# Patient Record
Sex: Female | Born: 1940 | Race: Black or African American | Hispanic: No | Marital: Single | Smoking: Former smoker
Health system: Southern US, Community
[De-identification: ages and names within clinical notes are randomized; demographics above are authoritative.]

## PROBLEM LIST (undated history)

## (undated) DIAGNOSIS — N189 Chronic kidney disease, unspecified: Secondary | ICD-10-CM

## (undated) DIAGNOSIS — I509 Heart failure, unspecified: Secondary | ICD-10-CM

## (undated) DIAGNOSIS — I10 Essential (primary) hypertension: Secondary | ICD-10-CM

## (undated) DIAGNOSIS — R0989 Other specified symptoms and signs involving the circulatory and respiratory systems: Secondary | ICD-10-CM

## (undated) DIAGNOSIS — J189 Pneumonia, unspecified organism: Secondary | ICD-10-CM

## (undated) DIAGNOSIS — E785 Hyperlipidemia, unspecified: Secondary | ICD-10-CM

## (undated) DIAGNOSIS — E559 Vitamin D deficiency, unspecified: Secondary | ICD-10-CM

## (undated) DIAGNOSIS — I350 Nonrheumatic aortic (valve) stenosis: Secondary | ICD-10-CM

## (undated) DIAGNOSIS — K219 Gastro-esophageal reflux disease without esophagitis: Secondary | ICD-10-CM

## (undated) DIAGNOSIS — R011 Cardiac murmur, unspecified: Secondary | ICD-10-CM

## (undated) DIAGNOSIS — E119 Type 2 diabetes mellitus without complications: Secondary | ICD-10-CM

## (undated) HISTORY — PX: CORONARY ANGIOPLASTY: SHX604

## (undated) HISTORY — PX: COLONOSCOPY: SHX174

## (undated) HISTORY — DX: Chronic kidney disease, unspecified: N18.9

## (undated) HISTORY — DX: Heart failure, unspecified: I50.9

## (undated) HISTORY — PX: OTHER SURGICAL HISTORY: SHX169

---

## 2004-11-13 ENCOUNTER — Ambulatory Visit: Payer: Self-pay | Admitting: *Deleted

## 2005-01-19 ENCOUNTER — Emergency Department: Payer: Self-pay | Admitting: Emergency Medicine

## 2006-05-10 ENCOUNTER — Ambulatory Visit: Payer: Self-pay | Admitting: *Deleted

## 2006-09-12 ENCOUNTER — Ambulatory Visit: Payer: Self-pay | Admitting: *Deleted

## 2006-12-17 ENCOUNTER — Emergency Department: Payer: Self-pay | Admitting: Emergency Medicine

## 2006-12-22 ENCOUNTER — Ambulatory Visit: Payer: Self-pay | Admitting: *Deleted

## 2007-06-07 ENCOUNTER — Ambulatory Visit: Payer: Self-pay | Admitting: Gastroenterology

## 2007-08-29 ENCOUNTER — Ambulatory Visit: Payer: Self-pay | Admitting: *Deleted

## 2008-10-01 ENCOUNTER — Ambulatory Visit: Payer: Self-pay | Admitting: Family Medicine

## 2009-10-02 ENCOUNTER — Ambulatory Visit: Payer: Self-pay | Admitting: Family Medicine

## 2013-05-25 ENCOUNTER — Emergency Department: Payer: Self-pay | Admitting: Emergency Medicine

## 2014-07-24 ENCOUNTER — Ambulatory Visit: Payer: Self-pay | Admitting: Family Medicine

## 2015-02-16 ENCOUNTER — Emergency Department: Payer: Self-pay | Admitting: Emergency Medicine

## 2015-06-24 ENCOUNTER — Other Ambulatory Visit: Payer: Self-pay | Admitting: Nurse Practitioner

## 2015-06-24 DIAGNOSIS — Z1231 Encounter for screening mammogram for malignant neoplasm of breast: Secondary | ICD-10-CM

## 2015-06-25 ENCOUNTER — Other Ambulatory Visit: Payer: Self-pay | Admitting: Nurse Practitioner

## 2015-06-25 ENCOUNTER — Ambulatory Visit
Admission: RE | Admit: 2015-06-25 | Discharge: 2015-06-25 | Disposition: A | Discharge status: Home / Self Care | Payer: Medicare HMO | Source: Ambulatory Visit | Attending: Nurse Practitioner | Admitting: Nurse Practitioner

## 2015-06-25 DIAGNOSIS — R0989 Other specified symptoms and signs involving the circulatory and respiratory systems: Secondary | ICD-10-CM

## 2015-06-25 DIAGNOSIS — Z1231 Encounter for screening mammogram for malignant neoplasm of breast: Secondary | ICD-10-CM | POA: Diagnosis not present

## 2015-07-01 ENCOUNTER — Ambulatory Visit
Admission: RE | Admit: 2015-07-01 | Discharge: 2015-07-01 | Disposition: A | Discharge status: Home / Self Care | Payer: Medicare HMO | Source: Ambulatory Visit | Attending: Nurse Practitioner | Admitting: Nurse Practitioner

## 2015-07-01 DIAGNOSIS — R0989 Other specified symptoms and signs involving the circulatory and respiratory systems: Secondary | ICD-10-CM | POA: Diagnosis present

## 2015-07-01 DIAGNOSIS — I6523 Occlusion and stenosis of bilateral carotid arteries: Secondary | ICD-10-CM | POA: Insufficient documentation

## 2015-07-24 DIAGNOSIS — K219 Gastro-esophageal reflux disease without esophagitis: Secondary | ICD-10-CM | POA: Insufficient documentation

## 2015-07-24 DIAGNOSIS — E785 Hyperlipidemia, unspecified: Secondary | ICD-10-CM | POA: Insufficient documentation

## 2015-07-24 DIAGNOSIS — E1129 Type 2 diabetes mellitus with other diabetic kidney complication: Secondary | ICD-10-CM | POA: Insufficient documentation

## 2015-07-24 DIAGNOSIS — E1165 Type 2 diabetes mellitus with hyperglycemia: Secondary | ICD-10-CM | POA: Insufficient documentation

## 2015-08-01 ENCOUNTER — Encounter: Payer: Self-pay | Admitting: *Deleted

## 2015-08-04 ENCOUNTER — Encounter: Admission: RE | Payer: Self-pay | Source: Ambulatory Visit

## 2015-08-04 ENCOUNTER — Encounter: Payer: Self-pay | Admitting: Registered Nurse

## 2015-08-04 ENCOUNTER — Ambulatory Visit: Admission: RE | Admit: 2015-08-04 | Payer: Medicare HMO | Source: Ambulatory Visit | Admitting: Gastroenterology

## 2015-08-04 HISTORY — DX: Type 2 diabetes mellitus without complications: E11.9

## 2015-08-04 HISTORY — DX: Gastro-esophageal reflux disease without esophagitis: K21.9

## 2015-08-04 HISTORY — DX: Essential (primary) hypertension: I10

## 2015-08-04 HISTORY — DX: Hyperlipidemia, unspecified: E78.5

## 2015-08-04 HISTORY — DX: Vitamin D deficiency, unspecified: E55.9

## 2015-08-04 SURGERY — COLONOSCOPY WITH PROPOFOL
Anesthesia: General

## 2017-01-19 ENCOUNTER — Other Ambulatory Visit: Payer: Self-pay | Admitting: Nurse Practitioner

## 2017-01-19 DIAGNOSIS — Z13820 Encounter for screening for osteoporosis: Secondary | ICD-10-CM

## 2017-01-27 ENCOUNTER — Ambulatory Visit
Admission: RE | Admit: 2017-01-27 | Discharge: 2017-01-27 | Disposition: A | Discharge status: Home / Self Care | Payer: Medicare HMO | Source: Ambulatory Visit | Attending: Nurse Practitioner | Admitting: Nurse Practitioner

## 2017-01-27 DIAGNOSIS — Z13820 Encounter for screening for osteoporosis: Secondary | ICD-10-CM | POA: Insufficient documentation

## 2018-08-05 ENCOUNTER — Emergency Department: Payer: Medicare HMO

## 2018-08-05 ENCOUNTER — Inpatient Hospital Stay
Admission: EM | Admit: 2018-08-05 | Discharge: 2018-08-11 | DRG: 871 | Disposition: A | Discharge status: Home / Self Care | Payer: Medicare HMO | Attending: Internal Medicine | Admitting: Internal Medicine

## 2018-08-05 ENCOUNTER — Inpatient Hospital Stay: Payer: Medicare HMO

## 2018-08-05 ENCOUNTER — Other Ambulatory Visit: Payer: Self-pay

## 2018-08-05 DIAGNOSIS — B9620 Unspecified Escherichia coli [E. coli] as the cause of diseases classified elsewhere: Secondary | ICD-10-CM | POA: Diagnosis present

## 2018-08-05 DIAGNOSIS — F17210 Nicotine dependence, cigarettes, uncomplicated: Secondary | ICD-10-CM | POA: Diagnosis present

## 2018-08-05 DIAGNOSIS — J9622 Acute and chronic respiratory failure with hypercapnia: Secondary | ICD-10-CM | POA: Diagnosis present

## 2018-08-05 DIAGNOSIS — E876 Hypokalemia: Secondary | ICD-10-CM | POA: Diagnosis present

## 2018-08-05 DIAGNOSIS — N390 Urinary tract infection, site not specified: Secondary | ICD-10-CM | POA: Diagnosis present

## 2018-08-05 DIAGNOSIS — J181 Lobar pneumonia, unspecified organism: Secondary | ICD-10-CM

## 2018-08-05 DIAGNOSIS — I110 Hypertensive heart disease with heart failure: Secondary | ICD-10-CM | POA: Diagnosis present

## 2018-08-05 DIAGNOSIS — Z794 Long term (current) use of insulin: Secondary | ICD-10-CM | POA: Diagnosis not present

## 2018-08-05 DIAGNOSIS — A419 Sepsis, unspecified organism: Principal | ICD-10-CM

## 2018-08-05 DIAGNOSIS — J9621 Acute and chronic respiratory failure with hypoxia: Secondary | ICD-10-CM | POA: Diagnosis present

## 2018-08-05 DIAGNOSIS — Z7982 Long term (current) use of aspirin: Secondary | ICD-10-CM | POA: Diagnosis not present

## 2018-08-05 DIAGNOSIS — E872 Acidosis: Secondary | ICD-10-CM | POA: Diagnosis present

## 2018-08-05 DIAGNOSIS — E785 Hyperlipidemia, unspecified: Secondary | ICD-10-CM | POA: Diagnosis present

## 2018-08-05 DIAGNOSIS — Z79899 Other long term (current) drug therapy: Secondary | ICD-10-CM

## 2018-08-05 DIAGNOSIS — K219 Gastro-esophageal reflux disease without esophagitis: Secondary | ICD-10-CM | POA: Diagnosis present

## 2018-08-05 DIAGNOSIS — K8020 Calculus of gallbladder without cholecystitis without obstruction: Secondary | ICD-10-CM | POA: Diagnosis present

## 2018-08-05 DIAGNOSIS — J810 Acute pulmonary edema: Secondary | ICD-10-CM | POA: Diagnosis not present

## 2018-08-05 DIAGNOSIS — J9601 Acute respiratory failure with hypoxia: Secondary | ICD-10-CM

## 2018-08-05 DIAGNOSIS — J690 Pneumonitis due to inhalation of food and vomit: Secondary | ICD-10-CM | POA: Diagnosis present

## 2018-08-05 DIAGNOSIS — R0602 Shortness of breath: Secondary | ICD-10-CM | POA: Diagnosis present

## 2018-08-05 DIAGNOSIS — Z4659 Encounter for fitting and adjustment of other gastrointestinal appliance and device: Secondary | ICD-10-CM

## 2018-08-05 DIAGNOSIS — R1110 Vomiting, unspecified: Secondary | ICD-10-CM

## 2018-08-05 DIAGNOSIS — Z978 Presence of other specified devices: Secondary | ICD-10-CM

## 2018-08-05 DIAGNOSIS — N179 Acute kidney failure, unspecified: Secondary | ICD-10-CM | POA: Diagnosis present

## 2018-08-05 DIAGNOSIS — J441 Chronic obstructive pulmonary disease with (acute) exacerbation: Secondary | ICD-10-CM | POA: Diagnosis present

## 2018-08-05 DIAGNOSIS — I5033 Acute on chronic diastolic (congestive) heart failure: Secondary | ICD-10-CM | POA: Diagnosis present

## 2018-08-05 DIAGNOSIS — J189 Pneumonia, unspecified organism: Secondary | ICD-10-CM

## 2018-08-05 DIAGNOSIS — I248 Other forms of acute ischemic heart disease: Secondary | ICD-10-CM | POA: Diagnosis present

## 2018-08-05 DIAGNOSIS — D649 Anemia, unspecified: Secondary | ICD-10-CM | POA: Diagnosis present

## 2018-08-05 DIAGNOSIS — R7981 Abnormal blood-gas level: Secondary | ICD-10-CM

## 2018-08-05 DIAGNOSIS — E119 Type 2 diabetes mellitus without complications: Secondary | ICD-10-CM | POA: Diagnosis present

## 2018-08-05 DIAGNOSIS — K922 Gastrointestinal hemorrhage, unspecified: Secondary | ICD-10-CM | POA: Diagnosis present

## 2018-08-05 DIAGNOSIS — J9600 Acute respiratory failure, unspecified whether with hypoxia or hypercapnia: Secondary | ICD-10-CM

## 2018-08-05 DIAGNOSIS — R6521 Severe sepsis with septic shock: Secondary | ICD-10-CM | POA: Diagnosis present

## 2018-08-05 DIAGNOSIS — Z95828 Presence of other vascular implants and grafts: Secondary | ICD-10-CM

## 2018-08-05 DIAGNOSIS — Z01818 Encounter for other preprocedural examination: Secondary | ICD-10-CM

## 2018-08-05 DIAGNOSIS — J9690 Respiratory failure, unspecified, unspecified whether with hypoxia or hypercapnia: Secondary | ICD-10-CM

## 2018-08-05 LAB — BLOOD GAS, ARTERIAL
Acid-base deficit: 16.6 mmol/L — ABNORMAL HIGH (ref 0.0–2.0)
Acid-base deficit: 13.7 mmol/L — ABNORMAL HIGH (ref 0.0–2.0)
Bicarbonate: 16 mmol/L — ABNORMAL LOW (ref 20.0–28.0)
Bicarbonate: 15.6 mmol/L — ABNORMAL LOW (ref 20.0–28.0)
Delivery systems: POSITIVE
Expiratory PAP: 5
FIO2: 100
FIO2: 50
Inspiratory PAP: 10
MECHVT: 450 mL
O2 Saturation: 92.1 %
O2 Saturation: 92.7 %
PEEP: 5 cmH2O
Patient temperature: 37
Patient temperature: 35.4
RATE: 20 resp/min
pCO2 arterial: 68 mmHg (ref 32.0–48.0)
pCO2 arterial: 49 mmHg — ABNORMAL HIGH (ref 32.0–48.0)
pH, Arterial: 6.98 — CL (ref 7.350–7.450)
pH, Arterial: 7.13 — CL (ref 7.350–7.450)
pO2, Arterial: 97 mmHg (ref 83.0–108.0)
pO2, Arterial: 79 mmHg — ABNORMAL LOW (ref 83.0–108.0)

## 2018-08-05 LAB — URINALYSIS, COMPLETE (UACMP) WITH MICROSCOPIC
Bilirubin Urine: NEGATIVE
Glucose, UA: 50 mg/dL — AB
Hgb urine dipstick: NEGATIVE
Ketones, ur: NEGATIVE mg/dL
Leukocytes, UA: NEGATIVE
Nitrite: NEGATIVE
Protein, ur: >=300 mg/dL — AB
Specific Gravity, Urine: 1.007 (ref 1.005–1.030)
pH: 6 (ref 5.0–8.0)

## 2018-08-05 LAB — CBC WITH DIFFERENTIAL/PLATELET
Basophils Absolute: 0.2 10*3/uL — ABNORMAL HIGH (ref 0–0.1)
Basophils Relative: 1 %
Eosinophils Absolute: 0.7 10*3/uL (ref 0–0.7)
Eosinophils Relative: 3 %
HCT: 43.2 % (ref 35.0–47.0)
Hemoglobin: 14.3 g/dL (ref 12.0–16.0)
Lymphocytes Relative: 50 %
Lymphs Abs: 11.3 10*3/uL — ABNORMAL HIGH (ref 1.0–3.6)
MCH: 28 pg (ref 26.0–34.0)
MCHC: 33.1 g/dL (ref 32.0–36.0)
MCV: 84.6 fL (ref 80.0–100.0)
Monocytes Absolute: 1.4 10*3/uL — ABNORMAL HIGH (ref 0.2–0.9)
Monocytes Relative: 6 %
Neutro Abs: 9 10*3/uL — ABNORMAL HIGH (ref 1.4–6.5)
Neutrophils Relative %: 40 %
Platelets: 461 10*3/uL — ABNORMAL HIGH (ref 150–440)
RBC: 5.11 MIL/uL (ref 3.80–5.20)
RDW: 15.3 % — ABNORMAL HIGH (ref 11.5–14.5)
WBC: 22.6 10*3/uL — ABNORMAL HIGH (ref 3.6–11.0)

## 2018-08-05 LAB — COMPREHENSIVE METABOLIC PANEL
ALT: 22 U/L (ref 0–44)
AST: 43 U/L — ABNORMAL HIGH (ref 15–41)
Albumin: 3.5 g/dL (ref 3.5–5.0)
Alkaline Phosphatase: 64 U/L (ref 38–126)
Anion gap: 14 (ref 5–15)
BUN: 14 mg/dL (ref 8–23)
CO2: 19 mmol/L — ABNORMAL LOW (ref 22–32)
Calcium: 9 mg/dL (ref 8.9–10.3)
Chloride: 104 mmol/L (ref 98–111)
Creatinine, Ser: 1.16 mg/dL — ABNORMAL HIGH (ref 0.44–1.00)
GFR calc Af Amer: 51 mL/min — ABNORMAL LOW (ref >60)
GFR calc non Af Amer: 44 mL/min — ABNORMAL LOW (ref >60)
Glucose, Bld: 405 mg/dL — ABNORMAL HIGH (ref 70–99)
Potassium: 3.7 mmol/L (ref 3.5–5.1)
Sodium: 137 mmol/L (ref 135–145)
Total Bilirubin: 0.7 mg/dL (ref 0.3–1.2)
Total Protein: 7.5 g/dL (ref 6.5–8.1)

## 2018-08-05 LAB — PROCALCITONIN: Procalcitonin: <0.1 ng/mL

## 2018-08-05 LAB — GLUCOSE, CAPILLARY: Glucose-Capillary: 313 mg/dL — ABNORMAL HIGH (ref 70–99)

## 2018-08-05 LAB — LIPASE, BLOOD: Lipase: 40 U/L (ref 11–51)

## 2018-08-05 LAB — PROTIME-INR
INR: 1.03
Prothrombin Time: 13.4 seconds (ref 11.4–15.2)

## 2018-08-05 LAB — MRSA PCR SCREENING: MRSA by PCR: NEGATIVE

## 2018-08-05 LAB — LACTIC ACID, PLASMA
Lactic Acid, Venous: 7.2 mmol/L (ref 0.5–1.9)
Lactic Acid, Venous: 3.4 mmol/L (ref 0.5–1.9)

## 2018-08-05 LAB — TROPONIN I
Troponin I: 0.04 ng/mL (ref <0.03)
Troponin I: 0.27 ng/mL (ref <0.03)
Troponin I: 0.73 ng/mL (ref <0.03)

## 2018-08-05 LAB — INFLUENZA PANEL BY PCR (TYPE A & B)
Influenza A By PCR: NEGATIVE
Influenza B By PCR: NEGATIVE

## 2018-08-05 LAB — APTT: aPTT: 31 seconds (ref 24–36)

## 2018-08-05 MED ORDER — PANTOPRAZOLE SODIUM 40 MG PO TBEC
40.0000 mg | DELAYED_RELEASE_TABLET | Freq: Every day | ORAL | Status: DC
  Filled 2018-08-05: qty 1

## 2018-08-05 MED ORDER — LORATADINE 10 MG PO TABS
10.0000 mg | ORAL_TABLET | Freq: Every day | ORAL | Status: DC
Start: 2018-08-05 — End: 2018-08-07
  Filled 2018-08-05 (×2): qty 1

## 2018-08-05 MED ORDER — SODIUM BICARBONATE 8.4 % IV SOLN
25.0000 meq | Freq: Once | INTRAVENOUS | Status: AC
  Administered 2018-08-05: 25 meq via INTRAVENOUS
  Filled 2018-08-05: qty 50

## 2018-08-05 MED ORDER — VANCOMYCIN HCL IN DEXTROSE 1-5 GM/200ML-% IV SOLN
1000.0000 mg | Freq: Once | INTRAVENOUS | Status: AC
  Administered 2018-08-05: 1000 mg via INTRAVENOUS
  Filled 2018-08-05: qty 200

## 2018-08-05 MED ORDER — BUDESONIDE 0.5 MG/2ML IN SUSP
0.5000 mg | Freq: Two times a day (BID) | RESPIRATORY_TRACT | Status: DC
  Administered 2018-08-05 – 2018-08-07 (×4): 0.5 mg via RESPIRATORY_TRACT
  Filled 2018-08-05 (×4): qty 2

## 2018-08-05 MED ORDER — MAGNESIUM SULFATE 2 GM/50ML IV SOLN
2.0000 g | INTRAVENOUS | Status: AC
  Administered 2018-08-05: 2 g via INTRAVENOUS

## 2018-08-05 MED ORDER — METHYLPREDNISOLONE SODIUM SUCC 125 MG IJ SOLR
125.0000 mg | Freq: Once | INTRAMUSCULAR | Status: AC
  Administered 2018-08-05: 125 mg via INTRAVENOUS

## 2018-08-05 MED ORDER — PIPERACILLIN-TAZOBACTAM 3.375 G IVPB 30 MIN
3.3750 g | Freq: Once | INTRAVENOUS | Status: AC
  Administered 2018-08-05: 3.375 g via INTRAVENOUS
  Filled 2018-08-05: qty 50

## 2018-08-05 MED ORDER — PHENYLEPHRINE HCL-NACL 10-0.9 MG/250ML-% IV SOLN
0.0000 ug/min | INTRAVENOUS | Status: DC
  Administered 2018-08-05 – 2018-08-06 (×2): 20 ug/min via INTRAVENOUS
  Filled 2018-08-05 (×4): qty 250

## 2018-08-05 MED ORDER — METHYLPREDNISOLONE SODIUM SUCC 40 MG IJ SOLR
40.0000 mg | Freq: Three times a day (TID) | INTRAMUSCULAR | Status: DC
  Administered 2018-08-05 – 2018-08-06 (×2): 40 mg via INTRAVENOUS
  Filled 2018-08-05 (×2): qty 1

## 2018-08-05 MED ORDER — ALBUTEROL SULFATE (2.5 MG/3ML) 0.083% IN NEBU
5.0000 mg | INHALATION_SOLUTION | Freq: Once | RESPIRATORY_TRACT | Status: AC
  Administered 2018-08-05: 5 mg via RESPIRATORY_TRACT

## 2018-08-05 MED ORDER — SODIUM CHLORIDE 0.9 % IV BOLUS (SEPSIS)
1000.0000 mL | Freq: Once | INTRAVENOUS | Status: AC
  Administered 2018-08-05: 1000 mL via INTRAVENOUS

## 2018-08-05 MED ORDER — METHYLPREDNISOLONE SODIUM SUCC 40 MG IJ SOLR: 40.0000 mg | Freq: Four times a day (QID) | INTRAMUSCULAR | Status: DC

## 2018-08-05 MED ORDER — ORAL CARE MOUTH RINSE
15.0000 mL | OROMUCOSAL | Status: DC
  Administered 2018-08-05 – 2018-08-07 (×15): 15 mL via OROMUCOSAL

## 2018-08-05 MED ORDER — PROPOFOL 1000 MG/100ML IV EMUL
5.0000 ug/kg/min | INTRAVENOUS | Status: DC
  Administered 2018-08-05 – 2018-08-06 (×3): 30 ug/kg/min via INTRAVENOUS
  Administered 2018-08-06: 40 ug/kg/min via INTRAVENOUS
  Filled 2018-08-05 (×4): qty 100

## 2018-08-05 MED ORDER — FENTANYL CITRATE (PF) 100 MCG/2ML IJ SOLN
100.0000 ug | Freq: Once | INTRAMUSCULAR | Status: AC
  Administered 2018-08-05: 100 ug via INTRAVENOUS
  Filled 2018-08-05: qty 2

## 2018-08-05 MED ORDER — ATORVASTATIN CALCIUM 20 MG PO TABS
40.0000 mg | ORAL_TABLET | Freq: Every day | ORAL | Status: DC
  Filled 2018-08-05 (×2): qty 2

## 2018-08-05 MED ORDER — FENTANYL 2500MCG IN NS 250ML (10MCG/ML) PREMIX INFUSION
0.0000 ug/h | INTRAVENOUS | Status: DC
  Administered 2018-08-05: 50 ug/h via INTRAVENOUS
  Administered 2018-08-06: 150 ug/h via INTRAVENOUS
  Filled 2018-08-05 (×3): qty 250

## 2018-08-05 MED ORDER — ASPIRIN EC 81 MG PO TBEC
81.0000 mg | DELAYED_RELEASE_TABLET | Freq: Every day | ORAL | Status: DC
  Filled 2018-08-05: qty 1

## 2018-08-05 MED ORDER — PIPERACILLIN-TAZOBACTAM 3.375 G IVPB
3.3750 g | Freq: Three times a day (TID) | INTRAVENOUS | Status: DC
  Administered 2018-08-05 – 2018-08-08 (×10): 3.375 g via INTRAVENOUS
  Filled 2018-08-05 (×7): qty 50

## 2018-08-05 MED ORDER — IOPAMIDOL (ISOVUE-370) INJECTION 76%
75.0000 mL | Freq: Once | INTRAVENOUS | Status: AC | PRN
  Administered 2018-08-05: 75 mL via INTRAVENOUS

## 2018-08-05 MED ORDER — ACETAMINOPHEN 650 MG RE SUPP: 650.0000 mg | Freq: Four times a day (QID) | RECTAL | Status: DC | PRN

## 2018-08-05 MED ORDER — MAGNESIUM SULFATE 2 GM/50ML IV SOLN
INTRAVENOUS | Status: AC
  Administered 2018-08-05: 2 g via INTRAVENOUS
  Filled 2018-08-05: qty 50

## 2018-08-05 MED ORDER — ENOXAPARIN SODIUM 40 MG/0.4ML ~~LOC~~ SOLN
40.0000 mg | SUBCUTANEOUS | Status: DC
  Administered 2018-08-05: 40 mg via SUBCUTANEOUS
  Filled 2018-08-05: qty 0.4

## 2018-08-05 MED ORDER — SODIUM CHLORIDE 0.9 % IV SOLN
500.0000 mg | INTRAVENOUS | Status: DC
  Administered 2018-08-05 – 2018-08-06 (×2): 500 mg via INTRAVENOUS
  Filled 2018-08-05 (×3): qty 500

## 2018-08-05 MED ORDER — ONDANSETRON HCL 4 MG/2ML IJ SOLN
4.0000 mg | Freq: Four times a day (QID) | INTRAMUSCULAR | Status: DC | PRN
  Administered 2018-08-07 – 2018-08-11 (×2): 4 mg via INTRAVENOUS
  Filled 2018-08-05 (×3): qty 2

## 2018-08-05 MED ORDER — LACTATED RINGERS IV SOLN
INTRAVENOUS | Status: DC
  Administered 2018-08-05: 19:00:00 via INTRAVENOUS

## 2018-08-05 MED ORDER — ONDANSETRON HCL 4 MG PO TABS: 4.0000 mg | ORAL_TABLET | Freq: Four times a day (QID) | ORAL | Status: DC | PRN

## 2018-08-05 MED ORDER — VANCOMYCIN HCL 10 G IV SOLR
1250.0000 mg | INTRAVENOUS | Status: DC
  Administered 2018-08-05: 1250 mg via INTRAVENOUS
  Filled 2018-08-05 (×2): qty 1250

## 2018-08-05 MED ORDER — IPRATROPIUM-ALBUTEROL 0.5-2.5 (3) MG/3ML IN SOLN
3.0000 mL | Freq: Four times a day (QID) | RESPIRATORY_TRACT | Status: DC
  Administered 2018-08-05 – 2018-08-09 (×16): 3 mL via RESPIRATORY_TRACT
  Filled 2018-08-05 (×16): qty 3

## 2018-08-05 MED ORDER — CHLORHEXIDINE GLUCONATE 0.12% ORAL RINSE (MEDLINE KIT)
15.0000 mL | Freq: Two times a day (BID) | OROMUCOSAL | Status: DC
  Administered 2018-08-05 – 2018-08-09 (×8): 15 mL via OROMUCOSAL

## 2018-08-05 MED ORDER — ACETAMINOPHEN 325 MG PO TABS: 650.0000 mg | ORAL_TABLET | Freq: Four times a day (QID) | ORAL | Status: DC | PRN

## 2018-08-05 MED ORDER — PROPOFOL 10 MG/ML IV BOLUS
30.0000 mg | Freq: Once | INTRAVENOUS | Status: AC
  Administered 2018-08-05: 30 mg via INTRAVENOUS
  Filled 2018-08-05: qty 20

## 2018-08-05 NOTE — ED Notes (Signed)
Lac 7.2 Critical Value Trop 0.03

## 2018-08-05 NOTE — H&P (Signed)
[facility] [place] NAME: Brittney [name]    MR#:  [id number]  DATE OF BIRTH:  1941/03/30  DATE OF ADMISSION:  08/05/2018  PRIMARY CARE PHYSICIAN:  [facility]  REQUESTING/REFERRING PHYSICIAN: Dr. Carrie Mew  CHIEF COMPLAINT:   Chief Complaint  Patient presents with  . Shortness of Breath    HISTORY OF PRESENT ILLNESS:  Brittney [name]  is a 77 y.o. female with a known history of diabetes, hypertension, hyperlipidemia, GERD, ongoing tobacco abuse, COPD who presents to the hospital due to shortness of breath and noted to be in sepsis secondary to pneumonia.  Patient is presently intubated and sedated and therefore most of the history obtained from the family at bedside.  As per the family patient was in his usual state of health and talking to 1 of her daughters on the phone and felt fine but then told her daughter please call 911 shortly thereafter, when the daughter arrived to see her as she lived only 2 doors down she noticed the mother to be diaphoretic, having significant shortness of breath and therefore called EMS.  Upon EMS arrival patient was noted to be in severe respiratory distress and placed on BiPAP and rushed to the emergency room.  In the ER patient was noted to be in acute on chronic respiratory failure with hypoxia and hypercapnia and despite being on BiPAP continued to not do well and therefore was intubated.  Patient's CT findings were suggestive of multifocal pneumonia.  Patient was also noted to be in sepsis secondary to pneumonia with a elevated lactic acid and leukocytosis.  Hospitalist services were therefore contacted for admission.  As per the family patient had not complained of any chest pains, shortness of breath, cough, fever, chills, nausea vomiting or any other associated symptoms prior to her symptoms that began this afternoon.  PAST MEDICAL HISTORY:   Past Medical History:  Diagnosis Date  . Diabetes  mellitus without complication ([place])   . GERD (gastroesophageal reflux disease)   . Hyperlipidemia   . Hypertension   . Vitamin D deficiency     PAST SURGICAL HISTORY:   Past Surgical History:  Procedure Laterality Date  . COLONOSCOPY      SOCIAL HISTORY:   Social History   Tobacco Use  . Smoking status: Current Every Day Smoker    Packs/day: 1.00    Years: 40.00    Pack years: 40.00    Types: Cigarettes  . Smokeless tobacco: Never Used  Substance Use Topics  . Alcohol use: No    FAMILY HISTORY:   Family History  Problem Relation Age of Onset  . Diabetes Neg Hx   . Hypertension Neg Hx     DRUG ALLERGIES:   Allergies  Allergen Reactions  . Accupril [Quinapril Hcl]     REVIEW OF SYSTEMS:   Review of Systems  Unable to perform ROS: Intubated    MEDICATIONS AT HOME:   Prior to Admission medications   Medication Sig Start Date End Date Taking? Authorizing Provider  amLODipine (NORVASC) 10 MG tablet Take 10 mg by mouth daily.   Yes [provider]  aspirin 81 MG tablet Take 81 mg by mouth daily.   Yes [provider]  atorvastatin (LIPITOR) 40 MG tablet Take 1 tablet by mouth daily. 07/04/18  Yes [provider]  cetirizine (ZYRTEC) 10 MG tablet Take 10 mg by mouth daily.    Yes [provider]  glipiZIDE (GLUCOTROL XL)  10 MG 24 hr tablet Take 10 mg by mouth 2 (two) times daily.   Yes [provider]  Liraglutide (VICTOZA) 18 MG/3ML SOPN Inject 1.8 mg into the skin every morning.   Yes [provider]  losartan (COZAAR) 100 MG tablet Take 100 mg by mouth daily.   Yes [provider]  metFORMIN (GLUCOPHAGE) 1000 MG tablet Take 1,000 mg by mouth 2 (two) times daily with a meal.   Yes [provider]  omeprazole (PRILOSEC) 20 MG capsule Take 20 mg by mouth daily.   Yes [provider]  ranitidine (ZANTAC) 150 MG tablet Take 1 tablet by mouth 2 (two) times daily. 05/06/18  Yes [provider]  sitaGLIPtin (JANUVIA) 100 MG tablet Take 100 mg by mouth daily.   Yes [provider]  albuterol (PROVENTIL HFA;VENTOLIN HFA) 108 (90 BASE) MCG/ACT inhaler Inhale 2 puffs into the lungs every 6 (six) hours as needed for wheezing or shortness of breath.     [provider]      VITAL SIGNS:  Blood pressure (!) 175/71, pulse (!) 117, temperature (!) 95.8 F (35.4 C), resp. rate (!) 22, height 5\' 5"  (1.651 m), weight 83.9 kg, SpO2 96 %.  PHYSICAL EXAMINATION:  Physical Exam  GENERAL:  77 y.o.-year-old critically ill patient lying in the bed sedated and intubated EYES: Pupils equal, round, reactive to light and accommodation. No scleral icterus. Extraocular muscles intact.  HEENT: Head atraumatic, normocephalic. ET and OG tube in place.  NECK:  Supple, no jugular venous distention. No thyroid enlargement, no tenderness.  LUNGS: Normal breath sounds bilaterally, no wheezing, rales, rhonchi. No use of accessory muscles of respiration.  CARDIOVASCULAR: S1, S2 RRR. No murmurs, rubs, gallops, clicks.  ABDOMEN: Soft, nontender, nondistended. Bowel sounds present. No organomegaly or mass.  EXTREMITIES: No pedal edema, cyanosis, or clubbing. + 2 pedal & radial pulses b/l.   NEUROLOGIC: Sedated & Intubated.   PSYCHIATRIC: Sedated & Intubated.  SKIN: No obvious rash, lesion, or ulcer.   LABORATORY PANEL:   CBC Recent Labs  Lab 08/05/18 1355  WBC 22.6*  HGB 14.3  HCT 43.2  PLT 461*   ------------------------------------------------------------------------------------------------------------------  Chemistries  Recent Labs  Lab 08/05/18 1355  NA 137  K 3.7  CL 104  CO2 19*  GLUCOSE 405*  BUN 14  CREATININE 1.16*  CALCIUM 9.0  AST 43*  ALT 22  ALKPHOS 64  BILITOT 0.7   ------------------------------------------------------------------------------------------------------------------  Cardiac Enzymes Recent Labs  Lab 08/05/18 1355  TROPONINI  0.04*   ------------------------------------------------------------------------------------------------------------------  RADIOLOGY:  Ct Angio Chest Pe W And/or Wo Contrast  Result Date: 08/05/2018 CLINICAL DATA:  Respiratory distress, diaphoresis and lethargy. Intubated. Acute, generalized abdominal pain. EXAM: CT ANGIOGRAPHY CHEST CT ABDOMEN AND PELVIS WITH CONTRAST TECHNIQUE: Multidetector CT imaging of the chest was performed using the standard protocol during bolus administration of intravenous contrast. Multiplanar CT image reconstructions and MIPs were obtained to evaluate the vascular anatomy. Multidetector CT imaging of the abdomen and pelvis was performed using the standard protocol during bolus administration of intravenous contrast. CONTRAST:  25mL ISOVUE-370 IOPAMIDOL (ISOVUE-370) INJECTION 76% COMPARISON:  None. FINDINGS: CTA CHEST FINDINGS Cardiovascular: Normally opacified pulmonary arteries with no pulmonary arterial filling defects seen. Atheromatous calcifications, including the coronary arteries and aorta. Borderline enlarged heart. No pericardial effusion. Mediastinum/Nodes: Mildly prominent precarinal node with a short axis diameter of 10 mm on image number 40 series 4. Mildly enlarged right hilar nodes, the largest with a short axis diameter of  11 mm on image number 54 series 4. Enlarged left hilar nodes, the largest with a short axis diameter of 10 mm on image number 52 series 4. Endotracheal 2 tip 2.7 cm above the carina. Nasogastric tube extending into the stomach. Lungs/Pleura: Dense, patchy and confluent airspace opacity in both lower lobes. Diffusely prominent interstitial markings with septal edema. Small bilateral pleural effusions. Musculoskeletal: Thoracic and lower cervical spine degenerative changes. Review of the MIP images confirms the above findings. CT ABDOMEN and PELVIS FINDINGS Hepatobiliary: Multiple gallstones in the gallbladder measuring up to 2.0 cm in maximum  diameter each. No gallbladder wall thickening or pericholecystic fluid. Normal appearing liver. Pancreas: Unremarkable. No pancreatic ductal dilatation or surrounding inflammatory changes. Spleen: Normal in size without focal abnormality. Adrenals/Urinary Tract: Small bilateral renal cysts. Normal appearing adrenal glands and ureters. Foley catheter in the urinary bladder with associated air in the bladder. Stomach/Bowel: Single distal descending colon diverticulum. Nasogastric tube tip in the mid to proximal stomach. Unremarkable small bowel. No evidence of appendicitis. Vascular/Lymphatic: Atheromatous arterial calcifications without aneurysm. No enlarged lymph nodes. Reproductive: Normal sized uterus with multiple small peripheral calcifications. No adnexal mass. Other: No abdominal wall hernia or abnormality. No abdominopelvic ascites. Musculoskeletal: Lumbar and lower thoracic spine degenerative changes. Review of the MIP images confirms the above findings. IMPRESSION: 1. No pulmonary emboli. 2. Dense, patchy and confluent airspace opacity in both lower lobes. This is most likely due to pneumonia. Patchy atelectasis is less likely. 3. Borderline cardiomegaly with changes of acute congestive heart failure. 4. Mild mediastinal and bilateral hilar adenopathy, most likely reactive. 5. Cholelithiasis without evidence cholecystitis. 6.  Calcific coronary artery and aortic atherosclerosis. Aortic Atherosclerosis (ICD10-I70.0). Electronically Signed   By: Claudie Revering M.D.   On: 08/05/2018 15:40   Ct Abdomen Pelvis W Contrast  Result Date: 08/05/2018 CLINICAL DATA:  Respiratory distress, diaphoresis and lethargy. Intubated. Acute, generalized abdominal pain. EXAM: CT ANGIOGRAPHY CHEST CT ABDOMEN AND PELVIS WITH CONTRAST TECHNIQUE: Multidetector CT imaging of the chest was performed using the standard protocol during bolus administration of intravenous contrast. Multiplanar CT image reconstructions and MIPs were  obtained to evaluate the vascular anatomy. Multidetector CT imaging of the abdomen and pelvis was performed using the standard protocol during bolus administration of intravenous contrast. CONTRAST:  22mL ISOVUE-370 IOPAMIDOL (ISOVUE-370) INJECTION 76% COMPARISON:  None. FINDINGS: CTA CHEST FINDINGS Cardiovascular: Normally opacified pulmonary arteries with no pulmonary arterial filling defects seen. Atheromatous calcifications, including the coronary arteries and aorta. Borderline enlarged heart. No pericardial effusion. Mediastinum/Nodes: Mildly prominent precarinal node with a short axis diameter of 10 mm on image number 40 series 4. Mildly enlarged right hilar nodes, the largest with a short axis diameter of 11 mm on image number 54 series 4. Enlarged left hilar nodes, the largest with a short axis diameter of 10 mm on image number 52 series 4. Endotracheal 2 tip 2.7 cm above the carina. Nasogastric tube extending into the stomach. Lungs/Pleura: Dense, patchy and confluent airspace opacity in both lower lobes. Diffusely prominent interstitial markings with septal edema. Small bilateral pleural effusions. Musculoskeletal: Thoracic and lower cervical spine degenerative changes. Review of the MIP images confirms the above findings. CT ABDOMEN and PELVIS FINDINGS Hepatobiliary: Multiple gallstones in the gallbladder measuring up to 2.0 cm in maximum diameter each. No gallbladder wall thickening or pericholecystic fluid. Normal appearing liver. Pancreas: Unremarkable. No pancreatic ductal dilatation or surrounding inflammatory changes. Spleen: Normal in size without focal abnormality. Adrenals/Urinary Tract: Small bilateral renal cysts. Normal appearing adrenal glands  and ureters. Foley catheter in the urinary bladder with associated air in the bladder. Stomach/Bowel: Single distal descending colon diverticulum. Nasogastric tube tip in the mid to proximal stomach. Unremarkable small bowel. No evidence of  appendicitis. Vascular/Lymphatic: Atheromatous arterial calcifications without aneurysm. No enlarged lymph nodes. Reproductive: Normal sized uterus with multiple small peripheral calcifications. No adnexal mass. Other: No abdominal wall hernia or abnormality. No abdominopelvic ascites. Musculoskeletal: Lumbar and lower thoracic spine degenerative changes. Review of the MIP images confirms the above findings. IMPRESSION: 1. No pulmonary emboli. 2. Dense, patchy and confluent airspace opacity in both lower lobes. This is most likely due to pneumonia. Patchy atelectasis is less likely. 3. Borderline cardiomegaly with changes of acute congestive heart failure. 4. Mild mediastinal and bilateral hilar adenopathy, most likely reactive. 5. Cholelithiasis without evidence cholecystitis. 6.  Calcific coronary artery and aortic atherosclerosis. Aortic Atherosclerosis (ICD10-I70.0). Electronically Signed   By: Claudie Revering M.D.   On: 08/05/2018 15:40   Dg Chest Port 1 View  Result Date: 08/05/2018 CLINICAL DATA:  Patient poor historian at this time. Unable to obtain hx at this time. Encounter for SOB. EXAM: PORTABLE CHEST - 1 VIEW COMPARISON:  02/16/2015 FINDINGS: Moderate interstitial edema or infiltrates and central pulmonary vascular congestion, new since previous. Heart size upper limits normal. Aortic Atherosclerosis (ICD10-170.0). No effusion.  No pneumothorax. Visualized bones unremarkable. IMPRESSION: 1. Moderate bilateral interstitial edema or infiltrates, new since previous. Electronically Signed   By: Lucrezia Europe M.D.   On: 08/05/2018 14:25     IMPRESSION AND PLAN:   77 year old female with past medical history of COPD with ongoing tobacco abuse, diabetes, hypertension, hyperlipidemia, GERD who presents to the hospital due to shortness of breath and noted to be in acute on chronic respiratory failure with hypoxia and hypercapnia and also sepsis.  1.  Acute on chronic respiratory failure with  hypoxia/hypercapnia-secondary to underlying COPD exacerbation and also multifocal pneumonia seen on CT chest. - Patient failed BiPAP, currently intubated and sedated. - Patient will be admitted to the ICU, continue vent support as per pulmonary/intensivist.  Treat the patient's COPD with steroids, scheduled duo nebs, Pulmicort nebs.  We will also treat underlying pneumonia with broad-spectrum IV antibiotics with vancomycin, Zosyn.  2.  Sepsis- secondary to multifocal pneumonia.  She meets criteria given her leukocytosis, elevated lactate and CT chest findings suggestive of multifocal pneumonia. - As mentioned we will treat with IV vancomycin, Zosyn.  Follow cultures. -Follow lactate  3.  COPD exacerbation-secondary to pneumonia. - Treat patient with IV steroids, scheduled duo nebs, Pulmicort nebs.  Continue antibiotics as mentioned above.  4.  Leukocytosis -secondary to the sepsis/pneumonia.  Follow with IV antibiotic therapy  5.  Elevated troponin-secondary to supply demand ischemia from hypoxemia. -We will cycle cardiac markers, get an echocardiogram.  6.  Diabetes type 2 without complication-we will place on sliding scale insulin, hold metformin, glipizide for now.  7.  GERD-continue Protonix.  8.  Hyperlipidemia-continue atorvastatin.  Patient will be admitted to the ICU, signed out the patient to the intensivist on-call.  All the records are reviewed and case discussed with ED provider. Management plans discussed with the patient, family and they are in agreement.  CODE STATUS: Full code  TOTAL Critical Care TIME TAKING CARE OF THIS PATIENT: 45 minutes.    [name] M.D on 08/05/2018 at 4:30 PM  Between 7am to 6pm - Pager - [phone]  After 6pm go to [email or web address] - password Child psychotherapist Hospitalists  Office  [phone]  CC: Primary care physician; System, Pcp Not In

## 2018-08-05 NOTE — Progress Notes (Signed)
Pharmacy Antibiotic Note  Brittney [name] is a 77 y.o. female admitted on 08/05/2018 with sepsis.  Pharmacy has been consulted for vancomycin and Zosyn dosing. She has a history of diabetes, hypertension, hyperlipidemia, GERD, ongoing tobacco abuse, COPD who presents to the hospital due to SOB and noted to be in sepsis secondary to pneumonia.  Patient is presently intubated and sedated. There is no recent history of vancomycin therapy to guide dosing.  Plan: Vancomycin 1250mg  IV every 18 hours starting 6 hours after 1000mg  initial dose in the ED.  K 0.049, T1/2 14h, Vd 59L, calculated concentrations at steady-state: 38.7/16.8 mcg/mL Goal trough 15-20 mcg/mL.  Zosyn 3.375g IV q8h (4 hour infusion).  Height: 5\' 5"  (165.1 cm) Weight: 185 lb (83.9 kg) IBW/kg (Calculated) : 57  Temp (24hrs), Avg:98.8 F (37.1 C), Min:98.8 F (37.1 C), Max:98.8 F (37.1 C)  Recent Labs  Lab 08/05/18 1355 08/05/18 1358  WBC 22.6*  --   CREATININE 1.16*  --   LATICACIDVEN  --  7.2*    Estimated Creatinine Clearance: 43.5 mL/min (A) (by C-G formula based on SCr of 1.16 mg/dL (H)).    Allergies  Allergen Reactions  . Accupril [Quinapril Hcl]     Antimicrobials this admission: Vancomycin 8/10 >>  Zosyn 8/10 >>   Microbiology results: 8/10 BCx: 8/10 UCx:  8/11 MRSA PCR:   Thank you for allowing pharmacy to be a part of this patient's care.  Dallie Piles, PharmD 08/05/2018 2:32 PM

## 2018-08-05 NOTE — ED Triage Notes (Addendum)
Pt presents today via [facility] from home difficulty breathing started this morning. Pt is diaphoretic. Pt is lethargic on arrival. pt LOC has declined int he last 30 mins EDP at bedside.  Family found pt is Resp distress and called 911.

## 2018-08-05 NOTE — ED Notes (Signed)
Pt is tolerating intubation at this time. Pt has family at bedside. VSS core temp is 95.8 warm blankets were placed. Admitting MD at beside.

## 2018-08-05 NOTE — ED Notes (Signed)
Pt still restless at this time. Prop drip titrated per order. Suction was preformed RN will monitor.

## 2018-08-05 NOTE — Progress Notes (Signed)
CODE SEPSIS - PHARMACY COMMUNICATION  **Broad Spectrum Antibiotics should be administered within 1 hour of Sepsis diagnosis**  Time Code Sepsis Called/Page Received: 1357  Antibiotics Ordered: vancomycin and Zosyn  Time of 1st antibiotic administration: 1430  Additional action taken by pharmacy: none required  If necessary, Name of Provider/Nurse Contacted: N/A    Dallie Piles ,PharmD Clinical Pharmacist  08/05/2018  2:37 PM

## 2018-08-05 NOTE — ED Notes (Signed)
Pt family came to nurses desk while this RN given report stating pt was gagging on tube. RN assessed and explained to family that sometimes involuntary movement was normal with sedation.  RN awaiting RT for transport to ICU bed 17

## 2018-08-05 NOTE — ED Notes (Signed)
Pt is intubated at this time. 2 RT EDP 2 This RN and Airline pilot at bedside.   150mg  ketamine given at 1412 75 mg of Rocuronium given 1412  Intubation confirmed by color change, pt has a 7.5 tube 21 at the lip.   Verbal order for foley temp and OG.   Pt Vs are as follows 129 Hr  99 Intubation 205/106 (128)

## 2018-08-05 NOTE — ED Provider Notes (Signed)
[facility] Emergency Department Provider Note  ____________________________________________  Time seen: Approximately 3:32 PM  I have reviewed the triage vital signs and the nursing notes.   HISTORY  Chief Complaint Shortness of Breath  Level 5 Caveat: Portions of the History and Physical including HPI and review of systems are unable to be completely obtained due to altered mental status acute critical illness   HPI Brittney DROEGE is a 77 y.o. female brought to the ED via EMS due to shortness of breath that started this morning.  EMS report that during their transport the patient rapidly deteriorated, having hypoxia to about 70%.  They gave 2 duo nebs and started nonrebreather O2 with improvement to 90%.  They note that mental status has declined and patient is somnolent where she was alert prior to leaving home.  Patient unable to provide any history.      Past Medical History:  Diagnosis Date  . Diabetes mellitus without complication ([place])   . GERD (gastroesophageal reflux disease)   . Hyperlipidemia   . Hypertension   . Vitamin D deficiency      There are no active problems to display for this patient.    Past Surgical History:  Procedure Laterality Date  . COLONOSCOPY       Prior to Admission medications   Medication Sig Start Date End Date Taking? Authorizing Provider  amLODipine (NORVASC) 10 MG tablet Take 10 mg by mouth daily.   Yes [provider]  aspirin 81 MG tablet Take 81 mg by mouth daily.   Yes [provider]  atorvastatin (LIPITOR) 40 MG tablet Take 1 tablet by mouth daily. 07/04/18  Yes [provider]  cetirizine (ZYRTEC) 10 MG tablet Take 10 mg by mouth daily.    Yes [provider]  glipiZIDE (GLUCOTROL XL) 10 MG 24 hr tablet Take 10 mg by mouth 2 (two) times daily.   Yes [provider]  Liraglutide (VICTOZA) 18 MG/3ML SOPN Inject 1.8 mg into the skin every morning.   Yes  [provider]  losartan (COZAAR) 100 MG tablet Take 100 mg by mouth daily.   Yes [provider]  metFORMIN (GLUCOPHAGE) 1000 MG tablet Take 1,000 mg by mouth 2 (two) times daily with a meal.   Yes [provider]  omeprazole (PRILOSEC) 20 MG capsule Take 20 mg by mouth daily.   Yes [provider]  ranitidine (ZANTAC) 150 MG tablet Take 1 tablet by mouth 2 (two) times daily. 05/06/18  Yes [provider]  sitaGLIPtin (JANUVIA) 100 MG tablet Take 100 mg by mouth daily.   Yes [provider]  albuterol (PROVENTIL HFA;VENTOLIN HFA) 108 (90 BASE) MCG/ACT inhaler Inhale 2 puffs into the lungs every 6 (six) hours as needed for wheezing or shortness of breath.     [provider]     Allergies Accupril [quinapril hcl]   No family history on file.  Social History Social History   Tobacco Use  . Smoking status: Current Every Day Smoker    Packs/day: 1.00    Types: Cigarettes  . Smokeless tobacco: Never Used  Substance Use Topics  . Alcohol use: No  . Drug use: No    Review of Systems  Level 5 Caveat: Portions of the History and Physical including HPI and review of systems are unable to be completely obtained due to altered mental status due to critical illness     ____________________________________________   PHYSICAL EXAM:  VITAL SIGNS:  ED Triage Vitals  Enc Vitals Group     BP 08/05/18 1359 (!) 204/123     Pulse Rate 08/05/18 1359 (!) 136     Resp 08/05/18 1400 (!) 39     Temp 08/05/18 1359 98.8 F (37.1 C)     Temp Source 08/05/18 1359 Axillary     SpO2 08/05/18 1355 94 %     Weight 08/05/18 1358 185 lb (83.9 kg)     Height 08/05/18 1358 5\' 5"  (1.651 m)     Head Circumference --      Peak Flow --      Pain Score --      Pain Loc --      Pain Edu? --      Excl. in [place]? --     Vital signs reviewed, nursing assessments reviewed.   Constitutional: Somnolent, ill-appearing Eyes:   Conjunctivae are  normal. EOMI. PERRL. ENT      Head:   Normocephalic and atraumatic.      Nose:   No congestion/rhinnorhea.       Mouth/Throat:   Dry mucous membranes, no pharyngeal erythema. No peritonsillar mass.       Neck:   No meningismus. Full ROM. Hematological/Lymphatic/Immunilogical:   No cervical lymphadenopathy. Cardiovascular:   Tachycardia heart rate 130. Symmetric bilateral radial and DP pulses.  No murmurs. Cap refill less than 2 seconds. Respiratory:   Tachypnea, respiratory rate approximately 40.  Accessory muscle use and retractions.  Diminished breath sounds at bilateral bases.  Diffuse slight expiratory wheezing. Gastrointestinal:   Soft diffuse right-sided tenderness. Non distended.No rebound, rigidity, or guarding. Genitourinary:   Normal Musculoskeletal:   Normal range of motion in all extremities. No joint effusions.  No lower extremity tenderness.  No edema. Neurologic:   Somnolent GCS = E3V1M5 = 9 Motor grossly intact.  Skin:    Skin is warm, dry and intact. No rash noted.  No petechiae, purpura, or bullae.  ____________________________________________    LABS (pertinent positives/negatives) (all labs ordered are listed, but only abnormal results are displayed) Labs Reviewed  LACTIC ACID, PLASMA - Abnormal; Notable for the following components:      Result Value   Lactic Acid, Venous 7.2 (*)    All other components within normal limits  COMPREHENSIVE METABOLIC PANEL - Abnormal; Notable for the following components:   CO2 19 (*)    Glucose, Bld 405 (*)    Creatinine, Ser 1.16 (*)    AST 43 (*)    GFR calc non Af Amer 44 (*)    GFR calc Af Amer 51 (*)    All other components within normal limits  TROPONIN I - Abnormal; Notable for the following components:   Troponin I 0.04 (*)    All other components within normal limits  CBC WITH DIFFERENTIAL/PLATELET - Abnormal; Notable for the following components:   WBC 22.6 (*)    RDW 15.3 (*)    Platelets 461 (*)    Neutro Abs  9.0 (*)    Lymphs Abs 11.3 (*)    Monocytes Absolute 1.4 (*)    Basophils Absolute 0.2 (*)    All other components within normal limits  URINALYSIS, COMPLETE (UACMP) WITH MICROSCOPIC - Abnormal; Notable for the following components:   Color, Urine YELLOW (*)    APPearance HAZY (*)    Glucose, UA 50 (*)    Protein, ur >=300 (*)    Bacteria, UA FEW (*)    All other components within normal limits  BLOOD GAS, ARTERIAL - Abnormal; Notable for the following components:   pH, Arterial 6.98 (*)    pCO2 arterial 68 (*)    Bicarbonate 16.0 (*)    Acid-base deficit 16.6 (*)    All other components within normal limits  CULTURE, BLOOD (ROUTINE X 2)  CULTURE, BLOOD (ROUTINE X 2)  URINE CULTURE  LIPASE, BLOOD  PROCALCITONIN  APTT  PROTIME-INR  LACTIC ACID, PLASMA  BLOOD GAS, ARTERIAL  INFLUENZA PANEL BY PCR (TYPE A & B)   ____________________________________________   EKG    ____________________________________________    RADIOLOGY  Dg Chest Port 1 View  Result Date: 08/05/2018 CLINICAL DATA:  Patient poor historian at this time. Unable to obtain hx at this time. Encounter for SOB. EXAM: PORTABLE CHEST - 1 VIEW COMPARISON:  02/16/2015 FINDINGS: Moderate interstitial edema or infiltrates and central pulmonary vascular congestion, new since previous. Heart size upper limits normal. Aortic Atherosclerosis (ICD10-170.0). No effusion.  No pneumothorax. Visualized bones unremarkable. IMPRESSION: 1. Moderate bilateral interstitial edema or infiltrates, new since previous. Electronically Signed   By: Lucrezia Europe M.D.   On: 08/05/2018 14:25    ____________________________________________   PROCEDURES .Critical Care Performed by: Carrie Mew, MD Authorized by: Carrie Mew, MD   Critical care provider statement:    Critical care time (minutes):  35   Critical care time was exclusive of:  Separately billable procedures and treating other patients   Critical care was  necessary to treat or prevent imminent or life-threatening deterioration of the following conditions:  Shock, sepsis and respiratory failure   Critical care was time spent personally by me on the following activities:  Development of treatment plan with patient or surrogate, discussions with consultants, evaluation of patient's response to treatment, examination of patient, obtaining history from patient or surrogate, ordering and performing treatments and interventions, ordering and review of laboratory studies, ordering and review of radiographic studies, pulse oximetry, re-evaluation of patient's condition and review of old charts Comments:         Procedure Name: Intubation Date/Time: 08/05/2018 3:33 PM Performed by: Carrie Mew, MD Pre-anesthesia Checklist: Patient identified, Patient being monitored, Emergency Drugs available, Timeout performed and Suction available Oxygen Delivery Method: Non-rebreather mask Preoxygenation: Pre-oxygenation with 100% oxygen Induction Type: Rapid sequence Ventilation: Mask ventilation without difficulty Laryngoscope Size: Glidescope and 3 Grade View: Grade I Tube type: Subglottic suction tube Tube size: 7.5 mm Number of attempts: 1 Placement Confirmation: ETT inserted through vocal cords under direct vision,  CO2 detector and Breath sounds checked- equal and bilateral Tube secured with: ETT holder       ____________________________________________  DIFFERENTIAL DIAGNOSIS   Pulmonary embolism, sepsis, pneumonia, cholecystitis, appendicitis, pulmonary edema  CLINICAL IMPRESSION / ASSESSMENT AND PLAN / ED COURSE  Pertinent labs & imaging results that were available during my care of the patient were reviewed by me and considered in my medical decision making (see chart for details).      Clinical Course as of Aug 06 1539  Sat Aug 05, 2018  1356 Bipap on arrival due to resp distress. Albuterol. Possible sepsis vs pulm edema vs PE.  Hypertensive. So2 100% on bipap. Will f/u cxr and abg and reassess resp. Function.   [PS]  8242 C/w acidosis and likely sepsis. Continue IVF, abx.CT chest, abd, pelvis ordered due to critical illness without clear source of infection.   Lactic Acid, Venous(!!): 7.2 [PS]    Clinical Course User Index [PS] Carrie Mew, MD     ----------------------------------------- 3:33 PM  on 08/05/2018 -----------------------------------------  As blood gas result was resulting with pH of 6.9 and PCO2 of 60, indicative of likely metabolic acidosis and not a primary respiratory failure causing the acidosis and increased work of breathing, patient was intubated due to respiratory failure and expected clinical course of deterioration.  Empiric antibiotics with IV Zosyn and vancomycin.  Large volume fluid resuscitation with normal saline, 30 mill liters per kilogram.  Initial lactate was 7.  Will repeat lactate.  Radiology review of chest x-ray suggests bilateral infiltrates.  CT scan demonstrates bilateral pneumonia as the source of infection.  Will admit to ICU.  Family updated.  ____________________________________________   FINAL CLINICAL IMPRESSION(S) / ED DIAGNOSES    Final diagnoses:  Pneumonia of both lower lobes due to infectious organism [facility])  Acute respiratory failure with hypoxia ([place])  Septic shock [facility])     ED Discharge Orders    None      Portions of this note were generated with dragon dictation software. Dictation errors may occur despite best attempts at proofreading.    Carrie Mew, MD 08/05/18 1540

## 2018-08-05 NOTE — Progress Notes (Signed)
Patient on vent. Per intubation et tube placement noted to be at 21cm at lip. Now noted to be at 24 cm at lip. Per results review, no indication of post intubation xray for verification of tube placement was done in ed. RN to notify NP. ET tubed pulled back to original documentation of 21 cm per initial documentation. Patient tolerated well.

## 2018-08-05 NOTE — Consult Note (Signed)
Name: Brittney [name] MRN: [id number] DOB: 1941-02-03     CONSULTATION DATE: 08/05/2018  Subjective: 77 years old lady with a history of diabetes mellitus, dyslipidemia, hypertension, GERD, chronic tobacco abuse, COPD.  Patient presented to the ED with respiratory distress, she was found to have multifocal pneumonia, sepsis with leukocytosis and elevated lactic acid.  Initially she was tried on a BiPAP however respiratory status continued to decline and she had to be intubated by emergency room provider. Patient arrived to ICU intubated sedated with propofol in no distress. All history was obtained from the admitting physician and EMR.  PAST MEDICAL HISTORY :   has a past medical history of Diabetes mellitus without complication ([place]), GERD (gastroesophageal reflux disease), Hyperlipidemia, Hypertension, and Vitamin D deficiency.  has a past surgical history that includes Colonoscopy. Prior to Admission medications   Medication Sig Start Date End Date Taking? Authorizing Provider  amLODipine (NORVASC) 10 MG tablet Take 10 mg by mouth daily.   Yes [provider]  aspirin 81 MG tablet Take 81 mg by mouth daily.   Yes [provider]  atorvastatin (LIPITOR) 40 MG tablet Take 1 tablet by mouth daily. 07/04/18  Yes [provider]  cetirizine (ZYRTEC) 10 MG tablet Take 10 mg by mouth daily.    Yes [provider]  glipiZIDE (GLUCOTROL XL) 10 MG 24 hr tablet Take 10 mg by mouth 2 (two) times daily.   Yes [provider]  Liraglutide (VICTOZA) 18 MG/3ML SOPN Inject 1.8 mg into the skin every morning.   Yes [provider]  losartan (COZAAR) 100 MG tablet Take 100 mg by mouth daily.   Yes [provider]  metFORMIN (GLUCOPHAGE) 1000 MG tablet Take 1,000 mg by mouth 2 (two) times daily with a meal.   Yes [provider]  omeprazole (PRILOSEC) 20 MG capsule Take 20 mg by mouth daily.   Yes [provider]  ranitidine  (ZANTAC) 150 MG tablet Take 1 tablet by mouth 2 (two) times daily. 05/06/18  Yes [provider]  sitaGLIPtin (JANUVIA) 100 MG tablet Take 100 mg by mouth daily.   Yes [provider]  albuterol (PROVENTIL HFA;VENTOLIN HFA) 108 (90 BASE) MCG/ACT inhaler Inhale 2 puffs into the lungs every 6 (six) hours as needed for wheezing or shortness of breath.     [provider]   Allergies  Allergen Reactions  . Accupril [Quinapril Hcl]     FAMILY HISTORY:  family history is not on file. SOCIAL HISTORY:  reports that she has been smoking cigarettes. She has a 40.00 pack-year smoking history. She has never used smokeless tobacco. She reports that she does not drink alcohol or use drugs.  REVIEW OF SYSTEMS:   Unable to obtain due to critical illness   VITAL SIGNS: Temp:  [95.8 F (35.4 C)-98.8 F (37.1 C)] 96.8 F (36 C) (08/10 1802) Pulse Rate:  [108-139] 115 (08/10 1700) Resp:  [18-39] 19 (08/10 1802) BP: (95-204)/(59-153) 95/77 (08/10 1802) SpO2:  [94 %-100 %] 100 % (08/10 1802) FiO2 (%):  [60 %] 60 % (08/10 1505) Weight:  [83 kg-83.9 kg] 83 kg (08/10 1802)  Physical Examination:  Sedated with propofol to RASS of -3 On vent, no distress, bilateral equal air entry and bibasilar medium crackles right more than left S1 & S2 are audible with no murmur Benign abdominal exam with normal peristalsis Within normal extremities and no peripheral edema   ASSESSMENT / PLAN:  Acute respiratory failure with hypoxemia  and hypercapnia. Intubated on 08/05/2018. -Monitor ABG, optimize ventilator settings and continuous vent support   Acute exacerbation of COPD -Optimize bronchodilators + inhaled steroids + tapering systemic steroids + empiric antibiotics  Pneumonia.  CT chest ruled out PE, bibasilar infiltrate, mild mediastinal and bilateral hilar adenopathy more likely reactive with mild pulmonary congestion. -Empiric vancomycin + Zosyn + Zithromax. -Monitor CXR + CBC  + FiO2 and cultures  Sepsis with leukocytosis and lactic acidemia -Empiric Zosyn and vancomycin.  Monitor procalcitonin, lactic acid and cultures  Diabetes mellitus -Glycemic control -Monitor hemoglobin A1c  Dyslipidemia -Statin  GERD -PPI  Reactive thrombocytosis -Monitor platelets  Cholelithiasis with no signs of acute cholecystitis  Borderline cardiomegaly -Follow with echocardiogram   Full code   DVT & GI prophylaxis.  Continue with supportive care   Critical care time 45 minutes

## 2018-08-05 NOTE — ED Notes (Signed)
Pt family placed in the family waiting room.

## 2018-08-05 NOTE — ED Notes (Signed)
.  Labs sent at this time. RN will monitor for results.   

## 2018-08-05 NOTE — ED Notes (Signed)
Cultures x2 sent and obtained via [place] device.

## 2018-08-05 NOTE — ED Notes (Signed)
Pt BP became soft RN decreased prop drip at this time. RN will monitor.

## 2018-08-05 NOTE — ED Notes (Signed)
Pt "gagging" on tube and moving head around. Rn titrated prop up to 20mcg/kg/min. RN will  Monitor.

## 2018-08-05 NOTE — ED Notes (Signed)
Per verbal order from [place] orders are on hold as Prop is sustaining sedation. Pt BP is stable at this time. PT transported to CT at this time.

## 2018-08-06 ENCOUNTER — Inpatient Hospital Stay: Payer: Medicare HMO

## 2018-08-06 ENCOUNTER — Encounter: Payer: Self-pay | Admitting: *Deleted

## 2018-08-06 ENCOUNTER — Inpatient Hospital Stay
Admit: 2018-08-06 | Discharge: 2018-08-06 | Disposition: A | Discharge status: Home / Self Care | Payer: Medicare HMO | Attending: Specialist | Admitting: Specialist

## 2018-08-06 LAB — GLUCOSE, CAPILLARY
Glucose-Capillary: 337 mg/dL — ABNORMAL HIGH (ref 70–99)
Glucose-Capillary: 325 mg/dL — ABNORMAL HIGH (ref 70–99)
Glucose-Capillary: 331 mg/dL — ABNORMAL HIGH (ref 70–99)
Glucose-Capillary: 287 mg/dL — ABNORMAL HIGH (ref 70–99)
Glucose-Capillary: 262 mg/dL — ABNORMAL HIGH (ref 70–99)
Glucose-Capillary: 257 mg/dL — ABNORMAL HIGH (ref 70–99)

## 2018-08-06 LAB — BASIC METABOLIC PANEL
Anion gap: 10 (ref 5–15)
Anion gap: 10 (ref 5–15)
Anion gap: 8 (ref 5–15)
BUN: 17 mg/dL (ref 8–23)
BUN: 19 mg/dL (ref 8–23)
BUN: 24 mg/dL — ABNORMAL HIGH (ref 8–23)
CO2: 20 mmol/L — ABNORMAL LOW (ref 22–32)
CO2: 21 mmol/L — ABNORMAL LOW (ref 22–32)
CO2: 20 mmol/L — ABNORMAL LOW (ref 22–32)
Calcium: 8 mg/dL — ABNORMAL LOW (ref 8.9–10.3)
Calcium: 7.9 mg/dL — ABNORMAL LOW (ref 8.9–10.3)
Calcium: 7.9 mg/dL — ABNORMAL LOW (ref 8.9–10.3)
Chloride: 107 mmol/L (ref 98–111)
Chloride: 107 mmol/L (ref 98–111)
Chloride: 109 mmol/L (ref 98–111)
Creatinine, Ser: 1.48 mg/dL — ABNORMAL HIGH (ref 0.44–1.00)
Creatinine, Ser: 1.64 mg/dL — ABNORMAL HIGH (ref 0.44–1.00)
Creatinine, Ser: 1.55 mg/dL — ABNORMAL HIGH (ref 0.44–1.00)
GFR calc Af Amer: 38 mL/min — ABNORMAL LOW (ref >60)
GFR calc Af Amer: 34 mL/min — ABNORMAL LOW (ref >60)
GFR calc Af Amer: 36 mL/min — ABNORMAL LOW (ref >60)
GFR calc non Af Amer: 33 mL/min — ABNORMAL LOW (ref >60)
GFR calc non Af Amer: 29 mL/min — ABNORMAL LOW (ref >60)
GFR calc non Af Amer: 31 mL/min — ABNORMAL LOW (ref >60)
Glucose, Bld: 264 mg/dL — ABNORMAL HIGH (ref 70–99)
Glucose, Bld: 274 mg/dL — ABNORMAL HIGH (ref 70–99)
Glucose, Bld: 350 mg/dL — ABNORMAL HIGH (ref 70–99)
Potassium: 5.4 mmol/L — ABNORMAL HIGH (ref 3.5–5.1)
Potassium: 5.4 mmol/L — ABNORMAL HIGH (ref 3.5–5.1)
Potassium: 4.5 mmol/L (ref 3.5–5.1)
Sodium: 137 mmol/L (ref 135–145)
Sodium: 138 mmol/L (ref 135–145)
Sodium: 137 mmol/L (ref 135–145)

## 2018-08-06 LAB — HEMOGLOBIN AND HEMATOCRIT, BLOOD
HCT: 35.5 % (ref 35.0–47.0)
HCT: 33.4 % — ABNORMAL LOW (ref 35.0–47.0)
HCT: 35.1 % (ref 35.0–47.0)
Hemoglobin: 11.9 g/dL — ABNORMAL LOW (ref 12.0–16.0)
Hemoglobin: 11.7 g/dL — ABNORMAL LOW (ref 12.0–16.0)
Hemoglobin: 11.8 g/dL — ABNORMAL LOW (ref 12.0–16.0)

## 2018-08-06 LAB — TYPE AND SCREEN
ABO/RH(D): B POS
Antibody Screen: NEGATIVE

## 2018-08-06 LAB — CBC
HCT: 35.7 % (ref 35.0–47.0)
Hemoglobin: 12.4 g/dL (ref 12.0–16.0)
MCH: 28.6 pg (ref 26.0–34.0)
MCHC: 34.8 g/dL (ref 32.0–36.0)
MCV: 82.1 fL (ref 80.0–100.0)
Platelets: 389 10*3/uL (ref 150–440)
RBC: 4.35 MIL/uL (ref 3.80–5.20)
RDW: 15.2 % — ABNORMAL HIGH (ref 11.5–14.5)
WBC: 14.7 10*3/uL — ABNORMAL HIGH (ref 3.6–11.0)

## 2018-08-06 LAB — MAGNESIUM
Magnesium: 2 mg/dL (ref 1.7–2.4)
Magnesium: 2.2 mg/dL (ref 1.7–2.4)

## 2018-08-06 LAB — PHOSPHORUS
Phosphorus: 5 mg/dL — ABNORMAL HIGH (ref 2.5–4.6)
Phosphorus: 4.8 mg/dL — ABNORMAL HIGH (ref 2.5–4.6)

## 2018-08-06 LAB — BLOOD GAS, ARTERIAL
Acid-base deficit: 8.1 mmol/L — ABNORMAL HIGH (ref 0.0–2.0)
Bicarbonate: 17.8 mmol/L — ABNORMAL LOW (ref 20.0–28.0)
FIO2: 0.4
MECHVT: 450 mL
Mechanical Rate: 20
O2 Saturation: 99.4 %
PEEP: 5 cmH2O
Patient temperature: 37
pCO2 arterial: 37 mmHg (ref 32.0–48.0)
pH, Arterial: 7.29 — ABNORMAL LOW (ref 7.350–7.450)
pO2, Arterial: 174 mmHg — ABNORMAL HIGH (ref 83.0–108.0)

## 2018-08-06 LAB — TROPONIN I: Troponin I: 0.88 ng/mL (ref <0.03)

## 2018-08-06 LAB — STREP PNEUMONIAE URINARY ANTIGEN: Strep Pneumo Urinary Antigen: NEGATIVE

## 2018-08-06 MED ORDER — SODIUM CHLORIDE 0.9 % IV SOLN
INTRAVENOUS | Status: DC
  Administered 2018-08-06 – 2018-08-07 (×3): via INTRAVENOUS

## 2018-08-06 MED ORDER — INSULIN GLARGINE 100 UNIT/ML ~~LOC~~ SOLN
4.0000 [IU] | Freq: Every day | SUBCUTANEOUS | Status: DC
  Administered 2018-08-06: 4 [IU] via SUBCUTANEOUS
  Filled 2018-08-06 (×2): qty 0.04

## 2018-08-06 MED ORDER — SODIUM CHLORIDE 0.9 % IV SOLN
INTRAVENOUS | Status: AC
  Administered 2018-08-06: 12:00:00 via INTRAVENOUS

## 2018-08-06 MED ORDER — METHYLPREDNISOLONE SODIUM SUCC 40 MG IJ SOLR
40.0000 mg | Freq: Two times a day (BID) | INTRAMUSCULAR | Status: DC
  Administered 2018-08-06 – 2018-08-07 (×2): 40 mg via INTRAVENOUS
  Filled 2018-08-06 (×2): qty 1

## 2018-08-06 MED ORDER — SODIUM CHLORIDE 0.9 % IV SOLN
INTRAVENOUS | Status: DC
  Administered 2018-08-06: 2.9 [IU]/h via INTRAVENOUS
  Filled 2018-08-06 (×2): qty 1

## 2018-08-06 MED ORDER — ASPIRIN 81 MG PO CHEW
81.0000 mg | CHEWABLE_TABLET | Freq: Every day | ORAL | Status: DC
  Filled 2018-08-06: qty 1

## 2018-08-06 MED ORDER — PANTOPRAZOLE SODIUM 40 MG PO PACK
40.0000 mg | PACK | Freq: Every day | ORAL | Status: DC
  Filled 2018-08-06: qty 20

## 2018-08-06 MED ORDER — VITAL HIGH PROTEIN PO LIQD: 1000.0000 mL | ORAL | Status: DC

## 2018-08-06 MED ORDER — DEXMEDETOMIDINE HCL IN NACL 400 MCG/100ML IV SOLN
0.4000 ug/kg/h | INTRAVENOUS | Status: DC
Start: 2018-08-06 — End: 2018-08-07
  Administered 2018-08-06: 0.4 ug/kg/h via INTRAVENOUS
  Administered 2018-08-06 – 2018-08-07 (×2): 0.6 ug/kg/h via INTRAVENOUS
  Filled 2018-08-06 (×3): qty 100

## 2018-08-06 MED ORDER — PRO-STAT SUGAR FREE PO LIQD: 30.0000 mL | Freq: Three times a day (TID) | ORAL | Status: DC

## 2018-08-06 MED ORDER — INSULIN ASPART 100 UNIT/ML ~~LOC~~ SOLN
0.0000 [IU] | Freq: Four times a day (QID) | SUBCUTANEOUS | Status: DC
  Administered 2018-08-06 (×2): 7 [IU] via SUBCUTANEOUS
  Filled 2018-08-06 (×2): qty 1

## 2018-08-06 MED ORDER — SODIUM CHLORIDE 0.9% IV SOLUTION: Freq: Once | INTRAVENOUS | Status: DC

## 2018-08-06 MED ORDER — PANTOPRAZOLE SODIUM 40 MG IV SOLR: 40.0000 mg | Freq: Two times a day (BID) | INTRAVENOUS | Status: DC

## 2018-08-06 NOTE — Progress Notes (Signed)
Pharmacy Antibiotic Note  Brittney [name] is a 77 y.o. female admitted on 08/05/2018 with sepsis.  Pharmacy has been consulted for vancomycin and Zosyn dosing. She has a history of diabetes, hypertension, hyperlipidemia, GERD, ongoing tobacco abuse, COPD who presents to the hospital due to SOB and noted to be in sepsis secondary to pneumonia.  Patient is presently intubated and sedated. There is no recent history of vancomycin therapy to guide dosing.  Plan: D/c vancomycin, MRSA PCR is negative. Discussed with ICU MD.   Continue Zosyn 3.375g IV q8h (4 hour infusion).  Height: 5\' 7"  (170.2 cm) Weight: 182 lb 15.7 oz (83 kg) IBW/kg (Calculated) : 61.6  Temp (24hrs), Avg:97.7 F (36.5 C), Min:95.8 F (35.4 C), Max:98.8 F (37.1 C)  Recent Labs  Lab 08/05/18 1355 08/05/18 1358 08/05/18 1922 08/06/18 0058 08/06/18 0556  WBC 22.6*  --   --  14.7*  --   CREATININE 1.16*  --   --  1.48* 1.64*  LATICACIDVEN  --  7.2* 3.4*  --   --     Estimated Creatinine Clearance: 31.8 mL/min (A) (by C-G formula based on SCr of 1.64 mg/dL (H)).    Allergies  Allergen Reactions  . Accupril [Quinapril Hcl]     Antimicrobials this admission: Vancomycin 8/10 >> 8/11 Zosyn 8/10 >>   Microbiology results: 8/10 BCx: NGTD 8/10 UCx: sent 8/10 MRSA PCR: neg  Thank you for allowing pharmacy to be a part of this patient's care.  Rocky Morel, PharmD 08/06/2018 10:44 AM

## 2018-08-06 NOTE — Progress Notes (Signed)
RN called and spoke with Dr. Soyla Murphy and made MD aware that patient has what looks like dark red coming from OG tube.  MD came and assessed and gave order to hold off on feeding but that meds could be given down tube.

## 2018-08-06 NOTE — Progress Notes (Signed)
[place] [facility] [place] NAME: Brittney [name]    MR#:  [id number]  DATE OF BIRTH:  [birth year]  SUBJECTIVE:  CHIEF COMPLAINT:   Chief Complaint  Patient presents with  . Shortness of Breath  Patient seen and evaluated today On ventilator Dark residual coming from OGT Unable to give any history  REVIEW OF SYSTEMS:    ROS  Could not be obtained as patient is on ventilator and critically ill  DRUG ALLERGIES:   Allergies  Allergen Reactions  . Accupril [Quinapril Hcl]     VITALS:  Blood pressure (!) 117/57, pulse 84, temperature 98.2 F (36.8 C), resp. rate (!) 21, height 5\' 7"  (1.702 m), weight 83 kg, SpO2 99 %.  PHYSICAL EXAMINATION:   Physical Exam  GENERAL:  77 y.o.-year-old patient lying in the bed on ventilator Tidal volume 450  rate 20 PEEP 5 FiO2 30% EYES: Pupils equal, round, reactive to light and accommodation. No scleral icterus. Extraocular muscles intact.  HEENT: Head atraumatic, normocephalic. Oropharynx ET tube noted NECK:  Supple, no jugular venous distention. No thyroid enlargement, no tenderness.  LUNGS: Decreased breath sounds bilaterally, bibasilar rales heard. No use of accessory muscles of respiration.  CARDIOVASCULAR: S1, S2 normal. No murmurs, rubs, or gallops.  ABDOMEN: Soft, nontender, nondistended. Bowel sounds present. No organomegaly or mass.  EXTREMITIES: No cyanosis, clubbing or edema b/l.    NEUROLOGIC: On Ventilator and sedated PSYCHIATRIC: Could not be assessed SKIN: No obvious rash, lesion, or ulcer.   LABORATORY PANEL:   CBC Recent Labs  Lab 08/06/18 0058  WBC 14.7*  HGB 12.4  HCT 35.7  PLT 389   ------------------------------------------------------------------------------------------------------------------ Chemistries  Recent Labs  Lab 08/05/18 1355  08/06/18 0556  NA 137   < > 138  K 3.7   < > 5.4*  CL 104   < > 107  CO2 19*   < > 21*  GLUCOSE 405*   < > 274*  BUN 14    < > 19  CREATININE 1.16*   < > 1.64*  CALCIUM 9.0   < > 7.9*  MG  --   --  2.0  AST 43*  --   --   ALT 22  --   --   ALKPHOS 64  --   --   BILITOT 0.7  --   --    < > = values in this interval not displayed.   ------------------------------------------------------------------------------------------------------------------  Cardiac Enzymes Recent Labs  Lab 08/06/18 0058  TROPONINI 0.88*   ------------------------------------------------------------------------------------------------------------------  RADIOLOGY:  Dg Abd 1 View  Result Date: 08/05/2018 CLINICAL DATA:  Evaluate NG tube EXAM: ABDOMEN - 1 VIEW COMPARISON:  August 05, 2018 FINDINGS: The distal tip of the NG tube is in the gastric fundus. The side port is near the GE junction. IMPRESSION: The side port of the NG tube is near the GE junction. Consider advancement. Electronically Signed   By: Dorise Bullion III M.D   On: 08/05/2018 20:41   Ct Angio Chest Pe W And/or Wo Contrast  Result Date: 08/05/2018 CLINICAL DATA:  Respiratory distress, diaphoresis and lethargy. Intubated. Acute, generalized abdominal pain. EXAM: CT ANGIOGRAPHY CHEST CT ABDOMEN AND PELVIS WITH CONTRAST TECHNIQUE: Multidetector CT imaging of the chest was performed using the standard protocol during bolus administration of intravenous contrast. Multiplanar CT image reconstructions and MIPs were obtained to evaluate the vascular anatomy. Multidetector CT imaging of the abdomen and pelvis was performed using the standard protocol during  bolus administration of intravenous contrast. CONTRAST:  25mL ISOVUE-370 IOPAMIDOL (ISOVUE-370) INJECTION 76% COMPARISON:  None. FINDINGS: CTA CHEST FINDINGS Cardiovascular: Normally opacified pulmonary arteries with no pulmonary arterial filling defects seen. Atheromatous calcifications, including the coronary arteries and aorta. Borderline enlarged heart. No pericardial effusion. Mediastinum/Nodes: Mildly prominent precarinal  node with a short axis diameter of 10 mm on image number 40 series 4. Mildly enlarged right hilar nodes, the largest with a short axis diameter of 11 mm on image number 54 series 4. Enlarged left hilar nodes, the largest with a short axis diameter of 10 mm on image number 52 series 4. Endotracheal 2 tip 2.7 cm above the carina. Nasogastric tube extending into the stomach. Lungs/Pleura: Dense, patchy and confluent airspace opacity in both lower lobes. Diffusely prominent interstitial markings with septal edema. Small bilateral pleural effusions. Musculoskeletal: Thoracic and lower cervical spine degenerative changes. Review of the MIP images confirms the above findings. CT ABDOMEN and PELVIS FINDINGS Hepatobiliary: Multiple gallstones in the gallbladder measuring up to 2.0 cm in maximum diameter each. No gallbladder wall thickening or pericholecystic fluid. Normal appearing liver. Pancreas: Unremarkable. No pancreatic ductal dilatation or surrounding inflammatory changes. Spleen: Normal in size without focal abnormality. Adrenals/Urinary Tract: Small bilateral renal cysts. Normal appearing adrenal glands and ureters. Foley catheter in the urinary bladder with associated air in the bladder. Stomach/Bowel: Single distal descending colon diverticulum. Nasogastric tube tip in the mid to proximal stomach. Unremarkable small bowel. No evidence of appendicitis. Vascular/Lymphatic: Atheromatous arterial calcifications without aneurysm. No enlarged lymph nodes. Reproductive: Normal sized uterus with multiple small peripheral calcifications. No adnexal mass. Other: No abdominal wall hernia or abnormality. No abdominopelvic ascites. Musculoskeletal: Lumbar and lower thoracic spine degenerative changes. Review of the MIP images confirms the above findings. IMPRESSION: 1. No pulmonary emboli. 2. Dense, patchy and confluent airspace opacity in both lower lobes. This is most likely due to pneumonia. Patchy atelectasis is less  likely. 3. Borderline cardiomegaly with changes of acute congestive heart failure. 4. Mild mediastinal and bilateral hilar adenopathy, most likely reactive. 5. Cholelithiasis without evidence cholecystitis. 6.  Calcific coronary artery and aortic atherosclerosis. Aortic Atherosclerosis (ICD10-I70.0). Electronically Signed   By: Claudie Revering M.D.   On: 08/05/2018 15:40   Ct Abdomen Pelvis W Contrast  Result Date: 08/05/2018 CLINICAL DATA:  Respiratory distress, diaphoresis and lethargy. Intubated. Acute, generalized abdominal pain. EXAM: CT ANGIOGRAPHY CHEST CT ABDOMEN AND PELVIS WITH CONTRAST TECHNIQUE: Multidetector CT imaging of the chest was performed using the standard protocol during bolus administration of intravenous contrast. Multiplanar CT image reconstructions and MIPs were obtained to evaluate the vascular anatomy. Multidetector CT imaging of the abdomen and pelvis was performed using the standard protocol during bolus administration of intravenous contrast. CONTRAST:  56mL ISOVUE-370 IOPAMIDOL (ISOVUE-370) INJECTION 76% COMPARISON:  None. FINDINGS: CTA CHEST FINDINGS Cardiovascular: Normally opacified pulmonary arteries with no pulmonary arterial filling defects seen. Atheromatous calcifications, including the coronary arteries and aorta. Borderline enlarged heart. No pericardial effusion. Mediastinum/Nodes: Mildly prominent precarinal node with a short axis diameter of 10 mm on image number 40 series 4. Mildly enlarged right hilar nodes, the largest with a short axis diameter of 11 mm on image number 54 series 4. Enlarged left hilar nodes, the largest with a short axis diameter of 10 mm on image number 52 series 4. Endotracheal 2 tip 2.7 cm above the carina. Nasogastric tube extending into the stomach. Lungs/Pleura: Dense, patchy and confluent airspace opacity in both lower lobes. Diffusely prominent interstitial markings with septal edema.  Small bilateral pleural effusions. Musculoskeletal:  Thoracic and lower cervical spine degenerative changes. Review of the MIP images confirms the above findings. CT ABDOMEN and PELVIS FINDINGS Hepatobiliary: Multiple gallstones in the gallbladder measuring up to 2.0 cm in maximum diameter each. No gallbladder wall thickening or pericholecystic fluid. Normal appearing liver. Pancreas: Unremarkable. No pancreatic ductal dilatation or surrounding inflammatory changes. Spleen: Normal in size without focal abnormality. Adrenals/Urinary Tract: Small bilateral renal cysts. Normal appearing adrenal glands and ureters. Foley catheter in the urinary bladder with associated air in the bladder. Stomach/Bowel: Single distal descending colon diverticulum. Nasogastric tube tip in the mid to proximal stomach. Unremarkable small bowel. No evidence of appendicitis. Vascular/Lymphatic: Atheromatous arterial calcifications without aneurysm. No enlarged lymph nodes. Reproductive: Normal sized uterus with multiple small peripheral calcifications. No adnexal mass. Other: No abdominal wall hernia or abnormality. No abdominopelvic ascites. Musculoskeletal: Lumbar and lower thoracic spine degenerative changes. Review of the MIP images confirms the above findings. IMPRESSION: 1. No pulmonary emboli. 2. Dense, patchy and confluent airspace opacity in both lower lobes. This is most likely due to pneumonia. Patchy atelectasis is less likely. 3. Borderline cardiomegaly with changes of acute congestive heart failure. 4. Mild mediastinal and bilateral hilar adenopathy, most likely reactive. 5. Cholelithiasis without evidence cholecystitis. 6.  Calcific coronary artery and aortic atherosclerosis. Aortic Atherosclerosis (ICD10-I70.0). Electronically Signed   By: Claudie Revering M.D.   On: 08/05/2018 15:40   Dg Chest Port 1 View  Result Date: 08/06/2018 CLINICAL DATA:  Pneumonia, abnormal blood gas EXAM: PORTABLE CHEST 1 VIEW COMPARISON:  08/05/2018 FINDINGS: Endotracheal tube terminates 6.5 cm above  the carina. Enteric tube courses into the proximal gastric body. Mild left basilar atelectasis. Possible small left pleural effusion. Right lung is essentially clear. No pneumothorax. The heart is normal in size. Thoracic aortic atherosclerosis. IMPRESSION: Endotracheal tube terminates 6.5 cm above the carina. Mild left basilar atelectasis with possible small left pleural effusion. Electronically Signed   By: Julian Hy M.D.   On: 08/06/2018 08:08   Dg Chest Port 1 View  Result Date: 08/05/2018 CLINICAL DATA:  ET and OG tube placement EXAM: PORTABLE CHEST 1 VIEW COMPARISON:  August 05, 2018 FINDINGS: Bilateral primarily interstitial pulmonary infiltrates remain. This was thought to represent pneumonia on the CT scan from earlier today. No pneumothorax. The ETT terminates in the mid trachea. The NG tube terminates with the side port near the GE junction in the distal tip in the stomach. The heart size is borderline. The hila and mediastinum are unremarkable. No pulmonary nodules or masses. IMPRESSION: 1. The ETT is in good position. 2. The side port of the NG tube is near the GE junction. Consider advancement. 3. Interstitial pulmonary infiltrates remain. This was thought to represent pneumonia on today's CT scan. Electronically Signed   By: Dorise Bullion III M.D   On: 08/05/2018 20:40   Dg Chest Port 1 View  Result Date: 08/05/2018 CLINICAL DATA:  Patient poor historian at this time. Unable to obtain hx at this time. Encounter for SOB. EXAM: PORTABLE CHEST - 1 VIEW COMPARISON:  02/16/2015 FINDINGS: Moderate interstitial edema or infiltrates and central pulmonary vascular congestion, new since previous. Heart size upper limits normal. Aortic Atherosclerosis (ICD10-170.0). No effusion.  No pneumothorax. Visualized bones unremarkable. IMPRESSION: 1. Moderate bilateral interstitial edema or infiltrates, new since previous. Electronically Signed   By: Lucrezia Europe M.D.   On: 08/05/2018 14:25   Dg Abd  Portable 1v  Result Date: 08/05/2018 CLINICAL DATA:  NG tube  placement. EXAM: PORTABLE ABDOMEN - 1 VIEW COMPARISON:  Current abdomen pelvis CT. FINDINGS: Nasogastric tube extends below the diaphragm with its tip in the proximal stomach. Side hole of the tube lies in the distal esophagus. IMPRESSION: NG tube tip in the proximal stomach. Electronically Signed   By: Lajean Manes M.D.   On: 08/05/2018 19:26     ASSESSMENT AND PLAN:  77 year old female patient currently in the ICU for respiratory failure on ventilator  -Acute hypoxic respiratory failure with hypercapnia Continue mechanical ventilation Vent bundle as per critical care attending  -Acute COPD exacerbation Nebulization treatments and IV steroids along with empiric antibiotics  -Sepsis secondary to pneumonia Continue IV vancomycin and Zosyn antibiotics Follow procalcitonin, lactic acid and cultures  -Pneumonia CTA chest no pulmonary embolism Mediastinal and hilar adenopathy probably reactive in etiology Continue broad-spectrum antibiotics and follow-up cultures  -Cholelithiasis No evidence of any inflammation  -Type 2 diabetes mellitus Glycemic control strictly  -Reactive thrombocytosis improving  All the records are reviewed and case discussed with Care Management/Social Worker. Management plans discussed with the patient, family and they are in agreement.  CODE STATUS: Full code  DVT Prophylaxis: SCDs  TOTAL TIME TAKING CARE OF THIS PATIENT: 35 minutes.   POSSIBLE D/C IN 3 to 4 DAYS, DEPENDING ON CLINICAL CONDITION.  Saundra Shelling M.D on 08/06/2018 at 11:22 AM  Between 7am to 6pm - Pager - [phone]  After 6pm go to [email or web address] - password [email or web address] [facility]  Office  [phone]  CC: Primary care physician; System, Pcp Not In  Note: This dictation was prepared with Dragon dictation along with smaller phrase technology. Any transcriptional errors that result from this process are  unintentional.

## 2018-08-06 NOTE — Progress Notes (Signed)
   08/06/18 2205  Clinical Encounter Type  Visited With Patient;Family  Visit Type Follow-up  Spiritual Encounters  Spiritual Needs Prayer   Chaplain offered silent and energetic prayers while patient slept.  Chaplain followed up with family in waiting room, provided warm blankets upon request and encouraged family to page chaplain as needed.

## 2018-08-06 NOTE — Progress Notes (Signed)
   08/06/18 1550  Clinical Encounter Type  Visited With Patient and family together  Visit Type Initial (order request for prayer)  Referral From Nurse  Consult/Referral To [facility] [place] prayed with patient's family members at bedside.  Family wanting prayers for healing and support.  Chaplain reviewed support services and encouraged family to have chaplain paged as needed.  Chaplain also met family in waiting room to assess their needs; chaplain to check in later this evening.

## 2018-08-06 NOTE — Progress Notes (Signed)
Initial Nutrition Assessment  DOCUMENTATION CODES:   Not applicable  INTERVENTION:  Initiate Vital High Protein at 30 mL/hr (720 mL goal daily volume) + Pro-Stat 30 mL TID via OGT. Provides 1020 kcal, 108 grams of protein, 605 mL H2O daily. With current propofol rate provides 1479 kcal daily.  Provide liquid MVI daily per tube as goal TF regimen does not meet 100% RDIs for vitamins/minerals.  Provide minimum free water flush of 30 mL Q4hrs to maintain tube patency. Patient receiving LR @ 50 ml/hr.  NUTRITION DIAGNOSIS:   Inadequate oral intake related to inability to eat as evidenced by NPO status.  GOAL:   Provide needs based on ASPEN/SCCM guidelines  MONITOR:   Vent status, Labs, Weight trends, TF tolerance, I & O's  REASON FOR ASSESSMENT:   Ventilator    ASSESSMENT:   77 year old female with PMHx of DM, GERD, HTN, HLD, vitamin D deficiency, chronic tobacco abuse, COPD who is admitted with acute respiratory failure with hypoxemia and hypercapnia secondary to acute exacerbation of COPD requiring intubation 8/10, PNA, sepsis.   Patient intubated and sedated. On PRVC mode with FiO2 30%. Receiving LR @ 50 mL/hr. No family members at bedside at time of RD assessment. Patient currently 83 kg. She was 76.2 kg on 03/10/2017.  Access: OGT placed 8/10; terminates in proximal gastric body per chest x-ray 8/11  MAP: 59-83 mmHg  Patient is currently intubated on ventilator support Ve: 7.9 L/min Temp (24hrs), Avg:97.7 F (36.5 C), Min:95.8 F (35.4 C), Max:98.8 F (37.1 C)  Propofol: 17.4 ml/hr (459 kcal daily)  Medications reviewed and include: Solu-Medrol 40 mg Q8hrs IV, pantoprazole, azithromycin, fentanyl gtt, LR @ 50 mL/hr, phenylephrine gtt at 5 mcg/min, Zosyn, propofol gtt, vancomycin.  Labs reviewed: Potassium 5.4, CO2 21, Creatinine 1.64, Phosphorus 5.  I/O: 1100 mL UOP yesterday  Patient does not meet criteria for malnutrition at this time.  Discussed with RN.  Also discussed with MD over phone. Plan is to start enteral nutrition today.  NUTRITION - FOCUSED PHYSICAL EXAM:    Most Recent Value  Orbital Region  No depletion  Upper Arm Region  No depletion  Thoracic and Lumbar Region  No depletion  Buccal Region  Unable to assess  Temple Region  No depletion  Clavicle Bone Region  No depletion  Clavicle and Acromion Bone Region  No depletion  Scapular Bone Region  Unable to assess  Dorsal Hand  No depletion  Patellar Region  No depletion  Anterior Thigh Region  No depletion  Posterior Calf Region  No depletion  Edema (RD Assessment)  -- [non-pitting to upper extremities]  Hair  Reviewed  Eyes  Unable to assess  Mouth  Unable to assess  Skin  Reviewed  Nails  Reviewed     Diet Order:   Diet Order            Diet NPO time specified  Diet effective now              EDUCATION NEEDS:   No education needs have been identified at this time  Skin:  Skin Assessment: Reviewed RN Assessment  Last BM:  Unknown/PTA  Height:   Ht Readings from Last 1 Encounters:  08/05/18 5\' 7"  (1.702 m)    Weight:   Wt Readings from Last 1 Encounters:  08/05/18 83 kg    Ideal Body Weight:  61.4 kg  BMI:  Body mass index is 28.66 kg/m.  Estimated Nutritional Needs:   Kcal:  1527 (PSU 2003b w/ MSJ 1353, Ve 7.9, Tmax 37.1)  Protein:  100-125 grams (1.2-1.5 grams/kg)  Fluid:  1.5-1.8 L/day (25-30 mL/kg IBW)  Willey Blade, MS, RD, LDN Office: [phone] Pager: [phone] After Hours/Weekend Pager: [phone]

## 2018-08-06 NOTE — Progress Notes (Signed)
ETT tube advanced from 21 to 23 at lips after reviewing CXR and verification with MD. RN present in room for ETT advancement. Patient tolerated tube advancement well with no complications.

## 2018-08-06 NOTE — Progress Notes (Signed)
Name: Brittney [name] MRN: [id number] DOB: 03-08-1941     CONSULTATION DATE: 08/05/2018  Subjective & Objectives: Remains on vent, sedated and on Neo-synephrine. Noticed to have bloody aspirate via OGT.  PAST MEDICAL HISTORY :   has a past medical history of Diabetes mellitus without complication ([place]), GERD (gastroesophageal reflux disease), Hyperlipidemia, Hypertension, and Vitamin D deficiency.  has a past surgical history that includes Colonoscopy. Prior to Admission medications   Medication Sig Start Date End Date Taking? Authorizing Provider  amLODipine (NORVASC) 10 MG tablet Take 10 mg by mouth daily.   Yes [provider]  aspirin 81 MG tablet Take 81 mg by mouth daily.   Yes [provider]  atorvastatin (LIPITOR) 40 MG tablet Take 1 tablet by mouth daily. 07/04/18  Yes [provider]  cetirizine (ZYRTEC) 10 MG tablet Take 10 mg by mouth daily.    Yes [provider]  glipiZIDE (GLUCOTROL XL) 10 MG 24 hr tablet Take 10 mg by mouth 2 (two) times daily.   Yes [provider]  Liraglutide (VICTOZA) 18 MG/3ML SOPN Inject 1.8 mg into the skin every morning.   Yes [provider]  losartan (COZAAR) 100 MG tablet Take 100 mg by mouth daily.   Yes [provider]  metFORMIN (GLUCOPHAGE) 1000 MG tablet Take 1,000 mg by mouth 2 (two) times daily with a meal.   Yes [provider]  omeprazole (PRILOSEC) 20 MG capsule Take 20 mg by mouth daily.   Yes [provider]  ranitidine (ZANTAC) 150 MG tablet Take 1 tablet by mouth 2 (two) times daily. 05/06/18  Yes [provider]  sitaGLIPtin (JANUVIA) 100 MG tablet Take 100 mg by mouth daily.   Yes [provider]  albuterol (PROVENTIL HFA;VENTOLIN HFA) 108 (90 BASE) MCG/ACT inhaler Inhale 2 puffs into the lungs every 6 (six) hours as needed for wheezing or shortness of breath.     [provider]   Allergies  Allergen Reactions  .  Accupril [Quinapril Hcl]     FAMILY HISTORY:  family history is not on file. SOCIAL HISTORY:  reports that she has been smoking cigarettes. She has a 40.00 pack-year smoking history. She has never used smokeless tobacco. She reports that she does not drink alcohol or use drugs.  REVIEW OF SYSTEMS:   Unable to obtain due to critical illness   VITAL SIGNS: Temp:  [95.8 F (35.4 C)-98.8 F (37.1 C)] 98.2 F (36.8 C) (08/11 1100) Pulse Rate:  [56-139] 84 (08/11 1100) Resp:  [18-39] 21 (08/11 1100) BP: (73-204)/(44-153) 117/57 (08/11 1100) SpO2:  [94 %-100 %] 99 % (08/11 1100) FiO2 (%):  [30 %-60 %] 30 % (08/11 0828) Weight:  [83 kg-83.9 kg] 83 kg (08/10 1802)  Physical Examination:  Sedated with propofol to RASS of -3 On vent, no distress, bilateral equal air entry and no rales S1 & S2 are audible with no murmur Benign abdominal exam with normal peristalsis Within normal extremities and no peripheral edema   ASSESSMENT / PLAN:  Acute respiratory failure with hypoxemia and hypercapnia. Intubated on 08/05/2018. -SAT + SBT as tolerated. -Monitor ABG, optimize ventilator settings and continuous vent support  Acute exacerbation of COPD -Optimize bronchodilators + inhaled steroids + tapering systemic steroids + empiric antibiotics  Pneumonia.  CT chest ruled out PE. improved bibasilar infiltrate, mild mediastinal and bilateral hilar adenopathy more likely reactive with mild pulmonary congestion. -Empiric Zosyn + Zithromax.d/c Vanc MRSA PCR -ve.  -Monitor CXR + CBC +  FiO2 and cultures  Septic shock. On Neosynephrine. (lactic acid 7.2 to 3.4) -Empiric Zosyn.  Monitor procalcitonin, lactic acid and cultures -Optimize hydration, pressers and monitor hemodynamics.  AKI, hyperkalemia and non anion gap metabolic acidosis with intravascular volume depletion -Optimize hydration, avoid nephrototoxins, monitor renal panel and urine out put. -d/c Propofol -Renal U/S  GI bleeding  with bloody aspirate via OGT. -d/c Lovenox and ASA -Protonix. And follow with GI consult  Diabetes mellitus -Glycemic control -Monitor hemoglobin A1c  Dyslipidemia -Statin  GERD -PPI  Reactive thrombocytosis -Monitor platelets  Cholelithiasis with no signs of acute cholecystitis  Borderline cardiomegaly -Follow with echocardiogram  Full code  DVT & GI prophylaxis.  Continue with supportive care  Family were updated at the bedside  Critical care time 45 minutes

## 2018-08-06 NOTE — Progress Notes (Signed)
Patient continued on Fentanyl, Propofol, LR and Neo.  Called [facility] at beginning of the shift for medication for hypotension. Neo ordered for BP control. Down to 40% on the vent, was 60%. CXR and KUB were done shortly after change of shift since RT said a follow up xray and abg was not done post intubation and the documented ED length of 21 was now 24. RT noted she would place ETT back to 21 at the lip. ETT was at correct position and OG needed to be advanced. OG advanced and had yellow, green then brown bile colored output. Has not been very responsive and not following commands. Family has been in and out the unit to see patient. Output low in Foley and no BM this shift. No other concerns at this time. Continue to monitor.

## 2018-08-07 ENCOUNTER — Inpatient Hospital Stay: Payer: Medicare HMO

## 2018-08-07 ENCOUNTER — Inpatient Hospital Stay: Payer: Self-pay

## 2018-08-07 DIAGNOSIS — J181 Lobar pneumonia, unspecified organism: Secondary | ICD-10-CM

## 2018-08-07 DIAGNOSIS — J9621 Acute and chronic respiratory failure with hypoxia: Secondary | ICD-10-CM

## 2018-08-07 DIAGNOSIS — J9622 Acute and chronic respiratory failure with hypercapnia: Secondary | ICD-10-CM

## 2018-08-07 LAB — RESPIRATORY PANEL BY PCR

## 2018-08-07 LAB — BLOOD GAS, ARTERIAL
Acid-base deficit: 3.2 mmol/L — ABNORMAL HIGH (ref 0.0–2.0)
Bicarbonate: 22 mmol/L (ref 20.0–28.0)
FIO2: 0.3
MECHVT: 450 mL
Mechanical Rate: 20
O2 Saturation: 97.8 %
PEEP: 5 cmH2O
Patient temperature: 37
pCO2 arterial: 39 mmHg (ref 32.0–48.0)
pH, Arterial: 7.36 (ref 7.350–7.450)
pO2, Arterial: 104 mmHg (ref 83.0–108.0)

## 2018-08-07 LAB — GLUCOSE, CAPILLARY
Glucose-Capillary: 100 mg/dL — ABNORMAL HIGH (ref 70–99)
Glucose-Capillary: 127 mg/dL — ABNORMAL HIGH (ref 70–99)
Glucose-Capillary: 127 mg/dL — ABNORMAL HIGH (ref 70–99)
Glucose-Capillary: 138 mg/dL — ABNORMAL HIGH (ref 70–99)
Glucose-Capillary: 147 mg/dL — ABNORMAL HIGH (ref 70–99)
Glucose-Capillary: 166 mg/dL — ABNORMAL HIGH (ref 70–99)
Glucose-Capillary: 169 mg/dL — ABNORMAL HIGH (ref 70–99)
Glucose-Capillary: 174 mg/dL — ABNORMAL HIGH (ref 70–99)
Glucose-Capillary: 175 mg/dL — ABNORMAL HIGH (ref 70–99)
Glucose-Capillary: 175 mg/dL — ABNORMAL HIGH (ref 70–99)
Glucose-Capillary: 195 mg/dL — ABNORMAL HIGH (ref 70–99)
Glucose-Capillary: 202 mg/dL — ABNORMAL HIGH (ref 70–99)
Glucose-Capillary: 214 mg/dL — ABNORMAL HIGH (ref 70–99)
Glucose-Capillary: 225 mg/dL — ABNORMAL HIGH (ref 70–99)

## 2018-08-07 LAB — CBC WITH DIFFERENTIAL/PLATELET
Basophils Absolute: 0 10*3/uL (ref 0–0.1)
Basophils Relative: 0 %
Eosinophils Absolute: 0 10*3/uL (ref 0–0.7)
Eosinophils Relative: 0 %
HCT: 34.1 % — ABNORMAL LOW (ref 35.0–47.0)
Hemoglobin: 11.7 g/dL — ABNORMAL LOW (ref 12.0–16.0)
Lymphocytes Relative: 8 %
Lymphs Abs: 1 10*3/uL (ref 1.0–3.6)
MCH: 27.8 pg (ref 26.0–34.0)
MCHC: 34.3 g/dL (ref 32.0–36.0)
MCV: 81 fL (ref 80.0–100.0)
Monocytes Absolute: 1.4 10*3/uL — ABNORMAL HIGH (ref 0.2–0.9)
Monocytes Relative: 11 %
Neutro Abs: 10.5 10*3/uL — ABNORMAL HIGH (ref 1.4–6.5)
Neutrophils Relative %: 81 %
Platelets: 339 10*3/uL (ref 150–440)
RBC: 4.21 MIL/uL (ref 3.80–5.20)
RDW: 14.8 % — ABNORMAL HIGH (ref 11.5–14.5)
WBC: 12.9 10*3/uL — ABNORMAL HIGH (ref 3.6–11.0)

## 2018-08-07 LAB — ECHOCARDIOGRAM COMPLETE
Height: 67 in
Weight: 2927.71 oz

## 2018-08-07 LAB — BASIC METABOLIC PANEL
Anion gap: 6 (ref 5–15)
BUN: 24 mg/dL — ABNORMAL HIGH (ref 8–23)
CO2: 23 mmol/L (ref 22–32)
Calcium: 8.1 mg/dL — ABNORMAL LOW (ref 8.9–10.3)
Chloride: 112 mmol/L — ABNORMAL HIGH (ref 98–111)
Creatinine, Ser: 1.33 mg/dL — ABNORMAL HIGH (ref 0.44–1.00)
GFR calc Af Amer: 43 mL/min — ABNORMAL LOW (ref >60)
GFR calc non Af Amer: 37 mL/min — ABNORMAL LOW (ref >60)
Glucose, Bld: 130 mg/dL — ABNORMAL HIGH (ref 70–99)
Potassium: 4 mmol/L (ref 3.5–5.1)
Sodium: 141 mmol/L (ref 135–145)

## 2018-08-07 LAB — URINE CULTURE: Culture: >=100000 — AB

## 2018-08-07 LAB — HEMOGLOBIN AND HEMATOCRIT, BLOOD
HCT: 35.4 % (ref 35.0–47.0)
Hemoglobin: 12.1 g/dL (ref 12.0–16.0)

## 2018-08-07 LAB — LEGIONELLA PNEUMOPHILA SEROGP 1 UR AG: L. pneumophila Serogp 1 Ur Ag: NEGATIVE

## 2018-08-07 LAB — PROCALCITONIN: Procalcitonin: 3.31 ng/mL

## 2018-08-07 LAB — PHOSPHORUS: Phosphorus: 3.1 mg/dL (ref 2.5–4.6)

## 2018-08-07 LAB — LACTIC ACID, PLASMA: Lactic Acid, Venous: 1.6 mmol/L (ref 0.5–1.9)

## 2018-08-07 LAB — MAGNESIUM: Magnesium: 2.1 mg/dL (ref 1.7–2.4)

## 2018-08-07 MED ORDER — MIDAZOLAM HCL 2 MG/2ML IJ SOLN: 1.0000 mg | INTRAMUSCULAR | Status: DC | PRN

## 2018-08-07 MED ORDER — ORAL CARE MOUTH RINSE
15.0000 mL | Freq: Two times a day (BID) | OROMUCOSAL | Status: DC
  Administered 2018-08-07: 15 mL via OROMUCOSAL

## 2018-08-07 MED ORDER — INSULIN ASPART 100 UNIT/ML ~~LOC~~ SOLN: 0.0000 [IU] | SUBCUTANEOUS | Status: DC

## 2018-08-07 MED ORDER — ATORVASTATIN CALCIUM 20 MG PO TABS
40.0000 mg | ORAL_TABLET | Freq: Every day | ORAL | Status: DC
  Administered 2018-08-08 – 2018-08-09 (×2): 40 mg
  Filled 2018-08-07 (×2): qty 2

## 2018-08-07 MED ORDER — MIDAZOLAM HCL 2 MG/2ML IJ SOLN
1.0000 mg | INTRAMUSCULAR | Status: AC | PRN
  Administered 2018-08-07 (×3): 1 mg via INTRAVENOUS
  Filled 2018-08-07 (×3): qty 2

## 2018-08-07 MED ORDER — ROCURONIUM BROMIDE 50 MG/5ML IV SOLN
INTRAVENOUS | Status: AC
  Administered 2018-08-07: 50 mg via INTRAVENOUS
  Filled 2018-08-07: qty 1

## 2018-08-07 MED ORDER — FENTANYL CITRATE (PF) 100 MCG/2ML IJ SOLN
50.0000 ug | INTRAMUSCULAR | Status: DC | PRN
  Administered 2018-08-07: 50 ug via INTRAVENOUS

## 2018-08-07 MED ORDER — HYDRALAZINE HCL 20 MG/ML IJ SOLN
10.0000 mg | INTRAMUSCULAR | Status: DC | PRN
  Administered 2018-08-07 – 2018-08-08 (×3): 20 mg via INTRAVENOUS
  Administered 2018-08-08: 10 mg via INTRAVENOUS
  Administered 2018-08-08 – 2018-08-09 (×3): 20 mg via INTRAVENOUS
  Administered 2018-08-09: 40 mg via INTRAVENOUS
  Administered 2018-08-10: 20 mg via INTRAVENOUS
  Administered 2018-08-11: 10 mg via INTRAVENOUS
  Filled 2018-08-07 (×4): qty 1
  Filled 2018-08-07 (×2): qty 2
  Filled 2018-08-07 (×5): qty 1

## 2018-08-07 MED ORDER — SODIUM CHLORIDE 0.9 % IV SOLN
INTRAVENOUS | Status: DC
  Administered 2018-08-07: 1.9 [IU]/h via INTRAVENOUS
  Filled 2018-08-07: qty 1

## 2018-08-07 MED ORDER — ONDANSETRON HCL 4 MG/2ML IJ SOLN
4.0000 mg | Freq: Once | INTRAMUSCULAR | Status: AC
  Administered 2018-08-07: 4 mg via INTRAVENOUS

## 2018-08-07 MED ORDER — DEXTROSE 10 % IV SOLN: INTRAVENOUS | Status: DC | PRN

## 2018-08-07 MED ORDER — DEXTROSE IN LACTATED RINGERS 5 % IV SOLN
INTRAVENOUS | Status: DC
  Administered 2018-08-07: 11:00:00 via INTRAVENOUS

## 2018-08-07 MED ORDER — METOPROLOL TARTRATE 5 MG/5ML IV SOLN
2.5000 mg | INTRAVENOUS | Status: DC | PRN
  Administered 2018-08-07 (×2): 2.5 mg via INTRAVENOUS
  Administered 2018-08-07: 5 mg via INTRAVENOUS
  Administered 2018-08-08: 2.5 mg via INTRAVENOUS
  Administered 2018-08-08: 5 mg via INTRAVENOUS
  Filled 2018-08-07 (×5): qty 5

## 2018-08-07 MED ORDER — CHLORHEXIDINE GLUCONATE 0.12% ORAL RINSE (MEDLINE KIT): 15.0000 mL | Freq: Two times a day (BID) | OROMUCOSAL | Status: DC

## 2018-08-07 MED ORDER — METOCLOPRAMIDE HCL 5 MG/ML IJ SOLN
5.0000 mg | Freq: Four times a day (QID) | INTRAMUSCULAR | Status: DC | PRN
  Administered 2018-08-07: 5 mg via INTRAVENOUS

## 2018-08-07 MED ORDER — INSULIN ASPART 100 UNIT/ML ~~LOC~~ SOLN
3.0000 [IU] | SUBCUTANEOUS | Status: DC
  Administered 2018-08-07 (×2): 6 [IU] via SUBCUTANEOUS
  Filled 2018-08-07 (×2): qty 1

## 2018-08-07 MED ORDER — FENTANYL CITRATE (PF) 100 MCG/2ML IJ SOLN
50.0000 ug | INTRAMUSCULAR | Status: AC | PRN
  Administered 2018-08-07 (×3): 50 ug via INTRAVENOUS
  Filled 2018-08-07 (×3): qty 2

## 2018-08-07 MED ORDER — ROCURONIUM BROMIDE 50 MG/5ML IV SOLN
50.0000 mg | Freq: Once | INTRAVENOUS | Status: AC
  Administered 2018-08-07: 50 mg via INTRAVENOUS

## 2018-08-07 MED ORDER — INSULIN GLARGINE 100 UNIT/ML ~~LOC~~ SOLN: 5.0000 [IU] | Freq: Every day | SUBCUTANEOUS | Status: DC

## 2018-08-07 MED ORDER — INSULIN DETEMIR 100 UNIT/ML ~~LOC~~ SOLN
5.0000 [IU] | Freq: Two times a day (BID) | SUBCUTANEOUS | Status: DC
  Filled 2018-08-07: qty 0.05

## 2018-08-07 MED ORDER — LABETALOL HCL 5 MG/ML IV SOLN
10.0000 mg | Freq: Once | INTRAVENOUS | Status: AC
  Administered 2018-08-07: 10 mg via INTRAVENOUS

## 2018-08-07 MED ORDER — ETOMIDATE 2 MG/ML IV SOLN
INTRAVENOUS | Status: AC
  Administered 2018-08-07: 20 mg via INTRAVENOUS
  Filled 2018-08-07: qty 10

## 2018-08-07 MED ORDER — INSULIN ASPART 100 UNIT/ML ~~LOC~~ SOLN
0.0000 [IU] | SUBCUTANEOUS | Status: DC
  Administered 2018-08-07: 5 [IU] via SUBCUTANEOUS
  Administered 2018-08-07: 3 [IU] via SUBCUTANEOUS
  Administered 2018-08-07: 5 [IU] via SUBCUTANEOUS
  Administered 2018-08-08: 3 [IU] via SUBCUTANEOUS
  Administered 2018-08-08 (×2): 5 [IU] via SUBCUTANEOUS
  Administered 2018-08-08 (×2): 3 [IU] via SUBCUTANEOUS
  Administered 2018-08-08: 5 [IU] via SUBCUTANEOUS
  Administered 2018-08-09 (×2): 3 [IU] via SUBCUTANEOUS
  Filled 2018-08-07 (×10): qty 1

## 2018-08-07 MED ORDER — ORAL CARE MOUTH RINSE
15.0000 mL | OROMUCOSAL | Status: DC
  Administered 2018-08-07 – 2018-08-09 (×17): 15 mL via OROMUCOSAL

## 2018-08-07 MED ORDER — ETOMIDATE 2 MG/ML IV SOLN
20.0000 mg | Freq: Once | INTRAVENOUS | Status: AC
  Administered 2018-08-07: 20 mg via INTRAVENOUS

## 2018-08-07 MED ORDER — FENTANYL 2500MCG IN NS 250ML (10MCG/ML) PREMIX INFUSION
0.0000 ug/h | INTRAVENOUS | Status: DC
  Administered 2018-08-07: 25 ug/h via INTRAVENOUS
  Filled 2018-08-07: qty 250

## 2018-08-07 MED ORDER — INSULIN ASPART 100 UNIT/ML ~~LOC~~ SOLN: 1.0000 [IU] | SUBCUTANEOUS | Status: DC

## 2018-08-07 MED ORDER — INSULIN GLARGINE 100 UNIT/ML ~~LOC~~ SOLN
5.0000 [IU] | Freq: Once | SUBCUTANEOUS | Status: AC
  Administered 2018-08-07: 5 [IU] via SUBCUTANEOUS
  Filled 2018-08-07 (×2): qty 0.05

## 2018-08-07 NOTE — Progress Notes (Signed)
Inpatient Diabetes Program Recommendations  AACE/ADA: New Consensus Statement on Inpatient Glycemic Control (2015)  Target Ranges:  Prepandial:   less than 140 mg/dL      Peak postprandial:   less than 180 mg/dL (1-2 hours)      Critically ill patients:  140 - 180 mg/dL   Results for Brittney [name], Brittney [name] (MRN [id number]) as of 08/07/2018 08:52  Ref. Range 08/07/2018 00:20 08/07/2018 01:25 08/07/2018 02:27 08/07/2018 03:30 08/07/2018 04:15 08/07/2018 05:35 08/07/2018 06:38 08/07/2018 07:25 08/07/2018 08:30  Glucose-Capillary Latest Ref Range: 70 - 99 mg/dL 225 (H) 175 (H) 175 (H) 147 (H) 138 (H) 127 (H) 100 (H) 127 (H) 166 (H)    Admit with: Sepsis/ COPD/ Pneumonia/ Resp Failure/ Hyperglycemia  History: DM, COPD  Home DM Meds: Januvia 100 mg daily       Glipizide 10 mg BID       Metformin 1000 mg BID       Victoza 1.8 mg daily  Current Orders: IV Insulin Drip (started yesterday at 7pm)      Lantus 5 units X 1 dose      Novolog 3-6-9 units Q4 hours     Patient remains Intubated.  Getting Solumedrol 40 mg BID.  Tube feeds not yet started.      MD- Note patient preparing to transition off the IV Insulin drip this AM.  Orders placed for Lantus 5 units X 1 dose + Novolog SSI.  Please make sure to place orders for standing Lantus daily as well.    --Will follow patient during hospitalization--  Wyn Quaker RN, MSN, CDE Diabetes Coordinator Inpatient Glycemic Control Team Team Pager: [phone] (8a-5p)

## 2018-08-07 NOTE — Care Management (Signed)
RNCM confirmed with [facility] that patient is followed there [phone]. She does not have another appointment at this time.

## 2018-08-07 NOTE — Progress Notes (Addendum)
Name: Brittney [name] MRN: [id number] DOB: 1941-03-25     CONSULTATION DATE: 08/05/2018  Subjective & Objectives: Extubated this morning 8/12 On 2L [place] Afebrile Reports intermittent Nausea & Vomiting  PAST MEDICAL HISTORY :   has a past medical history of Diabetes mellitus without complication ([place]), GERD (gastroesophageal reflux disease), Hyperlipidemia, Hypertension, and Vitamin D deficiency.  has a past surgical history that includes Colonoscopy. Prior to Admission medications   Medication Sig Start Date End Date Taking? Authorizing Provider  amLODipine (NORVASC) 10 MG tablet Take 10 mg by mouth daily.   Yes [provider]  aspirin 81 MG tablet Take 81 mg by mouth daily.   Yes [provider]  atorvastatin (LIPITOR) 40 MG tablet Take 1 tablet by mouth daily. 07/04/18  Yes [provider]  cetirizine (ZYRTEC) 10 MG tablet Take 10 mg by mouth daily.    Yes [provider]  glipiZIDE (GLUCOTROL XL) 10 MG 24 hr tablet Take 10 mg by mouth 2 (two) times daily.   Yes [provider]  Liraglutide (VICTOZA) 18 MG/3ML SOPN Inject 1.8 mg into the skin every morning.   Yes [provider]  losartan (COZAAR) 100 MG tablet Take 100 mg by mouth daily.   Yes [provider]  metFORMIN (GLUCOPHAGE) 1000 MG tablet Take 1,000 mg by mouth 2 (two) times daily with a meal.   Yes [provider]  omeprazole (PRILOSEC) 20 MG capsule Take 20 mg by mouth daily.   Yes [provider]  ranitidine (ZANTAC) 150 MG tablet Take 1 tablet by mouth 2 (two) times daily. 05/06/18  Yes [provider]  sitaGLIPtin (JANUVIA) 100 MG tablet Take 100 mg by mouth daily.   Yes [provider]  albuterol (PROVENTIL HFA;VENTOLIN HFA) 108 (90 BASE) MCG/ACT inhaler Inhale 2 puffs into the lungs every 6 (six) hours as needed for wheezing or shortness of breath.     [provider]   Allergies  Allergen Reactions  .  Accupril [Quinapril Hcl]     FAMILY HISTORY:  family history is not on file. SOCIAL HISTORY:  reports that she has been smoking cigarettes. She has a 40.00 pack-year smoking history. She has never used smokeless tobacco. She reports that she does not drink alcohol or use drugs.  REVIEW OF SYSTEMS:   Positives in BOLD: Gen: Denies fever, chills, weight change, fatigue, night sweats HEENT: Denies blurred vision, double vision, hearing loss, tinnitus, sinus congestion, rhinorrhea, sore throat, neck stiffness, dysphagia PULM: Denies shortness of breath, +cough, sputum production, hemoptysis, wheezing CV: Denies chest pain, edema, orthopnea, paroxysmal nocturnal dyspnea, palpitations GI: Denies abdominal pain, +nausea, +vomiting, diarrhea, hematochezia, melena, constipation, change in bowel habits GU: Denies dysuria, hematuria, polyuria, oliguria, urethral discharge Endocrine: Denies hot or cold intolerance, polyuria, polyphagia or appetite change Derm: Denies rash, dry skin, scaling or peeling skin change Heme: Denies easy bruising, bleeding, bleeding gums Neuro: Denies headache, numbness, weakness, slurred speech, loss of memory or consciousness    VITAL SIGNS: Temp:  [96.3 F (35.7 C)-100.9 F (38.3 C)] 97.9 F (36.6 C) (08/12 1106) Pulse Rate:  [64-107] 89 (08/12 1106) Resp:  [6-29] 22 (08/12 1106) BP: (54-170)/(40-88) 159/87 (08/12 1106) SpO2:  [96 %-100 %] 100 % (08/12 1106) FiO2 (%):  [30 %] 30 % (08/12 0800) Weight:  [75.5 kg] 75.5 kg (08/12 0411)  Physical Examination:  GENERAL: Chronically ill appearing female, sitting in bed, on 2L [place], in no acute distress, intermittent N/V NEURO: Awake and alert,  Oriented to person and place, follow commands, speech clear, no focal deficits HEENT: Atraumatic, normocephalic, neck supple, no JVD, MM pink/moist CARDIAC: RRR, s1s2, no M/R/G, 2+ pulses throughout PULMONARY: Clear breath sounds bilaterally, even, symmetrical, normal  effort GI: Soft, non-tender, non-distended, BS active x4 MUSCULOSKELETAL: No deformities, active ROM all extremities, SKIN: Warm, dry.  No obvious rashes, lesions, or ulcerations   ASSESSMENT / PLAN:  PULMONARY A: Acute respiratory failure with hypoxemia and hypercapnia. Intubated on 08/05/2018.>>Extubated 08/07/18 -CT Chest negative PE AECOPD Pneumonia P: -Extubated 08/07/18 -Supplemental O2 prn to maintain O2 sats 88-92% -Continue Bronchodilators, ICS, Solu-medrol (currently on 40 mg BID, consider weaning down 8/13) -Continue Azithromycin + Zosyn -Follow cultures -Intermittent CXR   CARDIAC A: Septic shock>>resolved Dyslipidemia P: -Cardiac monitoring -Maintain MAP >65 -Continue D5LR @ 50 ml/hr -Trend PCT and lactic -Continue statin -ECHO pending  INFECTIOUS A: Leukocytosis in setting of UTI & PNA -Urine culture growing E.coli>>pan sensitive P: Monitor fever curve Trend WBC Follow cultures Continue Azithromycin & Zosyn Trend PCT  RENAL A: AKI -Renal US>> No  Obstructive uropathy Non-anion gap metabolic acidosis>>resolved P: Monitor I&O's / urinary output Follow BMP Ensure adequate renal perfusion Avoid nephrotoxic agents as able Replace electrolytes as indicated Continue IVF: D5LR @ 50 ml/hr while NPO  GI A: GI bleed with bloody aspirate via OGT Cholelithiasis with no signs of acute cholecystitis N/V Hx: GERD -NPO -Protonix BID -GI consulted, appreciate input -Prn Zofran -If persistent N/V, consider placing NGT to LIS  ENDOCRINE A: Diabetes Mellitus P: -Monitor CBG's -Converted off insulin gtt>> Lantus 5 units and SSI -Follow ICU hypo/hyperglycemia protocol  HEME A: Anemia  Reactive thrombocytosis>>resolved P: Monitor for s/sx of bleeding Trend CBC SCD's for VTE prophylaxis (pharmacological prophylaxis d/c due to GI bleed) Transfuse for Hgb<7  Disposition: StepDown, can consider transfer out to med/surg floor later  today Family:  Updated pt and pt's daughter at bedside 08/07/18. Goals of care: Full Code   Darel Hong, AGACNP-BC [place] [facility] Pager: [phone]

## 2018-08-07 NOTE — Consult Note (Signed)
Cephas Darby, MD [address]  [place]  [place], [place] [address]  Main: [phone]  Fax: [phone] Pager: [phone]   Consultation  Referring Provider:     No ref. provider found Primary Care Physician:  System, Pcp Not In Primary Gastroenterologist:  Dr. Sherri Sear         Reason for Consultation:  ? Dark bloody aspirate from the OG tube  Date of Admission:  08/05/2018 Date of Consultation:  08/07/2018         HPI:   Brittney [name] is a 77 y.o. female -year-old with history of chronic tobacco use who is admitted on 08/05/2018 to ICU with hypercapnic respiratory failure, on mechanical ventilation. Patient underwent CT chest pain protocol and negative for pulmonary embolism found to have severe airspace disease. Per nursing report, patient was noted to have dark aspirate from the OG tube yesterday. However, according to her nurse today and patient's daughter who is bedside reports that there was dark green liquid that was aspirated. No witnessed episodes of rectal bleeding, hematochezia or melena or hematemesis. Patient was extubated today, however she developed respiratory distress after 2 episodes of bilious emesis. She was reintubated. Currently the OG tube is at low intermittent suction with no further biliary drainage. She had normal LFTs on admission, CT A/P did not reveal evidence of pancreatitis or cholecystitis. Patient's hemoglobin on admission was 14.3, however has been fairly stable around 12 for the last 2 days. She is on pantoprazole 40 mg IV twice a day and kept nothing by mouth.   Patient's daughter reports that she may had ulcers in her stomach about 15 years ago Patient is on omeprazole as outpatient NSAIDs: none  Antiplts/Anticoagulants/Anti thrombotics: aspirin 81  GI Procedures: underwent EGD and colonoscopy several years ago  Past Medical History:  Diagnosis Date  . Diabetes mellitus without complication ([place])   . GERD (gastroesophageal  reflux disease)   . Hyperlipidemia   . Hypertension   . Vitamin D deficiency     Past Surgical History:  Procedure Laterality Date  . COLONOSCOPY      Prior to Admission medications   Medication Sig Start Date End Date Taking? Authorizing Provider  amLODipine (NORVASC) 10 MG tablet Take 10 mg by mouth daily.   Yes [provider]  aspirin 81 MG tablet Take 81 mg by mouth daily.   Yes [provider]  atorvastatin (LIPITOR) 40 MG tablet Take 1 tablet by mouth daily. 07/04/18  Yes [provider]  cetirizine (ZYRTEC) 10 MG tablet Take 10 mg by mouth daily.    Yes [provider]  glipiZIDE (GLUCOTROL XL) 10 MG 24 hr tablet Take 10 mg by mouth 2 (two) times daily.   Yes [provider]  Liraglutide (VICTOZA) 18 MG/3ML SOPN Inject 1.8 mg into the skin every morning.   Yes [provider]  losartan (COZAAR) 100 MG tablet Take 100 mg by mouth daily.   Yes [provider]  metFORMIN (GLUCOPHAGE) 1000 MG tablet Take 1,000 mg by mouth 2 (two) times daily with a meal.   Yes [provider]  omeprazole (PRILOSEC) 20 MG capsule Take 20 mg by mouth daily.   Yes [provider]  ranitidine (ZANTAC) 150 MG tablet Take 1 tablet by mouth 2 (two) times daily. 05/06/18  Yes [provider]  sitaGLIPtin (JANUVIA) 100 MG tablet Take 100 mg by mouth daily.   Yes [provider]  albuterol (PROVENTIL HFA;VENTOLIN  HFA) 108 (90 BASE) MCG/ACT inhaler Inhale 2 puffs into the lungs every 6 (six) hours as needed for wheezing or shortness of breath.     [provider]    Current Facility-Administered Medications:  .  0.9 %  sodium chloride infusion (Manually program via Guardrails IV Fluids), , Intravenous, Once, Samaan, Maged, MD .  acetaminophen (TYLENOL) tablet 650 mg, 650 mg, Oral, Q6H PRN **OR** acetaminophen (TYLENOL) suppository 650 mg, 650 mg, Rectal, Q6H PRN, [name], MD .  Derrill Memo ON 08/08/2018]  atorvastatin (LIPITOR) tablet 40 mg, 40 mg, Per Tube, Daily, Simonds, David B, MD .  chlorhexidine gluconate (MEDLINE KIT) (PERIDEX) 0.12 % solution 15 mL, 15 mL, Mouth Rinse, BID, Samaan, Maged, MD, 15 mL at 08/07/18 0717 .  dextrose 5 % in lactated ringers infusion, , Intravenous, Continuous, Wilhelmina Mcardle, MD, Last Rate: 50 mL/hr at 08/07/18 1052 .  fentaNYL (SUBLIMAZE) injection 50 mcg, 50 mcg, Intravenous, Q15 min PRN, Wilhelmina Mcardle, MD, 50 mcg at 08/07/18 1532 .  fentaNYL (SUBLIMAZE) injection 50 mcg, 50 mcg, Intravenous, Q2H PRN, Merton Border B, MD .  hydrALAZINE (APRESOLINE) injection 10-40 mg, 10-40 mg, Intravenous, Q4H PRN, Darel Hong D, NP, 20 mg at 08/07/18 1510 .  insulin aspart (novoLOG) injection 0-15 Units, 0-15 Units, Subcutaneous, Q4H, Simonds, David B, MD .  ipratropium-albuterol (DUONEB) 0.5-2.5 (3) MG/3ML nebulizer solution 3 mL, 3 mL, Nebulization, Q6H, Sainani, Belia Heman, MD, 3 mL at 08/07/18 1524 .  MEDLINE mouth rinse, 15 mL, Mouth Rinse, 10 times per day, Wilhelmina Mcardle, MD, 15 mL at 08/07/18 1549 .  metoCLOPramide (REGLAN) injection 5 mg, 5 mg, Intravenous, Q6H PRN, Darel Hong D, NP, 5 mg at 08/07/18 1355 .  metoprolol tartrate (LOPRESSOR) injection 2.5-5 mg, 2.5-5 mg, Intravenous, Q3H PRN, Darel Hong D, NP, 5 mg at 08/07/18 1532 .  midazolam (VERSED) injection 1 mg, 1 mg, Intravenous, Q15 min PRN, Wilhelmina Mcardle, MD, 1 mg at 08/07/18 1532 .  midazolam (VERSED) injection 1 mg, 1 mg, Intravenous, Q2H PRN, Wilhelmina Mcardle, MD .  [DISCONTINUED] ondansetron (ZOFRAN) tablet 4 mg, 4 mg, Oral, Q6H PRN **OR** ondansetron (ZOFRAN) injection 4 mg, 4 mg, Intravenous, Q6H PRN, [name], MD, 4 mg at 08/07/18 1002 .  [START ON 08/09/2018] pantoprazole (PROTONIX) injection 40 mg, 40 mg, Intravenous, Q12H, Samaan, Maged, MD .  piperacillin-tazobactam (ZOSYN) IVPB 3.375 g, 3.375 g, Intravenous, Q8H, Sainani, Vivek J, MD, Last Rate: 12.5 mL/hr at 08/07/18  1344, 3.375 g at 08/07/18 1344  Family History  Problem Relation Age of Onset  . Diabetes Neg Hx   . Hypertension Neg Hx      Social History   Tobacco Use  . Smoking status: Current Every Day Smoker    Packs/day: 1.00    Years: 40.00    Pack years: 40.00    Types: Cigarettes  . Smokeless tobacco: Never Used  Substance Use Topics  . Alcohol use: No  . Drug use: No    Allergies as of 08/05/2018 - Review Complete 08/05/2018  Allergen Reaction Noted  . Accupril [quinapril hcl]  08/01/2015    Review of Systems:    All systems reviewed and negative except where noted in HPI.   Physical Exam:  Vital signs in last 24 hours: Temp:  [96.3 F (35.7 C)-100.9 F (38.3 C)] 99.3 F (37.4 C) (08/12 1530) Pulse Rate:  [66-125] 123 (08/12 1530) Resp:  [6-41] 17 (08/12 1530) BP: (54-230)/(40-154) 202/77 (08/12 1530) SpO2:  [  79 %-100 %] 97 % (08/12 1530) FiO2 (%):  [30 %] 30 % (08/12 1130) Weight:  [75.5 kg] 75.5 kg (08/12 0411) Last BM Date: (unknown) General: intubated, sedated, not in distress Head:  Normocephalic and atraumatic. Eyes:   No icterus.   Conjunctiva pink. PERRLA. Ears:  Normal auditory acuity. Neck:  Supple; no masses or thyroidomegaly Lungs: Respirations even and unlabored. Lungs clear to auscultation bilaterally.   No wheezes, crackles, or rhonchi.  Heart:  Regular rate and rhythm;  Without murmur, clicks, rubs or gallops Abdomen:  Soft, nondistended, nontender. Normal bowel sounds. No appreciable masses or hepatomegaly.  No rebound or guarding.  Rectal:  Not performed. Msk:  Symmetrical without gross deformities.  Extremities:  Without edema, cyanosis or clubbing.   LAB RESULTS: CBC Latest Ref Rng & Units 08/07/2018 08/07/2018 08/06/2018  WBC 3.6 - 11.0 K/uL - 12.9(H) -  Hemoglobin 12.0 - 16.0 g/dL 12.1 11.7(L) 11.8(L)  Hematocrit 35.0 - 47.0 % 35.4 34.1(L) 35.1  Platelets 150 - 440 K/uL - 339 -    BMET BMP Latest Ref Rng & Units 08/07/2018 08/06/2018  08/06/2018  Glucose 70 - 99 mg/dL 130(H) 350(H) 274(H)  BUN 8 - 23 mg/dL 24(H) 24(H) 19  Creatinine 0.44 - 1.00 mg/dL 1.33(H) 1.55(H) 1.64(H)  Sodium 135 - 145 mmol/L 141 137 138  Potassium 3.5 - 5.1 mmol/L 4.0 4.5 5.4(H)  Chloride 98 - 111 mmol/L 112(H) 109 107  CO2 22 - 32 mmol/L 23 20(L) 21(L)  Calcium 8.9 - 10.3 mg/dL 8.1(L) 7.9(L) 7.9(L)    LFT Hepatic Function Latest Ref Rng & Units 08/05/2018  Total Protein 6.5 - 8.1 g/dL 7.5  Albumin 3.5 - 5.0 g/dL 3.5  AST 15 - 41 U/L 43(H)  ALT 0 - 44 U/L 22  Alk Phosphatase 38 - 126 U/L 64  Total Bilirubin 0.3 - 1.2 mg/dL 0.7     STUDIES: Dg Abd 1 View  Result Date: 08/07/2018 CLINICAL DATA:  NG tube placement. EXAM: ABDOMEN - 1 VIEW COMPARISON:  Radiograph dated 08/05/2018 FINDINGS: The tip of the NG tube is in the distal esophagus just proximal to the gastroesophageal junction. The visualized bowel gas pattern is normal. Diffuse accentuation of the interstitial markings of both lungs with Kerley B-lines suggesting interstitial edema are noted. IMPRESSION: NG tube tip at the gastroesophageal junction. Interstitial pulmonary edema, unchanged. Electronically Signed   By: Lorriane Shire M.D.   On: 08/07/2018 14:59   Dg Abd 1 View  Result Date: 08/05/2018 CLINICAL DATA:  Evaluate NG tube EXAM: ABDOMEN - 1 VIEW COMPARISON:  August 05, 2018 FINDINGS: The distal tip of the NG tube is in the gastric fundus. The side port is near the GE junction. IMPRESSION: The side port of the NG tube is near the GE junction. Consider advancement. Electronically Signed   By: Dorise Bullion III M.D   On: 08/05/2018 20:41   US Renal  Result Date: 08/06/2018 CLINICAL DATA:  Acute kidney injury EXAM: RENAL / URINARY TRACT ULTRASOUND COMPLETE COMPARISON:  None. FINDINGS: Right Kidney: Length: 11.7 cm. Echogenicity within normal limits. No mass or hydronephrosis visualized. Left Kidney: Length: 11.8 cm. Trace amount of perinephric fluid. Echogenicity within normal  limits. No mass or hydronephrosis visualized. Bladder: Decompressed bladder with a Foley catheter present. Other: No focal lesion identified. Increased hepatic parenchymal echogenicity. IMPRESSION: 1. No obstructive uropathy.  Trace amount of left perinephric fluid. Electronically Signed   By: Kathreen Devoid   On: 08/06/2018 15:40   Portable Chest  X-ray  Result Date: 08/07/2018 CLINICAL DATA:  Endotracheal tube placement. NG tube placement. Pulmonary edema. EXAM: PORTABLE CHEST 1 VIEW 2:28 p.m. COMPARISON:  Chest x-ray dated 08/07/2018 at 1:36 p.m. and chest x-rays dated 08/06/2018 and 08/05/2018 FINDINGS: Endotracheal tube tip is 5 cm above the carina. NG tube tip is at the gastroesophageal junction and needs to be advanced. NG tube is looped in the cervical esophagus. Bilateral interstitial pulmonary edema has improved. Small right pleural effusion. Heart size is normal. Slight pulmonary vascular prominence. Aortic atherosclerosis. No acute bone abnormality. IMPRESSION: 1. Endotracheal tube in good position. 2. NG tube tip is at the gastroesophageal junction. The NG tube is looped in the cervical esophagus. 3. Improved bilateral interstitial pulmonary edema. Small residual right effusion. No appreciable left effusion. 4.  Aortic Atherosclerosis (ICD10-I70.0). Electronically Signed   By: Lorriane Shire M.D.   On: 08/07/2018 15:01   Dg Chest Port 1 View  Result Date: 08/07/2018 CLINICAL DATA:  77 year old female with respiratory failure, pulmonary edema and/or pneumonia on recent chest CTA. EXAM: PORTABLE CHEST 1 VIEW COMPARISON:  0527 hours today and earlier. FINDINGS: Portable AP semi upright view at 1336 hours. Cardiac and mediastinal contours remain normal. Calcified aortic atherosclerosis. Visualized tracheal air column is within normal limits. The patient has been extubated and the enteric tube removed. Acutely increased diffuse pulmonary interstitial opacity, although lung volumes are stable to mildly  larger. Small bilateral pleural effusions suspected. No pneumothorax. The confluent lower lung airspace disease may persist in the form of retrocardiac opacity. Paucity of upper abdominal bowel gas. No acute osseous abnormality identified. IMPRESSION: 1. Extubated and enteric tube removed. 2. New bilateral pulmonary interstitial opacity favored to reflect acute pulmonary edema. 3. Confluent bilateral lower lobe airspace disease seen on 08/10 19 May persist. Suspected small bilateral pleural effusions Electronically Signed   By: Genevie Ann M.D.   On: 08/07/2018 14:03   Dg Chest Port 1 View  Result Date: 08/07/2018 CLINICAL DATA:  Pneumonia EXAM: PORTABLE CHEST 1 VIEW COMPARISON:  08/06/2018 and prior radiographs FINDINGS: An endotracheal tube with tip 4.2 cm above the carina and NG tube entering the stomach again noted. Mild bilateral interstitial opacities and LOWER lung consolidation/atelectasis again noted. There is no evidence of pneumothorax or large pleural effusion. IMPRESSION: Slight advancement of endotracheal tube, otherwise unchanged appearance of the chest. Electronically Signed   By: Margarette [place] M.D.   On: 08/07/2018 07:16   Dg Chest Port 1 View  Result Date: 08/06/2018 CLINICAL DATA:  Pneumonia, abnormal blood gas EXAM: PORTABLE CHEST 1 VIEW COMPARISON:  08/05/2018 FINDINGS: Endotracheal tube terminates 6.5 cm above the carina. Enteric tube courses into the proximal gastric body. Mild left basilar atelectasis. Possible small left pleural effusion. Right lung is essentially clear. No pneumothorax. The heart is normal in size. Thoracic aortic atherosclerosis. IMPRESSION: Endotracheal tube terminates 6.5 cm above the carina. Mild left basilar atelectasis with possible small left pleural effusion. Electronically Signed   By: Julian Hy M.D.   On: 08/06/2018 08:08   Dg Chest Port 1 View  Result Date: 08/05/2018 CLINICAL DATA:  ET and OG tube placement EXAM: PORTABLE CHEST 1 VIEW COMPARISON:   August 05, 2018 FINDINGS: Bilateral primarily interstitial pulmonary infiltrates remain. This was thought to represent pneumonia on the CT scan from earlier today. No pneumothorax. The ETT terminates in the mid trachea. The NG tube terminates with the side port near the GE junction in the distal tip in the stomach. The heart size is borderline.  The hila and mediastinum are unremarkable. No pulmonary nodules or masses. IMPRESSION: 1. The ETT is in good position. 2. The side port of the NG tube is near the GE junction. Consider advancement. 3. Interstitial pulmonary infiltrates remain. This was thought to represent pneumonia on today's CT scan. Electronically Signed   By: Dorise Bullion III M.D   On: 08/05/2018 20:40   Dg Abd Portable 1v  Result Date: 08/05/2018 CLINICAL DATA:  NG tube placement. EXAM: PORTABLE ABDOMEN - 1 VIEW COMPARISON:  Current abdomen pelvis CT. FINDINGS: Nasogastric tube extends below the diaphragm with its tip in the proximal stomach. Side hole of the tube lies in the distal esophagus. IMPRESSION: NG tube tip in the proximal stomach. Electronically Signed   By: Lajean Manes M.D.   On: 08/05/2018 19:26   [place] Ekg Site Rite  Result Date: 08/07/2018 If Site Rite image not attached, placement could not be confirmed due to current cardiac rhythm.     Impression / Plan:   Brittney [name] is a 77 y.o. female with chronic tobacco use, admitted with hypercapnic respiratory failure on mechanical ventilation, GI is consulted for possible upper GI bleed. Patient's hemoglobin has been stable since admission and there is no witnessed episodes of upper GI bleed including coffee-ground emesis or hematemesis or melena or hematochezia. She may have gastritis or stress ulcers or erosive esophagitis  - Okay to restart tube feeds from GI standpoint - Continue Protonix 40 mg IV twice a day - Monitor CBC daily - Performing upper endoscopy will be of low yield, as clinical suspicion for GI bleed is  very low  Thank you for involving me in the care of this patient.  Will follow along with you    LOS: 2 days   Sherri Sear, MD  08/07/2018, 4:03 PM   Note: This dictation was prepared with Dragon dictation along with smaller phrase technology. Any transcriptional errors that result from this process are unintentional.

## 2018-08-07 NOTE — Progress Notes (Signed)
Pt was suctioned prior to extubation for a small amount of thin clear secretions. Per Dr. Alva Garnet verbal order, she was extubated without incident. She wasplaced on a 2 L nasal cannula and Sa02 was 94%. No stridor was heard.

## 2018-08-07 NOTE — Progress Notes (Addendum)
Pt with increased work of breathing (RR in 30's), assessory muscle use, and persistent hypoxia despite 100% HFNC.  Acute hypoxic respiratory failure likely in setting of acute aspiration following vomiting.  Discussed with Dr. Alva Garnet at bedside.  Pt was intubated for respiratory distress and hypoxia.  Discussed with pt's daughter at bedside.  Pt already on Zosyn, will continue for coverage of aspiration.   Darel Hong, AGACNP-BC [place] [facility] Pager: [phone]  Merton Border, MD PCCM service Mobile [phone] Pager [phone] 08/07/2018 5:54 PM

## 2018-08-07 NOTE — Progress Notes (Signed)
[facility] [place] NAME: Brittney [name]    MR#:  [id number]  DATE OF BIRTH:  [birth year]  SUBJECTIVE:  CHIEF COMPLAINT:   Chief Complaint  Patient presents with  . Shortness of Breath  Patient seen and evaluated today Extubated this morning Had 2 episodes of vomiting Oxygen saturations dropped when patient on nasal cannula   REVIEW OF SYSTEMS:    ROS Review of Systems  Constitutional: Negative.   HENT: Negative.   Eyes: Negative.   Respiratory: Has cough and shortness of breath Cardiovascular: Negative.   Gastrointestinal: Had vomiting Genitourinary: Negative.   Musculoskeletal: Negative.   Skin: Negative.   Neurological: Negative.   Endo/Heme/Allergies: Negative.   Psychiatric/Behavioral: Negative.   All other systems reviewed and are negative.  DRUG ALLERGIES:   Allergies  Allergen Reactions  . Accupril [Quinapril Hcl]     VITALS:  Blood pressure (!) 204/101, pulse (!) 120, temperature 98.2 F (36.8 C), resp. rate (!) 32, height 5\' 7"  (1.702 m), weight 75.5 kg, SpO2 100 %.  PHYSICAL EXAMINATION:   Physical Exam  GENERAL:  78 y.o.-year-old patient lying in the bed on oxygen via nasal cannula EYES: Pupils equal, round, reactive to light and accommodation. No scleral icterus. Extraocular muscles intact.  HEENT: Head atraumatic, normocephalic. Oropharynx dry  NECK:  Supple, no jugular venous distention. No thyroid enlargement, no tenderness.  LUNGS: Decreased breath sounds bilaterally, bibasilar rales heard. use of accessory muscles of respiration.  CARDIOVASCULAR: S1, S2 normal. No murmurs, rubs, or gallops.  ABDOMEN: Soft, nontender, nondistended. Bowel sounds present. No organomegaly or mass.  EXTREMITIES: No cyanosis, clubbing or edema b/l.    NEUROLOGIC: Awake and alert and responds to all verbal commands Moves all extremities PSYCHIATRIC: Oriented to time place and person SKIN: No obvious rash, lesion, or  ulcer.   LABORATORY PANEL:   CBC Recent Labs  Lab 08/07/18 0418 08/07/18 1120  WBC 12.9*  --   HGB 11.7* 12.1  HCT 34.1* 35.4  PLT 339  --    ------------------------------------------------------------------------------------------------------------------ Chemistries  Recent Labs  Lab 08/05/18 1355  08/07/18 0418  NA 137   < > 141  K 3.7   < > 4.0  CL 104   < > 112*  CO2 19*   < > 23  GLUCOSE 405*   < > 130*  BUN 14   < > 24*  CREATININE 1.16*   < > 1.33*  CALCIUM 9.0   < > 8.1*  MG  --    < > 2.1  AST 43*  --   --   ALT 22  --   --   ALKPHOS 64  --   --   BILITOT 0.7  --   --    < > = values in this interval not displayed.   ------------------------------------------------------------------------------------------------------------------  Cardiac Enzymes Recent Labs  Lab 08/06/18 0058  TROPONINI 0.88*   ------------------------------------------------------------------------------------------------------------------  RADIOLOGY:  Dg Abd 1 View  Result Date: 08/05/2018 CLINICAL DATA:  Evaluate NG tube EXAM: ABDOMEN - 1 VIEW COMPARISON:  August 05, 2018 FINDINGS: The distal tip of the NG tube is in the gastric fundus. The side port is near the GE junction. IMPRESSION: The side port of the NG tube is near the GE junction. Consider advancement. Electronically Signed   By: Dorise Bullion III M.D   On: 08/05/2018 20:41   Ct Angio Chest Pe W And/or Wo Contrast  Result Date: 08/05/2018 CLINICAL DATA:  Respiratory  distress, diaphoresis and lethargy. Intubated. Acute, generalized abdominal pain. EXAM: CT ANGIOGRAPHY CHEST CT ABDOMEN AND PELVIS WITH CONTRAST TECHNIQUE: Multidetector CT imaging of the chest was performed using the standard protocol during bolus administration of intravenous contrast. Multiplanar CT image reconstructions and MIPs were obtained to evaluate the vascular anatomy. Multidetector CT imaging of the abdomen and pelvis was performed using the standard  protocol during bolus administration of intravenous contrast. CONTRAST:  87mL ISOVUE-370 IOPAMIDOL (ISOVUE-370) INJECTION 76% COMPARISON:  None. FINDINGS: CTA CHEST FINDINGS Cardiovascular: Normally opacified pulmonary arteries with no pulmonary arterial filling defects seen. Atheromatous calcifications, including the coronary arteries and aorta. Borderline enlarged heart. No pericardial effusion. Mediastinum/Nodes: Mildly prominent precarinal node with a short axis diameter of 10 mm on image number 40 series 4. Mildly enlarged right hilar nodes, the largest with a short axis diameter of 11 mm on image number 54 series 4. Enlarged left hilar nodes, the largest with a short axis diameter of 10 mm on image number 52 series 4. Endotracheal 2 tip 2.7 cm above the carina. Nasogastric tube extending into the stomach. Lungs/Pleura: Dense, patchy and confluent airspace opacity in both lower lobes. Diffusely prominent interstitial markings with septal edema. Small bilateral pleural effusions. Musculoskeletal: Thoracic and lower cervical spine degenerative changes. Review of the MIP images confirms the above findings. CT ABDOMEN and PELVIS FINDINGS Hepatobiliary: Multiple gallstones in the gallbladder measuring up to 2.0 cm in maximum diameter each. No gallbladder wall thickening or pericholecystic fluid. Normal appearing liver. Pancreas: Unremarkable. No pancreatic ductal dilatation or surrounding inflammatory changes. Spleen: Normal in size without focal abnormality. Adrenals/Urinary Tract: Small bilateral renal cysts. Normal appearing adrenal glands and ureters. Foley catheter in the urinary bladder with associated air in the bladder. Stomach/Bowel: Single distal descending colon diverticulum. Nasogastric tube tip in the mid to proximal stomach. Unremarkable small bowel. No evidence of appendicitis. Vascular/Lymphatic: Atheromatous arterial calcifications without aneurysm. No enlarged lymph nodes. Reproductive: Normal  sized uterus with multiple small peripheral calcifications. No adnexal mass. Other: No abdominal wall hernia or abnormality. No abdominopelvic ascites. Musculoskeletal: Lumbar and lower thoracic spine degenerative changes. Review of the MIP images confirms the above findings. IMPRESSION: 1. No pulmonary emboli. 2. Dense, patchy and confluent airspace opacity in both lower lobes. This is most likely due to pneumonia. Patchy atelectasis is less likely. 3. Borderline cardiomegaly with changes of acute congestive heart failure. 4. Mild mediastinal and bilateral hilar adenopathy, most likely reactive. 5. Cholelithiasis without evidence cholecystitis. 6.  Calcific coronary artery and aortic atherosclerosis. Aortic Atherosclerosis (ICD10-I70.0). Electronically Signed   By: Claudie Revering M.D.   On: 08/05/2018 15:40   Ct Abdomen Pelvis W Contrast  Result Date: 08/05/2018 CLINICAL DATA:  Respiratory distress, diaphoresis and lethargy. Intubated. Acute, generalized abdominal pain. EXAM: CT ANGIOGRAPHY CHEST CT ABDOMEN AND PELVIS WITH CONTRAST TECHNIQUE: Multidetector CT imaging of the chest was performed using the standard protocol during bolus administration of intravenous contrast. Multiplanar CT image reconstructions and MIPs were obtained to evaluate the vascular anatomy. Multidetector CT imaging of the abdomen and pelvis was performed using the standard protocol during bolus administration of intravenous contrast. CONTRAST:  24mL ISOVUE-370 IOPAMIDOL (ISOVUE-370) INJECTION 76% COMPARISON:  None. FINDINGS: CTA CHEST FINDINGS Cardiovascular: Normally opacified pulmonary arteries with no pulmonary arterial filling defects seen. Atheromatous calcifications, including the coronary arteries and aorta. Borderline enlarged heart. No pericardial effusion. Mediastinum/Nodes: Mildly prominent precarinal node with a short axis diameter of 10 mm on image number 40 series 4. Mildly enlarged right hilar nodes, the largest  with a  short axis diameter of 11 mm on image number 54 series 4. Enlarged left hilar nodes, the largest with a short axis diameter of 10 mm on image number 52 series 4. Endotracheal 2 tip 2.7 cm above the carina. Nasogastric tube extending into the stomach. Lungs/Pleura: Dense, patchy and confluent airspace opacity in both lower lobes. Diffusely prominent interstitial markings with septal edema. Small bilateral pleural effusions. Musculoskeletal: Thoracic and lower cervical spine degenerative changes. Review of the MIP images confirms the above findings. CT ABDOMEN and PELVIS FINDINGS Hepatobiliary: Multiple gallstones in the gallbladder measuring up to 2.0 cm in maximum diameter each. No gallbladder wall thickening or pericholecystic fluid. Normal appearing liver. Pancreas: Unremarkable. No pancreatic ductal dilatation or surrounding inflammatory changes. Spleen: Normal in size without focal abnormality. Adrenals/Urinary Tract: Small bilateral renal cysts. Normal appearing adrenal glands and ureters. Foley catheter in the urinary bladder with associated air in the bladder. Stomach/Bowel: Single distal descending colon diverticulum. Nasogastric tube tip in the mid to proximal stomach. Unremarkable small bowel. No evidence of appendicitis. Vascular/Lymphatic: Atheromatous arterial calcifications without aneurysm. No enlarged lymph nodes. Reproductive: Normal sized uterus with multiple small peripheral calcifications. No adnexal mass. Other: No abdominal wall hernia or abnormality. No abdominopelvic ascites. Musculoskeletal: Lumbar and lower thoracic spine degenerative changes. Review of the MIP images confirms the above findings. IMPRESSION: 1. No pulmonary emboli. 2. Dense, patchy and confluent airspace opacity in both lower lobes. This is most likely due to pneumonia. Patchy atelectasis is less likely. 3. Borderline cardiomegaly with changes of acute congestive heart failure. 4. Mild mediastinal and bilateral hilar  adenopathy, most likely reactive. 5. Cholelithiasis without evidence cholecystitis. 6.  Calcific coronary artery and aortic atherosclerosis. Aortic Atherosclerosis (ICD10-I70.0). Electronically Signed   By: Claudie Revering M.D.   On: 08/05/2018 15:40   US Renal  Result Date: 08/06/2018 CLINICAL DATA:  Acute kidney injury EXAM: RENAL / URINARY TRACT ULTRASOUND COMPLETE COMPARISON:  None. FINDINGS: Right Kidney: Length: 11.7 cm. Echogenicity within normal limits. No mass or hydronephrosis visualized. Left Kidney: Length: 11.8 cm. Trace amount of perinephric fluid. Echogenicity within normal limits. No mass or hydronephrosis visualized. Bladder: Decompressed bladder with a Foley catheter present. Other: No focal lesion identified. Increased hepatic parenchymal echogenicity. IMPRESSION: 1. No obstructive uropathy.  Trace amount of left perinephric fluid. Electronically Signed   By: Kathreen Devoid   On: 08/06/2018 15:40   Dg Chest Port 1 View  Result Date: 08/07/2018 CLINICAL DATA:  77 year old female with respiratory failure, pulmonary edema and/or pneumonia on recent chest CTA. EXAM: PORTABLE CHEST 1 VIEW COMPARISON:  0527 hours today and earlier. FINDINGS: Portable AP semi upright view at 1336 hours. Cardiac and mediastinal contours remain normal. Calcified aortic atherosclerosis. Visualized tracheal air column is within normal limits. The patient has been extubated and the enteric tube removed. Acutely increased diffuse pulmonary interstitial opacity, although lung volumes are stable to mildly larger. Small bilateral pleural effusions suspected. No pneumothorax. The confluent lower lung airspace disease may persist in the form of retrocardiac opacity. Paucity of upper abdominal bowel gas. No acute osseous abnormality identified. IMPRESSION: 1. Extubated and enteric tube removed. 2. New bilateral pulmonary interstitial opacity favored to reflect acute pulmonary edema. 3. Confluent bilateral lower lobe airspace  disease seen on 08/10 19 May persist. Suspected small bilateral pleural effusions Electronically Signed   By: Genevie Ann M.D.   On: 08/07/2018 14:03   Dg Chest Port 1 View  Result Date: 08/07/2018 CLINICAL DATA:  Pneumonia EXAM: PORTABLE CHEST  1 VIEW COMPARISON:  08/06/2018 and prior radiographs FINDINGS: An endotracheal tube with tip 4.2 cm above the carina and NG tube entering the stomach again noted. Mild bilateral interstitial opacities and LOWER lung consolidation/atelectasis again noted. There is no evidence of pneumothorax or large pleural effusion. IMPRESSION: Slight advancement of endotracheal tube, otherwise unchanged appearance of the chest. Electronically Signed   By: Margarette Canada M.D.   On: 08/07/2018 07:16   Dg Chest Port 1 View  Result Date: 08/06/2018 CLINICAL DATA:  Pneumonia, abnormal blood gas EXAM: PORTABLE CHEST 1 VIEW COMPARISON:  08/05/2018 FINDINGS: Endotracheal tube terminates 6.5 cm above the carina. Enteric tube courses into the proximal gastric body. Mild left basilar atelectasis. Possible small left pleural effusion. Right lung is essentially clear. No pneumothorax. The heart is normal in size. Thoracic aortic atherosclerosis. IMPRESSION: Endotracheal tube terminates 6.5 cm above the carina. Mild left basilar atelectasis with possible small left pleural effusion. Electronically Signed   By: Julian Hy M.D.   On: 08/06/2018 08:08   Dg Chest Port 1 View  Result Date: 08/05/2018 CLINICAL DATA:  ET and OG tube placement EXAM: PORTABLE CHEST 1 VIEW COMPARISON:  August 05, 2018 FINDINGS: Bilateral primarily interstitial pulmonary infiltrates remain. This was thought to represent pneumonia on the CT scan from earlier today. No pneumothorax. The ETT terminates in the mid trachea. The NG tube terminates with the side port near the GE junction in the distal tip in the stomach. The heart size is borderline. The hila and mediastinum are unremarkable. No pulmonary nodules or masses.  IMPRESSION: 1. The ETT is in good position. 2. The side port of the NG tube is near the GE junction. Consider advancement. 3. Interstitial pulmonary infiltrates remain. This was thought to represent pneumonia on today's CT scan. Electronically Signed   By: Dorise Bullion III M.D   On: 08/05/2018 20:40   Dg Abd Portable 1v  Result Date: 08/05/2018 CLINICAL DATA:  NG tube placement. EXAM: PORTABLE ABDOMEN - 1 VIEW COMPARISON:  Current abdomen pelvis CT. FINDINGS: Nasogastric tube extends below the diaphragm with its tip in the proximal stomach. Side hole of the tube lies in the distal esophagus. IMPRESSION: NG tube tip in the proximal stomach. Electronically Signed   By: Lajean Manes M.D.   On: 08/05/2018 19:26     ASSESSMENT AND PLAN:  77 year old female patient currently in the ICU for respiratory failure on ventilator  -Acute hypoxic respiratory failure with hypercapnia Off mechanical ventilator Oxygen saturation dropped Oxygen increased to 4 L via nasal cannula and then put on nonrebreather mask because of hypoxia and low oxygen saturation  -Acute COPD exacerbation Nebulization treatments and IV steroids along with empiric antibiotics  -Sepsis secondary to pneumonia Continue IV Zosyn antibiotic Follow procalcitonin, lactic acid and cultures  -Pneumonia CTA chest no pulmonary embolism Mediastinal and hilar adenopathy probably reactive in etiology Continue broad-spectrum antibiotics and follow-up cultures  -Cholelithiasis No evidence of any inflammation  -Type 2 diabetes mellitus Glycemic control strictly  -Reactive thrombocytosis improving  All the records are reviewed and case discussed with Care Management/Social Worker. Management plans discussed with the patient, family and they are in agreement.  CODE STATUS: Full code  DVT Prophylaxis: SCDs  TOTAL TIME TAKING CARE OF THIS PATIENT: 35 minutes.   POSSIBLE D/C IN 3 to 4 DAYS, DEPENDING ON CLINICAL CONDITION.  Saundra Shelling M.D on 08/07/2018 at 2:20 PM  Between 7am to 6pm - Pager - [phone]  After 6pm go to [email or web address] -  password [email or web address]  Office  [phone]  CC: Primary care physician; System, Pcp Not In  Note: This dictation was prepared with Dragon dictation along with smaller phrase technology. Any transcriptional errors that result from this process are unintentional.

## 2018-08-07 NOTE — Procedures (Addendum)
Intubation Procedure Note MONIC ENGELMANN [id number] [birth year]  Procedure: Intubation Indications: Respiratory insufficiency  Procedure Details Consent: Risks of procedure as well as the alternatives and risks of each were explained to the (patient/caregiver).  Consent for procedure obtained. Time Out: Verified patient identification, verified procedure, site/side was marked, verified correct patient position, special equipment/implants available, medications/allergies/relevent history reviewed, required imaging and test results available.  Performed  Maximum sterile technique was used including gloves, hand hygiene and mask.  Glidoscope was used to directly visualize the vocal cords, and advancement of ETT through the vocal cords.     Evaluation Hemodynamic Status: BP stable throughout; O2 sats: stable throughout and currently acceptable Patient's Current Condition: stable Complications: No apparent complications Patient did tolerate procedure well. Chest X-ray ordered to verify placement.  CXR: pending.  Procedure was performed under the direct supervision of Dr. Alva Garnet at bedside.  Darel Hong, AGACNP-BC [facility] Pager: [phone] 08/07/2018    Pulmonary attending: I was present for and supervised the entire procedure  Merton Border, MD ; [facility] Mobile [phone].  After 5:30 PM or weekends, call [phone]

## 2018-08-07 NOTE — Progress Notes (Signed)
This RN was at bedside administering ordered labetalol for tachycardia and HTN. Pt stating that she can't get her breath, with junky cough and coarse breath sounds. Oxygen saturations dropped from high 90s on 2L [place] to low 90s, increased O2 to 4L without effect, saturations then dropped to 70s. Darlyn Chamber, NP and respiratory called to bedside, pt placed on NRB, sats up to 90s, but then dropped to 80s again. Awaiting CXR. Pt has vomited twice this shift, most recently approx. 30 minutes prior to pt desaturating.

## 2018-08-07 NOTE — Progress Notes (Signed)
Spoke with NP about patient's insulin gtt at 1.1 and cbg down to 100. NP to put in transition orders. Awakes easily and is now following more commands. Pain free at this time. Continue to monitor.

## 2018-08-08 ENCOUNTER — Inpatient Hospital Stay: Payer: Medicare HMO

## 2018-08-08 LAB — CALCIUM, IONIZED
Calcium, Ionized, Serum: 4.1 mg/dL — ABNORMAL LOW (ref 4.5–5.6)
Calcium, Ionized, Serum: 4.4 mg/dL — ABNORMAL LOW (ref 4.5–5.6)
Calcium, Ionized, Serum: 4.8 mg/dL (ref 4.5–5.6)

## 2018-08-08 LAB — TROPONIN I
Troponin I: 1.35 ng/mL (ref <0.03)
Troponin I: 1.75 ng/mL (ref <0.03)
Troponin I: 1.58 ng/mL (ref <0.03)
Troponin I: 1.01 ng/mL (ref <0.03)

## 2018-08-08 LAB — BASIC METABOLIC PANEL
Anion gap: 4 — ABNORMAL LOW (ref 5–15)
BUN: 28 mg/dL — ABNORMAL HIGH (ref 8–23)
CO2: 25 mmol/L (ref 22–32)
Calcium: 8.3 mg/dL — ABNORMAL LOW (ref 8.9–10.3)
Chloride: 112 mmol/L — ABNORMAL HIGH (ref 98–111)
Creatinine, Ser: 1.17 mg/dL — ABNORMAL HIGH (ref 0.44–1.00)
GFR calc Af Amer: 51 mL/min — ABNORMAL LOW (ref >60)
GFR calc non Af Amer: 44 mL/min — ABNORMAL LOW (ref >60)
Glucose, Bld: 198 mg/dL — ABNORMAL HIGH (ref 70–99)
Potassium: 3.7 mmol/L (ref 3.5–5.1)
Sodium: 141 mmol/L (ref 135–145)

## 2018-08-08 LAB — GLUCOSE, CAPILLARY
Glucose-Capillary: 168 mg/dL — ABNORMAL HIGH (ref 70–99)
Glucose-Capillary: 183 mg/dL — ABNORMAL HIGH (ref 70–99)
Glucose-Capillary: 191 mg/dL — ABNORMAL HIGH (ref 70–99)
Glucose-Capillary: 193 mg/dL — ABNORMAL HIGH (ref 70–99)
Glucose-Capillary: 203 mg/dL — ABNORMAL HIGH (ref 70–99)
Glucose-Capillary: 206 mg/dL — ABNORMAL HIGH (ref 70–99)
Glucose-Capillary: 217 mg/dL — ABNORMAL HIGH (ref 70–99)

## 2018-08-08 LAB — CBC
HCT: 36.4 % (ref 35.0–47.0)
Hemoglobin: 12.3 g/dL (ref 12.0–16.0)
MCH: 27.7 pg (ref 26.0–34.0)
MCHC: 33.8 g/dL (ref 32.0–36.0)
MCV: 81.9 fL (ref 80.0–100.0)
Platelets: 375 10*3/uL (ref 150–440)
RBC: 4.44 MIL/uL (ref 3.80–5.20)
RDW: 15.2 % — ABNORMAL HIGH (ref 11.5–14.5)
WBC: 19.9 10*3/uL — ABNORMAL HIGH (ref 3.6–11.0)

## 2018-08-08 LAB — BRAIN NATRIURETIC PEPTIDE: B Natriuretic Peptide: 1069 pg/mL — ABNORMAL HIGH (ref 0.0–100.0)

## 2018-08-08 LAB — PROCALCITONIN: Procalcitonin: 1.13 ng/mL

## 2018-08-08 LAB — HEMOGLOBIN A1C
Hgb A1c MFr Bld: 7.3 % — ABNORMAL HIGH (ref 4.8–5.6)
Mean Plasma Glucose: 163 mg/dL

## 2018-08-08 MED ORDER — BLISTEX MEDICATED EX OINT
TOPICAL_OINTMENT | CUTANEOUS | Status: DC | PRN
  Filled 2018-08-08: qty 6.3

## 2018-08-08 MED ORDER — PANTOPRAZOLE SODIUM 40 MG IV SOLR
40.0000 mg | Freq: Every day | INTRAVENOUS | Status: DC
  Administered 2018-08-08 – 2018-08-09 (×2): 40 mg via INTRAVENOUS
  Filled 2018-08-08 (×2): qty 40

## 2018-08-08 MED ORDER — ENOXAPARIN SODIUM 40 MG/0.4ML ~~LOC~~ SOLN
40.0000 mg | SUBCUTANEOUS | Status: DC
  Administered 2018-08-08 – 2018-08-10 (×3): 40 mg via SUBCUTANEOUS
  Filled 2018-08-08 (×3): qty 0.4

## 2018-08-08 MED ORDER — ACETAMINOPHEN 650 MG RE SUPP: 650.0000 mg | Freq: Four times a day (QID) | RECTAL | Status: DC | PRN

## 2018-08-08 MED ORDER — FUROSEMIDE 10 MG/ML IJ SOLN
40.0000 mg | Freq: Once | INTRAMUSCULAR | Status: AC
  Administered 2018-08-08: 40 mg via INTRAVENOUS
  Filled 2018-08-08: qty 4

## 2018-08-08 MED ORDER — METOPROLOL TARTRATE 25 MG PO TABS
25.0000 mg | ORAL_TABLET | Freq: Two times a day (BID) | ORAL | Status: DC
  Administered 2018-08-08 – 2018-08-09 (×2): 25 mg
  Filled 2018-08-08 (×3): qty 1

## 2018-08-08 MED ORDER — POTASSIUM CHLORIDE 20 MEQ/15ML (10%) PO SOLN
40.0000 meq | Freq: Once | ORAL | Status: AC
  Administered 2018-08-08: 40 meq
  Filled 2018-08-08: qty 30

## 2018-08-08 MED ORDER — ACETAMINOPHEN 325 MG PO TABS: 650.0000 mg | ORAL_TABLET | Freq: Four times a day (QID) | ORAL | Status: DC | PRN

## 2018-08-08 MED ORDER — ASPIRIN 325 MG PO TABS
325.0000 mg | ORAL_TABLET | Freq: Every day | ORAL | Status: DC
  Administered 2018-08-08 – 2018-08-09 (×2): 325 mg
  Filled 2018-08-08 (×2): qty 1

## 2018-08-08 MED ORDER — FUROSEMIDE 10 MG/ML IJ SOLN
40.0000 mg | Freq: Once | INTRAMUSCULAR | Status: AC
Start: 2018-08-08 — End: 2018-08-08
  Administered 2018-08-08: 40 mg via INTRAVENOUS
  Filled 2018-08-08: qty 4

## 2018-08-08 NOTE — Progress Notes (Signed)
Nutrition Follow-up  DOCUMENTATION CODES:   Not applicable  INTERVENTION:  Will monitor for diet advancement and recommend appropriate nutrition intervention at that time.  NUTRITION DIAGNOSIS:   Inadequate oral intake related to inability to eat as evidenced by NPO status.  Ongoing.  GOAL:   Provide needs based on ASPEN/SCCM guidelines  Not progressing at this time.  MONITOR:   Vent status, Labs, Weight trends, TF tolerance, I & O's  REASON FOR ASSESSMENT:   Ventilator    ASSESSMENT:   77 year old female with PMHx of DM, GERD, HTN, HLD, vitamin D deficiency, chronic tobacco abuse, COPD who is admitted with acute respiratory failure with hypoxemia and hypercapnia secondary to acute exacerbation of COPD requiring intubation 8/10, PNA, sepsis.  -Tube feeds were never initiated on 8/11 due to bloody aspirate from OGT. -Patient was extubated on 8/12 in AM. She subsequently vomited and presumably aspirated with subsequent respiratory distress requiring reintubation. -Per GI note from 8/12 okay to start tube feeds from GI standpoint. -Patient was extubated again this AM.  Patient just extubated this AM. She has not received any nutrition yet this admission, but she does not meet criteria for malnutrition. Will monitor for diet advancement once appropriate. Currently has NGT to LIS.  Medications reviewed and include: Novolog 0-15 units Q4hrs, pantoprazole, D5LR at 50 mL/hr now off, Zosyn.  Labs reviewed: CBG 183-206, Chloride 112, BUN 28, Creatinine 1.17, Anion gap 4, BNP 1069.  Discussed with RN and on rounds.  Diet Order:   Diet Order            Diet NPO time specified  Diet effective now              EDUCATION NEEDS:   No education needs have been identified at this time  Skin:  Skin Assessment: Reviewed RN Assessment  Last BM:  Unknown/PTA  Height:   Ht Readings from Last 1 Encounters:  08/05/18 5\' 7"  (1.702 m)    Weight:   Wt Readings from Last 1  Encounters:  08/07/18 75.5 kg    Ideal Body Weight:  61.4 kg  BMI:  Body mass index is 26.07 kg/m.  Estimated Nutritional Needs:   Kcal:  1625-1895 (MSJ x 1.2-1.4)  Protein:  90-105 grams (1.2-1.4 grams/kg)  Fluid:  1.5-1.8 L/day (25-30 mL/kg IBW)  Willey Blade, MS, RD, LDN Office: [phone] Pager: [phone] After Hours/Weekend Pager: [phone]

## 2018-08-08 NOTE — Progress Notes (Signed)
PT Cancellation Note  Patient Details Name: Brittney [name] MRN: [id number] DOB: [birth year]   Cancelled Treatment:    Reason Eval/Treat Not Completed: Patient not medically ready;Other (comment)(Per chart, patient extubated this AM. Will hold PT evaluation until tomorrow to ensure respiratory stability prior to exertional activities. Of note, current cardio consult pending for elevated troponin and EKG.)   Lieutenant Diego PT, DPT 1:44 PM,08/08/18  [phone]

## 2018-08-08 NOTE — Progress Notes (Signed)
[facility] [place] NAME: Brittney [name]    MR#:  [id number]  DATE OF BIRTH:  Oct 21, 1941  SUBJECTIVE:  CHIEF COMPLAINT:   Chief Complaint  Patient presents with  . Shortness of Breath  Patient seen and evaluated today Was reintubated yesterday afternoon has oxygen saturation dropped after patient vomited Extubated this morning On oxygen via nasal cannula   REVIEW OF SYSTEMS:    ROS Review of Systems  Constitutional: Negative.   HENT: Negative.   Eyes: Negative.   Respiratory: Has cough Cardiovascular: Negative.   Gastrointestinal: Had vomiting Genitourinary: Negative.   Musculoskeletal: Negative.   Skin: Negative.   Neurological: Negative.   Endo/Heme/Allergies: Negative.   Psychiatric/Behavioral: Negative.   All other systems reviewed and are negative.  DRUG ALLERGIES:   Allergies  Allergen Reactions  . Accupril [Quinapril Hcl]     VITALS:  Blood pressure (!) 160/70, pulse (!) 107, temperature 98.6 F (37 C), resp. rate (!) 24, height 5\' 7"  (1.702 m), weight 75.5 kg, SpO2 100 %.  PHYSICAL EXAMINATION:   Physical Exam  GENERAL:  77 y.o.-year-old patient lying in the bed on oxygen via nasal cannula EYES: Pupils equal, round, reactive to light and accommodation. No scleral icterus. Extraocular muscles intact.  HEENT: Head atraumatic, normocephalic. Oropharynx dry  NECK:  Supple, no jugular venous distention. No thyroid enlargement, no tenderness.  LUNGS: Normal breath sounds bilaterally, bibasilar rales heard.No use of accessory muscles of respiration.  CARDIOVASCULAR: S1, S2 normal. No murmurs, rubs, or gallops.  ABDOMEN: Soft, nontender, nondistended. Bowel sounds present. No organomegaly or mass.  EXTREMITIES: No cyanosis, clubbing or edema b/l.    NEUROLOGIC: Awake and alert and responds to all verbal commands Moves all extremities PSYCHIATRIC: Oriented to time place and person SKIN: No obvious rash, lesion, or  ulcer.   LABORATORY PANEL:   CBC Recent Labs  Lab 08/08/18 0409  WBC 19.9*  HGB 12.3  HCT 36.4  PLT 375   ------------------------------------------------------------------------------------------------------------------ Chemistries  Recent Labs  Lab 08/05/18 1355  08/07/18 0418 08/08/18 0409  NA 137   < > 141 141  K 3.7   < > 4.0 3.7  CL 104   < > 112* 112*  CO2 19*   < > 23 25  GLUCOSE 405*   < > 130* 198*  BUN 14   < > 24* 28*  CREATININE 1.16*   < > 1.33* 1.17*  CALCIUM 9.0   < > 8.1* 8.3*  MG  --    < > 2.1  --   AST 43*  --   --   --   ALT 22  --   --   --   ALKPHOS 64  --   --   --   BILITOT 0.7  --   --   --    < > = values in this interval not displayed.   ------------------------------------------------------------------------------------------------------------------  Cardiac Enzymes Recent Labs  Lab 08/08/18 0409  TROPONINI 1.35*   ------------------------------------------------------------------------------------------------------------------  RADIOLOGY:  Dg Abd 1 View  Result Date: 08/07/2018 CLINICAL DATA:  NG tube placement EXAM: ABDOMEN - 1 VIEW COMPARISON:  08/07/2018 FINDINGS: NG tube has been advanced with the tip in the distal stomach. Nonobstructive bowel gas pattern. IMPRESSION: NG tube tip in the distal stomach. Electronically Signed   By: Rolm Baptise M.D.   On: 08/07/2018 19:01   Dg Abd 1 View  Result Date: 08/07/2018 CLINICAL DATA:  NG tube placement. EXAM: ABDOMEN -  1 VIEW COMPARISON:  Radiograph dated 08/05/2018 FINDINGS: The tip of the NG tube is in the distal esophagus just proximal to the gastroesophageal junction. The visualized bowel gas pattern is normal. Diffuse accentuation of the interstitial markings of both lungs with Kerley B-lines suggesting interstitial edema are noted. IMPRESSION: NG tube tip at the gastroesophageal junction. Interstitial pulmonary edema, unchanged. Electronically Signed   By: Lorriane Shire M.D.   On:  08/07/2018 14:59   US Renal  Result Date: 08/06/2018 CLINICAL DATA:  Acute kidney injury EXAM: RENAL / URINARY TRACT ULTRASOUND COMPLETE COMPARISON:  None. FINDINGS: Right Kidney: Length: 11.7 cm. Echogenicity within normal limits. No mass or hydronephrosis visualized. Left Kidney: Length: 11.8 cm. Trace amount of perinephric fluid. Echogenicity within normal limits. No mass or hydronephrosis visualized. Bladder: Decompressed bladder with a Foley catheter present. Other: No focal lesion identified. Increased hepatic parenchymal echogenicity. IMPRESSION: 1. No obstructive uropathy.  Trace amount of left perinephric fluid. Electronically Signed   By: Kathreen Devoid   On: 08/06/2018 15:40   Portable Chest Xray  Result Date: 08/08/2018 CLINICAL DATA:  Intubation. EXAM: PORTABLE CHEST 1 VIEW COMPARISON:  08/07/2018. FINDINGS: NG tube has been uncoiled and repositioned. NG tube tip below left hemidiaphragm. NG tube, right PICC line in stable position. Mild cardiomegaly with bilateral interstitial prominence. Small right-sided pleural effusion. Findings suggest mild CHF. Pneumonitis cannot be excluded. No pneumothorax. IMPRESSION: 1. NG tube has been uncoiled and repositioned. Its tip is below left hemidiaphragm. Endotracheal tube and right PICC line stable position. 2 cardiomegaly with bilateral interstitial prominence and small right pleural effusions suggesting mild CHF. Pneumonitis cannot be excluded. Electronically Signed   By: Marcello Moores  Register   On: 08/08/2018 06:50   Dg Chest Port 1 View  Result Date: 08/07/2018 CLINICAL DATA:  PICC line placement EXAM: PORTABLE CHEST 1 VIEW COMPARISON:  08/07/2018 FINDINGS: 1759 hours. Diffuse pulmonary edema pattern persists in the lungs. Small bilateral pleural effusions evident, right greater than left. Cardiopericardial silhouette is at upper limits of normal for size. Endotracheal tube tip is approximately 2 cm above the base of the carina. Right PICC line tip is  positioned over the distal SVC. NG tube tip is in the proximal stomach with proximal port of the NG tube above the EG junction. NG tube remains coiled in the hypopharynx and proximal esophagus. IMPRESSION: 1. NG tube remains coiled in the hypopharynx and proximal esophagus. Tube should be withdrawn and repositioned for appropriate placement. 2. Right PICC line tip overlies the distal SVC. 3. Stable pulmonary edema pattern with small bilateral pleural effusions. These results will be called to the ordering clinician or representative by the Radiologist Assistant, and communication documented in the PACS or zVision Dashboard. Electronically Signed   By: Misty Stanley M.D.   On: 08/07/2018 18:21   Portable Chest X-ray  Result Date: 08/07/2018 CLINICAL DATA:  Endotracheal tube placement. NG tube placement. Pulmonary edema. EXAM: PORTABLE CHEST 1 VIEW 2:28 p.m. COMPARISON:  Chest x-ray dated 08/07/2018 at 1:36 p.m. and chest x-rays dated 08/06/2018 and 08/05/2018 FINDINGS: Endotracheal tube tip is 5 cm above the carina. NG tube tip is at the gastroesophageal junction and needs to be advanced. NG tube is looped in the cervical esophagus. Bilateral interstitial pulmonary edema has improved. Small right pleural effusion. Heart size is normal. Slight pulmonary vascular prominence. Aortic atherosclerosis. No acute bone abnormality. IMPRESSION: 1. Endotracheal tube in good position. 2. NG tube tip is at the gastroesophageal junction. The NG tube is looped in the  cervical esophagus. 3. Improved bilateral interstitial pulmonary edema. Small residual right effusion. No appreciable left effusion. 4.  Aortic Atherosclerosis (ICD10-I70.0). Electronically Signed   By: Lorriane Shire M.D.   On: 08/07/2018 15:01   Dg Chest Port 1 View  Result Date: 08/07/2018 CLINICAL DATA:  77 year old female with respiratory failure, pulmonary edema and/or pneumonia on recent chest CTA. EXAM: PORTABLE CHEST 1 VIEW COMPARISON:  0527 hours  today and earlier. FINDINGS: Portable AP semi upright view at 1336 hours. Cardiac and mediastinal contours remain normal. Calcified aortic atherosclerosis. Visualized tracheal air column is within normal limits. The patient has been extubated and the enteric tube removed. Acutely increased diffuse pulmonary interstitial opacity, although lung volumes are stable to mildly larger. Small bilateral pleural effusions suspected. No pneumothorax. The confluent lower lung airspace disease may persist in the form of retrocardiac opacity. Paucity of upper abdominal bowel gas. No acute osseous abnormality identified. IMPRESSION: 1. Extubated and enteric tube removed. 2. New bilateral pulmonary interstitial opacity favored to reflect acute pulmonary edema. 3. Confluent bilateral lower lobe airspace disease seen on 08/10 19 May persist. Suspected small bilateral pleural effusions Electronically Signed   By: Genevie Ann M.D.   On: 08/07/2018 14:03   Dg Chest Port 1 View  Result Date: 08/07/2018 CLINICAL DATA:  Pneumonia EXAM: PORTABLE CHEST 1 VIEW COMPARISON:  08/06/2018 and prior radiographs FINDINGS: An endotracheal tube with tip 4.2 cm above the carina and NG tube entering the stomach again noted. Mild bilateral interstitial opacities and LOWER lung consolidation/atelectasis again noted. There is no evidence of pneumothorax or large pleural effusion. IMPRESSION: Slight advancement of endotracheal tube, otherwise unchanged appearance of the chest. Electronically Signed   By: Margarette Canada M.D.   On: 08/07/2018 07:16   [place] Ekg Site Rite  Result Date: 08/07/2018 If Site Rite image not attached, placement could not be confirmed due to current cardiac rhythm.    ASSESSMENT AND PLAN:  77 year old female patient currently in the ICU for respiratory failure on ventilator  -Acute hypoxic respiratory failure with hypercapnia Off mechanical ventilator Extubated this morning Continue monitoring oxygen saturations  -Acute COPD  exacerbation Nebulization treatments and IV steroids along with empiric antibiotics  -Aspiration pneumonia Continue IV Zosyn antibiotic Follow procalcitonin, lactic acid and cultures CTA chest no pulmonary embolism Mediastinal and hilar adenopathy probably reactive in etiology  -Cholelithiasis No evidence of any inflammation  -Status post gastroenterology evaluation Okay to restart tube feeds Continue IV PPI for Protonix 40 mg twice daily Monitor hemoglobin Endoscopy deferred for now  -Type 2 diabetes mellitus Glycemic control strictly  -Reactive thrombocytosis improving  All the records are reviewed and case discussed with Care Management/Social Worker. Management plans discussed with the patient, family and they are in agreement.  CODE STATUS: Full code  DVT Prophylaxis: SCDs  TOTAL TIME TAKING CARE OF THIS PATIENT: 33 minutes.   POSSIBLE D/C IN 3 to 4 DAYS, DEPENDING ON CLINICAL CONDITION.  Saundra Shelling M.D on 08/08/2018 at 11:53 AM  Between 7am to 6pm - Pager - [phone]  After 6pm go to [email or web address] - password [email or web address] [facility]  Office  [phone]  CC: Primary care physician; System, Pcp Not In  Note: This dictation was prepared with Dragon dictation along with smaller phrase technology. Any transcriptional errors that result from this process are unintentional.

## 2018-08-08 NOTE — Progress Notes (Addendum)
Name: Brittney [name] MRN: [id number] DOB: Nov 26, 1941     CONSULTATION DATE: 08/05/2018  Subjective & Objectives: Reintubated 8/12, extubated in am 8/13 Sleeping, arouses to voice  Denies chest pain or SOB 2L [place], Afebrile   PAST MEDICAL HISTORY :   has a past medical history of Diabetes mellitus without complication ([place]), GERD (gastroesophageal reflux disease), Hyperlipidemia, Hypertension, and Vitamin D deficiency.  has a past surgical history that includes Colonoscopy. Prior to Admission medications   Medication Sig Start Date End Date Taking? Authorizing Provider  amLODipine (NORVASC) 10 MG tablet Take 10 mg by mouth daily.   Yes [provider]  aspirin 81 MG tablet Take 81 mg by mouth daily.   Yes [provider]  atorvastatin (LIPITOR) 40 MG tablet Take 1 tablet by mouth daily. 07/04/18  Yes [provider]  cetirizine (ZYRTEC) 10 MG tablet Take 10 mg by mouth daily.    Yes [provider]  glipiZIDE (GLUCOTROL XL) 10 MG 24 hr tablet Take 10 mg by mouth 2 (two) times daily.   Yes [provider]  Liraglutide (VICTOZA) 18 MG/3ML SOPN Inject 1.8 mg into the skin every morning.   Yes [provider]  losartan (COZAAR) 100 MG tablet Take 100 mg by mouth daily.   Yes [provider]  metFORMIN (GLUCOPHAGE) 1000 MG tablet Take 1,000 mg by mouth 2 (two) times daily with a meal.   Yes [provider]  omeprazole (PRILOSEC) 20 MG capsule Take 20 mg by mouth daily.   Yes [provider]  ranitidine (ZANTAC) 150 MG tablet Take 1 tablet by mouth 2 (two) times daily. 05/06/18  Yes [provider]  sitaGLIPtin (JANUVIA) 100 MG tablet Take 100 mg by mouth daily.   Yes [provider]  albuterol (PROVENTIL HFA;VENTOLIN HFA) 108 (90 BASE) MCG/ACT inhaler Inhale 2 puffs into the lungs every 6 (six) hours as needed for wheezing or shortness of breath.     [provider]   Allergies    Allergen Reactions  . Accupril [Quinapril Hcl]     FAMILY HISTORY:  family history is not on file. SOCIAL HISTORY:  reports that she has been smoking cigarettes. She has a 40.00 pack-year smoking history. She has never used smokeless tobacco. She reports that she does not drink alcohol or use drugs.  REVIEW OF SYSTEMS:   Positives in BOLD:   Pt denies any complaints Gen: Denies fever, chills, weight change, fatigue, night sweats HEENT: Denies blurred vision, double vision, hearing loss, tinnitus, sinus congestion, rhinorrhea, sore throat, neck stiffness, dysphagia PULM: Denies shortness of breath, +cough, sputum production, hemoptysis, wheezing CV: Denies chest pain, edema, orthopnea, paroxysmal nocturnal dyspnea, palpitations GI: Denies abdominal pain, +nausea, +vomiting, diarrhea, hematochezia, melena, constipation, change in bowel habits GU: Denies dysuria, hematuria, polyuria, oliguria, urethral discharge Endocrine: Denies hot or cold intolerance, polyuria, polyphagia or appetite change Derm: Denies rash, dry skin, scaling or peeling skin change Heme: Denies easy bruising, bleeding, bleeding gums Neuro: Denies headache, numbness, weakness, slurred speech, loss of memory or consciousness    VITAL SIGNS: Temp:  [98.4 F (36.9 C)-99.5 F (37.5 C)] 98.6 F (37 C) (08/13 1100) Pulse Rate:  [54-125] 107 (08/13 1100) Resp:  [8-41] 24 (08/13 1100) BP: (105-230)/(54-138) 160/70 (08/13 1100) SpO2:  [79 %-100 %] 100 % (08/13 1100) FiO2 (%):  [35 %-60 %] 35 % (08/13 0745)  Physical Examination:  GENERAL: Chronically ill appearing female, laying in bed, on 2L [place], in NAD NEURO:  Sleeping, arouses to voice, Oriented to person, place, and time.  Follows commands, no focal deficits HEENT: Atraumatic, normocephalic, neck supple, No JVD CARDIAC: RRR, s1s2, No M/R/G, 2+ pulses throughout PULMONARY: Clear breath sounds bilaterally, even, symmetrical, normal effort GI: Soft, nontender,  nondistended, BS active x4 MUSCULOSKELETAL: No deformities, active ROM all extremities SKIN: Warm, dry.  No obvious rashes, lesions, or ulcerations   ASSESSMENT / PLAN:  PULMONARY A: Acute respiratory failure with hypoxemia and hypercapnia. Intubated on 08/05/2018.>>Extubated 08/07/18>>reintubated 8/12>>extubated 8/13 -CT Chest negative PE AECOPD Pneumonia (Aspiration) P: -Reintubated 8/12, extubated in am 8/13 -Supplemental O2 prn to maintain O2 sats 88 to 92% -Continue Bronchodilators -Continue Zosyn -Follow sputum culture -Follow intermittent CXR   CARDIAC A: Septic shock>>resolved Dyslipidemia. Elevated troponin, ?NSTEMI vs Demand ischemia  -EKG with T wave inversion and slight ST depression in anteriolateral leads CXR 8/13 with mild CHF -BNP 1069 P: -Cardiac monitoring -Maintain MAP>65 -Trend Troponin -Cardiology consulted, appreciate input -Continue statin -Discussed with Dr. Alva Garnet 8/13, will Start Lovenox prophylactic dose -Received 40 Lasix 8/13 x2 doses   INFECTIOUS A: Leukocytosis in setting of UTI & PNA -Urine culture growing E.coli>>pan sensitive P: Monitor fever curve Trend WBC Follow cultures Continue Zosyn Trend PCT  RENAL A: AKI>>improving -Renal US>> No  Obstructive uropathy Non-anion gap metabolic acidosis>>resolved P: Monitor I&O's / urinary output Follow BMP Ensure adequate renal perfusion Avoid nephrotoxic agents as able Replace electrolytes as indicated Decrease IVF: D5LR @ 25 while NPO given pulmonary edema on CXR this AM  GI A: GI bleed with bloody aspirate via OGT Cholelithiasis with no signs of acute cholecystitis N/V Hx: GERD P: NPO Protonix  GI following, appreciate input>>per GI, low likelihood of GI bleed Prn Zofran NGT to LIS  ENDOCRINE A: Diabetes Mellitus P: CBG's SSI  Follow ICU hypo/hyperglycemia protocol  HEME A: Anemia  Reactive thrombocytosis>>resolved P: Monitor for s/sx of  bleeding Trend CBC Start Lovenox for VTE prophylaxis (GI bleed unlikely per GI) Transfuse for Hgb<8  Disposition: StepDown,will monitor overnight. Family: No family at bedside during NP rounds 08/08/18. Goals of care: Full Code   Darel Hong, AGACNP-BC [facility] Pager: [phone]

## 2018-08-08 NOTE — Progress Notes (Signed)
20 mg Hydralazine given

## 2018-08-08 NOTE — Progress Notes (Signed)
Pharmacy Lovenox Dosing  77 y.o. female admitted with SOB. Pharmacy consulted to initiate Lovenox for DVT prophylaxis.    Filed Weights   08/05/18 1802 08/06/18 2300 08/07/18 0411  Weight: 182 lb 15.7 oz (83 kg) 166 lb 7.2 oz (75.5 kg) 166 lb 7.2 oz (75.5 kg)    Body mass index is 26.07 kg/m.  Estimated Creatinine Clearance: 42.7 mL/min (A) (by C-G formula based on SCr of 1.17 mg/dL (H)).  Will begin Lovenox 40 mg daily.   Ulice Dash D 08/08/2018 2:50 PM

## 2018-08-08 NOTE — Progress Notes (Signed)
Patient extubated to 4lpm [place].  Tolerating well HR 104 oxygen saturations 100% RR 20.

## 2018-08-09 ENCOUNTER — Inpatient Hospital Stay: Payer: Medicare HMO

## 2018-08-09 DIAGNOSIS — J810 Acute pulmonary edema: Secondary | ICD-10-CM

## 2018-08-09 LAB — BASIC METABOLIC PANEL
Anion gap: 7 (ref 5–15)
BUN: 28 mg/dL — ABNORMAL HIGH (ref 8–23)
CO2: 27 mmol/L (ref 22–32)
Calcium: 8.5 mg/dL — ABNORMAL LOW (ref 8.9–10.3)
Chloride: 110 mmol/L (ref 98–111)
Creatinine, Ser: 0.99 mg/dL (ref 0.44–1.00)
GFR calc Af Amer: >60 mL/min (ref >60)
GFR calc non Af Amer: 54 mL/min — ABNORMAL LOW (ref >60)
Glucose, Bld: 171 mg/dL — ABNORMAL HIGH (ref 70–99)
Potassium: 3.1 mmol/L — ABNORMAL LOW (ref 3.5–5.1)
Sodium: 144 mmol/L (ref 135–145)

## 2018-08-09 LAB — GLUCOSE, CAPILLARY
Glucose-Capillary: 158 mg/dL — ABNORMAL HIGH (ref 70–99)
Glucose-Capillary: 161 mg/dL — ABNORMAL HIGH (ref 70–99)
Glucose-Capillary: 210 mg/dL — ABNORMAL HIGH (ref 70–99)
Glucose-Capillary: 241 mg/dL — ABNORMAL HIGH (ref 70–99)
Glucose-Capillary: 209 mg/dL — ABNORMAL HIGH (ref 70–99)

## 2018-08-09 LAB — CBC
HCT: 34.1 % — ABNORMAL LOW (ref 35.0–47.0)
Hemoglobin: 11.6 g/dL — ABNORMAL LOW (ref 12.0–16.0)
MCH: 27.5 pg (ref 26.0–34.0)
MCHC: 34.1 g/dL (ref 32.0–36.0)
MCV: 80.8 fL (ref 80.0–100.0)
Platelets: 354 10*3/uL (ref 150–440)
RBC: 4.22 MIL/uL (ref 3.80–5.20)
RDW: 15.2 % — ABNORMAL HIGH (ref 11.5–14.5)
WBC: 15 10*3/uL — ABNORMAL HIGH (ref 3.6–11.0)

## 2018-08-09 LAB — MAGNESIUM: Magnesium: 1.9 mg/dL (ref 1.7–2.4)

## 2018-08-09 LAB — TROPONIN I: Troponin I: 1 ng/mL (ref <0.03)

## 2018-08-09 LAB — PROCALCITONIN: Procalcitonin: 0.5 ng/mL

## 2018-08-09 MED ORDER — INSULIN ASPART 100 UNIT/ML ~~LOC~~ SOLN
0.0000 [IU] | Freq: Every day | SUBCUTANEOUS | Status: DC
  Administered 2018-08-09: 2 [IU] via SUBCUTANEOUS
  Administered 2018-08-10: 3 [IU] via SUBCUTANEOUS
  Filled 2018-08-09 (×2): qty 1

## 2018-08-09 MED ORDER — INSULIN ASPART 100 UNIT/ML ~~LOC~~ SOLN
0.0000 [IU] | Freq: Three times a day (TID) | SUBCUTANEOUS | Status: DC
  Administered 2018-08-09 (×2): 5 [IU] via SUBCUTANEOUS
  Administered 2018-08-10: 8 [IU] via SUBCUTANEOUS
  Administered 2018-08-10: 5 [IU] via SUBCUTANEOUS
  Administered 2018-08-10: 8 [IU] via SUBCUTANEOUS
  Administered 2018-08-11: 5 [IU] via SUBCUTANEOUS
  Filled 2018-08-09 (×6): qty 1

## 2018-08-09 MED ORDER — ASPIRIN 325 MG PO TABS
325.0000 mg | ORAL_TABLET | Freq: Every day | ORAL | Status: DC
  Administered 2018-08-10 – 2018-08-11 (×2): 325 mg via ORAL
  Filled 2018-08-09 (×2): qty 1

## 2018-08-09 MED ORDER — AMLODIPINE BESYLATE 10 MG PO TABS
10.0000 mg | ORAL_TABLET | Freq: Every day | ORAL | Status: DC
  Administered 2018-08-09 – 2018-08-11 (×3): 10 mg via ORAL
  Filled 2018-08-09 (×3): qty 1

## 2018-08-09 MED ORDER — METOPROLOL TARTRATE 25 MG PO TABS
25.0000 mg | ORAL_TABLET | Freq: Two times a day (BID) | ORAL | Status: DC
  Administered 2018-08-09 – 2018-08-10 (×2): 25 mg via ORAL
  Filled 2018-08-09 (×2): qty 1

## 2018-08-09 MED ORDER — POTASSIUM CHLORIDE 20 MEQ/15ML (10%) PO SOLN
40.0000 meq | Freq: Two times a day (BID) | ORAL | Status: AC
  Administered 2018-08-09: 40 meq via ORAL
  Filled 2018-08-09: qty 30

## 2018-08-09 MED ORDER — CHLORHEXIDINE GLUCONATE 0.12 % MT SOLN
OROMUCOSAL | Status: AC
  Filled 2018-08-09: qty 15

## 2018-08-09 MED ORDER — ACETAMINOPHEN 650 MG RE SUPP: 650.0000 mg | Freq: Four times a day (QID) | RECTAL | Status: DC | PRN

## 2018-08-09 MED ORDER — POTASSIUM CHLORIDE 20 MEQ/15ML (10%) PO SOLN
40.0000 meq | Freq: Two times a day (BID) | ORAL | Status: DC
  Administered 2018-08-09: 40 meq
  Filled 2018-08-09 (×2): qty 30

## 2018-08-09 MED ORDER — ATORVASTATIN CALCIUM 20 MG PO TABS
40.0000 mg | ORAL_TABLET | Freq: Every day | ORAL | Status: DC
  Administered 2018-08-10 – 2018-08-11 (×2): 40 mg via ORAL
  Filled 2018-08-09 (×2): qty 2

## 2018-08-09 MED ORDER — IPRATROPIUM-ALBUTEROL 0.5-2.5 (3) MG/3ML IN SOLN
3.0000 mL | RESPIRATORY_TRACT | Status: DC | PRN
  Administered 2018-08-10 (×2): 3 mL via RESPIRATORY_TRACT
  Filled 2018-08-09 (×3): qty 3

## 2018-08-09 MED ORDER — DEXTROSE 5 % IV SOLN
INTRAVENOUS | Status: DC
  Administered 2018-08-09: 09:00:00 via INTRAVENOUS

## 2018-08-09 MED ORDER — ACETAMINOPHEN 325 MG PO TABS: 650.0000 mg | ORAL_TABLET | Freq: Four times a day (QID) | ORAL | Status: DC | PRN

## 2018-08-09 MED ORDER — FUROSEMIDE 10 MG/ML IJ SOLN
40.0000 mg | Freq: Once | INTRAMUSCULAR | Status: AC
  Administered 2018-08-09: 40 mg via INTRAVENOUS
  Filled 2018-08-09: qty 4

## 2018-08-09 MED ORDER — POTASSIUM CHLORIDE 10 MEQ/50ML IV SOLN
10.0000 meq | INTRAVENOUS | Status: AC
  Administered 2018-08-09 (×4): 10 meq via INTRAVENOUS
  Filled 2018-08-09 (×4): qty 50

## 2018-08-09 NOTE — Consult Note (Signed)
[facility] Cardiology Consultation Note  Patient ID: Brittney [name], MRN: [id number], DOB/AGE: [birth year] 77 y.o. Admit date: 08/05/2018   Date of Consult: 08/09/2018 Primary Physician: System, Pcp Not In Primary Cardiologist: None  Chief Complaint:  Chief Complaint  Patient presents with  . Shortness of Breath   Reason for Consult: Shortness of breath with beta troponin  HPI: 77 y.o. female with known diabetes essential hypertension mixed hyperlipidemia and chronic kidney disease with glomerular filtration rate of 36 having appropriate medication management and risk of prevention by medications over the last many years.  The patient has recently had an acute respiratory failure for which the patient was intubated and has had appropriate supportive care and now extubated.  During this time the patient has not had any apparent EKG changes with only sinus tachycardia will by telemetry.  She does have an elevated troponin of 0.88 most consistent with demand ischemia and/or hypoxia.  Since extubation the patient has not had any evidence of chest discomfort or congestive heart failure symptoms.  Currently she has been hemodynamically stable weak and fatigued but no evidence of further worsening of symptoms  Past Medical History:  Diagnosis Date  . Diabetes mellitus without complication ([place])   . GERD (gastroesophageal reflux disease)   . Hyperlipidemia   . Hypertension   . Vitamin D deficiency       Surgical History:  Past Surgical History:  Procedure Laterality Date  . COLONOSCOPY       Home Meds: Prior to Admission medications   Medication Sig Start Date End Date Taking? Authorizing Provider  amLODipine (NORVASC) 10 MG tablet Take 10 mg by mouth daily.   Yes [provider]  aspirin 81 MG tablet Take 81 mg by mouth daily.   Yes [provider]  atorvastatin (LIPITOR) 40 MG tablet Take 1 tablet by mouth daily. 07/04/18  Yes [provider]  cetirizine  (ZYRTEC) 10 MG tablet Take 10 mg by mouth daily.    Yes [provider]  glipiZIDE (GLUCOTROL XL) 10 MG 24 hr tablet Take 10 mg by mouth 2 (two) times daily.   Yes [provider]  Liraglutide (VICTOZA) 18 MG/3ML SOPN Inject 1.8 mg into the skin every morning.   Yes [provider]  losartan (COZAAR) 100 MG tablet Take 100 mg by mouth daily.   Yes [provider]  metFORMIN (GLUCOPHAGE) 1000 MG tablet Take 1,000 mg by mouth 2 (two) times daily with a meal.   Yes [provider]  omeprazole (PRILOSEC) 20 MG capsule Take 20 mg by mouth daily.   Yes [provider]  ranitidine (ZANTAC) 150 MG tablet Take 1 tablet by mouth 2 (two) times daily. 05/06/18  Yes [provider]  sitaGLIPtin (JANUVIA) 100 MG tablet Take 100 mg by mouth daily.   Yes [provider]  albuterol (PROVENTIL HFA;VENTOLIN HFA) 108 (90 BASE) MCG/ACT inhaler Inhale 2 puffs into the lungs every 6 (six) hours as needed for wheezing or shortness of breath.     [provider]    Inpatient Medications:  . amLODipine  10 mg Oral Daily  . [START ON 08/10/2018] aspirin  325 mg Oral Daily  . [START ON 08/10/2018] atorvastatin  40 mg Oral Daily  . chlorhexidine gluconate (MEDLINE KIT)  15 mL Mouth Rinse BID  . enoxaparin (LOVENOX) injection  40 mg Subcutaneous Q24H  . insulin aspart  0-15 Units Subcutaneous TID WC  . insulin aspart  0-5 Units Subcutaneous QHS  .  mouth rinse  15 mL Mouth Rinse 10 times per day  . metoprolol tartrate  25 mg Oral BID  . pantoprazole  40 mg Intravenous QHS  . potassium chloride  40 mEq Oral BID     Allergies:  Allergies  Allergen Reactions  . Accupril [Quinapril Hcl]     Social History   Socioeconomic History  . Marital status: Widowed    Spouse name: Not on file  . Number of children: Not on file  . Years of education: Not on file  . Highest education level: Not on file  Occupational History  . Not on file   Social Needs  . Financial resource strain: Not on file  . Food insecurity:    Worry: Not on file    Inability: Not on file  . Transportation needs:    Medical: Not on file    Non-medical: Not on file  Tobacco Use  . Smoking status: Current Every Day Smoker    Packs/day: 1.00    Years: 40.00    Pack years: 40.00    Types: Cigarettes  . Smokeless tobacco: Never Used  Substance and Sexual Activity  . Alcohol use: No  . Drug use: No  . Sexual activity: Not on file  Lifestyle  . Physical activity:    Days per week: Not on file    Minutes per session: Not on file  . Stress: Not on file  Relationships  . Social connections:    Talks on phone: Not on file    Gets together: Not on file    Attends religious service: Not on file    Active member of club or organization: Not on file    Attends meetings of clubs or organizations: Not on file    Relationship status: Not on file  . Intimate partner violence:    Fear of current or ex partner: Not on file    Emotionally abused: Not on file    Physically abused: Not on file    Forced sexual activity: Not on file  Other Topics Concern  . Not on file  Social History Narrative  . Not on file     Family History  Problem Relation Age of Onset  . Diabetes Neg Hx   . Hypertension Neg Hx      Review of Systems Positive for weakness shortness of breath cough congestion Negative for: General:  chills, fever, night sweats or weight changes.  Cardiovascular: PND orthopnea syncope dizziness  Dermatological skin lesions rashes Respiratory: Positive for cough congestion Urologic: Frequent urination urination at night and hematuria Abdominal: negative for nausea, vomiting, diarrhea, bright red blood per rectum, melena, or hematemesis Neurologic: negative for visual changes, and/or hearing changes  All other systems reviewed and are otherwise negative except as noted above.  Labs: Recent Labs    08/08/18 1118 08/08/18 1734  08/08/18 2106 08/09/18 0306  TROPONINI 1.75* 1.58* 1.01* 1.00*   Lab Results  Component Value Date   WBC 15.0 (H) 08/09/2018   HGB 11.6 (L) 08/09/2018   HCT 34.1 (L) 08/09/2018   MCV 80.8 08/09/2018   PLT 354 08/09/2018    Recent Labs  Lab 08/05/18 1355  08/09/18 0307  NA 137   < > 144  K 3.7   < > 3.1*  CL 104   < > 110  CO2 19*   < > 27  BUN 14   < > 28*  CREATININE 1.16*   < > 0.99  CALCIUM 9.0   < >  8.5*  PROT 7.5  --   --   BILITOT 0.7  --   --   ALKPHOS 64  --   --   ALT 22  --   --   AST 43*  --   --   GLUCOSE 405*   < > 171*   < > = values in this interval not displayed.   No results found for: CHOL, HDL, LDLCALC, TRIG No results found for: DDIMER  Radiology/Studies:  Dg Abd 1 View  Result Date: 08/07/2018 CLINICAL DATA:  NG tube placement EXAM: ABDOMEN - 1 VIEW COMPARISON:  08/07/2018 FINDINGS: NG tube has been advanced with the tip in the distal stomach. Nonobstructive bowel gas pattern. IMPRESSION: NG tube tip in the distal stomach. Electronically Signed   By: Rolm Baptise M.D.   On: 08/07/2018 19:01   Dg Abd 1 View  Result Date: 08/07/2018 CLINICAL DATA:  NG tube placement. EXAM: ABDOMEN - 1 VIEW COMPARISON:  Radiograph dated 08/05/2018 FINDINGS: The tip of the NG tube is in the distal esophagus just proximal to the gastroesophageal junction. The visualized bowel gas pattern is normal. Diffuse accentuation of the interstitial markings of both lungs with Kerley B-lines suggesting interstitial edema are noted. IMPRESSION: NG tube tip at the gastroesophageal junction. Interstitial pulmonary edema, unchanged. Electronically Signed   By: Lorriane Shire M.D.   On: 08/07/2018 14:59   Dg Abd 1 View  Result Date: 08/05/2018 CLINICAL DATA:  Evaluate NG tube EXAM: ABDOMEN - 1 VIEW COMPARISON:  August 05, 2018 FINDINGS: The distal tip of the NG tube is in the gastric fundus. The side port is near the GE junction. IMPRESSION: The side port of the NG tube is near the GE  junction. Consider advancement. Electronically Signed   By: Dorise Bullion III M.D   On: 08/05/2018 20:41   Ct Angio Chest Pe W And/or Wo Contrast  Result Date: 08/05/2018 CLINICAL DATA:  Respiratory distress, diaphoresis and lethargy. Intubated. Acute, generalized abdominal pain. EXAM: CT ANGIOGRAPHY CHEST CT ABDOMEN AND PELVIS WITH CONTRAST TECHNIQUE: Multidetector CT imaging of the chest was performed using the standard protocol during bolus administration of intravenous contrast. Multiplanar CT image reconstructions and MIPs were obtained to evaluate the vascular anatomy. Multidetector CT imaging of the abdomen and pelvis was performed using the standard protocol during bolus administration of intravenous contrast. CONTRAST:  13m ISOVUE-370 IOPAMIDOL (ISOVUE-370) INJECTION 76% COMPARISON:  None. FINDINGS: CTA CHEST FINDINGS Cardiovascular: Normally opacified pulmonary arteries with no pulmonary arterial filling defects seen. Atheromatous calcifications, including the coronary arteries and aorta. Borderline enlarged heart. No pericardial effusion. Mediastinum/Nodes: Mildly prominent precarinal node with a short axis diameter of 10 mm on image number 40 series 4. Mildly enlarged right hilar nodes, the largest with a short axis diameter of 11 mm on image number 54 series 4. Enlarged left hilar nodes, the largest with a short axis diameter of 10 mm on image number 52 series 4. Endotracheal 2 tip 2.7 cm above the carina. Nasogastric tube extending into the stomach. Lungs/Pleura: Dense, patchy and confluent airspace opacity in both lower lobes. Diffusely prominent interstitial markings with septal edema. Small bilateral pleural effusions. Musculoskeletal: Thoracic and lower cervical spine degenerative changes. Review of the MIP images confirms the above findings. CT ABDOMEN and PELVIS FINDINGS Hepatobiliary: Multiple gallstones in the gallbladder measuring up to 2.0 cm in maximum diameter each. No gallbladder  wall thickening or pericholecystic fluid. Normal appearing liver. Pancreas: Unremarkable. No pancreatic ductal dilatation or surrounding inflammatory changes.  Spleen: Normal in size without focal abnormality. Adrenals/Urinary Tract: Small bilateral renal cysts. Normal appearing adrenal glands and ureters. Foley catheter in the urinary bladder with associated air in the bladder. Stomach/Bowel: Single distal descending colon diverticulum. Nasogastric tube tip in the mid to proximal stomach. Unremarkable small bowel. No evidence of appendicitis. Vascular/Lymphatic: Atheromatous arterial calcifications without aneurysm. No enlarged lymph nodes. Reproductive: Normal sized uterus with multiple small peripheral calcifications. No adnexal mass. Other: No abdominal wall hernia or abnormality. No abdominopelvic ascites. Musculoskeletal: Lumbar and lower thoracic spine degenerative changes. Review of the MIP images confirms the above findings. IMPRESSION: 1. No pulmonary emboli. 2. Dense, patchy and confluent airspace opacity in both lower lobes. This is most likely due to pneumonia. Patchy atelectasis is less likely. 3. Borderline cardiomegaly with changes of acute congestive heart failure. 4. Mild mediastinal and bilateral hilar adenopathy, most likely reactive. 5. Cholelithiasis without evidence cholecystitis. 6.  Calcific coronary artery and aortic atherosclerosis. Aortic Atherosclerosis (ICD10-I70.0). Electronically Signed   By: Claudie Revering M.D.   On: 08/05/2018 15:40   Ct Abdomen Pelvis W Contrast  Result Date: 08/05/2018 CLINICAL DATA:  Respiratory distress, diaphoresis and lethargy. Intubated. Acute, generalized abdominal pain. EXAM: CT ANGIOGRAPHY CHEST CT ABDOMEN AND PELVIS WITH CONTRAST TECHNIQUE: Multidetector CT imaging of the chest was performed using the standard protocol during bolus administration of intravenous contrast. Multiplanar CT image reconstructions and MIPs were obtained to evaluate the vascular  anatomy. Multidetector CT imaging of the abdomen and pelvis was performed using the standard protocol during bolus administration of intravenous contrast. CONTRAST:  78m ISOVUE-370 IOPAMIDOL (ISOVUE-370) INJECTION 76% COMPARISON:  None. FINDINGS: CTA CHEST FINDINGS Cardiovascular: Normally opacified pulmonary arteries with no pulmonary arterial filling defects seen. Atheromatous calcifications, including the coronary arteries and aorta. Borderline enlarged heart. No pericardial effusion. Mediastinum/Nodes: Mildly prominent precarinal node with a short axis diameter of 10 mm on image number 40 series 4. Mildly enlarged right hilar nodes, the largest with a short axis diameter of 11 mm on image number 54 series 4. Enlarged left hilar nodes, the largest with a short axis diameter of 10 mm on image number 52 series 4. Endotracheal 2 tip 2.7 cm above the carina. Nasogastric tube extending into the stomach. Lungs/Pleura: Dense, patchy and confluent airspace opacity in both lower lobes. Diffusely prominent interstitial markings with septal edema. Small bilateral pleural effusions. Musculoskeletal: Thoracic and lower cervical spine degenerative changes. Review of the MIP images confirms the above findings. CT ABDOMEN and PELVIS FINDINGS Hepatobiliary: Multiple gallstones in the gallbladder measuring up to 2.0 cm in maximum diameter each. No gallbladder wall thickening or pericholecystic fluid. Normal appearing liver. Pancreas: Unremarkable. No pancreatic ductal dilatation or surrounding inflammatory changes. Spleen: Normal in size without focal abnormality. Adrenals/Urinary Tract: Small bilateral renal cysts. Normal appearing adrenal glands and ureters. Foley catheter in the urinary bladder with associated air in the bladder. Stomach/Bowel: Single distal descending colon diverticulum. Nasogastric tube tip in the mid to proximal stomach. Unremarkable small bowel. No evidence of appendicitis. Vascular/Lymphatic: Atheromatous  arterial calcifications without aneurysm. No enlarged lymph nodes. Reproductive: Normal sized uterus with multiple small peripheral calcifications. No adnexal mass. Other: No abdominal wall hernia or abnormality. No abdominopelvic ascites. Musculoskeletal: Lumbar and lower thoracic spine degenerative changes. Review of the MIP images confirms the above findings. IMPRESSION: 1. No pulmonary emboli. 2. Dense, patchy and confluent airspace opacity in both lower lobes. This is most likely due to pneumonia. Patchy atelectasis is less likely. 3. Borderline cardiomegaly with changes of acute congestive  heart failure. 4. Mild mediastinal and bilateral hilar adenopathy, most likely reactive. 5. Cholelithiasis without evidence cholecystitis. 6.  Calcific coronary artery and aortic atherosclerosis. Aortic Atherosclerosis (ICD10-I70.0). Electronically Signed   By: Claudie Revering M.D.   On: 08/05/2018 15:40   US Renal  Result Date: 08/06/2018 CLINICAL DATA:  Acute kidney injury EXAM: RENAL / URINARY TRACT ULTRASOUND COMPLETE COMPARISON:  None. FINDINGS: Right Kidney: Length: 11.7 cm. Echogenicity within normal limits. No mass or hydronephrosis visualized. Left Kidney: Length: 11.8 cm. Trace amount of perinephric fluid. Echogenicity within normal limits. No mass or hydronephrosis visualized. Bladder: Decompressed bladder with a Foley catheter present. Other: No focal lesion identified. Increased hepatic parenchymal echogenicity. IMPRESSION: 1. No obstructive uropathy.  Trace amount of left perinephric fluid. Electronically Signed   By: Kathreen Devoid   On: 08/06/2018 15:40   Dg Chest Port 1 View  Result Date: 08/09/2018 CLINICAL DATA:  Respiratory failure. EXAM: PORTABLE CHEST 1 VIEW COMPARISON:  Radiograph of August 08, 2018. FINDINGS: Stable cardiomediastinal silhouette with central pulmonary vascular congestion is noted. Atherosclerosis of thoracic aorta is noted. Endotracheal tube has been removed. Nasogastric tube is  seen entering the stomach. Right-sided PICC line is unchanged in position. No pneumothorax or pleural effusion is noted. Stable bilateral pulmonary edema is noted. Bony thorax is unremarkable. IMPRESSION: Stable central pulmonary vascular congestion is noted and bilateral pulmonary edema. Endotracheal tube has been removed. Aortic Atherosclerosis (ICD10-I70.0). Electronically Signed   By: Marijo Conception, M.D.   On: 08/09/2018 07:11   Portable Chest Xray  Result Date: 08/08/2018 CLINICAL DATA:  Intubation. EXAM: PORTABLE CHEST 1 VIEW COMPARISON:  08/07/2018. FINDINGS: NG tube has been uncoiled and repositioned. NG tube tip below left hemidiaphragm. NG tube, right PICC line in stable position. Mild cardiomegaly with bilateral interstitial prominence. Small right-sided pleural effusion. Findings suggest mild CHF. Pneumonitis cannot be excluded. No pneumothorax. IMPRESSION: 1. NG tube has been uncoiled and repositioned. Its tip is below left hemidiaphragm. Endotracheal tube and right PICC line stable position. 2 cardiomegaly with bilateral interstitial prominence and small right pleural effusions suggesting mild CHF. Pneumonitis cannot be excluded. Electronically Signed   By: Marcello Moores  Register   On: 08/08/2018 06:50   Dg Chest Port 1 View  Result Date: 08/07/2018 CLINICAL DATA:  PICC line placement EXAM: PORTABLE CHEST 1 VIEW COMPARISON:  08/07/2018 FINDINGS: 1759 hours. Diffuse pulmonary edema pattern persists in the lungs. Small bilateral pleural effusions evident, right greater than left. Cardiopericardial silhouette is at upper limits of normal for size. Endotracheal tube tip is approximately 2 cm above the base of the carina. Right PICC line tip is positioned over the distal SVC. NG tube tip is in the proximal stomach with proximal port of the NG tube above the EG junction. NG tube remains coiled in the hypopharynx and proximal esophagus. IMPRESSION: 1. NG tube remains coiled in the hypopharynx and proximal  esophagus. Tube should be withdrawn and repositioned for appropriate placement. 2. Right PICC line tip overlies the distal SVC. 3. Stable pulmonary edema pattern with small bilateral pleural effusions. These results will be called to the ordering clinician or representative by the Radiologist Assistant, and communication documented in the PACS or zVision Dashboard. Electronically Signed   By: Misty Stanley M.D.   On: 08/07/2018 18:21   Portable Chest X-ray  Result Date: 08/07/2018 CLINICAL DATA:  Endotracheal tube placement. NG tube placement. Pulmonary edema. EXAM: PORTABLE CHEST 1 VIEW 2:28 p.m. COMPARISON:  Chest x-ray dated 08/07/2018 at 1:36 p.m. and chest  x-rays dated 08/06/2018 and 08/05/2018 FINDINGS: Endotracheal tube tip is 5 cm above the carina. NG tube tip is at the gastroesophageal junction and needs to be advanced. NG tube is looped in the cervical esophagus. Bilateral interstitial pulmonary edema has improved. Small right pleural effusion. Heart size is normal. Slight pulmonary vascular prominence. Aortic atherosclerosis. No acute bone abnormality. IMPRESSION: 1. Endotracheal tube in good position. 2. NG tube tip is at the gastroesophageal junction. The NG tube is looped in the cervical esophagus. 3. Improved bilateral interstitial pulmonary edema. Small residual right effusion. No appreciable left effusion. 4.  Aortic Atherosclerosis (ICD10-I70.0). Electronically Signed   By: Lorriane Shire M.D.   On: 08/07/2018 15:01   Dg Chest Port 1 View  Result Date: 08/07/2018 CLINICAL DATA:  77 year old female with respiratory failure, pulmonary edema and/or pneumonia on recent chest CTA. EXAM: PORTABLE CHEST 1 VIEW COMPARISON:  0527 hours today and earlier. FINDINGS: Portable AP semi upright view at 1336 hours. Cardiac and mediastinal contours remain normal. Calcified aortic atherosclerosis. Visualized tracheal air column is within normal limits. The patient has been extubated and the enteric tube  removed. Acutely increased diffuse pulmonary interstitial opacity, although lung volumes are stable to mildly larger. Small bilateral pleural effusions suspected. No pneumothorax. The confluent lower lung airspace disease may persist in the form of retrocardiac opacity. Paucity of upper abdominal bowel gas. No acute osseous abnormality identified. IMPRESSION: 1. Extubated and enteric tube removed. 2. New bilateral pulmonary interstitial opacity favored to reflect acute pulmonary edema. 3. Confluent bilateral lower lobe airspace disease seen on 08/10 19 May persist. Suspected small bilateral pleural effusions Electronically Signed   By: Genevie Ann M.D.   On: 08/07/2018 14:03   Dg Chest Port 1 View  Result Date: 08/07/2018 CLINICAL DATA:  Pneumonia EXAM: PORTABLE CHEST 1 VIEW COMPARISON:  08/06/2018 and prior radiographs FINDINGS: An endotracheal tube with tip 4.2 cm above the carina and NG tube entering the stomach again noted. Mild bilateral interstitial opacities and LOWER lung consolidation/atelectasis again noted. There is no evidence of pneumothorax or large pleural effusion. IMPRESSION: Slight advancement of endotracheal tube, otherwise unchanged appearance of the chest. Electronically Signed   By: Margarette Canada M.D.   On: 08/07/2018 07:16   Dg Chest Port 1 View  Result Date: 08/06/2018 CLINICAL DATA:  Pneumonia, abnormal blood gas EXAM: PORTABLE CHEST 1 VIEW COMPARISON:  08/05/2018 FINDINGS: Endotracheal tube terminates 6.5 cm above the carina. Enteric tube courses into the proximal gastric body. Mild left basilar atelectasis. Possible small left pleural effusion. Right lung is essentially clear. No pneumothorax. The heart is normal in size. Thoracic aortic atherosclerosis. IMPRESSION: Endotracheal tube terminates 6.5 cm above the carina. Mild left basilar atelectasis with possible small left pleural effusion. Electronically Signed   By: Julian Hy M.D.   On: 08/06/2018 08:08   Dg Chest Port 1  View  Result Date: 08/05/2018 CLINICAL DATA:  ET and OG tube placement EXAM: PORTABLE CHEST 1 VIEW COMPARISON:  August 05, 2018 FINDINGS: Bilateral primarily interstitial pulmonary infiltrates remain. This was thought to represent pneumonia on the CT scan from earlier today. No pneumothorax. The ETT terminates in the mid trachea. The NG tube terminates with the side port near the GE junction in the distal tip in the stomach. The heart size is borderline. The hila and mediastinum are unremarkable. No pulmonary nodules or masses. IMPRESSION: 1. The ETT is in good position. 2. The side port of the NG tube is near the GE junction. Consider advancement.  3. Interstitial pulmonary infiltrates remain. This was thought to represent pneumonia on today's CT scan. Electronically Signed   By: Dorise Bullion III M.D   On: 08/05/2018 20:40   Dg Chest Port 1 View  Result Date: 08/05/2018 CLINICAL DATA:  Patient poor historian at this time. Unable to obtain hx at this time. Encounter for SOB. EXAM: PORTABLE CHEST - 1 VIEW COMPARISON:  02/16/2015 FINDINGS: Moderate interstitial edema or infiltrates and central pulmonary vascular congestion, new since previous. Heart size upper limits normal. Aortic Atherosclerosis (ICD10-170.0). No effusion.  No pneumothorax. Visualized bones unremarkable. IMPRESSION: 1. Moderate bilateral interstitial edema or infiltrates, new since previous. Electronically Signed   By: Lucrezia Europe M.D.   On: 08/05/2018 14:25   Dg Abd Portable 1v  Result Date: 08/05/2018 CLINICAL DATA:  NG tube placement. EXAM: PORTABLE ABDOMEN - 1 VIEW COMPARISON:  Current abdomen pelvis CT. FINDINGS: Nasogastric tube extends below the diaphragm with its tip in the proximal stomach. Side hole of the tube lies in the distal esophagus. IMPRESSION: NG tube tip in the proximal stomach. Electronically Signed   By: Lajean Manes M.D.   On: 08/05/2018 19:26   [place] Ekg Site Rite  Result Date: 08/07/2018 If Site Rite image not  attached, placement could not be confirmed due to current cardiac rhythm.   EKG: Sinus tachycardia with nonspecific ST changes  Weights: Filed Weights   08/05/18 1802 08/06/18 2300 08/07/18 0411  Weight: 83 kg 75.5 kg 75.5 kg     Physical Exam: Blood pressure 140/70, pulse 99, temperature 99 F (37.2 C), resp. rate (!) 24, height _0  (1.702 m), weight 75.5 kg, SpO2 95 %. Body mass index is 26.07 kg/m. General: Ill-appearing but well developed, well nourished, in no acute distress. Head eyes ears nose throat: Normocephalic, atraumatic, sclera non-icteric, no xanthomas, nares are without discharge. No apparent thyromegaly and/or mass  Lungs: Normal respiratory effort.  no wheezes, no rales, few's rhonchi.  Heart: RRR with normal S1 S2. no murmur gallop, no rub, PMI is normal size and placement, carotid upstroke normal without bruit, jugular venous pressure is normal Abdomen: Soft, non-tender, non-distended with normoactive bowel sounds. No hepatomegaly. No rebound/guarding. No obvious abdominal masses. Abdominal aorta is normal size without bruit Extremities: Trace edema. no cyanosis, no clubbing, no ulcers  Peripheral : 2+ bilateral upper extremity pulses, 2+ bilateral femoral pulses, 2+ bilateral dorsal pedal pulse Neuro: Alert and oriented. No facial asymmetry. No focal deficit. Moves all extremities spontaneously. Musculoskeletal: Normal muscle tone without kyphosis Psych:  Responds to questions appropriately with a normal affect.    Assessment: 77 year old female with significant cardiovascular risk factors of diabetes essential hypertension mixed hyperlipidemia chronic kidney disease with acute respiratory failure and hypoxia and an elevated troponin consistent with demand ischemia without evidence of acute coronary syndrome  Plan: 1.  Continue supportive care for significant hypoxia demand ischemia and respiratory failure 2.  Continue medication management for prevention care  of myocardial infarction including beta-blocker and amlodipine 3.  High intensity cholesterol therapy 4.  Further cardiac diagnostics necessary at this time due to no evidence of significant worsening of symptoms and patient is slowly recovering without apparent heart failure or angina 5.  Begin ambulation as able and further evaluation and treatment options at that time  Signed, Corey Skains M.D. [facility] 08/09/2018, 4:58 PM

## 2018-08-09 NOTE — Progress Notes (Signed)
PT Cancellation Note  Patient Details Name: Brittney [name] MRN: [id number] DOB: 08-Dec-1941   Cancelled Treatment:     Therapy order received and pt chart reviewed. Will hold eval/treat pending cardiology consult concerning pt's elevated troponin levels and abnormal EKG. Will re-attempt at later date/time as pt is available and medically appropriate.    Hortencia Conradi, SPT 08/09/18,8:34 AM

## 2018-08-09 NOTE — Progress Notes (Signed)
[facility] [place] NAME: Brittney [name]    MR#:  [id number]  DATE OF BIRTH:  [birth year]  SUBJECTIVE:  CHIEF COMPLAINT:   Chief Complaint  Patient presents with  . Shortness of Breath  Patient seen and evaluated today No vomitings Extubated yesterday On oxygen via nasal cannula Decreased shortness of breath   REVIEW OF SYSTEMS:    ROS Review of Systems  Constitutional: Negative.   HENT: Negative.   Eyes: Negative.   Respiratory: Has occasional cough Cardiovascular: Negative.   Gastrointestinal: No vomiting Genitourinary: Negative.   Musculoskeletal: Negative.   Skin: Negative.   Neurological: Negative.   Endo/Heme/Allergies: Negative.   Psychiatric/Behavioral: Negative.   All other systems reviewed and are negative.  DRUG ALLERGIES:   Allergies  Allergen Reactions  . Accupril [Quinapril Hcl]     VITALS:  Blood pressure (!) 165/70, pulse (!) 114, temperature 99.1 F (37.3 C), resp. rate 19, height 5\' 7"  (1.702 m), weight 75.5 kg, SpO2 97 %.  PHYSICAL EXAMINATION:   Physical Exam  GENERAL:  78 y.o.-year-old patient lying in the bed on oxygen via nasal cannula EYES: Pupils equal, round, reactive to light and accommodation. No scleral icterus. Extraocular muscles intact.  HEENT: Head atraumatic, normocephalic. Oropharynx dry  NECK:  Supple, no jugular venous distention. No thyroid enlargement, no tenderness.  LUNGS: Normal breath sounds bilaterally,decreased  bibasilar rales heard.No use of accessory muscles of respiration.  CARDIOVASCULAR: S1, S2 normal. No murmurs, rubs, or gallops.  ABDOMEN: Soft, nontender, nondistended. Bowel sounds present. No organomegaly or mass.  EXTREMITIES: No cyanosis, clubbing or edema b/l.    NEUROLOGIC: Awake and alert and responds to all verbal commands Moves all extremities PSYCHIATRIC: Oriented to time place and person SKIN: No obvious rash, lesion, or ulcer.   LABORATORY PANEL:    CBC Recent Labs  Lab 08/09/18 0307  WBC 15.0*  HGB 11.6*  HCT 34.1*  PLT 354   ------------------------------------------------------------------------------------------------------------------ Chemistries  Recent Labs  Lab 08/05/18 1355  08/09/18 0307  NA 137   < > 144  K 3.7   < > 3.1*  CL 104   < > 110  CO2 19*   < > 27  GLUCOSE 405*   < > 171*  BUN 14   < > 28*  CREATININE 1.16*   < > 0.99  CALCIUM 9.0   < > 8.5*  MG  --    < > 1.9  AST 43*  --   --   ALT 22  --   --   ALKPHOS 64  --   --   BILITOT 0.7  --   --    < > = values in this interval not displayed.   ------------------------------------------------------------------------------------------------------------------  Cardiac Enzymes Recent Labs  Lab 08/09/18 0306  TROPONINI 1.00*   ------------------------------------------------------------------------------------------------------------------  RADIOLOGY:  Dg Abd 1 View  Result Date: 08/07/2018 CLINICAL DATA:  NG tube placement EXAM: ABDOMEN - 1 VIEW COMPARISON:  08/07/2018 FINDINGS: NG tube has been advanced with the tip in the distal stomach. Nonobstructive bowel gas pattern. IMPRESSION: NG tube tip in the distal stomach. Electronically Signed   By: Rolm Baptise M.D.   On: 08/07/2018 19:01   Dg Abd 1 View  Result Date: 08/07/2018 CLINICAL DATA:  NG tube placement. EXAM: ABDOMEN - 1 VIEW COMPARISON:  Radiograph dated 08/05/2018 FINDINGS: The tip of the NG tube is in the distal esophagus just proximal to the gastroesophageal junction. The visualized bowel gas  pattern is normal. Diffuse accentuation of the interstitial markings of both lungs with Kerley B-lines suggesting interstitial edema are noted. IMPRESSION: NG tube tip at the gastroesophageal junction. Interstitial pulmonary edema, unchanged. Electronically Signed   By: Lorriane Shire M.D.   On: 08/07/2018 14:59   Dg Chest Port 1 View  Result Date: 08/09/2018 CLINICAL DATA:  Respiratory failure.  EXAM: PORTABLE CHEST 1 VIEW COMPARISON:  Radiograph of August 08, 2018. FINDINGS: Stable cardiomediastinal silhouette with central pulmonary vascular congestion is noted. Atherosclerosis of thoracic aorta is noted. Endotracheal tube has been removed. Nasogastric tube is seen entering the stomach. Right-sided PICC line is unchanged in position. No pneumothorax or pleural effusion is noted. Stable bilateral pulmonary edema is noted. Bony thorax is unremarkable. IMPRESSION: Stable central pulmonary vascular congestion is noted and bilateral pulmonary edema. Endotracheal tube has been removed. Aortic Atherosclerosis (ICD10-I70.0). Electronically Signed   By: Marijo Conception, M.D.   On: 08/09/2018 07:11   Portable Chest Xray  Result Date: 08/08/2018 CLINICAL DATA:  Intubation. EXAM: PORTABLE CHEST 1 VIEW COMPARISON:  08/07/2018. FINDINGS: NG tube has been uncoiled and repositioned. NG tube tip below left hemidiaphragm. NG tube, right PICC line in stable position. Mild cardiomegaly with bilateral interstitial prominence. Small right-sided pleural effusion. Findings suggest mild CHF. Pneumonitis cannot be excluded. No pneumothorax. IMPRESSION: 1. NG tube has been uncoiled and repositioned. Its tip is below left hemidiaphragm. Endotracheal tube and right PICC line stable position. 2 cardiomegaly with bilateral interstitial prominence and small right pleural effusions suggesting mild CHF. Pneumonitis cannot be excluded. Electronically Signed   By: Marcello Moores  Register   On: 08/08/2018 06:50   Dg Chest Port 1 View  Result Date: 08/07/2018 CLINICAL DATA:  PICC line placement EXAM: PORTABLE CHEST 1 VIEW COMPARISON:  08/07/2018 FINDINGS: 1759 hours. Diffuse pulmonary edema pattern persists in the lungs. Small bilateral pleural effusions evident, right greater than left. Cardiopericardial silhouette is at upper limits of normal for size. Endotracheal tube tip is approximately 2 cm above the base of the carina. Right PICC line  tip is positioned over the distal SVC. NG tube tip is in the proximal stomach with proximal port of the NG tube above the EG junction. NG tube remains coiled in the hypopharynx and proximal esophagus. IMPRESSION: 1. NG tube remains coiled in the hypopharynx and proximal esophagus. Tube should be withdrawn and repositioned for appropriate placement. 2. Right PICC line tip overlies the distal SVC. 3. Stable pulmonary edema pattern with small bilateral pleural effusions. These results will be called to the ordering clinician or representative by the Radiologist Assistant, and communication documented in the PACS or zVision Dashboard. Electronically Signed   By: Misty Stanley M.D.   On: 08/07/2018 18:21   Portable Chest X-ray  Result Date: 08/07/2018 CLINICAL DATA:  Endotracheal tube placement. NG tube placement. Pulmonary edema. EXAM: PORTABLE CHEST 1 VIEW 2:28 p.m. COMPARISON:  Chest x-ray dated 08/07/2018 at 1:36 p.m. and chest x-rays dated 08/06/2018 and 08/05/2018 FINDINGS: Endotracheal tube tip is 5 cm above the carina. NG tube tip is at the gastroesophageal junction and needs to be advanced. NG tube is looped in the cervical esophagus. Bilateral interstitial pulmonary edema has improved. Small right pleural effusion. Heart size is normal. Slight pulmonary vascular prominence. Aortic atherosclerosis. No acute bone abnormality. IMPRESSION: 1. Endotracheal tube in good position. 2. NG tube tip is at the gastroesophageal junction. The NG tube is looped in the cervical esophagus. 3. Improved bilateral interstitial pulmonary edema. Small residual right effusion. No  appreciable left effusion. 4.  Aortic Atherosclerosis (ICD10-I70.0). Electronically Signed   By: Lorriane Shire M.D.   On: 08/07/2018 15:01   Dg Chest Port 1 View  Result Date: 08/07/2018 CLINICAL DATA:  77 year old female with respiratory failure, pulmonary edema and/or pneumonia on recent chest CTA. EXAM: PORTABLE CHEST 1 VIEW COMPARISON:  0527  hours today and earlier. FINDINGS: Portable AP semi upright view at 1336 hours. Cardiac and mediastinal contours remain normal. Calcified aortic atherosclerosis. Visualized tracheal air column is within normal limits. The patient has been extubated and the enteric tube removed. Acutely increased diffuse pulmonary interstitial opacity, although lung volumes are stable to mildly larger. Small bilateral pleural effusions suspected. No pneumothorax. The confluent lower lung airspace disease may persist in the form of retrocardiac opacity. Paucity of upper abdominal bowel gas. No acute osseous abnormality identified. IMPRESSION: 1. Extubated and enteric tube removed. 2. New bilateral pulmonary interstitial opacity favored to reflect acute pulmonary edema. 3. Confluent bilateral lower lobe airspace disease seen on 08/10 19 May persist. Suspected small bilateral pleural effusions Electronically Signed   By: Genevie Ann M.D.   On: 08/07/2018 14:03   [place] Ekg Site Rite  Result Date: 08/07/2018 If Site Rite image not attached, placement could not be confirmed due to current cardiac rhythm.    ASSESSMENT AND PLAN:  77 year old female patient currently in the ICU for respiratory failure on ventilator  -S/P hypoxic respiratory failure with hypercapnia Off mechanical ventilator Extubated yesterday Continue monitoring oxygen saturations Is on via nasal cannula Transferred to medical floor today  -Acute COPD exacerbationimproving Nebulization treatments   -Aspiration pneumonitis versus flash pulmonary edema Off abx CTA chest no pulmonary embolism Mediastinal and hilar adenopathy probably reactive in etiology  -Cholelithiasis No evidence of any inflammation  -Status post gastroenterology evaluation Okay to restart tube feeds Continue IV PPI for Protonix 40 mg  daily Monitor hemoglobin Endoscopy deferred for now  -Type 2 diabetes mellitus Glycemic control strictly  -Reactive thrombocytosis  improved  All the records are reviewed and case discussed with Care Management/Social Worker. Management plans discussed with the patient, family and they are in agreement.  CODE STATUS: Full code  DVT Prophylaxis: SCDs  TOTAL TIME TAKING CARE OF THIS PATIENT: 33 minutes.   POSSIBLE D/C IN 3 to 4 DAYS, DEPENDING ON CLINICAL CONDITION.  Saundra Shelling M.D on 08/09/2018 at 12:29 PM  Between 7am to 6pm - Pager - [phone]  After 6pm go to [email or web address] - password [email or web address] [facility]  Office  [phone]  CC: Primary care physician; System, Pcp Not In  Note: This dictation was prepared with Dragon dictation along with smaller phrase technology. Any transcriptional errors that result from this process are unintentional.

## 2018-08-09 NOTE — Progress Notes (Signed)
Name: Brittney [name] MRN: [id number] DOB: 29-Aug-1941     CONSULTATION DATE: 08/05/2018  Subjective & Objectives: Pt sleeping, arouses to voice Afebrile On Room air Denies chest pain or SOB NGT removed Denies N/V, tolerating diet   PAST MEDICAL HISTORY :   has a past medical history of Diabetes mellitus without complication ([place]), GERD (gastroesophageal reflux disease), Hyperlipidemia, Hypertension, and Vitamin D deficiency.  has a past surgical history that includes Colonoscopy. Prior to Admission medications   Medication Sig Start Date End Date Taking? Authorizing Provider  amLODipine (NORVASC) 10 MG tablet Take 10 mg by mouth daily.   Yes [provider]  aspirin 81 MG tablet Take 81 mg by mouth daily.   Yes [provider]  atorvastatin (LIPITOR) 40 MG tablet Take 1 tablet by mouth daily. 07/04/18  Yes [provider]  cetirizine (ZYRTEC) 10 MG tablet Take 10 mg by mouth daily.    Yes [provider]  glipiZIDE (GLUCOTROL XL) 10 MG 24 hr tablet Take 10 mg by mouth 2 (two) times daily.   Yes [provider]  Liraglutide (VICTOZA) 18 MG/3ML SOPN Inject 1.8 mg into the skin every morning.   Yes [provider]  losartan (COZAAR) 100 MG tablet Take 100 mg by mouth daily.   Yes [provider]  metFORMIN (GLUCOPHAGE) 1000 MG tablet Take 1,000 mg by mouth 2 (two) times daily with a meal.   Yes [provider]  omeprazole (PRILOSEC) 20 MG capsule Take 20 mg by mouth daily.   Yes [provider]  ranitidine (ZANTAC) 150 MG tablet Take 1 tablet by mouth 2 (two) times daily. 05/06/18  Yes [provider]  sitaGLIPtin (JANUVIA) 100 MG tablet Take 100 mg by mouth daily.   Yes [provider]  albuterol (PROVENTIL HFA;VENTOLIN HFA) 108 (90 BASE) MCG/ACT inhaler Inhale 2 puffs into the lungs every 6 (six) hours as needed for wheezing or shortness of breath.     [provider]    Allergies  Allergen Reactions  . Accupril [Quinapril Hcl]     FAMILY HISTORY:  family history is not on file. SOCIAL HISTORY:  reports that she has been smoking cigarettes. She has a 40.00 pack-year smoking history. She has never used smokeless tobacco. She reports that she does not drink alcohol or use drugs.  REVIEW OF SYSTEMS:   Positives in BOLD:   Pt denies any complaints Gen: Denies fever, chills, weight change, fatigue, night sweats HEENT: Denies blurred vision, double vision, hearing loss, tinnitus, sinus congestion, rhinorrhea, sore throat, neck stiffness, dysphagia PULM: Denies shortness of breath, +cough, sputum production, hemoptysis, wheezing CV: Denies chest pain, edema, orthopnea, paroxysmal nocturnal dyspnea, palpitations GI: Denies abdominal pain, +nausea, +vomiting, diarrhea, hematochezia, melena, constipation, change in bowel habits GU: Denies dysuria, hematuria, polyuria, oliguria, urethral discharge Endocrine: Denies hot or cold intolerance, polyuria, polyphagia or appetite change Derm: Denies rash, dry skin, scaling or peeling skin change Heme: Denies easy bruising, bleeding, bleeding gums Neuro: Denies headache, numbness, weakness, slurred speech, loss of memory or consciousness    VITAL SIGNS: Temp:  [98.8 F (37.1 C)-99.9 F (37.7 C)] 99.1 F (37.3 C) (08/14 1100) Pulse Rate:  [90-123] 114 (08/14 1100) Resp:  [19-29] 19 (08/14 1100) BP: (106-186)/(52-124) 165/70 (08/14 1100) SpO2:  [93 %-99 %] 97 % (08/14 1100)  Physical Examination:  GENERAL: Chronically ill appearing female, sleeping in bed, on room air, in NAD NEURO: Sleeping, arouses to voice, Oriented to person, place, and time.  Follows commands, no focal deficits  HEENT: Atraumatic, normocephalic, neck supple, no JVD  CARDIAC: Tachycardia, regular rhythm, s1s2, no M/R/G, 2+ pulses throughout PULMONARY: Clear breath sounds bilaterally, even, symmetrical, normal effort  GI: Soft, slightly  tender, non-distended, BS+ x4  MUSCULOSKELETAL: No deformities, active ROM all extremities  SKIN: Warm, dry.  No obvious rashes, lesions, or ulcerations   ASSESSMENT / PLAN:  PULMONARY A: Acute respiratory failure with hypoxemia and hypercapnia. Intubated on 08/05/2018.>>Extubated 08/07/18>>reintubated 8/12>>extubated 8/13 -CT Chest negative PE AECOPD Pneumonia (Aspiration) P: Supplemental O2 prn to maintain O2 sats 88 to 92% Continue Bronchodilators Completed course of Zosyn (completed 8/14) Sputum culture consistent with normal flora Follow intermittent CXR    CARDIAC A: Septic shock>>resolved Dyslipidemia. Elevated troponin, ?NSTEMI vs Demand ischemia  -EKG with T wave inversion and slight ST depression in anteriolateral leads -Troponin 1.75> 1.58> 1> 1 CXR 8/14 with mild CHF -BNP 1069 HTN P: Cardiac monitoring Maintain MAP>65 Troponin trending down Cardiology consulted, appreciate input Continue statin Continue Lovenox  Received 40 mg Lasix 8/14 D/c D5W infusion Prn Hydralazine for SBP >160 Resume home Norvasc    INFECTIOUS A: Leukocytosis in setting of UTI & PNA -Urine culture growing E.coli>>pan sensitive -sputum culture>>normal flora P: Monitor fever curve Trend WBC & PCT Follow cultures Course Zosyn completed 8/14   RENAL A: AKI>>improving>>resolved -Renal US>> No  Obstructive uropathy Non-anion gap metabolic acidosis>>resolved Hypokalemia P: Monitor I&O's / urinary output Follow BMP Ensure adequate renal perfusion Avoid nephrotoxic agents as able Replace electrolytes as indicated Potassium repleted IV 8/14, recheck BMP in am 8/15   GI A: GI bleed with bloody aspirate via OGT Cholelithiasis with no signs of acute cholecystitis N/V Hx: GERD P: Diet, advance as tolerated Continue Protonix GI signed off>> per GI low likelihood of GI bleed Prn Zofran   ENDOCRINE A: Diabetes Mellitus P: CBG's SSI   HEME A: Anemia    Reactive thrombocytosis>>resolved P: Monitor for s/sx of bleeding Trend CBC Continue Lovenox for VTE prophylaxis Transfuse for Hgb<8   Disposition: Transfer to med/surg unit with off unit telemetry monitoring Family: No family present at bedside during NP rounds 08/09/18 Goals of care: Full Code   Darel Hong, AGACNP-BC [facility] Pager: [phone]

## 2018-08-10 ENCOUNTER — Inpatient Hospital Stay: Payer: Medicare HMO

## 2018-08-10 LAB — BASIC METABOLIC PANEL
Anion gap: 10 (ref 5–15)
BUN: 26 mg/dL — ABNORMAL HIGH (ref 8–23)
CO2: 25 mmol/L (ref 22–32)
Calcium: 8.5 mg/dL — ABNORMAL LOW (ref 8.9–10.3)
Chloride: 108 mmol/L (ref 98–111)
Creatinine, Ser: 0.9 mg/dL (ref 0.44–1.00)
GFR calc Af Amer: >60 mL/min (ref >60)
GFR calc non Af Amer: >60 mL/min (ref >60)
Glucose, Bld: 248 mg/dL — ABNORMAL HIGH (ref 70–99)
Potassium: 3.5 mmol/L (ref 3.5–5.1)
Sodium: 143 mmol/L (ref 135–145)

## 2018-08-10 LAB — CULTURE, BLOOD (ROUTINE X 2)
Culture: NO GROWTH
Culture: NO GROWTH
Special Requests: ADEQUATE

## 2018-08-10 LAB — CBC
HCT: 34.3 % — ABNORMAL LOW (ref 35.0–47.0)
Hemoglobin: 11.6 g/dL — ABNORMAL LOW (ref 12.0–16.0)
MCH: 27.5 pg (ref 26.0–34.0)
MCHC: 33.7 g/dL (ref 32.0–36.0)
MCV: 81.5 fL (ref 80.0–100.0)
Platelets: 332 10*3/uL (ref 150–440)
RBC: 4.21 MIL/uL (ref 3.80–5.20)
RDW: 15.2 % — ABNORMAL HIGH (ref 11.5–14.5)
WBC: 13.4 10*3/uL — ABNORMAL HIGH (ref 3.6–11.0)

## 2018-08-10 LAB — CULTURE, RESPIRATORY: Culture: NORMAL

## 2018-08-10 LAB — GLUCOSE, CAPILLARY
Glucose-Capillary: 263 mg/dL — ABNORMAL HIGH (ref 70–99)
Glucose-Capillary: 295 mg/dL — ABNORMAL HIGH (ref 70–99)
Glucose-Capillary: 288 mg/dL — ABNORMAL HIGH (ref 70–99)
Glucose-Capillary: 290 mg/dL — ABNORMAL HIGH (ref 70–99)

## 2018-08-10 LAB — PROCALCITONIN: Procalcitonin: 0.15 ng/mL

## 2018-08-10 LAB — PHOSPHORUS: Phosphorus: 2.6 mg/dL (ref 2.5–4.6)

## 2018-08-10 LAB — MAGNESIUM: Magnesium: 1.9 mg/dL (ref 1.7–2.4)

## 2018-08-10 MED ORDER — METOPROLOL TARTRATE 50 MG PO TABS
50.0000 mg | ORAL_TABLET | Freq: Two times a day (BID) | ORAL | Status: DC
  Administered 2018-08-10 – 2018-08-11 (×2): 50 mg via ORAL
  Filled 2018-08-10 (×2): qty 1

## 2018-08-10 MED ORDER — POTASSIUM CHLORIDE CRYS ER 20 MEQ PO TBCR
40.0000 meq | EXTENDED_RELEASE_TABLET | Freq: Once | ORAL | Status: AC
  Administered 2018-08-10: 40 meq via ORAL
  Filled 2018-08-10: qty 2

## 2018-08-10 MED ORDER — GLIPIZIDE ER 10 MG PO TB24
10.0000 mg | ORAL_TABLET | Freq: Every day | ORAL | Status: DC
  Administered 2018-08-11: 10 mg via ORAL
  Filled 2018-08-10 (×2): qty 1

## 2018-08-10 MED ORDER — PREMIER PROTEIN SHAKE
11.0000 [oz_av] | Freq: Two times a day (BID) | ORAL | Status: DC
  Administered 2018-08-11: 11 [oz_av] via ORAL

## 2018-08-10 MED ORDER — METFORMIN HCL 500 MG PO TABS
500.0000 mg | ORAL_TABLET | Freq: Two times a day (BID) | ORAL | Status: DC
  Administered 2018-08-10 – 2018-08-11 (×2): 500 mg via ORAL
  Filled 2018-08-10 (×3): qty 1

## 2018-08-10 MED ORDER — PANTOPRAZOLE SODIUM 40 MG PO TBEC
40.0000 mg | DELAYED_RELEASE_TABLET | Freq: Every day | ORAL | Status: DC
  Administered 2018-08-10: 40 mg via ORAL
  Filled 2018-08-10: qty 1

## 2018-08-10 MED ORDER — INSULIN GLARGINE 100 UNIT/ML ~~LOC~~ SOLN: 10.0000 [IU] | Freq: Every day | SUBCUTANEOUS | Status: DC

## 2018-08-10 NOTE — Care Management (Signed)
RNCM spoke with [facility] and arranged for follow up appointment. I also spoke with their pharmacist.  Record shows that patient received order on 06/29/18 for Ozempic 0.25 mg once weekly for 4 weeks. This was sent to [facility] to have filled.  RNCM will follow up with patient.

## 2018-08-10 NOTE — Evaluation (Signed)
Physical Therapy Evaluation Patient Details Name: Brittney [name] MRN: [id number] DOB: February 05, 1941 Today's Date: 08/10/2018   History of Present Illness  Brittney [name]  is a 77 y.o. female with a known history of diabetes, hypertension, hyperlipidemia, GERD, ongoing tobacco abuse, COPD who presents to the hospital due to shortness of breath and noted to be in sepsis secondary to pneumonia. Pt has been intubated twice during hospital stay and extubated both times. Currently she has been hemodynamically stable, weak and fatigued but no evidence of further worsening of symptoms  Clinical Impression  PT consulted nursing concerning patient BP and HR increase noted earlier this morning. RN confirmed pt was appropriate for therapy and stated pt condition was stable.  Pt was agreeable and eager to participate in therapy. Pt was A + O x 3 (person, place, situation--time was not asked by therapist). At PT arrival pt BP was 160/62, and HR 105 in supine. Pt moved  supine to sit CGA, and was able to tolerate sitting at EOB without a change in symptoms--vitals elevated slightly but WNL. Pt showed good gross BUE and BLE strength and ROM. Pt had sufficient sitting balance with BUE unsupported and could accept mod perturbations.  Pt was min assist sit to stand w/RW and again had no reported symptoms with standing. Pt standing balance is poor when BOS is decreased or BUE are removed from RW (further details below). Pt ambulated 60 ft w/RW min assist w/o pt reported symptoms (Gait details below).Pt HR elevated to peak of 129 during ambulation but returned to near baseline when pt transferred to sitting in chair (108). Pt SpO2 levels remained 95% or above during entire session and pt reported no SOB. According to pt, she lives with family in one story house and was ind w/o AD before acute hospitalization (home and function details below).Would benefit from skilled PT to address above deficits and promote optimal return to  PLOF. Recommend transition to [place] upon discharge from acute hospitalization pending continued improvements in medical status.     Follow Up Recommendations Home health PT    Equipment Recommendations  Rolling walker with 5" wheels    Recommendations for Other Services       Precautions / Restrictions Precautions Precautions: Fall Restrictions Weight Bearing Restrictions: No      Mobility  Bed Mobility Overal bed mobility: Needs Assistance Bed Mobility: Supine to Sit     Supine to sit: Modified independent (Device/Increase time);Min assist     General bed mobility comments: Pt moves to EOB with relative ease. Needs min assist scooting forward to put feet on floor.   Transfers Overall transfer level: Needs assistance Equipment used: Rolling walker (2 wheeled) Transfers: Sit to/from Stand Sit to Stand: Min guard         General transfer comment: Pt used BUE for push off and RW for stbaility to reach full standing position.   Ambulation/Gait Ambulation/Gait assistance: Min assist;+2 safety/equipment Gait Distance (Feet): 60 Feet Assistive device: Rolling walker (2 wheeled) Gait Pattern/deviations: Step-through pattern;Decreased stride length;Decreased weight shift to left;Decreased weight shift to right Gait velocity: decreased   General Gait Details: Appropriate foot width and hip flexion with no foot drag or shuffle. Pt stride length decreased secondary to decreased balance with weight shifting.   Stairs            Wheelchair Mobility    Modified Rankin (Stroke Patients Only)       Balance Overall balance assessment: Needs assistance Sitting-balance support: No upper extremity  supported;Feet supported Sitting balance-Leahy Scale: Good Sitting balance - Comments: Pt able to tolerate mod perturbations in all directions. No evidence of sway or lean with sustained sitting w/o BUE support.      Standing balance-Leahy Scale: Fair Standing balance  comment: Pt loses balance with weight shifting when BUE support is removed.          Rhomberg - Eyes Opened: 12                   Pertinent Vitals/Pain Pain Assessment: No/denies pain    Home Living Family/patient expects to be discharged to:: Private residence Living Arrangements: Children Available Help at Discharge: Family Type of Home: House Home Access: Stairs to enter Entrance Stairs-Rails: None Entrance Stairs-Number of Steps: 4 Home Layout: One level Home Equipment: None Additional Comments: Will follow up with pt family clarifying home accessibility    Prior Function Level of Independence: Independent         Comments: per pt report     Hand Dominance        Extremity/Trunk Assessment   Upper Extremity Assessment Upper Extremity Assessment: Overall WFL for tasks assessed    Lower Extremity Assessment Lower Extremity Assessment: Overall WFL for tasks assessed    Cervical / Trunk Assessment Cervical / Trunk Assessment: Kyphotic  Communication   Communication: No difficulties  Cognition Arousal/Alertness: Awake/alert Behavior During Therapy: WFL for tasks assessed/performed Overall Cognitive Status: Within Functional Limits for tasks assessed                                        General Comments      Exercises Other Exercises Other Exercises: bed mobility: Supine to sit min assist; transfers; Sit to stand w/RW CGA; ambulation: 60 ft w/RW min assist Other Exercises: AROM/MMT: Shoulder flex, hip flex, knee ext   Assessment/Plan    PT Assessment Patient needs continued PT services  PT Problem List Decreased strength;Decreased balance;Decreased activity tolerance;Decreased mobility;Cardiopulmonary status limiting activity       PT Treatment Interventions DME instruction;Stair training;Therapeutic exercise;Gait training;Therapeutic activities;Functional mobility training;Balance training;Neuromuscular  re-education;Patient/family education    PT Goals (Current goals can be found in the Care Plan section)       Frequency Min 2X/week   Barriers to discharge Inaccessible home environment      Co-evaluation               AM-PAC PT "6 Clicks" Daily Activity  Outcome Measure Difficulty turning over in bed (including adjusting bedclothes, sheets and blankets)?: A Little Difficulty moving from lying on back to sitting on the side of the bed? : Unable Difficulty sitting down on and standing up from a chair with arms (e.g., wheelchair, bedside commode, etc,.)?: A Little Help needed moving to and from a bed to chair (including a wheelchair)?: A Little Help needed walking in hospital room?: A Little Help needed climbing 3-5 steps with a railing? : A Lot 6 Click Score: 15    End of Session Equipment Utilized During Treatment: Gait belt Activity Tolerance: Patient tolerated treatment well;Treatment limited secondary to medical complications (Elevated HR with activity) Patient left: in chair;with call bell/phone within reach;with SCD's reapplied(No chair alarm; RN notified) Nurse Communication: Mobility status, pt in chair with no chair alarm--RN notified and approved of no alarm.  PT Visit Diagnosis: Unsteadiness on feet (R26.81);Difficulty in walking, not elsewhere classified (R26.2)    Time:  7121-9758 PT Time Calculation (min) (ACUTE ONLY): 30 min   Charges:              Hortencia Conradi, SPT 08/10/18,11:51 AM

## 2018-08-10 NOTE — Progress Notes (Signed)
0345 patient awakens complaining of shortness of breath. Oxygen saturation 93%, Lungs are diminished but clear. 2L o2 added for comfort. Patients blood pressure is also elevated at 180/90. Hydralazinde 20mg  given IV and a breathing treatment ordered.0400 Patient asleep will continue to assess for changes/need.

## 2018-08-10 NOTE — Progress Notes (Signed)
Met with patient and her daughter Brittney [name]) today.  Pt gave me verbal permission to speak with her daughter Brittney [name].  Daughter told me that pt had a hard time getting her Victoza prescription filled in June and went without the medication for an entire month.  Not sure if it was due to cost or the pharmacy not having the medication in stock to fill??  Daughter and pt were unclear as to why the pharmacy did not fill the Victoza.  Confirmed with me that the Rx is indeed for Victoza injection daily.  Pt's daughter told me that pt was able to get her Victoza in July and that patient does have Victoza at home.  Did not express any concerns about getting the Victoza and did not have any questions for me about her mom's diabetes care.  Reviewed with pt's daughter that pt's current A1c of 7.3% shows good CBG control at home.  Pt's daughter told me her Mom (the patient) has not been having any Hypoglycemic events at home.    Would continue the following pre-admission DM meds at time of discharge: Home DM Meds:Januvia 100 mg daily Glipizide 10 mg BID Metformin 1000 mg BID Victoza 1.8 mg daily    --Will follow patient during hospitalization--  Wyn Quaker RN, MSN, CDE Diabetes Coordinator Inpatient Glycemic Control Team Team Pager: [phone] (8a-5p)

## 2018-08-10 NOTE — Progress Notes (Signed)
PHARMACIST - PHYSICIAN COMMUNICATION  DR:   Alva Garnet  CONCERNING: IV to Oral Route Change Policy  RECOMMENDATION: This patient is receiving pantoprazole by the intravenous route.  Based on criteria approved by the Pharmacy and Therapeutics Committee, the intravenous medication(s) is/are being converted to the equivalent oral dose form(s).   DESCRIPTION: These criteria include:  The patient is eating (either orally or via tube) and/or has been taking other orally administered medications for a least 24 hours  The patient has no evidence of active gastrointestinal bleeding or impaired GI absorption (gastrectomy, short bowel, patient on TNA or NPO).  If you have questions about this conversion, please contact the Pharmacy Department  []   [phone] )  [facility] [x]   [phone] )  [facility] []   [phone] )  [facility] []   [phone] )  [facility] []   [phone] )  [place] 08/10/2018 9:01 AM

## 2018-08-10 NOTE — Progress Notes (Signed)
[place] [facility] [place] NAME: Brittney [name]    MR#:  [id number]  DATE OF BIRTH:  10/28/1941  SUBJECTIVE:   Patient here due to shortness of breath and noted to be in acute on chronic respiratory failure with hypercapnia and was initially intubated.  Extubated today and doing well on room air.  Sitting up in chair and has no complaints.  REVIEW OF SYSTEMS:    Review of Systems  Constitutional: Negative for chills and fever.  HENT: Negative for congestion and tinnitus.   Eyes: Negative for blurred vision and double vision.  Respiratory: Negative for cough, shortness of breath and wheezing.   Cardiovascular: Negative for chest pain, orthopnea and PND.  Gastrointestinal: Negative for abdominal pain, diarrhea, nausea and vomiting.  Genitourinary: Negative for dysuria and hematuria.  Neurological: Negative for dizziness, sensory change and focal weakness.  All other systems reviewed and are negative.   Nutrition: Heart healthy/Carb modified Tolerating Diet: Yes Tolerating PT: Await Eval.   DRUG ALLERGIES:   Allergies  Allergen Reactions  . Accupril [Quinapril Hcl]     VITALS:  Blood pressure (!) 148/67, pulse 88, temperature 98 F (36.7 C), temperature source Axillary, resp. rate (!) 27, height 5\' 7"  (1.702 m), weight 75.5 kg, SpO2 98 %.  PHYSICAL EXAMINATION:   Physical Exam  GENERAL:  77 y.o.-year-old patient sitting in chair in no acute distress.  EYES: Pupils equal, round, reactive to light and accommodation. No scleral icterus. Extraocular muscles intact.  HEENT: Head atraumatic, normocephalic. Oropharynx and nasopharynx clear.  NECK:  Supple, no jugular venous distention. No thyroid enlargement, no tenderness.  LUNGS: Normal breath sounds bilaterally, no wheezing, rales, rhonchi. No use of accessory muscles of respiration.  CARDIOVASCULAR: S1, S2 normal. No murmurs, rubs, or gallops.  ABDOMEN: Soft, nontender, nondistended.  Bowel sounds present. No organomegaly or mass.  EXTREMITIES: No cyanosis, clubbing or edema b/l.    NEUROLOGIC: Cranial nerves II through XII are intact. No focal Motor or sensory deficits b/l.   PSYCHIATRIC: The patient is alert and oriented x 3.  SKIN: No obvious rash, lesion, or ulcer.    LABORATORY PANEL:   CBC Recent Labs  Lab 08/10/18 0634  WBC 13.4*  HGB 11.6*  HCT 34.3*  PLT 332   ------------------------------------------------------------------------------------------------------------------  Chemistries  Recent Labs  Lab 08/05/18 1355  08/10/18 0634  NA 137   < > 143  K 3.7   < > 3.5  CL 104   < > 108  CO2 19*   < > 25  GLUCOSE 405*   < > 248*  BUN 14   < > 26*  CREATININE 1.16*   < > 0.90  CALCIUM 9.0   < > 8.5*  MG  --    < > 1.9  AST 43*  --   --   ALT 22  --   --   ALKPHOS 64  --   --   BILITOT 0.7  --   --    < > = values in this interval not displayed.   ------------------------------------------------------------------------------------------------------------------  Cardiac Enzymes Recent Labs  Lab 08/09/18 0306  TROPONINI 1.00*   ------------------------------------------------------------------------------------------------------------------  RADIOLOGY:  Dg Chest Port 1 View  Result Date: 08/10/2018 CLINICAL DATA:  Respiratory failure EXAM: PORTABLE CHEST 1 VIEW COMPARISON:  August 09, 2018 FINDINGS: Stable right PICC line. The NG tube is been removed. Cardiomediastinal silhouette is stable. Diffuse interstitial opacities have improved in the interval. Possible tiny right effusion.  No other interval change. IMPRESSION: 1. Support apparatus as above. 2. Mild but improving edema.  Probable tiny right effusion. Electronically Signed   By: Dorise Bullion III M.D   On: 08/10/2018 07:31   Dg Chest Port 1 View  Result Date: 08/09/2018 CLINICAL DATA:  Respiratory failure. EXAM: PORTABLE CHEST 1 VIEW COMPARISON:  Radiograph of August 08, 2018.  FINDINGS: Stable cardiomediastinal silhouette with central pulmonary vascular congestion is noted. Atherosclerosis of thoracic aorta is noted. Endotracheal tube has been removed. Nasogastric tube is seen entering the stomach. Right-sided PICC line is unchanged in position. No pneumothorax or pleural effusion is noted. Stable bilateral pulmonary edema is noted. Bony thorax is unremarkable. IMPRESSION: Stable central pulmonary vascular congestion is noted and bilateral pulmonary edema. Endotracheal tube has been removed. Aortic Atherosclerosis (ICD10-I70.0). Electronically Signed   By: Marijo Conception, M.D.   On: 08/09/2018 07:11     ASSESSMENT AND PLAN:   77 year old female with past medical history of COPD with ongoing tobacco abuse, diabetes, hypertension, hyperlipidemia, GERD who presents to the hospital due to shortness of breath and noted to be in acute on chronic respiratory failure with hypoxia and hypercapnia and also sepsis.  1.  Acute on chronic respiratory failure with hypoxia and hypercapnia- secondary to COPD exacerbation. -Patient was initially on BiPAP and did not do well and then was intubated.  Now extubated and doing well on room air. - Continue duo nebs as needed, patient off antibiotics and IV steroids now.  2.  COPD exacerbation-source of patient's worsening respiratory failure much improved now. - Continue duo nebs as needed.  3.  Aspiration pneumonitis/flash pulmonary edema. - Patient was extubated and reintubated and thought to have aspiration pneumonitis but x-ray findings were also suggestive of pulmonary edema.  CTA chest was negative for pulmonary embolism.  Patient now extubated and doing well.  No clinical evidence of aspiration pneumonia, off antibiotics.  Follow clinically.  4.  Diabetes type 2 without complication-continue Metformin, Glipizide, sliding scale insulin. - follow BS  5.  CHF- acute on chronic diastolic CHF.  Much improved now.  Continue Metoprolol.   - will need to discuss with Cardiology about discharging her on some diuretics.   6. GERD- cont. Protonix.   7. Hyperlipidemia - cont. Atorvastatin.   Transfer to floor today. Await PT eval.   All the records are reviewed and case discussed with Care Management/Social Worker. Management plans discussed with the patient, family and they are in agreement.  CODE STATUS: Full code  DVT Prophylaxis: Lovenox  TOTAL TIME TAKING CARE OF THIS PATIENT: 30 minutes.   POSSIBLE D/C IN 1-2 DAYS, DEPENDING ON CLINICAL CONDITION.   [name] M.D on 08/10/2018 at 3:10 PM  Between 7am to 6pm - Pager - [phone]  After 6pm go to [email or web address] - Proofreader  [facility]  Office  [phone]  CC: Primary care physician; System, Pcp Not In

## 2018-08-10 NOTE — Progress Notes (Signed)
Pt resting in bed watching TV with no acute distress. Pt denies pain or discomfort.

## 2018-08-10 NOTE — Progress Notes (Signed)
Patient has had transfer orders since yesterday.  No ongoing ICU/SDU needs.  PCCM service is available as needed.  After transfer, PCCM will sign off. Please call if we can be of further assistance    Merton Border, MD PCCM service Mobile [phone] Pager [phone] 08/10/2018 4:01 PM

## 2018-08-10 NOTE — Care Management Note (Signed)
Case Management Note  Patient Details  Name: Brittney [name] MRN: [id number] Date of Birth: 1941/07/19  Subjective/Objective:                  RNCM met with patient and a daughterGlenard [name].  Patient lives with another daughter Brittney [name].  She states she is independent from home. She is aware of PCP follow up appointment. She uses [facility] for medications.  She does have trouble affording her insulin. She agrees to change in regimen and to meet her financial needs.  She depends on family for transportation.  Action/Plan:    DM RN consult placed for medication assistance.   Expected Discharge Date:                  Expected Discharge Plan:     In-House Referral:  PCP / [facility]  Discharge planning Services  CM Consult, Medication Assistance  Post Acute Care Choice:    Choice offered to:  Patient, Adult Children  DME Arranged:    DME Agency:     HH Arranged:    [place] Agency:     Status of Service:  In process, will continue to follow  If discussed at Long Length of Stay Meetings, dates discussed:    Additional Comments:  Marshell Garfinkel, RN 08/10/2018, 12:20 PM

## 2018-08-10 NOTE — Progress Notes (Signed)
Pt c/o feeling short of breath. 02 sat 98% on room air. No change in lung sounds. RT in to evaluate and administer breathing treatment. Will continue to monitor.

## 2018-08-10 NOTE — Progress Notes (Signed)
[facility] Encounter Note  Patient: Brittney [name] / Admit Date: 08/05/2018 / Date of Encounter: 08/10/2018, 8:26 AM   Subjective: The patient is significantly improved with extubation and less shortness of breath cough and congestion.  Patient had peak troponin of 0.88 most consistent with demand ischemia rather than acute coronary syndrome.  No evidence of congestive heart failure type symptoms and/or angina at this time  Review of Systems: Positive for: Shortness of breath Negative for: Vision change, hearing change, syncope, dizziness, nausea, vomiting,diarrhea, bloody stool, stomach pain, cough, congestion, diaphoresis, urinary frequency, urinary pain,skin lesions, skin rashes Others previously listed  Objective: Telemetry: Sinus tachycardia Physical Exam: Blood pressure (!) 159/74, pulse (!) 113, temperature 98 F (36.7 C), temperature source Axillary, resp. rate (!) 25, height 5\' 7"  (1.702 m), weight 75.5 kg, SpO2 99 %. Body mass index is 26.07 kg/m. General: Well developed, well nourished, in no acute distress. Head: Normocephalic, atraumatic, sclera non-icteric, no xanthomas, nares are without discharge. Neck: No apparent masses Lungs: Normal respirations with few wheezes, some rhonchi, no rales , no crackles   Heart: Regular rate and rhythm, normal S1 S2, no murmur, no rub, no gallop, PMI is normal size and placement, carotid upstroke normal without bruit, jugular venous pressure normal Abdomen: Soft, non-tender, non-distended with normoactive bowel sounds. No hepatosplenomegaly. Abdominal aorta is normal size without bruit Extremities: Trace edema, no clubbing, no cyanosis, no ulcers,  Peripheral: 2+ radial, 2+ femoral, 2+ dorsal pedal pulses Neuro: Alert and oriented. Moves all extremities spontaneously. Psych:  Responds to questions appropriately with a normal affect.   Intake/Output Summary (Last 24 hours) at 08/10/2018 0826 Last data filed at  08/10/2018 0600 Gross per 24 hour  Intake 849.17 ml  Output 2555 ml  Net -1705.83 ml    Inpatient Medications:  . amLODipine  10 mg Oral Daily  . aspirin  325 mg Oral Daily  . atorvastatin  40 mg Oral Daily  . enoxaparin (LOVENOX) injection  40 mg Subcutaneous Q24H  . insulin aspart  0-15 Units Subcutaneous TID WC  . insulin aspart  0-5 Units Subcutaneous QHS  . metoprolol tartrate  25 mg Oral BID  . pantoprazole  40 mg Intravenous QHS   Infusions:   Labs: Recent Labs    08/09/18 0307 08/10/18 0634  NA 144 143  K 3.1* 3.5  CL 110 108  CO2 27 25  GLUCOSE 171* 248*  BUN 28* 26*  CREATININE 0.99 0.90  CALCIUM 8.5* 8.5*  MG 1.9 1.9  PHOS  --  2.6   No results for input(s): AST, ALT, ALKPHOS, BILITOT, PROT, ALBUMIN in the last 72 hours. Recent Labs    08/09/18 0307 08/10/18 0634  WBC 15.0* 13.4*  HGB 11.6* 11.6*  HCT 34.1* 34.3*  MCV 80.8 81.5  PLT 354 332   Recent Labs    08/08/18 1118 08/08/18 1734 08/08/18 2106 08/09/18 0306  TROPONINI 1.75* 1.58* 1.01* 1.00*   Invalid input(s): POCBNP No results for input(s): HGBA1C in the last 72 hours.   Weights: Filed Weights   08/05/18 1802 08/06/18 2300 08/07/18 0411  Weight: 83 kg 75.5 kg 75.5 kg     Radiology/Studies:  Dg Abd 1 View  Result Date: 08/07/2018 CLINICAL DATA:  NG tube placement EXAM: ABDOMEN - 1 VIEW COMPARISON:  08/07/2018 FINDINGS: NG tube has been advanced with the tip in the distal stomach. Nonobstructive bowel gas pattern. IMPRESSION: NG tube tip in the distal stomach. Electronically Signed   By: Lennette Bihari  [place] M.D.   On: 08/07/2018 19:01   Dg Abd 1 View  Result Date: 08/07/2018 CLINICAL DATA:  NG tube placement. EXAM: ABDOMEN - 1 VIEW COMPARISON:  Radiograph dated 08/05/2018 FINDINGS: The tip of the NG tube is in the distal esophagus just proximal to the gastroesophageal junction. The visualized bowel gas pattern is normal. Diffuse accentuation of the interstitial markings of both lungs with  Kerley B-lines suggesting interstitial edema are noted. IMPRESSION: NG tube tip at the gastroesophageal junction. Interstitial pulmonary edema, unchanged. Electronically Signed   By: Lorriane Shire M.D.   On: 08/07/2018 14:59   Dg Abd 1 View  Result Date: 08/05/2018 CLINICAL DATA:  Evaluate NG tube EXAM: ABDOMEN - 1 VIEW COMPARISON:  August 05, 2018 FINDINGS: The distal tip of the NG tube is in the gastric fundus. The side port is near the GE junction. IMPRESSION: The side port of the NG tube is near the GE junction. Consider advancement. Electronically Signed   By: Dorise Bullion III M.D   On: 08/05/2018 20:41   Ct Angio Chest Pe W And/or Wo Contrast  Result Date: 08/05/2018 CLINICAL DATA:  Respiratory distress, diaphoresis and lethargy. Intubated. Acute, generalized abdominal pain. EXAM: CT ANGIOGRAPHY CHEST CT ABDOMEN AND PELVIS WITH CONTRAST TECHNIQUE: Multidetector CT imaging of the chest was performed using the standard protocol during bolus administration of intravenous contrast. Multiplanar CT image reconstructions and MIPs were obtained to evaluate the vascular anatomy. Multidetector CT imaging of the abdomen and pelvis was performed using the standard protocol during bolus administration of intravenous contrast. CONTRAST:  98mL ISOVUE-370 IOPAMIDOL (ISOVUE-370) INJECTION 76% COMPARISON:  None. FINDINGS: CTA CHEST FINDINGS Cardiovascular: Normally opacified pulmonary arteries with no pulmonary arterial filling defects seen. Atheromatous calcifications, including the coronary arteries and aorta. Borderline enlarged heart. No pericardial effusion. Mediastinum/Nodes: Mildly prominent precarinal node with a short axis diameter of 10 mm on image number 40 series 4. Mildly enlarged right hilar nodes, the largest with a short axis diameter of 11 mm on image number 54 series 4. Enlarged left hilar nodes, the largest with a short axis diameter of 10 mm on image number 52 series 4. Endotracheal 2 tip 2.7 cm  above the carina. Nasogastric tube extending into the stomach. Lungs/Pleura: Dense, patchy and confluent airspace opacity in both lower lobes. Diffusely prominent interstitial markings with septal edema. Small bilateral pleural effusions. Musculoskeletal: Thoracic and lower cervical spine degenerative changes. Review of the MIP images confirms the above findings. CT ABDOMEN and PELVIS FINDINGS Hepatobiliary: Multiple gallstones in the gallbladder measuring up to 2.0 cm in maximum diameter each. No gallbladder wall thickening or pericholecystic fluid. Normal appearing liver. Pancreas: Unremarkable. No pancreatic ductal dilatation or surrounding inflammatory changes. Spleen: Normal in size without focal abnormality. Adrenals/Urinary Tract: Small bilateral renal cysts. Normal appearing adrenal glands and ureters. Foley catheter in the urinary bladder with associated air in the bladder. Stomach/Bowel: Single distal descending colon diverticulum. Nasogastric tube tip in the mid to proximal stomach. Unremarkable small bowel. No evidence of appendicitis. Vascular/Lymphatic: Atheromatous arterial calcifications without aneurysm. No enlarged lymph nodes. Reproductive: Normal sized uterus with multiple small peripheral calcifications. No adnexal mass. Other: No abdominal wall hernia or abnormality. No abdominopelvic ascites. Musculoskeletal: Lumbar and lower thoracic spine degenerative changes. Review of the MIP images confirms the above findings. IMPRESSION: 1. No pulmonary emboli. 2. Dense, patchy and confluent airspace opacity in both lower lobes. This is most likely due to pneumonia. Patchy atelectasis is less likely. 3. Borderline cardiomegaly with changes of acute  congestive heart failure. 4. Mild mediastinal and bilateral hilar adenopathy, most likely reactive. 5. Cholelithiasis without evidence cholecystitis. 6.  Calcific coronary artery and aortic atherosclerosis. Aortic Atherosclerosis (ICD10-I70.0). Electronically  Signed   By: Claudie Revering M.D.   On: 08/05/2018 15:40   Ct Abdomen Pelvis W Contrast  Result Date: 08/05/2018 CLINICAL DATA:  Respiratory distress, diaphoresis and lethargy. Intubated. Acute, generalized abdominal pain. EXAM: CT ANGIOGRAPHY CHEST CT ABDOMEN AND PELVIS WITH CONTRAST TECHNIQUE: Multidetector CT imaging of the chest was performed using the standard protocol during bolus administration of intravenous contrast. Multiplanar CT image reconstructions and MIPs were obtained to evaluate the vascular anatomy. Multidetector CT imaging of the abdomen and pelvis was performed using the standard protocol during bolus administration of intravenous contrast. CONTRAST:  39mL ISOVUE-370 IOPAMIDOL (ISOVUE-370) INJECTION 76% COMPARISON:  None. FINDINGS: CTA CHEST FINDINGS Cardiovascular: Normally opacified pulmonary arteries with no pulmonary arterial filling defects seen. Atheromatous calcifications, including the coronary arteries and aorta. Borderline enlarged heart. No pericardial effusion. Mediastinum/Nodes: Mildly prominent precarinal node with a short axis diameter of 10 mm on image number 40 series 4. Mildly enlarged right hilar nodes, the largest with a short axis diameter of 11 mm on image number 54 series 4. Enlarged left hilar nodes, the largest with a short axis diameter of 10 mm on image number 52 series 4. Endotracheal 2 tip 2.7 cm above the carina. Nasogastric tube extending into the stomach. Lungs/Pleura: Dense, patchy and confluent airspace opacity in both lower lobes. Diffusely prominent interstitial markings with septal edema. Small bilateral pleural effusions. Musculoskeletal: Thoracic and lower cervical spine degenerative changes. Review of the MIP images confirms the above findings. CT ABDOMEN and PELVIS FINDINGS Hepatobiliary: Multiple gallstones in the gallbladder measuring up to 2.0 cm in maximum diameter each. No gallbladder wall thickening or pericholecystic fluid. Normal appearing liver.  Pancreas: Unremarkable. No pancreatic ductal dilatation or surrounding inflammatory changes. Spleen: Normal in size without focal abnormality. Adrenals/Urinary Tract: Small bilateral renal cysts. Normal appearing adrenal glands and ureters. Foley catheter in the urinary bladder with associated air in the bladder. Stomach/Bowel: Single distal descending colon diverticulum. Nasogastric tube tip in the mid to proximal stomach. Unremarkable small bowel. No evidence of appendicitis. Vascular/Lymphatic: Atheromatous arterial calcifications without aneurysm. No enlarged lymph nodes. Reproductive: Normal sized uterus with multiple small peripheral calcifications. No adnexal mass. Other: No abdominal wall hernia or abnormality. No abdominopelvic ascites. Musculoskeletal: Lumbar and lower thoracic spine degenerative changes. Review of the MIP images confirms the above findings. IMPRESSION: 1. No pulmonary emboli. 2. Dense, patchy and confluent airspace opacity in both lower lobes. This is most likely due to pneumonia. Patchy atelectasis is less likely. 3. Borderline cardiomegaly with changes of acute congestive heart failure. 4. Mild mediastinal and bilateral hilar adenopathy, most likely reactive. 5. Cholelithiasis without evidence cholecystitis. 6.  Calcific coronary artery and aortic atherosclerosis. Aortic Atherosclerosis (ICD10-I70.0). Electronically Signed   By: Claudie Revering M.D.   On: 08/05/2018 15:40   US Renal  Result Date: 08/06/2018 CLINICAL DATA:  Acute kidney injury EXAM: RENAL / URINARY TRACT ULTRASOUND COMPLETE COMPARISON:  None. FINDINGS: Right Kidney: Length: 11.7 cm. Echogenicity within normal limits. No mass or hydronephrosis visualized. Left Kidney: Length: 11.8 cm. Trace amount of perinephric fluid. Echogenicity within normal limits. No mass or hydronephrosis visualized. Bladder: Decompressed bladder with a Foley catheter present. Other: No focal lesion identified. Increased hepatic parenchymal  echogenicity. IMPRESSION: 1. No obstructive uropathy.  Trace amount of left perinephric fluid. Electronically Signed   By: Elbert Ewings  Patel   On: 08/06/2018 15:40   Dg Chest Port 1 View  Result Date: 08/10/2018 CLINICAL DATA:  Respiratory failure EXAM: PORTABLE CHEST 1 VIEW COMPARISON:  August 09, 2018 FINDINGS: Stable right PICC line. The NG tube is been removed. Cardiomediastinal silhouette is stable. Diffuse interstitial opacities have improved in the interval. Possible tiny right effusion. No other interval change. IMPRESSION: 1. Support apparatus as above. 2. Mild but improving edema.  Probable tiny right effusion. Electronically Signed   By: Dorise Bullion III M.D   On: 08/10/2018 07:31   Dg Chest Port 1 View  Result Date: 08/09/2018 CLINICAL DATA:  Respiratory failure. EXAM: PORTABLE CHEST 1 VIEW COMPARISON:  Radiograph of August 08, 2018. FINDINGS: Stable cardiomediastinal silhouette with central pulmonary vascular congestion is noted. Atherosclerosis of thoracic aorta is noted. Endotracheal tube has been removed. Nasogastric tube is seen entering the stomach. Right-sided PICC line is unchanged in position. No pneumothorax or pleural effusion is noted. Stable bilateral pulmonary edema is noted. Bony thorax is unremarkable. IMPRESSION: Stable central pulmonary vascular congestion is noted and bilateral pulmonary edema. Endotracheal tube has been removed. Aortic Atherosclerosis (ICD10-I70.0). Electronically Signed   By: Marijo Conception, M.D.   On: 08/09/2018 07:11   Portable Chest Xray  Result Date: 08/08/2018 CLINICAL DATA:  Intubation. EXAM: PORTABLE CHEST 1 VIEW COMPARISON:  08/07/2018. FINDINGS: NG tube has been uncoiled and repositioned. NG tube tip below left hemidiaphragm. NG tube, right PICC line in stable position. Mild cardiomegaly with bilateral interstitial prominence. Small right-sided pleural effusion. Findings suggest mild CHF. Pneumonitis cannot be excluded. No pneumothorax.  IMPRESSION: 1. NG tube has been uncoiled and repositioned. Its tip is below left hemidiaphragm. Endotracheal tube and right PICC line stable position. 2 cardiomegaly with bilateral interstitial prominence and small right pleural effusions suggesting mild CHF. Pneumonitis cannot be excluded. Electronically Signed   By: Marcello Moores  Register   On: 08/08/2018 06:50   Dg Chest Port 1 View  Result Date: 08/07/2018 CLINICAL DATA:  PICC line placement EXAM: PORTABLE CHEST 1 VIEW COMPARISON:  08/07/2018 FINDINGS: 1759 hours. Diffuse pulmonary edema pattern persists in the lungs. Small bilateral pleural effusions evident, right greater than left. Cardiopericardial silhouette is at upper limits of normal for size. Endotracheal tube tip is approximately 2 cm above the base of the carina. Right PICC line tip is positioned over the distal SVC. NG tube tip is in the proximal stomach with proximal port of the NG tube above the EG junction. NG tube remains coiled in the hypopharynx and proximal esophagus. IMPRESSION: 1. NG tube remains coiled in the hypopharynx and proximal esophagus. Tube should be withdrawn and repositioned for appropriate placement. 2. Right PICC line tip overlies the distal SVC. 3. Stable pulmonary edema pattern with small bilateral pleural effusions. These results will be called to the ordering clinician or representative by the Radiologist Assistant, and communication documented in the PACS or zVision Dashboard. Electronically Signed   By: Misty Stanley M.D.   On: 08/07/2018 18:21   Portable Chest X-ray  Result Date: 08/07/2018 CLINICAL DATA:  Endotracheal tube placement. NG tube placement. Pulmonary edema. EXAM: PORTABLE CHEST 1 VIEW 2:28 p.m. COMPARISON:  Chest x-ray dated 08/07/2018 at 1:36 p.m. and chest x-rays dated 08/06/2018 and 08/05/2018 FINDINGS: Endotracheal tube tip is 5 cm above the carina. NG tube tip is at the gastroesophageal junction and needs to be advanced. NG tube is looped in the  cervical esophagus. Bilateral interstitial pulmonary edema has improved. Small right pleural effusion. Heart size  is normal. Slight pulmonary vascular prominence. Aortic atherosclerosis. No acute bone abnormality. IMPRESSION: 1. Endotracheal tube in good position. 2. NG tube tip is at the gastroesophageal junction. The NG tube is looped in the cervical esophagus. 3. Improved bilateral interstitial pulmonary edema. Small residual right effusion. No appreciable left effusion. 4.  Aortic Atherosclerosis (ICD10-I70.0). Electronically Signed   By: Lorriane Shire M.D.   On: 08/07/2018 15:01   Dg Chest Port 1 View  Result Date: 08/07/2018 CLINICAL DATA:  77 year old female with respiratory failure, pulmonary edema and/or pneumonia on recent chest CTA. EXAM: PORTABLE CHEST 1 VIEW COMPARISON:  0527 hours today and earlier. FINDINGS: Portable AP semi upright view at 1336 hours. Cardiac and mediastinal contours remain normal. Calcified aortic atherosclerosis. Visualized tracheal air column is within normal limits. The patient has been extubated and the enteric tube removed. Acutely increased diffuse pulmonary interstitial opacity, although lung volumes are stable to mildly larger. Small bilateral pleural effusions suspected. No pneumothorax. The confluent lower lung airspace disease may persist in the form of retrocardiac opacity. Paucity of upper abdominal bowel gas. No acute osseous abnormality identified. IMPRESSION: 1. Extubated and enteric tube removed. 2. New bilateral pulmonary interstitial opacity favored to reflect acute pulmonary edema. 3. Confluent bilateral lower lobe airspace disease seen on 08/10 19 May persist. Suspected small bilateral pleural effusions Electronically Signed   By: Genevie Ann M.D.   On: 08/07/2018 14:03   Dg Chest Port 1 View  Result Date: 08/07/2018 CLINICAL DATA:  Pneumonia EXAM: PORTABLE CHEST 1 VIEW COMPARISON:  08/06/2018 and prior radiographs FINDINGS: An endotracheal tube with tip  4.2 cm above the carina and NG tube entering the stomach again noted. Mild bilateral interstitial opacities and LOWER lung consolidation/atelectasis again noted. There is no evidence of pneumothorax or large pleural effusion. IMPRESSION: Slight advancement of endotracheal tube, otherwise unchanged appearance of the chest. Electronically Signed   By: Margarette Canada M.D.   On: 08/07/2018 07:16   Dg Chest Port 1 View  Result Date: 08/06/2018 CLINICAL DATA:  Pneumonia, abnormal blood gas EXAM: PORTABLE CHEST 1 VIEW COMPARISON:  08/05/2018 FINDINGS: Endotracheal tube terminates 6.5 cm above the carina. Enteric tube courses into the proximal gastric body. Mild left basilar atelectasis. Possible small left pleural effusion. Right lung is essentially clear. No pneumothorax. The heart is normal in size. Thoracic aortic atherosclerosis. IMPRESSION: Endotracheal tube terminates 6.5 cm above the carina. Mild left basilar atelectasis with possible small left pleural effusion. Electronically Signed   By: Julian Hy M.D.   On: 08/06/2018 08:08   Dg Chest Port 1 View  Result Date: 08/05/2018 CLINICAL DATA:  ET and OG tube placement EXAM: PORTABLE CHEST 1 VIEW COMPARISON:  August 05, 2018 FINDINGS: Bilateral primarily interstitial pulmonary infiltrates remain. This was thought to represent pneumonia on the CT scan from earlier today. No pneumothorax. The ETT terminates in the mid trachea. The NG tube terminates with the side port near the GE junction in the distal tip in the stomach. The heart size is borderline. The hila and mediastinum are unremarkable. No pulmonary nodules or masses. IMPRESSION: 1. The ETT is in good position. 2. The side port of the NG tube is near the GE junction. Consider advancement. 3. Interstitial pulmonary infiltrates remain. This was thought to represent pneumonia on today's CT scan. Electronically Signed   By: Dorise Bullion III M.D   On: 08/05/2018 20:40   Dg Chest Port 1 View  Result  Date: 08/05/2018 CLINICAL DATA:  Patient poor historian at  this time. Unable to obtain hx at this time. Encounter for SOB. EXAM: PORTABLE CHEST - 1 VIEW COMPARISON:  02/16/2015 FINDINGS: Moderate interstitial edema or infiltrates and central pulmonary vascular congestion, new since previous. Heart size upper limits normal. Aortic Atherosclerosis (ICD10-170.0). No effusion.  No pneumothorax. Visualized bones unremarkable. IMPRESSION: 1. Moderate bilateral interstitial edema or infiltrates, new since previous. Electronically Signed   By: Lucrezia Europe M.D.   On: 08/05/2018 14:25   Dg Abd Portable 1v  Result Date: 08/05/2018 CLINICAL DATA:  NG tube placement. EXAM: PORTABLE ABDOMEN - 1 VIEW COMPARISON:  Current abdomen pelvis CT. FINDINGS: Nasogastric tube extends below the diaphragm with its tip in the proximal stomach. Side hole of the tube lies in the distal esophagus. IMPRESSION: NG tube tip in the proximal stomach. Electronically Signed   By: Lajean Manes M.D.   On: 08/05/2018 19:26   [place] Ekg Site Rite  Result Date: 08/07/2018 If Site Rite image not attached, placement could not be confirmed due to current cardiac rhythm.    Assessment and Recommendation  77 y.o. female with diabetes hypertension hyperlipidemia chronic kidney disease with acute respiratory failure and sinus tachycardia with elevated troponin consistent with demand ischemia without evidence of heart failure and or acute coronary syndrome 1.  Continue supportive care for respiratory failure and pulmonary congestion 2.  Metoprolol amlodipine for heart rate control and blood pressure control without change today 3.  Antiplatelet medication management with aspirin 4.  No further cardiac diagnostics necessary at this time 5.  Begin ambulation and follow for improvements of symptoms and or further intervention depending on symptoms 6.  Okay for discharge home from the cardiac standpoint if ambulating well and recovered from above with  follow-up in the next 2 weeks  Signed, Serafina Royals M.D. FACC

## 2018-08-10 NOTE — Progress Notes (Signed)
Inpatient Diabetes Program Recommendations  AACE/ADA: New Consensus Statement on Inpatient Glycemic Control (2015)  Target Ranges:  Prepandial:   less than 140 mg/dL      Peak postprandial:   less than 180 mg/dL (1-2 hours)      Critically ill patients:  140 - 180 mg/dL   Results for Brittney [name], Brittney [name] (MRN [id number]) as of 08/10/2018 08:14  Ref. Range 08/09/2018 07:06 08/09/2018 12:05 08/09/2018 15:54 08/09/2018 21:32  Glucose-Capillary Latest Ref Range: 70 - 99 mg/dL 161 (H)  3 units NOVOLOG  210 (H)  5 units NOVOLOG  241 (H)  5 units NOVOLOG  209 (H)  2 units NOVOLOG    Results for Brittney [name], Brittney [name] (MRN [id number]) as of 08/10/2018 08:14  Ref. Range 08/10/2018 07:38  Glucose-Capillary Latest Ref Range: 70 - 99 mg/dL 263 (H)  5 units NOVOLOG     Home DM Meds: Januvia 100 mg daily                             Glipizide 10 mg BID                             Metformin 1000 mg BID                             Victoza 1.8 mg daily  Current Orders: Novolog Moderate Correction Scale/ SSI (0-15 units) TID AC + HS     Started on [place] diet yesterday.  CBGs elevated since yesterday as well.  Takes 3 oral DM meds and 1 injectable DM med at home for glucose control.     MD- Please consider the following in-hospital insulin adjustments while home DM medications are on hold:  1. Start Lantus 7 units daily (0.1 units/kg dosing based on weight of 75 kg)  2. Start low dose Novolog Meal Coverage: Novolog 3 units TID with meals   (Please add the following Hold Parameters: Hold if pt eats <50% of meal, Hold if pt NPO)     --Will follow patient during hospitalization--  Wyn Quaker RN, MSN, CDE Diabetes Coordinator Inpatient Glycemic Control Team Team Pager: [phone] (8a-5p)

## 2018-08-10 NOTE — Progress Notes (Signed)
Pt resting calmly in bed. Pt states that she feels better. Denies shortness of breath. 02 sat 98% on room air.

## 2018-08-10 NOTE — Progress Notes (Signed)
Nutrition Follow-up  DOCUMENTATION CODES:   Not applicable  INTERVENTION:  Provide Premier Protein po BID, each supplement provides 160 kcal and 30 grams of protein.  NUTRITION DIAGNOSIS:   Inadequate oral intake related to inability to eat as evidenced by NPO status.  Resolving as diet now advanced.  GOAL:   Patient will meet greater than or equal to 90% of their needs  Progressing.  MONITOR:   Vent status, Labs, Weight trends, TF tolerance, I & O's  REASON FOR ASSESSMENT:   Ventilator    ASSESSMENT:   77 year old female with PMHx of DM, GERD, HTN, HLD, vitamin D deficiency, chronic tobacco abuse, COPD who is admitted with acute respiratory failure with hypoxemia and hypercapnia secondary to acute exacerbation of COPD requiring intubation 8/10, PNA, sepsis.   -Tube feeds were never initiated on 8/11 due to bloody aspirate from OGT. -Patient was extubated on 8/12 in AM. She subsequently vomited and presumably aspirated with subsequent respiratory distress requiring reintubation. -Per GI note from 8/12 okay to start tube feeds from GI standpoint. -Patient was then extubated on AM of 8/13. -NGT was clamped on 8/13 and removed on 8/14. -Diet was advanced to full liquids on 8/14 and heart healthy/carbohydrate modified on 8/15.  Met with patient at bedside. She had just finished full liquid breakfast tray. She reports she is tolerating the liquids very well. Denies any N/V or abdominal pain. She reports she had a very good appetite and intake PTA and she has been weight stable at around 165-170 lbs (75-77.3 kg). Patient denies any food allergies or intolerances.  Medications reviewed and include: glipizide 10 mg daily with breakfast starting tomorrow, Novolog 0-15 units TID, Novolog 0-5 units QHS, metformin 500 mg BID, pantoprazole.  Labs reviewed: CBG 210-295, BUN 26.  Discussed with RN and on rounds.  Diet Order:   Diet Order            Diet heart healthy/carb  modified Room service appropriate? Yes; Fluid consistency: Thin  Diet effective now              EDUCATION NEEDS:   No education needs have been identified at this time  Skin:  Skin Assessment: Reviewed RN Assessment  Last BM:  Unknown  Height:   Ht Readings from Last 1 Encounters:  08/05/18 _0  (1.702 m)    Weight:   Wt Readings from Last 1 Encounters:  08/07/18 75.5 kg    Ideal Body Weight:  61.4 kg  BMI:  Body mass index is 26.07 kg/m.  Estimated Nutritional Needs:   Kcal:  1625-1895 (MSJ x 1.2-1.4)  Protein:  90-105 grams (1.2-1.4 grams/kg)  Fluid:  1.5-1.8 L/day (25-30 mL/kg IBW)  Willey Blade, MS, RD, LDN Office: [phone] Pager: [phone] After Hours/Weekend Pager: [phone]

## 2018-08-11 LAB — GLUCOSE, CAPILLARY
Glucose-Capillary: 218 mg/dL — ABNORMAL HIGH (ref 70–99)
Glucose-Capillary: 234 mg/dL — ABNORMAL HIGH (ref 70–99)

## 2018-08-11 LAB — PROCALCITONIN: Procalcitonin: <0.1 ng/mL

## 2018-08-11 MED ORDER — METOPROLOL TARTRATE 50 MG PO TABS: 50.0000 mg | ORAL_TABLET | Freq: Two times a day (BID) | ORAL | 1 refills | Status: DC

## 2018-08-11 NOTE — Progress Notes (Signed)
RN removed PICC line after getting verbal order from Dr. Verdell Carmine.  Patient went home with her children in a private vehicle.  Phillis Knack, RN

## 2018-08-11 NOTE — Care Management Note (Signed)
Case Management Note  Patient Details  Name: Brittney BAINE MRN: [id number] Date of Birth: 08/21/1941   Patient to discharge home today.  Agreeable to home health and does not have a preference of agency.  Referral made to [facility] with [place].  BSC and RW to be delivered prior to discharge.  COPD protocol to be signed by MD and provided to [facility].    RNCM spoke with daughters Mason Jim, and patient regarding medication.  Glenard Haring states "its not that the medications cost a lot. We are able to afford them". "the problem came when she ran out of her medication, the pharmacy said they couldn't fill it again until the MD provided the insurance more information." Patient states they were able to pick up her medication in July, and still has more of the medication at home.  Per DM coordinator plan is to continue current regimen.  Subjective/Objective:                    Action/Plan:   Expected Discharge Date:  08/11/18               Expected Discharge Plan:  [place]  In-House Referral:     Discharge planning Services  CM Consult, Medication Assistance  Post Acute Care Choice:  Durable Medical Equipment, Home Health Choice offered to:  Patient, Adult Children  DME Arranged:  3-N-1, Walker rolling DME Agency:  [place] Arranged:  RN, PT [facility] Agency:  [facility]  Status of Service:  Completed, signed off  If discussed at [place] of Stay Meetings, dates discussed:    Additional Comments:  Beverly Sessions, RN 08/11/2018, 1:52 PM

## 2018-08-11 NOTE — Progress Notes (Signed)
Brittney [name]  Next of kin: (Daughters) Otila Kluver [phone] Glenard Haring: [phone] Kat: [phone] Tab: [phone]  PCP: Elisabeth Cara, NP @ [facility]   If patient discharges after hours, please call [phone].   [name] 08/11/2018, 12:48 PM

## 2018-08-11 NOTE — Care Management Important Message (Addendum)
Patient signed initial copy of Medicare IM.  Left copy with patient for her records.

## 2018-08-11 NOTE — Discharge Summary (Signed)
Brittney [name] at [name] NAME: Brittney [name]    MR#:  [id number]  DATE OF BIRTH:  20-Nov-1941  DATE OF ADMISSION:  08/05/2018 ADMITTING PHYSICIAN: [name], MD  DATE OF DISCHARGE: 08/11/2018  PRIMARY CARE PHYSICIAN: System, Pcp Not In    ADMISSION DIAGNOSIS:  Acute respiratory failure with hypoxia ([place]) [J96.01] Septic shock (HCC) [A41.9, R65.21] Pneumonia of both lower lobes due to infectious organism ([place]) [J18.1]  DISCHARGE DIAGNOSIS:  Active Problems:   Acute on chronic respiratory failure with hypoxia and hypercapnia ([place])   SECONDARY DIAGNOSIS:   Past Medical History:  Diagnosis Date  . Diabetes mellitus without complication ([place])   . GERD (gastroesophageal reflux disease)   . Hyperlipidemia   . Hypertension   . Vitamin D deficiency     HOSPITAL COURSE:   77 year old female with past medical history of COPD with ongoing tobacco abuse, diabetes, hypertension, hyperlipidemia, GERD who presents to the hospital due to shortness of breath and noted to be in acute on chronic respiratory failure with hypoxia and hypercapnia and also sepsis.  1.  Acute on chronic respiratory failure with hypoxia and hypercapnia- secondary to COPD exacerbation. -Patient was initially on BiPAP and did not do well and then was intubated.  Now extubated and doing well on room air.   2.  COPD exacerbation-source of patient's worsening respiratory failure much improved now. Much improved now and off the ventilator.  While in the hospital patient was on steroids and duo nebs.  She will resume her albuterol inhaler upon discharge. -Patient was strongly advised to abstain from smoking.  3.  Aspiration pneumonitis/flash pulmonary edema. - Patient was extubated and reintubated and thought to have aspiration pneumonitis but x-ray findings were suggestive of pulmonary edema.  CTA chest was negative for pulmonary embolism.  Patient now extubated and doing  well.  No clinical evidence of aspiration pneumonia, off antibiotics.   4.  Diabetes type 2 without complication- in the hospital patient was on sliding scale insulin, -Patient will glipizide metformin, Januvia and Victoza upon discharge.    5.  CHF- acute on chronic diastolic CHF.    Patient was given some pulse doses of diuretics and responded well to it. - Patient developed this secondary to significantly abated blood pressures which have improved as her blood pressure regimen has been adjusted.  Attempted to contact her cardiologist to see if she would need diuretics but was unable to get in touch with him.  Patient can follow-up with cardiology and get this addressed as an outpatient. -Her antihypertensive regimen has been readjusted.  6. GERD- she will  cont. Protonix.   7. Hyperlipidemia - she will cont. Atorvastatin.   8. Essential HTN - pt. Will cont. Her Norvasc, losartan and metoprolol was added to her regimen.  9.  Elevated troponin likely supply demand ischemia secondary to hypoxemia from CHF/COPD. -Troponins did trend downwards, she had no acute chest pain.  Cardiology did not recommend any acute intervention. -Patient's echocardiogram showed normal ejection fraction with no acute wall motion abnormalities  She is being discharged home with home health nursing, physical therapy services.  DISCHARGE CONDITIONS:   Stable  CONSULTS OBTAINED:  Treatment Team:  Cassandria Santee, MD Corey Skains, MD  DRUG ALLERGIES:   Allergies  Allergen Reactions  . Accupril [Quinapril Hcl]     DISCHARGE MEDICATIONS:   Allergies as of 08/11/2018      Reactions   Accupril [quinapril Hcl]  Medication List    TAKE these medications   albuterol 108 (90 Base) MCG/ACT inhaler Commonly known as:  PROVENTIL HFA;VENTOLIN HFA Inhale 2 puffs into the lungs every 6 (six) hours as needed for wheezing or shortness of breath.   amLODipine 10 MG tablet Commonly known as:   NORVASC Take 10 mg by mouth daily.   aspirin 81 MG tablet Take 81 mg by mouth daily.   atorvastatin 40 MG tablet Commonly known as:  LIPITOR Take 1 tablet by mouth daily.   cetirizine 10 MG tablet Commonly known as:  ZYRTEC Take 10 mg by mouth daily.   glipiZIDE 10 MG 24 hr tablet Commonly known as:  GLUCOTROL XL Take 10 mg by mouth 2 (two) times daily.   losartan 100 MG tablet Commonly known as:  COZAAR Take 100 mg by mouth daily.   metFORMIN 1000 MG tablet Commonly known as:  GLUCOPHAGE Take 1,000 mg by mouth 2 (two) times daily with a meal.   metoprolol tartrate 50 MG tablet Commonly known as:  LOPRESSOR Take 1 tablet (50 mg total) by mouth 2 (two) times daily.   omeprazole 20 MG capsule Commonly known as:  PRILOSEC Take 20 mg by mouth daily.   ranitidine 150 MG tablet Commonly known as:  ZANTAC Take 1 tablet by mouth 2 (two) times daily.   sitaGLIPtin 100 MG tablet Commonly known as:  JANUVIA Take 100 mg by mouth daily.   VICTOZA 18 MG/3ML Sopn Generic drug:  liraglutide Inject 1.8 mg into the skin every morning.            Durable Medical Equipment  (From admission, onward)         Start     Ordered   08/11/18 1219  For home use only DME Bedside commode  Once    Question:  Patient needs a bedside commode to treat with the following condition  Answer:  Weakness   08/11/18 1219   08/11/18 1218  For home use only DME Walker rolling  Once    Question:  Patient needs a walker to treat with the following condition  Answer:  Weakness   08/11/18 1219            DISCHARGE INSTRUCTIONS:   DIET:  Cardiac diet and Diabetic diet  DISCHARGE CONDITION:  Stable  ACTIVITY:  Activity as tolerated  OXYGEN:  Home Oxygen: No.   Oxygen Delivery: room air  DISCHARGE LOCATION:  home   If you experience worsening of your admission symptoms, develop shortness of breath, life threatening emergency, suicidal or homicidal thoughts you must seek medical  attention immediately by calling 911 or calling your MD immediately  if symptoms less severe.  You Must read complete instructions/literature along with all the possible adverse reactions/side effects for all the Medicines you take and that have been prescribed to you. Take any new Medicines after you have completely understood and accpet all the possible adverse reactions/side effects.   Please note  You were cared for by a hospitalist during your hospital stay. If you have any questions about your discharge medications or the care you received while you were in the hospital after you are discharged, you can call the unit and asked to speak with the hospitalist on call if the hospitalist that took care of you is not available. Once you are discharged, your primary care physician will handle any further medical issues. Please note that NO REFILLS for any discharge medications will be authorized once you are  discharged, as it is imperative that you return to your primary care physician (or establish a relationship with a primary care physician if you do not have one) for your aftercare needs so that they can reassess your need for medications and monitor your lab values.     Today   Shortness of breath much improved since admission, patient sitting up in chair denies any complaints.  Daughter is at bedside.  No wheezing, bronchospasm.  Will discharge home with home health services today.  VITAL SIGNS:  Blood pressure 137/65, pulse 87, temperature 97.7 F (36.5 C), temperature source Oral, resp. rate 18, height 5\' 7"  (1.702 m), weight 75.5 kg, SpO2 97 %.  I/O:    Intake/Output Summary (Last 24 hours) at 08/11/2018 1618 Last data filed at 08/11/2018 1342 Gross per 24 hour  Intake 720 ml  Output -  Net 720 ml    PHYSICAL EXAMINATION:   GENERAL:  77 y.o.-year-old patient lying in the bed with no acute distress.  EYES: Pupils equal, round, reactive to light and accommodation. No scleral  icterus. Extraocular muscles intact.  HEENT: Head atraumatic, normocephalic. Oropharynx and nasopharynx clear.  NECK:  Supple, no jugular venous distention. No thyroid enlargement, no tenderness.  LUNGS: Normal breath sounds bilaterally, no wheezing, rales,rhonchi. No use of accessory muscles of respiration.  CARDIOVASCULAR: S1, S2 normal. No murmurs, rubs, or gallops.  ABDOMEN: Soft, non-tender, non-distended. Bowel sounds present. No organomegaly or mass.  EXTREMITIES: No pedal edema, cyanosis, or clubbing.  NEUROLOGIC: Cranial nerves II through XII are intact. No focal motor or sensory defecits b/l.  PSYCHIATRIC: The patient is alert and oriented x 3.  SKIN: No obvious rash, lesion, or ulcer.   DATA REVIEW:   CBC Recent Labs  Lab 08/10/18 0634  WBC 13.4*  HGB 11.6*  HCT 34.3*  PLT 332    Chemistries  Recent Labs  Lab 08/05/18 1355  08/10/18 0634  NA 137   < > 143  K 3.7   < > 3.5  CL 104   < > 108  CO2 19*   < > 25  GLUCOSE 405*   < > 248*  BUN 14   < > 26*  CREATININE 1.16*   < > 0.90  CALCIUM 9.0   < > 8.5*  MG  --    < > 1.9  AST 43*  --   --   ALT 22  --   --   ALKPHOS 64  --   --   BILITOT 0.7  --   --    < > = values in this interval not displayed.    Cardiac Enzymes Recent Labs  Lab 08/09/18 0306  TROPONINI 1.00*     RADIOLOGY:  Dg Chest Port 1 View  Result Date: 08/10/2018 CLINICAL DATA:  Respiratory failure EXAM: PORTABLE CHEST 1 VIEW COMPARISON:  August 09, 2018 FINDINGS: Stable right PICC line. The NG tube is been removed. Cardiomediastinal silhouette is stable. Diffuse interstitial opacities have improved in the interval. Possible tiny right effusion. No other interval change. IMPRESSION: 1. Support apparatus as above. 2. Mild but improving edema.  Probable tiny right effusion. Electronically Signed   By: Dorise Bullion III M.D   On: 08/10/2018 07:31      Management plans discussed with the patient, family and they are in agreement.  CODE  STATUS:     Code Status Orders  (From admission, onward)         Start  Ordered   08/05/18 1800  Full code  Continuous     08/05/18 1759        Code Status History    This patient has a current code status but no historical code status.      TOTAL TIME TAKING CARE OF THIS PATIENT: 45 minutes.    [name] M.D on 08/11/2018 at 4:18 PM  Between 7am to 6pm - Pager - [phone]  After 6pm go to [email or web address] - Proofreader  [facility]  Office  [phone]  CC: Primary care physician; System, Pcp Not In

## 2018-08-15 ENCOUNTER — Telehealth: Payer: Self-pay

## 2018-08-15 NOTE — Telephone Encounter (Signed)
Flagged on EMMI report for not reading discharge papers, not knowing who to call about changes in condition, and not having a follow up scheduled.  Called and spoke with patient.  She said she did receive her discharge papers, but has not had a chance to read them.  They are available to reference as needed.  Reviewed discharge papers and reminded patient of appointment with [facility] on 09/01/18 at 11:20am.  Encouraged patient to call [facility] for any questions or concerns.  She mentioned the home health nurse saw her today and also left a list of contact numbers to reach out to if any questions.  No other questions or concerns at this time.  I thanked her for her time and informed her that she would receive one more automated call checking on her in the next few days.

## 2018-08-23 ENCOUNTER — Emergency Department: Payer: Medicare HMO

## 2018-08-23 ENCOUNTER — Inpatient Hospital Stay
Admission: EM | Admit: 2018-08-23 | Discharge: 2018-08-25 | DRG: 871 | Disposition: A | Discharge status: Home Health | Payer: Medicare HMO | Attending: Internal Medicine | Admitting: Internal Medicine

## 2018-08-23 ENCOUNTER — Other Ambulatory Visit: Payer: Self-pay

## 2018-08-23 ENCOUNTER — Inpatient Hospital Stay: Payer: Medicare HMO

## 2018-08-23 DIAGNOSIS — A419 Sepsis, unspecified organism: Principal | ICD-10-CM | POA: Diagnosis present

## 2018-08-23 DIAGNOSIS — Z888 Allergy status to other drugs, medicaments and biological substances status: Secondary | ICD-10-CM

## 2018-08-23 DIAGNOSIS — J9621 Acute and chronic respiratory failure with hypoxia: Secondary | ICD-10-CM | POA: Diagnosis present

## 2018-08-23 DIAGNOSIS — E785 Hyperlipidemia, unspecified: Secondary | ICD-10-CM | POA: Diagnosis present

## 2018-08-23 DIAGNOSIS — Z79899 Other long term (current) drug therapy: Secondary | ICD-10-CM

## 2018-08-23 DIAGNOSIS — J441 Chronic obstructive pulmonary disease with (acute) exacerbation: Secondary | ICD-10-CM | POA: Diagnosis present

## 2018-08-23 DIAGNOSIS — I5033 Acute on chronic diastolic (congestive) heart failure: Secondary | ICD-10-CM | POA: Diagnosis present

## 2018-08-23 DIAGNOSIS — I110 Hypertensive heart disease with heart failure: Secondary | ICD-10-CM | POA: Diagnosis present

## 2018-08-23 DIAGNOSIS — Z7982 Long term (current) use of aspirin: Secondary | ICD-10-CM

## 2018-08-23 DIAGNOSIS — E039 Hypothyroidism, unspecified: Secondary | ICD-10-CM | POA: Diagnosis present

## 2018-08-23 DIAGNOSIS — E119 Type 2 diabetes mellitus without complications: Secondary | ICD-10-CM | POA: Diagnosis present

## 2018-08-23 DIAGNOSIS — J189 Pneumonia, unspecified organism: Secondary | ICD-10-CM | POA: Diagnosis present

## 2018-08-23 DIAGNOSIS — Y95 Nosocomial condition: Secondary | ICD-10-CM | POA: Diagnosis present

## 2018-08-23 DIAGNOSIS — J449 Chronic obstructive pulmonary disease, unspecified: Secondary | ICD-10-CM | POA: Diagnosis present

## 2018-08-23 DIAGNOSIS — F17210 Nicotine dependence, cigarettes, uncomplicated: Secondary | ICD-10-CM | POA: Diagnosis present

## 2018-08-23 DIAGNOSIS — J440 Chronic obstructive pulmonary disease with acute lower respiratory infection: Secondary | ICD-10-CM | POA: Diagnosis present

## 2018-08-23 DIAGNOSIS — K219 Gastro-esophageal reflux disease without esophagitis: Secondary | ICD-10-CM | POA: Diagnosis present

## 2018-08-23 DIAGNOSIS — E872 Acidosis: Secondary | ICD-10-CM | POA: Diagnosis present

## 2018-08-23 DIAGNOSIS — Z7984 Long term (current) use of oral hypoglycemic drugs: Secondary | ICD-10-CM | POA: Diagnosis not present

## 2018-08-23 DIAGNOSIS — J9601 Acute respiratory failure with hypoxia: Secondary | ICD-10-CM | POA: Diagnosis not present

## 2018-08-23 LAB — LACTIC ACID, PLASMA
Lactic Acid, Venous: 4.3 mmol/L (ref 0.5–1.9)
Lactic Acid, Venous: 3.5 mmol/L (ref 0.5–1.9)
Lactic Acid, Venous: 5.4 mmol/L (ref 0.5–1.9)
Lactic Acid, Venous: 3.5 mmol/L (ref 0.5–1.9)

## 2018-08-23 LAB — BLOOD GAS, ARTERIAL
Acid-base deficit: 3.6 mmol/L — ABNORMAL HIGH (ref 0.0–2.0)
Bicarbonate: 20.1 mmol/L (ref 20.0–28.0)
FIO2: 0.21
O2 Saturation: 90.7 %
Patient temperature: 37
pCO2 arterial: 31 mmHg — ABNORMAL LOW (ref 32.0–48.0)
pH, Arterial: 7.42 (ref 7.350–7.450)
pO2, Arterial: 59 mmHg — ABNORMAL LOW (ref 83.0–108.0)

## 2018-08-23 LAB — COMPREHENSIVE METABOLIC PANEL
ALT: 105 U/L — ABNORMAL HIGH (ref 0–44)
AST: 40 U/L (ref 15–41)
Albumin: 3.5 g/dL (ref 3.5–5.0)
Alkaline Phosphatase: 83 U/L (ref 38–126)
Anion gap: 11 (ref 5–15)
BUN: 20 mg/dL (ref 8–23)
CO2: 19 mmol/L — ABNORMAL LOW (ref 22–32)
Calcium: 8.6 mg/dL — ABNORMAL LOW (ref 8.9–10.3)
Chloride: 106 mmol/L (ref 98–111)
Creatinine, Ser: 1 mg/dL (ref 0.44–1.00)
GFR calc Af Amer: >60 mL/min (ref >60)
GFR calc non Af Amer: 53 mL/min — ABNORMAL LOW (ref >60)
Glucose, Bld: 350 mg/dL — ABNORMAL HIGH (ref 70–99)
Potassium: 3.8 mmol/L (ref 3.5–5.1)
Sodium: 136 mmol/L (ref 135–145)
Total Bilirubin: 0.7 mg/dL (ref 0.3–1.2)
Total Protein: 7.6 g/dL (ref 6.5–8.1)

## 2018-08-23 LAB — GLUCOSE, CAPILLARY
Glucose-Capillary: 332 mg/dL — ABNORMAL HIGH (ref 70–99)
Glucose-Capillary: 368 mg/dL — ABNORMAL HIGH (ref 70–99)
Glucose-Capillary: 307 mg/dL — ABNORMAL HIGH (ref 70–99)
Glucose-Capillary: 299 mg/dL — ABNORMAL HIGH (ref 70–99)
Glucose-Capillary: 326 mg/dL — ABNORMAL HIGH (ref 70–99)

## 2018-08-23 LAB — BLOOD GAS, VENOUS
Acid-base deficit: 8.8 mmol/L — ABNORMAL HIGH (ref 0.0–2.0)
Bicarbonate: 19.2 mmol/L — ABNORMAL LOW (ref 20.0–28.0)
O2 Saturation: 75.7 %
Patient temperature: 37
pCO2, Ven: 48 mmHg (ref 44.0–60.0)
pH, Ven: 7.21 — ABNORMAL LOW (ref 7.250–7.430)
pO2, Ven: 50 mmHg — ABNORMAL HIGH (ref 32.0–45.0)

## 2018-08-23 LAB — PHOSPHORUS: Phosphorus: 2.9 mg/dL (ref 2.5–4.6)

## 2018-08-23 LAB — BASIC METABOLIC PANEL
Anion gap: 9 (ref 5–15)
BUN: 31 mg/dL — ABNORMAL HIGH (ref 8–23)
CO2: 21 mmol/L — ABNORMAL LOW (ref 22–32)
Calcium: 8.5 mg/dL — ABNORMAL LOW (ref 8.9–10.3)
Chloride: 102 mmol/L (ref 98–111)
Creatinine, Ser: 1.22 mg/dL — ABNORMAL HIGH (ref 0.44–1.00)
GFR calc Af Amer: 48 mL/min — ABNORMAL LOW (ref >60)
GFR calc non Af Amer: 42 mL/min — ABNORMAL LOW (ref >60)
Glucose, Bld: 357 mg/dL — ABNORMAL HIGH (ref 70–99)
Potassium: 5 mmol/L (ref 3.5–5.1)
Sodium: 132 mmol/L — ABNORMAL LOW (ref 135–145)

## 2018-08-23 LAB — PROCALCITONIN: Procalcitonin: <0.1 ng/mL

## 2018-08-23 LAB — BRAIN NATRIURETIC PEPTIDE: B Natriuretic Peptide: 507 pg/mL — ABNORMAL HIGH (ref 0.0–100.0)

## 2018-08-23 LAB — MRSA PCR SCREENING: MRSA by PCR: NEGATIVE

## 2018-08-23 LAB — TSH: TSH: 6.465 u[IU]/mL — ABNORMAL HIGH (ref 0.350–4.500)

## 2018-08-23 LAB — TROPONIN I: Troponin I: 0.09 ng/mL (ref <0.03)

## 2018-08-23 LAB — MAGNESIUM: Magnesium: 2.3 mg/dL (ref 1.7–2.4)

## 2018-08-23 MED ORDER — IPRATROPIUM-ALBUTEROL 0.5-2.5 (3) MG/3ML IN SOLN
RESPIRATORY_TRACT | Status: AC
  Administered 2018-08-23: 3 mL
  Filled 2018-08-23: qty 9

## 2018-08-23 MED ORDER — VANCOMYCIN HCL IN DEXTROSE 1-5 GM/200ML-% IV SOLN
1000.0000 mg | Freq: Once | INTRAVENOUS | Status: AC
  Administered 2018-08-23: 1000 mg via INTRAVENOUS
  Filled 2018-08-23: qty 200

## 2018-08-23 MED ORDER — ATORVASTATIN CALCIUM 20 MG PO TABS
40.0000 mg | ORAL_TABLET | Freq: Every day | ORAL | Status: DC
  Administered 2018-08-23 – 2018-08-25 (×3): 40 mg via ORAL
  Filled 2018-08-23 (×3): qty 2

## 2018-08-23 MED ORDER — IPRATROPIUM-ALBUTEROL 0.5-2.5 (3) MG/3ML IN SOLN
3.0000 mL | Freq: Four times a day (QID) | RESPIRATORY_TRACT | Status: DC
  Administered 2018-08-23 – 2018-08-25 (×9): 3 mL via RESPIRATORY_TRACT
  Filled 2018-08-23 (×8): qty 3

## 2018-08-23 MED ORDER — LINAGLIPTIN 5 MG PO TABS
5.0000 mg | ORAL_TABLET | Freq: Every day | ORAL | Status: DC
  Administered 2018-08-23 – 2018-08-25 (×3): 5 mg via ORAL
  Filled 2018-08-23 (×3): qty 1

## 2018-08-23 MED ORDER — SODIUM CHLORIDE 0.9 % IV SOLN
INTRAVENOUS | Status: DC
  Administered 2018-08-23: 04:00:00 via INTRAVENOUS

## 2018-08-23 MED ORDER — METHYLPREDNISOLONE SODIUM SUCC 125 MG IJ SOLR
INTRAMUSCULAR | Status: AC
  Administered 2018-08-23: 125 mg
  Filled 2018-08-23: qty 2

## 2018-08-23 MED ORDER — CHLORHEXIDINE GLUCONATE 0.12 % MT SOLN
15.0000 mL | Freq: Two times a day (BID) | OROMUCOSAL | Status: DC
  Administered 2018-08-23 – 2018-08-24 (×3): 15 mL via OROMUCOSAL
  Filled 2018-08-23 (×3): qty 15

## 2018-08-23 MED ORDER — LOSARTAN POTASSIUM 50 MG PO TABS
100.0000 mg | ORAL_TABLET | Freq: Every day | ORAL | Status: DC
  Administered 2018-08-23 – 2018-08-25 (×3): 100 mg via ORAL
  Filled 2018-08-23 (×3): qty 2

## 2018-08-23 MED ORDER — SODIUM CHLORIDE 0.9 % IV SOLN
INTRAVENOUS | Status: DC | PRN
  Administered 2018-08-23: 250 mL via INTRAVENOUS

## 2018-08-23 MED ORDER — IPRATROPIUM-ALBUTEROL 0.5-2.5 (3) MG/3ML IN SOLN: 3.0000 mL | Freq: Four times a day (QID) | RESPIRATORY_TRACT | Status: DC

## 2018-08-23 MED ORDER — SODIUM CHLORIDE 0.9 % IV SOLN
2.0000 g | Freq: Once | INTRAVENOUS | Status: AC
  Administered 2018-08-23: 2 g via INTRAVENOUS
  Filled 2018-08-23: qty 2

## 2018-08-23 MED ORDER — INSULIN GLARGINE 100 UNIT/ML ~~LOC~~ SOLN
12.0000 [IU] | Freq: Every day | SUBCUTANEOUS | Status: DC
  Administered 2018-08-23 – 2018-08-24 (×2): 12 [IU] via SUBCUTANEOUS
  Filled 2018-08-23 (×4): qty 0.12

## 2018-08-23 MED ORDER — ONDANSETRON HCL 4 MG PO TABS: 4.0000 mg | ORAL_TABLET | Freq: Four times a day (QID) | ORAL | Status: DC | PRN

## 2018-08-23 MED ORDER — INSULIN ASPART 100 UNIT/ML ~~LOC~~ SOLN
0.0000 [IU] | Freq: Three times a day (TID) | SUBCUTANEOUS | Status: DC
  Administered 2018-08-23: 7 [IU] via SUBCUTANEOUS
  Administered 2018-08-23: 9 [IU] via SUBCUTANEOUS
  Administered 2018-08-23: 5 [IU] via SUBCUTANEOUS
  Administered 2018-08-24: 3 [IU] via SUBCUTANEOUS
  Administered 2018-08-24: 5 [IU] via SUBCUTANEOUS
  Administered 2018-08-25: 09:00:00 2 [IU] via SUBCUTANEOUS
  Filled 2018-08-23 (×7): qty 1

## 2018-08-23 MED ORDER — ENOXAPARIN SODIUM 40 MG/0.4ML ~~LOC~~ SOLN
40.0000 mg | SUBCUTANEOUS | Status: DC
  Administered 2018-08-23 – 2018-08-24 (×2): 40 mg via SUBCUTANEOUS
  Filled 2018-08-23 (×2): qty 0.4

## 2018-08-23 MED ORDER — LORATADINE 10 MG PO TABS
10.0000 mg | ORAL_TABLET | Freq: Every day | ORAL | Status: DC
  Administered 2018-08-23 – 2018-08-25 (×3): 10 mg via ORAL
  Filled 2018-08-23 (×3): qty 1

## 2018-08-23 MED ORDER — SODIUM CHLORIDE 0.9 % IV SOLN
2.0000 g | Freq: Two times a day (BID) | INTRAVENOUS | Status: DC
  Administered 2018-08-23 – 2018-08-25 (×4): 2 g via INTRAVENOUS
  Filled 2018-08-23 (×6): qty 2

## 2018-08-23 MED ORDER — LACTATED RINGERS IV SOLN
INTRAVENOUS | Status: DC
  Administered 2018-08-23 – 2018-08-24 (×2): via INTRAVENOUS

## 2018-08-23 MED ORDER — ORAL CARE MOUTH RINSE: 15.0000 mL | Freq: Two times a day (BID) | OROMUCOSAL | Status: DC

## 2018-08-23 MED ORDER — BUDESONIDE 0.25 MG/2ML IN SUSP
0.2500 mg | Freq: Two times a day (BID) | RESPIRATORY_TRACT | Status: DC
  Administered 2018-08-23 – 2018-08-25 (×5): 0.25 mg via RESPIRATORY_TRACT
  Filled 2018-08-23 (×4): qty 2

## 2018-08-23 MED ORDER — ACETAMINOPHEN 325 MG PO TABS: 650.0000 mg | ORAL_TABLET | Freq: Four times a day (QID) | ORAL | Status: DC | PRN

## 2018-08-23 MED ORDER — DOCUSATE SODIUM 100 MG PO CAPS
100.0000 mg | ORAL_CAPSULE | Freq: Two times a day (BID) | ORAL | Status: DC
  Administered 2018-08-23 – 2018-08-25 (×5): 100 mg via ORAL
  Filled 2018-08-23 (×5): qty 1

## 2018-08-23 MED ORDER — MAGNESIUM SULFATE 2 GM/50ML IV SOLN
INTRAVENOUS | Status: AC
  Administered 2018-08-23: 2 g
  Filled 2018-08-23: qty 50

## 2018-08-23 MED ORDER — ASPIRIN EC 81 MG PO TBEC
81.0000 mg | DELAYED_RELEASE_TABLET | Freq: Every day | ORAL | Status: DC
  Administered 2018-08-23 – 2018-08-25 (×3): 81 mg via ORAL
  Filled 2018-08-23 (×3): qty 1

## 2018-08-23 MED ORDER — ACETAMINOPHEN 650 MG RE SUPP: 650.0000 mg | Freq: Four times a day (QID) | RECTAL | Status: DC | PRN

## 2018-08-23 MED ORDER — NITROGLYCERIN 2 % TD OINT
TOPICAL_OINTMENT | TRANSDERMAL | Status: AC
  Administered 2018-08-23: 1 [in_us]
  Filled 2018-08-23: qty 1

## 2018-08-23 MED ORDER — IPRATROPIUM-ALBUTEROL 0.5-2.5 (3) MG/3ML IN SOLN
3.0000 mL | Freq: Once | RESPIRATORY_TRACT | Status: AC
  Administered 2018-08-23: 3 mL via RESPIRATORY_TRACT

## 2018-08-23 MED ORDER — ALBUTEROL SULFATE (2.5 MG/3ML) 0.083% IN NEBU
2.5000 mg | INHALATION_SOLUTION | Freq: Once | RESPIRATORY_TRACT | Status: AC
  Administered 2018-08-23: 2.5 mg via RESPIRATORY_TRACT
  Filled 2018-08-23: qty 3

## 2018-08-23 MED ORDER — VANCOMYCIN HCL IN DEXTROSE 1-5 GM/200ML-% IV SOLN
1000.0000 mg | INTRAVENOUS | Status: DC
  Filled 2018-08-23: qty 200

## 2018-08-23 MED ORDER — FUROSEMIDE 10 MG/ML IJ SOLN
40.0000 mg | Freq: Once | INTRAMUSCULAR | Status: AC
  Administered 2018-08-23: 40 mg via INTRAVENOUS
  Filled 2018-08-23: qty 4

## 2018-08-23 MED ORDER — AMLODIPINE BESYLATE 10 MG PO TABS
10.0000 mg | ORAL_TABLET | Freq: Every day | ORAL | Status: DC
  Administered 2018-08-23 – 2018-08-25 (×3): 10 mg via ORAL
  Filled 2018-08-23: qty 2
  Filled 2018-08-23: qty 1
  Filled 2018-08-23: qty 2

## 2018-08-23 MED ORDER — ALBUTEROL SULFATE (2.5 MG/3ML) 0.083% IN NEBU: 2.5000 mg | INHALATION_SOLUTION | RESPIRATORY_TRACT | Status: DC | PRN

## 2018-08-23 MED ORDER — ONDANSETRON HCL 4 MG/2ML IJ SOLN
4.0000 mg | Freq: Four times a day (QID) | INTRAMUSCULAR | Status: DC | PRN
  Administered 2018-08-23: 4 mg via INTRAVENOUS
  Filled 2018-08-23: qty 2

## 2018-08-23 MED ORDER — METOPROLOL TARTRATE 50 MG PO TABS
50.0000 mg | ORAL_TABLET | Freq: Two times a day (BID) | ORAL | Status: DC
  Administered 2018-08-23 – 2018-08-25 (×5): 50 mg via ORAL
  Filled 2018-08-23 (×5): qty 1

## 2018-08-23 MED ORDER — FAMOTIDINE 20 MG PO TABS
20.0000 mg | ORAL_TABLET | Freq: Every day | ORAL | Status: DC
  Administered 2018-08-23 – 2018-08-25 (×3): 20 mg via ORAL
  Filled 2018-08-23 (×3): qty 1

## 2018-08-23 NOTE — Progress Notes (Signed)
SLP Cancellation Note  Patient Details Name: Brittney [name] MRN: [id number] DOB: 11/25/1941   Cancelled treatment:       Reason Eval/Treat Not Completed: SLP screened, no needs identified, will sign off(chart reviewed; consulted NSG then met w/ pt ). Pt denied any difficulty swallowing and had eaten her Lunch meal already. Pt is currently on a regular diet; tolerated spaghetti and salad w/out discomfort or decline in respiratory status per pt and NSG. Pt had been on CPAP yesterday but improved quickly even to not needing [place] O2 support currently per NSG report. Pt denied any concerns w/ her swallowing except that she is edentulous and has to take her time w/ tougher meats. Pt is swallowing pills w/ water per NSG. Pt conversed at conversational level w/out deficits noted; pt denied any speech-language deficits.  No further skilled ST services indicated as pt appears at her baseline. Recommended cutting tougher meats for ease of mastication while not having her dentures at the hospital. Pt agreed. NSG to reconsult if any change in status.     Orinda Kenner, MS, CCC-SLP Erbie Arment 08/23/2018, 1:27 PM

## 2018-08-23 NOTE — H&P (Addendum)
Brittney [name] is an 77 y.o. female.   Chief Complaint: Shortness of breath HPI: The patient with past medical history of COPD, hypertension and diabetes presents to the emergency department with shortness of breath.  The patient was recently hospitalized for exacerbation of her COPD and bilateral lower lobe pneumonia.  She reports that she been doing well at home when today she suddenly became short of breath.  She thought her inhalers might help but her breathing quickly worsened.  In the emergency department she was found to have increased work of breathing, hypoxia and hypercapnia which prompted initiation of BiPAP.  Patient now reports that she feels better on noninvasive ventilation.  She was given Solu-Medrol in addition to cefepime and vancomycin prior to the hospital service getting called for further management area  Past Medical History:  Diagnosis Date  . Diabetes mellitus without complication ([place])   . GERD (gastroesophageal reflux disease)   . Hyperlipidemia   . Hypertension   . Vitamin D deficiency     Past Surgical History:  Procedure Laterality Date  . COLONOSCOPY      Family History  Problem Relation Age of Onset  . Diabetes Neg Hx   . Hypertension Neg Hx    Social History:  reports that she has been smoking cigarettes. She has a 40.00 pack-year smoking history. She has never used smokeless tobacco. She reports that she does not drink alcohol or use drugs.  Allergies:  Allergies  Allergen Reactions  . Accupril [Quinapril Hcl]     Medications Prior to Admission  Medication Sig Dispense Refill  . albuterol (PROVENTIL HFA;VENTOLIN HFA) 108 (90 BASE) MCG/ACT inhaler Inhale 2 puffs into the lungs every 6 (six) hours as needed for wheezing or shortness of breath.     Marland Kitchen amLODipine (NORVASC) 10 MG tablet Take 10 mg by mouth daily.    Marland Kitchen aspirin 81 MG tablet Take 81 mg by mouth daily.    Marland Kitchen atorvastatin (LIPITOR) 40 MG tablet Take 1 tablet by mouth daily.    . cetirizine  (ZYRTEC) 10 MG tablet Take 10 mg by mouth daily.     Marland Kitchen glipiZIDE (GLUCOTROL XL) 10 MG 24 hr tablet Take 10 mg by mouth 2 (two) times daily.    . Liraglutide (VICTOZA) 18 MG/3ML SOPN Inject 1.8 mg into the skin every morning.    Marland Kitchen losartan (COZAAR) 100 MG tablet Take 100 mg by mouth daily.    . metFORMIN (GLUCOPHAGE) 1000 MG tablet Take 1,000 mg by mouth 2 (two) times daily with a meal.    . metoprolol tartrate (LOPRESSOR) 50 MG tablet Take 1 tablet (50 mg total) by mouth 2 (two) times daily. 60 tablet 1  . omeprazole (PRILOSEC) 20 MG capsule Take 20 mg by mouth daily.    . ranitidine (ZANTAC) 150 MG tablet Take 1 tablet by mouth 2 (two) times daily.    . sitaGLIPtin (JANUVIA) 100 MG tablet Take 100 mg by mouth daily.      Results for orders placed or performed during the hospital encounter of 08/23/18 (from the past 48 hour(s))  Brain natriuretic peptide     Status: Abnormal   Collection Time: 08/23/18  1:14 AM  Result Value Ref Range   B Natriuretic Peptide 507.0 (H) 0.0 - 100.0 pg/mL    Comment: Performed at [facility], [address] [place], [place] [address]  Comprehensive metabolic panel     Status: Abnormal   Collection Time: 08/23/18  1:14 AM  Result  Value Ref Range   Sodium 136 135 - 145 mmol/L   Potassium 3.8 3.5 - 5.1 mmol/L   Chloride 106 98 - 111 mmol/L   CO2 19 (L) 22 - 32 mmol/L   Glucose, Bld 350 (H) 70 - 99 mg/dL   BUN 20 8 - 23 mg/dL   Creatinine, Ser 1.00 0.44 - 1.00 mg/dL   Calcium 8.6 (L) 8.9 - 10.3 mg/dL   Total Protein 7.6 6.5 - 8.1 g/dL   Albumin 3.5 3.5 - 5.0 g/dL   AST 40 15 - 41 U/L   ALT 105 (H) 0 - 44 U/L   Alkaline Phosphatase 83 38 - 126 U/L   Total Bilirubin 0.7 0.3 - 1.2 mg/dL   GFR calc non Af Amer 53 (L) >60 mL/min   GFR calc Af Amer >60 >60 mL/min    Comment: (NOTE) The eGFR has been calculated using the CKD EPI equation. This calculation has not been validated in all clinical situations. eGFR's persistently <60 mL/min signify  possible Chronic Kidney Disease.    Anion gap 11 5 - 15    Comment: Performed at [facility], [place], [place] [address]  Lactic acid, plasma     Status: Abnormal   Collection Time: 08/23/18  1:14 AM  Result Value Ref Range   Lactic Acid, Venous 4.3 (HH) 0.5 - 1.9 mmol/L    Comment: CRITICAL RESULT CALLED TO, READ BACK BY AND VERIFIED WITH BUTCH WOODS ON 08/23/18 AT 0157 JAG Performed at [facility], [address] [place], [place] [address]   Procalcitonin - Baseline     Status: None   Collection Time: 08/23/18  1:14 AM  Result Value Ref Range   Procalcitonin <0.10 ng/mL    Comment:        Interpretation: PCT (Procalcitonin) <= 0.5 ng/mL: Systemic infection (sepsis) is not likely. Local bacterial infection is possible. (NOTE)       Sepsis PCT Algorithm           Lower Respiratory Tract                                      Infection PCT Algorithm    ----------------------------     ----------------------------         PCT < 0.25 ng/mL                PCT < 0.10 ng/mL         Strongly encourage             Strongly discourage   discontinuation of antibiotics    initiation of antibiotics    ----------------------------     -----------------------------       PCT 0.25 - 0.50 ng/mL            PCT 0.10 - 0.25 ng/mL               OR       >80% decrease in PCT            Discourage initiation of                                            antibiotics      Encourage discontinuation  of antibiotics    ----------------------------     -----------------------------         PCT >= 0.50 ng/mL              PCT 0.26 - 0.50 ng/mL               AND        <80% decrease in PCT             Encourage initiation of                                             antibiotics       Encourage continuation           of antibiotics    ----------------------------     -----------------------------        PCT >= 0.50 ng/mL                  PCT > 0.50 ng/mL                AND         increase in PCT                  Strongly encourage                                      initiation of antibiotics    Strongly encourage escalation           of antibiotics                                     -----------------------------                                           PCT <= 0.25 ng/mL                                                 OR                                        > 80% decrease in PCT                                     Discontinue / Do not initiate                                             antibiotics Performed at [facility], [address] [place], [place] [address]   TSH     Status: Abnormal   Collection Time: 08/23/18  1:14 AM  Result Value Ref Range   TSH 6.465 (H) 0.350 - 4.500 uIU/mL  Comment: Performed by a 3rd Generation assay with a functional sensitivity of <=0.01 uIU/mL. Performed at [facility], [address] [place], [place] [address]   Blood gas, venous     Status: Abnormal   Collection Time: 08/23/18  1:21 AM  Result Value Ref Range   pH, Ven 7.21 (L) 7.250 - 7.430   pCO2, Ven 48 44.0 - 60.0 mmHg   pO2, Ven 50.0 (H) 32.0 - 45.0 mmHg   Bicarbonate 19.2 (L) 20.0 - 28.0 mmol/L   Acid-base deficit 8.8 (H) 0.0 - 2.0 mmol/L   O2 Saturation 75.7 %   Patient temperature 37.0    Collection site VENOUS    Sample type VENOUS     Comment: Performed at [facility], [address], [place], [place] [address]  Blood Culture (routine x 2)     Status: None (Preliminary result)   Collection Time: 08/23/18  1:56 AM  Result Value Ref Range   Specimen Description BLOOD RIGHT ASSIST CONTROL    Special Requests      BOTTLES DRAWN AEROBIC AND ANAEROBIC Blood Culture results may not be optimal due to an excessive volume of blood received in culture bottles   Culture      NO GROWTH < 12 HOURS Performed at [facility], [address] [place], [place] [address]    Report Status PENDING    Blood Culture (routine x 2)     Status: None (Preliminary result)   Collection Time: 08/23/18  2:11 AM  Result Value Ref Range   Specimen Description BLOOD LEFT ASSIST CONTROL    Special Requests      BOTTLES DRAWN AEROBIC AND ANAEROBIC Blood Culture adequate volume   Culture      NO GROWTH < 12 HOURS Performed at [facility], [address], [place], [place] [address]    Report Status PENDING   Glucose, capillary     Status: Abnormal   Collection Time: 08/23/18  3:56 AM  Result Value Ref Range   Glucose-Capillary 332 (H) 70 - 99 mg/dL  MRSA PCR Screening     Status: None   Collection Time: 08/23/18  4:00 AM  Result Value Ref Range   MRSA by PCR NEGATIVE NEGATIVE    Comment:        The GeneXpert MRSA Assay (FDA approved for NASAL specimens only), is one component of a comprehensive MRSA colonization surveillance program. It is not intended to diagnose MRSA infection nor to guide or monitor treatment for MRSA infections. Performed at [facility], [address] [place], [place] [address]   Lactic acid, plasma     Status: Abnormal   Collection Time: 08/23/18  5:10 AM  Result Value Ref Range   Lactic Acid, Venous 3.5 (HH) 0.5 - 1.9 mmol/L    Comment: CRITICAL RESULT CALLED TO, READ BACK BY AND VERIFIED WITH RENEE BABB _0  08/23/18 [facility] Performed at [facility], [address], [place], [place] [address]    Dg Chest Portable 1 View  Result Date: 08/23/2018 CLINICAL DATA:  77 y/o F; respiratory distress. Recent hospital admission for pneumonia. EXAM: PORTABLE CHEST 1 VIEW COMPARISON:  08/10/2018 chest radiograph FINDINGS: Stable normal cardiac silhouette given projection and technique. Calcific aortic atherosclerosis. Bones are unremarkable. Right basilar ill-defined patchy opacification. Increased reticular opacities of the lungs. Small bilateral pleural effusions. No pneumothorax. IMPRESSION: Increased diffuse reticular opacities of the  lungs probably representing interstitial edema. A new patchy opacity at the right lung base  may represent concurrent alveolar edema or pneumonia. Small bilateral effusions. Electronically Signed   By: Kristine Garbe M.D.   On: 08/23/2018 01:57    Review of Systems  Constitutional: Negative for chills and fever.  HENT: Negative for sore throat and tinnitus.   Eyes: Negative for blurred vision and redness.  Respiratory: Positive for cough, shortness of breath and wheezing.   Cardiovascular: Negative for chest pain, palpitations, orthopnea and PND.  Gastrointestinal: Negative for abdominal pain, diarrhea, nausea and vomiting.  Genitourinary: Negative for dysuria, frequency and urgency.  Musculoskeletal: Negative for joint pain and myalgias.  Skin: Negative for rash.       No lesions  Neurological: Negative for speech change, focal weakness and weakness.  Endo/Heme/Allergies: Does not bruise/bleed easily.       No temperature intolerance  Psychiatric/Behavioral: Negative for depression and suicidal ideas.    Blood pressure (!) 114/57, pulse 83, temperature (!) 96.3 F (35.7 C), temperature source Axillary, resp. rate (!) 21, height _0  (1.676 m), weight 76.1 kg, SpO2 99 %. Physical Exam  Vitals reviewed. Constitutional: She is oriented to person, place, and time. She appears well-developed and well-nourished. She appears distressed.  HENT:  Head: Normocephalic and atraumatic.  Mouth/Throat: Oropharynx is clear and moist.  Eyes: Pupils are equal, round, and reactive to light. Conjunctivae and EOM are normal. No scleral icterus.  Neck: Normal range of motion. Neck supple. No JVD present. No tracheal deviation present. No thyromegaly present.  Cardiovascular: Normal rate, regular rhythm and normal heart sounds. Exam reveals no gallop and no friction rub.  No murmur heard. Respiratory: She is in respiratory distress. She has rales.  GI: Soft. Bowel sounds are normal. She exhibits  no distension. There is no tenderness.  Genitourinary:  Genitourinary Comments: Deferred  Musculoskeletal: Normal range of motion. She exhibits no edema.  Lymphadenopathy:    She has no cervical adenopathy.  Neurological: She is alert and oriented to person, place, and time. No cranial nerve deficit. She exhibits normal muscle tone.  Skin: Skin is warm and dry. No rash noted. No erythema.  Psychiatric: She has a normal mood and affect. Her behavior is normal. Judgment and thought content normal.     Assessment/Plan This is a 77 year old female admitted for respiratory failure. 1.  Respiratory failure: Acute on chronic; with hypoxia and hypercapnia.  The patient is ventilating well on BiPAP.  Wean supplemental oxygen as tolerated.  Consider pulse steroids then taper to daily maintenance dose. 2.  Sepsis: The patient meets criteria via tachycardia, tachypnea and elevated lactic acid.  She is hemodynamically stable.  Follow blood cultures growth and sensitivities. 3.  Pneumonia: Healthcare associated; antibiotics as above. 4.  Diabetes mellitus type 2: Hold oral hypoglycemic agents as well as Victoza.  I have started the patient on relatively low dose of long-acting insulin in addition to sliding scale insulin while hospitalized.  May continue DPP 4 5.  Hyperlipidemia: Continue statin therapy 6.  Hypothyroidism: Elevated TSH.  Start Synthroid. 7.  Hypertension: Blood pressure fluctuates dramatically likely due to increased thoracic pressure from positive pressure ventilation and underlying difficult to control hypertension.  Continue metoprolol, losartan and amlodipine. 8.  COPD: Continue inhaled corticosteroid.  Albuterol scheduled.  Steroids as above 9.  DVT prophylaxis: Lovenox 10.  GI prophylaxis: H2 blocker per home regimen The patient is a full code.  Time spent on admission orders and critical care approximately 45 minutes.  Patient discussed with ICU staff.   Harrie Foreman,  MD 08/23/2018, 7:08 AM

## 2018-08-23 NOTE — Consult Note (Addendum)
Name: JOSELINE MCCAMPBELL MRN: [id number] DOB: [birth year]    ADMISSION DATE:  08/23/2018 CONSULTATION DATE: 08/23/2018  REFERRING MD : Dr. Marcille Blanco   CHIEF COMPLAINT: Shortness of Breath  BRIEF PATIENT DESCRIPTION:  77 yo female admitted with acute hypoxic respiratory failure secondary to aspiration pneumonia and CHF exacerbation requiring Bipap   SIGNIFICANT EVENTS/STUDIES:  08/28 Pt admitted to stepdown unit   HISTORY OF PRESENT ILLNESS:   This is a 77 yo female with a PMH of Vitamin D Deficiency, HTN, Hyperlipidemia, Mechanical Intubation, COPD, Acute on Chronic Diastolic CHF,  Emphysema, Bilateral Carotid Bruit, Murmur, Former Smoker, GERD, and Diabetes Mellitus.  She presented to [facility] on [facility]28 from home via EMS with acute onset of shortness of breath. She was discharged from [facility] on 08/16 following treatment of aspiration pneumonitis/flash pulmonary edema and COPD exacerbation.  Upon EMS arrival at pts home her O2 sats were 72%, therefore she was placed on Bipap and O2 sats increased to 86%.  In the ER lab results revealed BNP 507, lactic acid 4.3, serum glucose 350, and venous gas pH 7.21 and CO2 48.  CXR revealed interstitial edema and pneumonia. She remained on Bipap and was subsequently admitted to the stepdown unit by hospitalist team for further workup and treatment.    PAST MEDICAL HISTORY :   has a past medical history of Diabetes mellitus without complication ([place]), GERD (gastroesophageal reflux disease), Hyperlipidemia, Hypertension, and Vitamin D deficiency.  has a past surgical history that includes Colonoscopy. Prior to Admission medications   Medication Sig Start Date End Date Taking? Authorizing Provider  albuterol (PROVENTIL HFA;VENTOLIN HFA) 108 (90 BASE) MCG/ACT inhaler Inhale 2 puffs into the lungs every 6 (six) hours as needed for wheezing or shortness of breath.    Yes [provider]  amLODipine (NORVASC) 10 MG tablet Take 10 mg by mouth daily.   Yes  [provider]  aspirin 81 MG tablet Take 81 mg by mouth daily.   Yes [provider]  atorvastatin (LIPITOR) 40 MG tablet Take 1 tablet by mouth daily. 07/04/18  Yes [provider]  cetirizine (ZYRTEC) 10 MG tablet Take 10 mg by mouth daily.    Yes [provider]  glipiZIDE (GLUCOTROL XL) 10 MG 24 hr tablet Take 10 mg by mouth 2 (two) times daily.   Yes [provider]  Liraglutide (VICTOZA) 18 MG/3ML SOPN Inject 1.8 mg into the skin every morning.   Yes [provider]  losartan (COZAAR) 100 MG tablet Take 100 mg by mouth daily.   Yes [provider]  metFORMIN (GLUCOPHAGE) 1000 MG tablet Take 1,000 mg by mouth 2 (two) times daily with a meal.   Yes [provider]  metoprolol tartrate (LOPRESSOR) 50 MG tablet Take 1 tablet (50 mg total) by mouth 2 (two) times daily. 08/11/18 10/10/18 Yes Sainani, Belia Heman, MD  omeprazole (PRILOSEC) 20 MG capsule Take 20 mg by mouth daily.   Yes [provider]  ranitidine (ZANTAC) 150 MG tablet Take 1 tablet by mouth 2 (two) times daily. 05/06/18  Yes [provider]  sitaGLIPtin (JANUVIA) 100 MG tablet Take 100 mg by mouth daily.   Yes [provider]   Allergies  Allergen Reactions  . Accupril [Quinapril Hcl]     FAMILY HISTORY:  family history is not on file. SOCIAL HISTORY:  reports that she has been smoking cigarettes. She has a 40.00 pack-year smoking history. She has never used smokeless tobacco. She reports that  she does not drink alcohol or use drugs.  REVIEW OF SYSTEMS: Positives in BOLD  Constitutional: Negative for fever, chills, weight loss, malaise/fatigue and diaphoresis.  HENT: Negative for hearing loss, ear pain, nosebleeds, congestion, sore throat, neck pain, tinnitus and ear discharge.   Eyes: Negative for blurred vision, double vision, photophobia, pain, discharge and redness.  Respiratory: cough, hemoptysis, sputum production, shortness  of breath, wheezing and stridor.   Cardiovascular: chest pain, palpitations, orthopnea, claudication, leg swelling and PND.  Gastrointestinal: Negative for heartburn, nausea, vomiting, abdominal pain, diarrhea, constipation, blood in stool and melena.  Genitourinary: Negative for dysuria, urgency, frequency, hematuria and flank pain.  Musculoskeletal: Negative for myalgias, back pain, joint pain and falls.  Skin: Negative for itching and rash.  Neurological: Negative for dizziness, tingling, tremors, sensory change, speech change, focal weakness, seizures, loss of consciousness, weakness and headaches.  Endo/Heme/Allergies: Negative for environmental allergies and polydipsia. Does not bruise/bleed easily.  SUBJECTIVE:  No complaints at this time  VITAL SIGNS: Pulse Rate:  [92-112] 92 (08/28 0217) Resp:  [24-38] 24 (08/28 0217) BP: (132-213)/(62-112) 132/62 (08/28 0217) SpO2:  [86 %-100 %] 99 % (08/28 0217) FiO2 (%):  [50 %] 50 % (08/28 0217) Weight:  [72.6 kg] 72.6 kg (08/28 0118)  PHYSICAL EXAMINATION: General: well developed, well nourished female, NAD on Bipap  Neuro: alert and oriented, follows commands  HEENT: supple, mild JVD  Cardiovascular: nsr, rrr, murmur present, no R/G Lungs: crackles with faint wheezes throughout, even, non labored Abdomen: +BS x4, obese, soft, non tender, non distended  Musculoskeletal: trace bilateral lower extremity edema  Skin: intact no rashes or lesions present   Recent Labs  Lab 08/23/18 0114  NA 136  K 3.8  CL 106  CO2 19*  BUN 20  CREATININE 1.00  GLUCOSE 350*   No results for input(s): HGB, HCT, WBC, PLT in the last 168 hours. Dg Chest Portable 1 View  Result Date: 08/23/2018 CLINICAL DATA:  77 y/o F; respiratory distress. Recent hospital admission for pneumonia. EXAM: PORTABLE CHEST 1 VIEW COMPARISON:  08/10/2018 chest radiograph FINDINGS: Stable normal cardiac silhouette given projection and technique. Calcific aortic  atherosclerosis. Bones are unremarkable. Right basilar ill-defined patchy opacification. Increased reticular opacities of the lungs. Small bilateral pleural effusions. No pneumothorax. IMPRESSION: Increased diffuse reticular opacities of the lungs probably representing interstitial edema. A new patchy opacity at the right lung base may represent concurrent alveolar edema or pneumonia. Small bilateral effusions. Electronically Signed   By: Kristine Garbe M.D.   On: 08/23/2018 01:57    ASSESSMENT / PLAN: Acute hypoxic respiratory failure secondary to interstitial edema and aspiration pneumonia  Hx: COPD and former smoker  Prn Bipap for dyspnea and/or hypoxia Scheduled and prn bronchodilator therapy  Acute on chronic diastolic CHF  HTN and Hyperlipidemia 40 mg IV lasix x1 dose  Continuous telemetry monitoring  Continue outpatient cardiac medications  Cardiology consulted appreciate input   Lactic acidosis  Trend BMP and lactic acid Replace electrolytes as indicated  Monitor UOP   Aspiration pneumonia  Trend WBC and monitor fever curve  Trend PCT  Follow cultures  Continue vancomycin and cefepime for now  Will consult speech therapy to assess pts swallowing prior to starting diet   GERD Continue pepcid  Diabetes Mellitus  Continue tradjenta and lantus  CBG's ac/hs  VTE px: subq lovenox   -Updated pts daughters regarding plan of care and all questions answered.  Marda Stalker, [place] Pager [phone] (please enter 7 digits)  PCCM Consult Pager [phone] (please enter 7 digits)   I agree with the documented

## 2018-08-23 NOTE — Progress Notes (Addendum)
Patient is off BiPAP.  On oxygen by nasal cannula 3 L.  She has better shortness of breath. Signs are stable. A/P: Physical examination done.This is a 77 year old female admitted for respiratory failure. 1.  Respiratory failure: Acute on chronic; with hypoxia and hypercapnia.  The patient is off BiPAP.  Wean supplemental oxygen as tolerated.  Consider pulse steroids then taper to daily maintenance dose. 2.  Sepsis: Continue antibiotics, follow blood cultures growth and sensitivities. 3.  Pneumonia: Healthcare associated; antibiotics as above. 4.  Diabetes mellitus type 2: Hold oral hypoglycemic agents as well as Victoza.  Sliding scale. 5.  Hyperlipidemia: Continue statin therapy 6.  Hypothyroidism: Elevated TSH.  Start Synthroid. 7.  Hypertension: Continue metoprolol, losartan and amlodipine. 8.  COPD: Continue inhaled corticosteroid.  Albuterol scheduled.  Steroids as above Tobacco abuse.  Smoking cessation was counseled for 3 to 4 minutes.  Discussed the patient and her daughter.  Discussed with RN. Time spent about 25 minutes.

## 2018-08-23 NOTE — Consult Note (Signed)
Pharmacy Antibiotic Note  Brittney [name] is a 77 y.o. female admitted on 08/23/2018 with  pneumonia.  Pharmacy has been consulted for Cefepime and Vancomycin dosing. Patient received vancomycin 1g IV and cefepime 2g IV x 1 dose in ED.   Plan: Ke: 0.043    T1/2: 16.12   VD: 50.82  Start Vancomycin 1000 IV every 18 hours with 8 hour stack dosing.  Goal trough 15-20  Mcg/mL. Calculated trough at Css is 17.9. Trough level ordered prior to 4th dose. If MRSA PCR is (-), recommend discontinuation of vancomycin.   Start Cefepime 2g IV every 12 hours based on CrCl <44mL/min.   Height: 5\' 5"  (165.1 cm) Weight: 160 lb (72.6 kg) IBW/kg (Calculated) : 57  No data recorded.  Recent Labs  Lab 08/23/18 0114  CREATININE 1.00  LATICACIDVEN 4.3*    Estimated Creatinine Clearance: 47 mL/min (by C-G formula based on SCr of 1 mg/dL).    Allergies  Allergen Reactions  . Accupril [Quinapril Hcl]     Antimicrobials this admission: 8/28 cefepime >>  8/28 vancomycin  >>   Microbiology results: 8/28 BCx: pending 8/28 MRSA PCR: pending  Thank you for allowing pharmacy to be a part of this patient's care.  Pernell Dupre, PharmD, BCPS Clinical Pharmacist 08/23/2018 3:22 AM

## 2018-08-23 NOTE — Consult Note (Signed)
[facility]  CARDIOLOGY CONSULT NOTE  Patient ID: Brittney [name] MRN: [id number] DOB/AGE: Nov 13, 1941 77 y.o.  Admit date: 08/23/2018 Referring Physician Bridgett Larsson Primary Physician  Primary Cardiologist [facility] Reason for Consultation borderline elevated troponin  HPI: 77 year old female referred for evaluation of borderline elevated troponin.  She is admitted with respiratory failure, COPD exacerbation, possible sepsis and pneumonia.  Labs notable for borderline elevated troponin 0 0.09.  ECG reveals sinus tachycardia without ischemic ST-T wave changes.  The patient denies chest pain.  Patient was recently hospitalized 08/08/2018 with respiratory failure and bilateral pneumonia.  Troponin was 1.75 which is felt to be due to demand supply ischemia.  Echocardiogram 08/06/2018 revealed LVEF of 60 to 65%.  Review of systems complete and found to be negative unless listed above     Past Medical History:  Diagnosis Date  . Diabetes mellitus without complication ([place])   . GERD (gastroesophageal reflux disease)   . Hyperlipidemia   . Hypertension   . Vitamin D deficiency     Past Surgical History:  Procedure Laterality Date  . COLONOSCOPY      Medications Prior to Admission  Medication Sig Dispense Refill Last Dose  . albuterol (PROVENTIL HFA;VENTOLIN HFA) 108 (90 BASE) MCG/ACT inhaler Inhale 2 puffs into the lungs every 6 (six) hours as needed for wheezing or shortness of breath.    prn at prn  . amLODipine (NORVASC) 10 MG tablet Take 10 mg by mouth daily.   Past Week at Unknown time  . aspirin 81 MG tablet Take 81 mg by mouth daily.   Past Week at Unknown time  . atorvastatin (LIPITOR) 40 MG tablet Take 1 tablet by mouth daily.   Past Week at Unknown time  . cetirizine (ZYRTEC) 10 MG tablet Take 10 mg by mouth daily.    Past Week at Unknown time  . glipiZIDE (GLUCOTROL XL) 10 MG 24 hr tablet Take 10 mg by mouth 2 (two) times daily.   Past Week at Unknown time  . Liraglutide (VICTOZA)  18 MG/3ML SOPN Inject 1.8 mg into the skin every morning.   Past Week at Unknown time  . losartan (COZAAR) 100 MG tablet Take 100 mg by mouth daily.   Past Week at Unknown time  . metFORMIN (GLUCOPHAGE) 1000 MG tablet Take 1,000 mg by mouth 2 (two) times daily with a meal.   Past Week at Unknown time  . metoprolol tartrate (LOPRESSOR) 50 MG tablet Take 1 tablet (50 mg total) by mouth 2 (two) times daily. 60 tablet 1 Past Week at Unknown time  . omeprazole (PRILOSEC) 20 MG capsule Take 20 mg by mouth daily.   Past Week at Unknown time  . ranitidine (ZANTAC) 150 MG tablet Take 1 tablet by mouth 2 (two) times daily.   Past Week at Unknown time  . sitaGLIPtin (JANUVIA) 100 MG tablet Take 100 mg by mouth daily.   Past Week at Unknown time   Social History   Socioeconomic History  . Marital status: Widowed    Spouse name: Not on file  . Number of children: Not on file  . Years of education: Not on file  . Highest education level: Not on file  Occupational History  . Not on file  Social Needs  . Financial resource strain: Not on file  . Food insecurity:    Worry: Not on file    Inability: Not on file  . Transportation needs:    Medical: Not on file    Non-medical:  Not on file  Tobacco Use  . Smoking status: Current Every Day Smoker    Packs/day: 1.00    Years: 40.00    Pack years: 40.00    Types: Cigarettes  . Smokeless tobacco: Never Used  Substance and Sexual Activity  . Alcohol use: No  . Drug use: No  . Sexual activity: Not on file  Lifestyle  . Physical activity:    Days per week: Not on file    Minutes per session: Not on file  . Stress: Not on file  Relationships  . Social connections:    Talks on phone: Not on file    Gets together: Not on file    Attends religious service: Not on file    Active member of club or organization: Not on file    Attends meetings of clubs or organizations: Not on file    Relationship status: Not on file  . Intimate partner violence:     Fear of current or ex partner: Not on file    Emotionally abused: Not on file    Physically abused: Not on file    Forced sexual activity: Not on file  Other Topics Concern  . Not on file  Social History Narrative  . Not on file    Family History  Problem Relation Age of Onset  . Diabetes Neg Hx   . Hypertension Neg Hx       Review of systems complete and found to be negative unless listed above      PHYSICAL EXAM  General: Well developed, well nourished, in no acute distress HEENT:  Normocephalic and atramatic Neck:  No JVD.  Lungs: Clear bilaterally to auscultation and percussion. Heart: HRRR . Normal S1 and S2 without gallops or murmurs.  Abdomen: Bowel sounds are positive, abdomen soft and non-tender  Msk:  Back normal, normal gait. Normal strength and tone for age. Extremities: No clubbing, cyanosis or edema.   Neuro: Alert and oriented X 3. Psych:  Good affect, responds appropriately  Labs:   Lab Results  Component Value Date   WBC 13.4 (H) 08/10/2018   HGB 11.6 (L) 08/10/2018   HCT 34.3 (L) 08/10/2018   MCV 81.5 08/10/2018   PLT 332 08/10/2018    Recent Labs  Lab 08/23/18 0114  NA 136  K 3.8  CL 106  CO2 19*  BUN 20  CREATININE 1.00  CALCIUM 8.6*  PROT 7.6  BILITOT 0.7  ALKPHOS 83  ALT 105*  AST 40  GLUCOSE 350*   Lab Results  Component Value Date   TROPONINI 0.09 ([place]) 08/23/2018   No results found for: CHOL No results found for: HDL No results found for: LDLCALC No results found for: TRIG No results found for: CHOLHDL No results found for: LDLDIRECT    Radiology: Dg Abd 1 View  Result Date: 08/07/2018 CLINICAL DATA:  NG tube placement EXAM: ABDOMEN - 1 VIEW COMPARISON:  08/07/2018 FINDINGS: NG tube has been advanced with the tip in the distal stomach. Nonobstructive bowel gas pattern. IMPRESSION: NG tube tip in the distal stomach. Electronically Signed   By: Rolm Baptise M.D.   On: 08/07/2018 19:01   Dg Abd 1 View  Result Date:  08/07/2018 CLINICAL DATA:  NG tube placement. EXAM: ABDOMEN - 1 VIEW COMPARISON:  Radiograph dated 08/05/2018 FINDINGS: The tip of the NG tube is in the distal esophagus just proximal to the gastroesophageal junction. The visualized bowel gas pattern is normal. Diffuse accentuation of the interstitial  markings of both lungs with Kerley B-lines suggesting interstitial edema are noted. IMPRESSION: NG tube tip at the gastroesophageal junction. Interstitial pulmonary edema, unchanged. Electronically Signed   By: Lorriane Shire M.D.   On: 08/07/2018 14:59   Dg Abd 1 View  Result Date: 08/05/2018 CLINICAL DATA:  Evaluate NG tube EXAM: ABDOMEN - 1 VIEW COMPARISON:  August 05, 2018 FINDINGS: The distal tip of the NG tube is in the gastric fundus. The side port is near the GE junction. IMPRESSION: The side port of the NG tube is near the GE junction. Consider advancement. Electronically Signed   By: Dorise Bullion III M.D   On: 08/05/2018 20:41   Ct Angio Chest Pe W And/or Wo Contrast  Result Date: 08/05/2018 CLINICAL DATA:  Respiratory distress, diaphoresis and lethargy. Intubated. Acute, generalized abdominal pain. EXAM: CT ANGIOGRAPHY CHEST CT ABDOMEN AND PELVIS WITH CONTRAST TECHNIQUE: Multidetector CT imaging of the chest was performed using the standard protocol during bolus administration of intravenous contrast. Multiplanar CT image reconstructions and MIPs were obtained to evaluate the vascular anatomy. Multidetector CT imaging of the abdomen and pelvis was performed using the standard protocol during bolus administration of intravenous contrast. CONTRAST:  107mL ISOVUE-370 IOPAMIDOL (ISOVUE-370) INJECTION 76% COMPARISON:  None. FINDINGS: CTA CHEST FINDINGS Cardiovascular: Normally opacified pulmonary arteries with no pulmonary arterial filling defects seen. Atheromatous calcifications, including the coronary arteries and aorta. Borderline enlarged heart. No pericardial effusion. Mediastinum/Nodes: Mildly  prominent precarinal node with a short axis diameter of 10 mm on image number 40 series 4. Mildly enlarged right hilar nodes, the largest with a short axis diameter of 11 mm on image number 54 series 4. Enlarged left hilar nodes, the largest with a short axis diameter of 10 mm on image number 52 series 4. Endotracheal 2 tip 2.7 cm above the carina. Nasogastric tube extending into the stomach. Lungs/Pleura: Dense, patchy and confluent airspace opacity in both lower lobes. Diffusely prominent interstitial markings with septal edema. Small bilateral pleural effusions. Musculoskeletal: Thoracic and lower cervical spine degenerative changes. Review of the MIP images confirms the above findings. CT ABDOMEN and PELVIS FINDINGS Hepatobiliary: Multiple gallstones in the gallbladder measuring up to 2.0 cm in maximum diameter each. No gallbladder wall thickening or pericholecystic fluid. Normal appearing liver. Pancreas: Unremarkable. No pancreatic ductal dilatation or surrounding inflammatory changes. Spleen: Normal in size without focal abnormality. Adrenals/Urinary Tract: Small bilateral renal cysts. Normal appearing adrenal glands and ureters. Foley catheter in the urinary bladder with associated air in the bladder. Stomach/Bowel: Single distal descending colon diverticulum. Nasogastric tube tip in the mid to proximal stomach. Unremarkable small bowel. No evidence of appendicitis. Vascular/Lymphatic: Atheromatous arterial calcifications without aneurysm. No enlarged lymph nodes. Reproductive: Normal sized uterus with multiple small peripheral calcifications. No adnexal mass. Other: No abdominal wall hernia or abnormality. No abdominopelvic ascites. Musculoskeletal: Lumbar and lower thoracic spine degenerative changes. Review of the MIP images confirms the above findings. IMPRESSION: 1. No pulmonary emboli. 2. Dense, patchy and confluent airspace opacity in both lower lobes. This is most likely due to pneumonia. Patchy  atelectasis is less likely. 3. Borderline cardiomegaly with changes of acute congestive heart failure. 4. Mild mediastinal and bilateral hilar adenopathy, most likely reactive. 5. Cholelithiasis without evidence cholecystitis. 6.  Calcific coronary artery and aortic atherosclerosis. Aortic Atherosclerosis (ICD10-I70.0). Electronically Signed   By: Claudie Revering M.D.   On: 08/05/2018 15:40   Ct Abdomen Pelvis W Contrast  Result Date: 08/05/2018 CLINICAL DATA:  Respiratory distress, diaphoresis and lethargy. Intubated.  Acute, generalized abdominal pain. EXAM: CT ANGIOGRAPHY CHEST CT ABDOMEN AND PELVIS WITH CONTRAST TECHNIQUE: Multidetector CT imaging of the chest was performed using the standard protocol during bolus administration of intravenous contrast. Multiplanar CT image reconstructions and MIPs were obtained to evaluate the vascular anatomy. Multidetector CT imaging of the abdomen and pelvis was performed using the standard protocol during bolus administration of intravenous contrast. CONTRAST:  27mL ISOVUE-370 IOPAMIDOL (ISOVUE-370) INJECTION 76% COMPARISON:  None. FINDINGS: CTA CHEST FINDINGS Cardiovascular: Normally opacified pulmonary arteries with no pulmonary arterial filling defects seen. Atheromatous calcifications, including the coronary arteries and aorta. Borderline enlarged heart. No pericardial effusion. Mediastinum/Nodes: Mildly prominent precarinal node with a short axis diameter of 10 mm on image number 40 series 4. Mildly enlarged right hilar nodes, the largest with a short axis diameter of 11 mm on image number 54 series 4. Enlarged left hilar nodes, the largest with a short axis diameter of 10 mm on image number 52 series 4. Endotracheal 2 tip 2.7 cm above the carina. Nasogastric tube extending into the stomach. Lungs/Pleura: Dense, patchy and confluent airspace opacity in both lower lobes. Diffusely prominent interstitial markings with septal edema. Small bilateral pleural effusions.  Musculoskeletal: Thoracic and lower cervical spine degenerative changes. Review of the MIP images confirms the above findings. CT ABDOMEN and PELVIS FINDINGS Hepatobiliary: Multiple gallstones in the gallbladder measuring up to 2.0 cm in maximum diameter each. No gallbladder wall thickening or pericholecystic fluid. Normal appearing liver. Pancreas: Unremarkable. No pancreatic ductal dilatation or surrounding inflammatory changes. Spleen: Normal in size without focal abnormality. Adrenals/Urinary Tract: Small bilateral renal cysts. Normal appearing adrenal glands and ureters. Foley catheter in the urinary bladder with associated air in the bladder. Stomach/Bowel: Single distal descending colon diverticulum. Nasogastric tube tip in the mid to proximal stomach. Unremarkable small bowel. No evidence of appendicitis. Vascular/Lymphatic: Atheromatous arterial calcifications without aneurysm. No enlarged lymph nodes. Reproductive: Normal sized uterus with multiple small peripheral calcifications. No adnexal mass. Other: No abdominal wall hernia or abnormality. No abdominopelvic ascites. Musculoskeletal: Lumbar and lower thoracic spine degenerative changes. Review of the MIP images confirms the above findings. IMPRESSION: 1. No pulmonary emboli. 2. Dense, patchy and confluent airspace opacity in both lower lobes. This is most likely due to pneumonia. Patchy atelectasis is less likely. 3. Borderline cardiomegaly with changes of acute congestive heart failure. 4. Mild mediastinal and bilateral hilar adenopathy, most likely reactive. 5. Cholelithiasis without evidence cholecystitis. 6.  Calcific coronary artery and aortic atherosclerosis. Aortic Atherosclerosis (ICD10-I70.0). Electronically Signed   By: Claudie Revering M.D.   On: 08/05/2018 15:40   US Renal  Result Date: 08/06/2018 CLINICAL DATA:  Acute kidney injury EXAM: RENAL / URINARY TRACT ULTRASOUND COMPLETE COMPARISON:  None. FINDINGS: Right Kidney: Length: 11.7 cm.  Echogenicity within normal limits. No mass or hydronephrosis visualized. Left Kidney: Length: 11.8 cm. Trace amount of perinephric fluid. Echogenicity within normal limits. No mass or hydronephrosis visualized. Bladder: Decompressed bladder with a Foley catheter present. Other: No focal lesion identified. Increased hepatic parenchymal echogenicity. IMPRESSION: 1. No obstructive uropathy.  Trace amount of left perinephric fluid. Electronically Signed   By: Kathreen Devoid   On: 08/06/2018 15:40   Dg Chest Portable 1 View  Result Date: 08/23/2018 CLINICAL DATA:  77 y/o F; respiratory distress. Recent hospital admission for pneumonia. EXAM: PORTABLE CHEST 1 VIEW COMPARISON:  08/10/2018 chest radiograph FINDINGS: Stable normal cardiac silhouette given projection and technique. Calcific aortic atherosclerosis. Bones are unremarkable. Right basilar ill-defined patchy opacification. Increased reticular opacities of  the lungs. Small bilateral pleural effusions. No pneumothorax. IMPRESSION: Increased diffuse reticular opacities of the lungs probably representing interstitial edema. A new patchy opacity at the right lung base may represent concurrent alveolar edema or pneumonia. Small bilateral effusions. Electronically Signed   By: Kristine Garbe M.D.   On: 08/23/2018 01:57   Dg Chest Port 1 View  Result Date: 08/10/2018 CLINICAL DATA:  Respiratory failure EXAM: PORTABLE CHEST 1 VIEW COMPARISON:  August 09, 2018 FINDINGS: Stable right PICC line. The NG tube is been removed. Cardiomediastinal silhouette is stable. Diffuse interstitial opacities have improved in the interval. Possible tiny right effusion. No other interval change. IMPRESSION: 1. Support apparatus as above. 2. Mild but improving edema.  Probable tiny right effusion. Electronically Signed   By: Dorise Bullion III M.D   On: 08/10/2018 07:31   Dg Chest Port 1 View  Result Date: 08/09/2018 CLINICAL DATA:  Respiratory failure. EXAM: PORTABLE  CHEST 1 VIEW COMPARISON:  Radiograph of August 08, 2018. FINDINGS: Stable cardiomediastinal silhouette with central pulmonary vascular congestion is noted. Atherosclerosis of thoracic aorta is noted. Endotracheal tube has been removed. Nasogastric tube is seen entering the stomach. Right-sided PICC line is unchanged in position. No pneumothorax or pleural effusion is noted. Stable bilateral pulmonary edema is noted. Bony thorax is unremarkable. IMPRESSION: Stable central pulmonary vascular congestion is noted and bilateral pulmonary edema. Endotracheal tube has been removed. Aortic Atherosclerosis (ICD10-I70.0). Electronically Signed   By: Marijo Conception, M.D.   On: 08/09/2018 07:11   Portable Chest Xray  Result Date: 08/08/2018 CLINICAL DATA:  Intubation. EXAM: PORTABLE CHEST 1 VIEW COMPARISON:  08/07/2018. FINDINGS: NG tube has been uncoiled and repositioned. NG tube tip below left hemidiaphragm. NG tube, right PICC line in stable position. Mild cardiomegaly with bilateral interstitial prominence. Small right-sided pleural effusion. Findings suggest mild CHF. Pneumonitis cannot be excluded. No pneumothorax. IMPRESSION: 1. NG tube has been uncoiled and repositioned. Its tip is below left hemidiaphragm. Endotracheal tube and right PICC line stable position. 2 cardiomegaly with bilateral interstitial prominence and small right pleural effusions suggesting mild CHF. Pneumonitis cannot be excluded. Electronically Signed   By: Marcello Moores  Register   On: 08/08/2018 06:50   Dg Chest Port 1 View  Result Date: 08/07/2018 CLINICAL DATA:  PICC line placement EXAM: PORTABLE CHEST 1 VIEW COMPARISON:  08/07/2018 FINDINGS: 1759 hours. Diffuse pulmonary edema pattern persists in the lungs. Small bilateral pleural effusions evident, right greater than left. Cardiopericardial silhouette is at upper limits of normal for size. Endotracheal tube tip is approximately 2 cm above the base of the carina. Right PICC line tip is  positioned over the distal SVC. NG tube tip is in the proximal stomach with proximal port of the NG tube above the EG junction. NG tube remains coiled in the hypopharynx and proximal esophagus. IMPRESSION: 1. NG tube remains coiled in the hypopharynx and proximal esophagus. Tube should be withdrawn and repositioned for appropriate placement. 2. Right PICC line tip overlies the distal SVC. 3. Stable pulmonary edema pattern with small bilateral pleural effusions. These results will be called to the ordering clinician or representative by the Radiologist Assistant, and communication documented in the PACS or zVision Dashboard. Electronically Signed   By: Misty Stanley M.D.   On: 08/07/2018 18:21   Portable Chest X-ray  Result Date: 08/07/2018 CLINICAL DATA:  Endotracheal tube placement. NG tube placement. Pulmonary edema. EXAM: PORTABLE CHEST 1 VIEW 2:28 p.m. COMPARISON:  Chest x-ray dated 08/07/2018 at 1:36 p.m. and chest x-rays  dated 08/06/2018 and 08/05/2018 FINDINGS: Endotracheal tube tip is 5 cm above the carina. NG tube tip is at the gastroesophageal junction and needs to be advanced. NG tube is looped in the cervical esophagus. Bilateral interstitial pulmonary edema has improved. Small right pleural effusion. Heart size is normal. Slight pulmonary vascular prominence. Aortic atherosclerosis. No acute bone abnormality. IMPRESSION: 1. Endotracheal tube in good position. 2. NG tube tip is at the gastroesophageal junction. The NG tube is looped in the cervical esophagus. 3. Improved bilateral interstitial pulmonary edema. Small residual right effusion. No appreciable left effusion. 4.  Aortic Atherosclerosis (ICD10-I70.0). Electronically Signed   By: Lorriane Shire M.D.   On: 08/07/2018 15:01   Dg Chest Port 1 View  Result Date: 08/07/2018 CLINICAL DATA:  77 year old female with respiratory failure, pulmonary edema and/or pneumonia on recent chest CTA. EXAM: PORTABLE CHEST 1 VIEW COMPARISON:  0527 hours  today and earlier. FINDINGS: Portable AP semi upright view at 1336 hours. Cardiac and mediastinal contours remain normal. Calcified aortic atherosclerosis. Visualized tracheal air column is within normal limits. The patient has been extubated and the enteric tube removed. Acutely increased diffuse pulmonary interstitial opacity, although lung volumes are stable to mildly larger. Small bilateral pleural effusions suspected. No pneumothorax. The confluent lower lung airspace disease may persist in the form of retrocardiac opacity. Paucity of upper abdominal bowel gas. No acute osseous abnormality identified. IMPRESSION: 1. Extubated and enteric tube removed. 2. New bilateral pulmonary interstitial opacity favored to reflect acute pulmonary edema. 3. Confluent bilateral lower lobe airspace disease seen on 08/10 19 May persist. Suspected small bilateral pleural effusions Electronically Signed   By: Genevie Ann M.D.   On: 08/07/2018 14:03   Dg Chest Port 1 View  Result Date: 08/07/2018 CLINICAL DATA:  Pneumonia EXAM: PORTABLE CHEST 1 VIEW COMPARISON:  08/06/2018 and prior radiographs FINDINGS: An endotracheal tube with tip 4.2 cm above the carina and NG tube entering the stomach again noted. Mild bilateral interstitial opacities and LOWER lung consolidation/atelectasis again noted. There is no evidence of pneumothorax or large pleural effusion. IMPRESSION: Slight advancement of endotracheal tube, otherwise unchanged appearance of the chest. Electronically Signed   By: Margarette Canada M.D.   On: 08/07/2018 07:16   Dg Chest Port 1 View  Result Date: 08/06/2018 CLINICAL DATA:  Pneumonia, abnormal blood gas EXAM: PORTABLE CHEST 1 VIEW COMPARISON:  08/05/2018 FINDINGS: Endotracheal tube terminates 6.5 cm above the carina. Enteric tube courses into the proximal gastric body. Mild left basilar atelectasis. Possible small left pleural effusion. Right lung is essentially clear. No pneumothorax. The heart is normal in size.  Thoracic aortic atherosclerosis. IMPRESSION: Endotracheal tube terminates 6.5 cm above the carina. Mild left basilar atelectasis with possible small left pleural effusion. Electronically Signed   By: Julian Hy M.D.   On: 08/06/2018 08:08   Dg Chest Port 1 View  Result Date: 08/05/2018 CLINICAL DATA:  ET and OG tube placement EXAM: PORTABLE CHEST 1 VIEW COMPARISON:  August 05, 2018 FINDINGS: Bilateral primarily interstitial pulmonary infiltrates remain. This was thought to represent pneumonia on the CT scan from earlier today. No pneumothorax. The ETT terminates in the mid trachea. The NG tube terminates with the side port near the GE junction in the distal tip in the stomach. The heart size is borderline. The hila and mediastinum are unremarkable. No pulmonary nodules or masses. IMPRESSION: 1. The ETT is in good position. 2. The side port of the NG tube is near the GE junction. Consider advancement. 3.  Interstitial pulmonary infiltrates remain. This was thought to represent pneumonia on today's CT scan. Electronically Signed   By: Dorise Bullion III M.D   On: 08/05/2018 20:40   Dg Chest Port 1 View  Result Date: 08/05/2018 CLINICAL DATA:  Patient poor historian at this time. Unable to obtain hx at this time. Encounter for SOB. EXAM: PORTABLE CHEST - 1 VIEW COMPARISON:  02/16/2015 FINDINGS: Moderate interstitial edema or infiltrates and central pulmonary vascular congestion, new since previous. Heart size upper limits normal. Aortic Atherosclerosis (ICD10-170.0). No effusion.  No pneumothorax. Visualized bones unremarkable. IMPRESSION: 1. Moderate bilateral interstitial edema or infiltrates, new since previous. Electronically Signed   By: Lucrezia Europe M.D.   On: 08/05/2018 14:25   Dg Abd Portable 1v  Result Date: 08/05/2018 CLINICAL DATA:  NG tube placement. EXAM: PORTABLE ABDOMEN - 1 VIEW COMPARISON:  Current abdomen pelvis CT. FINDINGS: Nasogastric tube extends below the diaphragm with its tip in  the proximal stomach. Side hole of the tube lies in the distal esophagus. IMPRESSION: NG tube tip in the proximal stomach. Electronically Signed   By: Lajean Manes M.D.   On: 08/05/2018 19:26   [place] Ekg Site Rite  Result Date: 08/07/2018 If Site Rite image not attached, placement could not be confirmed due to current cardiac rhythm.   EKG: Sinus tachycardia  ASSESSMENT AND PLAN:   1.  Borderline elevated troponin, in the absence of chest pain or ECG changes, in the setting of respiratory failure, COPD exacerbation, sepsis/pneumonia, likely demand supply ischemia, not due to acute coronary syndrome 2.  Respiratory failure/sepsis/pneumonia/COPD exacerbation  Recommendations  1.  Agree with current therapy 2.  Defer full dose anticoagulation 3.  Defer further cardiac diagnostics at this time  Sign off for now, please call if any questions  Signed: Isaias Cowman MD,PhD, [facility] 08/23/2018, 9:06 AM

## 2018-08-23 NOTE — Progress Notes (Signed)
Advanced Care Plan.  Purpose of Encounter: CODE STATUS. Parties in Attendance: The patient, her daughter, RNs and me. Patient's Decisional Capacity: Yes. Medical Story: Brittney [name] is an 77 y.o. female with history of respiratory failure, pneumonia, COPD, hypertension, hyperlipidemia, GERD.  The patient is admitted for acute on chronic respiratory failure with hypoxia due to sepsis and HAP.  She is put on BiPAP and on oxygen now.  I discussed with the patient about her current condition, prognosis and CODE STATUS.  The patient said she want to be resuscitated and intubated if necessary.  This is confirmed with her daughter.  Plan:  Code Status: Full code. Time spent discussing advance care planning: 17 minutes.

## 2018-08-23 NOTE — Progress Notes (Signed)
Inpatient Diabetes Program Recommendations  AACE/ADA:  Consensus Statement on Inpatient Glycemic Control (2015)  Target Ranges:  Prepandial:   less than 140 mg/dL      Peak postprandial:   less than 180 mg/dL (1-2 hours)      Critically ill patients:  140 - 180 mg/dL    Results for Brittney, [name] (MRN [id number]) as of 08/23/2018 13:19  Ref. Range 08/23/2018 03:56 08/23/2018 07:33 08/23/2018 11:28  Glucose-Capillary Latest Ref Range: 70 - 99 mg/dL 332 (H) 368 (H) 307 (H)     Results for Brittney, [name] (MRN [id number]) as of 08/23/2018 13:19  Ref. Range 08/06/2018 11:50  Hemoglobin A1C Latest Ref Range: 4.8 - 5.6 % 7.3 (H)    Admit with: respiratory failure  Hx: DM2/COPD/HTN  Home DM meds: Januvia 10 mg/day                              Metformin 1000 mg/BID                              Victoza 1.8 mg/daily                              Glipizide 10 mg/daily   Current DM meds: Lantus 12 units daily at bedtime                                 Tradjenta 5 mg daily                                 Novolog sensitive scale (0-9 units) tid ac   CBG still quite high today. Receiving coverage via correction scale.  Pt did receive 125 mg of Solumedrol early this am at 0130.  MD - Please note the following inpatient insulin adjustment recommendations:  1. Change Lantus 12 unit dose to daily therefore patient may receive now as CBG 307       (instead of waiting until 2200 when first hs dose ordered to begin).  2. Increase Novolog to moderate (0-15 unit) scale.  Thank you.  -- Will follow during hospitalization.--  Jonna Clark RN, MSN Diabetes Coordinator Inpatient Glycemic Control Team Team Pager: [phone] (8am-5pm)

## 2018-08-23 NOTE — ED Notes (Signed)
Dr Owens Shark made aware at this time of pt/s elevated Lactic Acid level as reported by lab: Lactic Acid 4.3 mmol/L.

## 2018-08-23 NOTE — Progress Notes (Signed)
CODE SEPSIS - PHARMACY COMMUNICATION  **Broad Spectrum Antibiotics should be administered within 1 hour of Sepsis diagnosis**  Time Code Sepsis Called/Page Received: @ 0139  Antibiotics Ordered: Cefepime 2g                                      Vancomycin 1g  Time of 1st antibiotic administration: @ 0215  Additional action taken by pharmacy: NA  If necessary, Name of Provider/Nurse Contacted: NA   Pernell Dupre, PharmD, BCPS Clinical Pharmacist 08/23/2018 2:17 AM

## 2018-08-23 NOTE — Progress Notes (Signed)
Transported pt to ICU from ER on Bipap without incident.

## 2018-08-23 NOTE — Progress Notes (Signed)
Pt remains off bipap per Luis Abed np

## 2018-08-23 NOTE — Progress Notes (Signed)
ED visit. Brittney [name] is struggling with shortness of breath after a recent hospital stay for pneumonia.Four daughters present. Listening presence and prayer offered with Brittney [name] and daughters.

## 2018-08-23 NOTE — ED Triage Notes (Addendum)
Pt arrives to ED via [facility] "Emergency Traffic" from home with c/o respiratory distress. EMS reports pt released from [facility] 1 week ago after admission for pneumonia. EMS states pt's initial RA O2 sats were 72%, improved to 86% on BiPap. Per EMS, pt does not use home O2. Dr Owens Shark at bedside upon pt's arrival.

## 2018-08-24 ENCOUNTER — Inpatient Hospital Stay: Payer: Medicare HMO

## 2018-08-24 LAB — CBC WITH DIFFERENTIAL/PLATELET
Basophils Absolute: 0.1 10*3/uL (ref 0–0.1)
Basophils Relative: 0 %
Eosinophils Absolute: 0 10*3/uL (ref 0–0.7)
Eosinophils Relative: 0 %
HCT: 30.3 % — ABNORMAL LOW (ref 35.0–47.0)
Hemoglobin: 10.2 g/dL — ABNORMAL LOW (ref 12.0–16.0)
Lymphocytes Relative: 13 %
Lymphs Abs: 2 10*3/uL (ref 1.0–3.6)
MCH: 27.6 pg (ref 26.0–34.0)
MCHC: 33.5 g/dL (ref 32.0–36.0)
MCV: 82.4 fL (ref 80.0–100.0)
Monocytes Absolute: 1.4 10*3/uL — ABNORMAL HIGH (ref 0.2–0.9)
Monocytes Relative: 9 %
Neutro Abs: 11.9 10*3/uL — ABNORMAL HIGH (ref 1.4–6.5)
Neutrophils Relative %: 78 %
Platelets: 365 10*3/uL (ref 150–440)
RBC: 3.68 MIL/uL — ABNORMAL LOW (ref 3.80–5.20)
RDW: 15.8 % — ABNORMAL HIGH (ref 11.5–14.5)
WBC: 15.4 10*3/uL — ABNORMAL HIGH (ref 3.6–11.0)

## 2018-08-24 LAB — BASIC METABOLIC PANEL
Anion gap: 9 (ref 5–15)
BUN: 29 mg/dL — ABNORMAL HIGH (ref 8–23)
CO2: 24 mmol/L (ref 22–32)
Calcium: 8.8 mg/dL — ABNORMAL LOW (ref 8.9–10.3)
Chloride: 103 mmol/L (ref 98–111)
Creatinine, Ser: 1.11 mg/dL — ABNORMAL HIGH (ref 0.44–1.00)
GFR calc Af Amer: 54 mL/min — ABNORMAL LOW (ref >60)
GFR calc non Af Amer: 47 mL/min — ABNORMAL LOW (ref >60)
Glucose, Bld: 277 mg/dL — ABNORMAL HIGH (ref 70–99)
Potassium: 4.8 mmol/L (ref 3.5–5.1)
Sodium: 136 mmol/L (ref 135–145)

## 2018-08-24 LAB — GLUCOSE, CAPILLARY
Glucose-Capillary: 271 mg/dL — ABNORMAL HIGH (ref 70–99)
Glucose-Capillary: 254 mg/dL — ABNORMAL HIGH (ref 70–99)
Glucose-Capillary: 246 mg/dL — ABNORMAL HIGH (ref 70–99)
Glucose-Capillary: 288 mg/dL — ABNORMAL HIGH (ref 70–99)

## 2018-08-24 LAB — MAGNESIUM
Magnesium: 2.4 mg/dL (ref 1.7–2.4)
Magnesium: 2.3 mg/dL (ref 1.7–2.4)

## 2018-08-24 LAB — PHOSPHORUS
Phosphorus: 3.5 mg/dL (ref 2.5–4.6)
Phosphorus: 3.8 mg/dL (ref 2.5–4.6)

## 2018-08-24 MED ORDER — INSULIN ASPART 100 UNIT/ML ~~LOC~~ SOLN
0.0000 [IU] | Freq: Every day | SUBCUTANEOUS | Status: DC
  Administered 2018-08-24 (×2): 3 [IU] via SUBCUTANEOUS
  Filled 2018-08-24 (×2): qty 1

## 2018-08-24 MED ORDER — GUAIFENESIN-DM 100-10 MG/5ML PO SYRP
5.0000 mL | ORAL_SOLUTION | ORAL | Status: DC | PRN
  Filled 2018-08-24: qty 5

## 2018-08-24 NOTE — Progress Notes (Signed)
[place] at [place] NAME: Brittney [name]    MR#:  [id number]  DATE OF BIRTH:  1941-11-24  SUBJECTIVE:  CHIEF COMPLAINT:   Chief Complaint  Patient presents with  . Shortness of Breath   Patient feels much better, has cough but no shortness breath.  Off oxygen by nasal cannula. REVIEW OF SYSTEMS:  Review of Systems  Constitutional: Negative for chills, fever and malaise/fatigue.  HENT: Negative for sore throat.   Eyes: Negative for blurred vision and double vision.  Respiratory: Positive for cough. Negative for hemoptysis, shortness of breath, wheezing and stridor.   Cardiovascular: Negative for chest pain, palpitations, orthopnea and leg swelling.  Gastrointestinal: Negative for abdominal pain, blood in stool, diarrhea, melena, nausea and vomiting.  Genitourinary: Negative for dysuria, flank pain and hematuria.  Musculoskeletal: Negative for back pain and joint pain.  Skin: Negative for rash.  Neurological: Negative for dizziness, sensory change, focal weakness, seizures, loss of consciousness, weakness and headaches.  Endo/Heme/Allergies: Negative for polydipsia.  Psychiatric/Behavioral: Negative for depression. The patient is not nervous/anxious.     DRUG ALLERGIES:   Allergies  Allergen Reactions  . Accupril [Quinapril Hcl]    VITALS:  Blood pressure (!) 191/76, pulse 99, temperature 97.8 F (36.6 C), temperature source Oral, resp. rate (!) 29, height 5\' 6"  (1.676 m), weight 76.1 kg, SpO2 90 %. PHYSICAL EXAMINATION:  Physical Exam  Constitutional: She is oriented to person, place, and time. She appears well-developed.  HENT:  Head: Normocephalic.  Mouth/Throat: Oropharynx is clear and moist.  Eyes: Pupils are equal, round, and reactive to light. Conjunctivae and EOM are normal. No scleral icterus.  Neck: Normal range of motion. Neck supple. No JVD present. No tracheal deviation present.  Cardiovascular: Normal rate,  regular rhythm and normal heart sounds. Exam reveals no gallop.  No murmur heard. Pulmonary/Chest: Effort normal and breath sounds normal. No respiratory distress. She has no wheezes. She has no rales.  Abdominal: Soft. Bowel sounds are normal. She exhibits no distension. There is no tenderness. There is no rebound.  Musculoskeletal: Normal range of motion. She exhibits no edema or tenderness.  Neurological: She is alert and oriented to person, place, and time. No cranial nerve deficit.  Skin: No rash noted. No erythema.  Psychiatric: She has a normal mood and affect.   LABORATORY PANEL:  Female CBC Recent Labs  Lab 08/24/18 0106  WBC 15.4*  HGB 10.2*  HCT 30.3*  PLT 365   ------------------------------------------------------------------------------------------------------------------ Chemistries  Recent Labs  Lab 08/23/18 0114  08/24/18 0900  NA 136   < > 136  K 3.8   < > 4.8  CL 106   < > 103  CO2 19*   < > 24  GLUCOSE 350*   < > 277*  BUN 20   < > 29*  CREATININE 1.00   < > 1.11*  CALCIUM 8.6*   < > 8.8*  MG  --    < > 2.3  AST 40  --   --   ALT 105*  --   --   ALKPHOS 83  --   --   BILITOT 0.7  --   --    < > = values in this interval not displayed.   RADIOLOGY:  Dg Chest Port 1 View  Result Date: 08/24/2018 CLINICAL DATA:  Pneumonia EXAM: PORTABLE CHEST 1 VIEW COMPARISON:  08/23/2018 FINDINGS: Cardiac shadow is stable. Aortic calcifications are again seen. There is been  interval clearing in the right lung base. The diffuse interstitial changes have improved as well. No sizable effusion is noted. No focal confluent infiltrate is seen. No bony abnormality is noted. IMPRESSION: Resolution of previously seen right basilar changes as well as the interstitial edema. Electronically Signed   By: Inez Catalina M.D.   On: 08/24/2018 10:24   ASSESSMENT AND PLAN:   This is a 77 year old female admitted forrespiratoryfailure. 1. Acute respiratory failure: Acute on chronic;  with hypoxia and hypercapnia. The patient is off BiPAP and off oxygen.  Continue DuoNeb as needed.  Robitussin as needed.  2. Sepsis: Continue antibiotics, follow blood cultures, so far negative. 3. Pneumonia: Healthcare associated; continue cefepime.    4. Diabetes mellitus type 2: Hold oral hypoglycemic agents as well as Victoza.  Sliding scale. 5. Hyperlipidemia: Continue statin therapy 6. Hypothyroidism: Elevated TSH. StartedSynthroid. 7. Hypertension: Continue metoprolol, losartan and amlodipine. 8. COPD: Continue inhaled corticosteroid. Albuterol prn. Tobacco abuse.  Smoking cessation was counseled for 3 to 4 minutes.  All the records are reviewed and case discussed with Care Management/Social Worker. Management plans discussed with the patient, her daughter and they are in agreement.  CODE STATUS: Full Code  TOTAL TIME TAKING CARE OF THIS PATIENT: 28 minutes.   More than 50% of the time was spent in counseling/coordination of care: YES  POSSIBLE D/C IN 1-2 DAYS, DEPENDING ON CLINICAL CONDITION.   [name] M.D on 08/24/2018 at 3:44 PM  Between 7am to 6pm - Pager - [phone]  After 6pm go to [email or web address] - [facility]

## 2018-08-24 NOTE — Progress Notes (Signed)
Inpatient Diabetes Program Recommendations  AACE/ADA:  Consensus Statement on Inpatient Glycemic Control (2015)  Target Ranges:  Prepandial:   less than 140 mg/dL      Peak postprandial:   less than 180 mg/dL (1-2 hours)      Critically ill patients:  140 - 180 mg/dL     Results for RAISHA, BRABENDER (MRN [id number]) as of 08/24/2018 09:59  Ref. Range 08/23/2018 11:28 08/23/2018 16:05 08/23/2018 22:23 08/24/2018 02:18 08/24/2018 07:26  Glucose-Capillary Latest Ref Range: 70 - 99 mg/dL 307 (H) 299 (H) 326 (H) 271 (H) 254 (H)    CBG continue to be high. Noted last night order obtained to add hs to sensitive correction scale which was given at 0223. First dose of Lantus given last night at 2200. Documented patient eating 80-100% of meals.   Current inpatient DM meds : Lantus 12 units daily at bedtime                                                Novolog sensitive (0-9 units) correction scale and now HS                         Tradjenta 5mg  po daily    MD please consider the following inpatient diabetes adjustments:  1. Increase Lantus to 15 units daily (25% increase)  2. Novolog 4 units tid with meals (meal coverage)  (Please add the following hold parameters:  Hold if NPO ,             Hold if patient eats less than 50% of meal.)  Thank you.   -- Will follow during hospitalization.--  Jonna Clark RN, MSN Diabetes Coordinator Inpatient Glycemic Control Team Team Pager: [phone] (8am-5pm)

## 2018-08-24 NOTE — Progress Notes (Signed)
Patient transferred from ICU to room 130. Patient alert oriented X 4, no c/o pain at this time. Patient was on room air when came to the floor.  Patient is resting comfortably in bed, locked at lower position, bedside table call light and telephone at bedside. Report received from [place], [place]. Will continue to monitor patient.

## 2018-08-24 NOTE — Progress Notes (Signed)
Report called to RN, patient transferred to 130 by NT Megan via wheelchair with belongings.

## 2018-08-25 LAB — CBC
HCT: 33.5 % — ABNORMAL LOW (ref 35.0–47.0)
Hemoglobin: 11.2 g/dL — ABNORMAL LOW (ref 12.0–16.0)
MCH: 27.5 pg (ref 26.0–34.0)
MCHC: 33.3 g/dL (ref 32.0–36.0)
MCV: 82.5 fL (ref 80.0–100.0)
Platelets: 374 10*3/uL (ref 150–440)
RBC: 4.06 MIL/uL (ref 3.80–5.20)
RDW: 15.6 % — ABNORMAL HIGH (ref 11.5–14.5)
WBC: 11.1 10*3/uL — ABNORMAL HIGH (ref 3.6–11.0)

## 2018-08-25 LAB — BASIC METABOLIC PANEL
Anion gap: 10 (ref 5–15)
BUN: 23 mg/dL (ref 8–23)
CO2: 25 mmol/L (ref 22–32)
Calcium: 9.1 mg/dL (ref 8.9–10.3)
Chloride: 105 mmol/L (ref 98–111)
Creatinine, Ser: 0.77 mg/dL (ref 0.44–1.00)
GFR calc Af Amer: >60 mL/min (ref >60)
GFR calc non Af Amer: >60 mL/min (ref >60)
Glucose, Bld: 212 mg/dL — ABNORMAL HIGH (ref 70–99)
Potassium: 4.3 mmol/L (ref 3.5–5.1)
Sodium: 140 mmol/L (ref 135–145)

## 2018-08-25 LAB — CALCIUM, IONIZED
Calcium, Ionized, Serum: 4.9 mg/dL (ref 4.5–5.6)
Calcium, Ionized, Serum: 4.7 mg/dL (ref 4.5–5.6)
Calcium, Ionized, Serum: 4.8 mg/dL (ref 4.5–5.6)

## 2018-08-25 LAB — GLUCOSE, CAPILLARY
Glucose-Capillary: 254 mg/dL — ABNORMAL HIGH (ref 70–99)
Glucose-Capillary: 193 mg/dL — ABNORMAL HIGH (ref 70–99)

## 2018-08-25 LAB — LACTIC ACID, PLASMA: Lactic Acid, Venous: 1.6 mmol/L (ref 0.5–1.9)

## 2018-08-25 MED ORDER — GUAIFENESIN-DM 100-10 MG/5ML PO SYRP: 5.0000 mL | ORAL_SOLUTION | ORAL | 0 refills | Status: DC | PRN

## 2018-08-25 MED ORDER — AMOXICILLIN-POT CLAVULANATE 875-125 MG PO TABS: 1.0000 | ORAL_TABLET | Freq: Two times a day (BID) | ORAL | 0 refills | Status: AC

## 2018-08-25 NOTE — Discharge Instructions (Signed)
Smoking cessation  

## 2018-08-25 NOTE — Discharge Summary (Signed)
[place] at [place] NAME: Brittney [name]    MR#:  [id number]  DATE OF BIRTH:  June 11, 1941  DATE OF ADMISSION:  08/23/2018   ADMITTING PHYSICIAN: Harrie Foreman, MD  DATE OF DISCHARGE: 08/25/2018 11:15 AM  PRIMARY CARE PHYSICIAN: Elisabeth Cara, NP   ADMISSION DIAGNOSIS:  HCAP (healthcare-associated pneumonia) [J18.9] DISCHARGE DIAGNOSIS:  Active Problems:   HCAP (healthcare-associated pneumonia)  SECONDARY DIAGNOSIS:   Past Medical History:  Diagnosis Date  . Diabetes mellitus without complication ([place])   . GERD (gastroesophageal reflux disease)   . Hyperlipidemia   . Hypertension   . Vitamin D deficiency    HOSPITAL COURSE:  This is a 77 year old female admitted forrespiratoryfailure. 1. Acute respiratory failure: Acute on chronic; with hypoxia and hypercapnia. The patient isoffBiPAP and off oxygen.   She has been treated with DuoNeb as needed.  Robitussin as needed.  2. Sepsis:Continue antibiotics, follow blood cultures, so far negative.  3. Pneumonia: Healthcare associated; She has been treated with IV cefepime.    Change to p.o. Augmentin for 5 more days.  Robitussin as needed.  4. Diabetes mellitus type 2: Hold oral hypoglycemic agents as well as Victoza.Sliding scale.  Resume p.o. medication after discharge. 5. Hyperlipidemia: Continue statin therapy 6. Hypothyroidism: Elevated TSH. Follow-up PCP as outpatient. 7. Hypertension: Continue metoprolol, losartan and amlodipine. 8. COPD: Continue inhaled corticosteroid. Albuterol prn. Tobacco abuse. Smoking cessation was counseled for 3 to 4 minutes.  DISCHARGE CONDITIONS:  Stable, discharged to home and resume home health PT. CONSULTS OBTAINED:  Treatment Team:  Pccm, Ander Gaster, MD Isaias Cowman, MD DRUG ALLERGIES:   Allergies  Allergen Reactions  . Accupril [Quinapril Hcl]    DISCHARGE MEDICATIONS:   Allergies as of 08/25/2018       Reactions   Accupril [quinapril Hcl]       Medication List    TAKE these medications   albuterol 108 (90 Base) MCG/ACT inhaler Commonly known as:  PROVENTIL HFA;VENTOLIN HFA Inhale 2 puffs into the lungs every 6 (six) hours as needed for wheezing or shortness of breath.   amLODipine 10 MG tablet Commonly known as:  NORVASC Take 10 mg by mouth daily.   amoxicillin-clavulanate 875-125 MG tablet Commonly known as:  AUGMENTIN Take 1 tablet by mouth 2 (two) times daily for 5 days.   aspirin 81 MG tablet Take 81 mg by mouth daily.   atorvastatin 40 MG tablet Commonly known as:  LIPITOR Take 1 tablet by mouth daily.   cetirizine 10 MG tablet Commonly known as:  ZYRTEC Take 10 mg by mouth daily.   glipiZIDE 10 MG 24 hr tablet Commonly known as:  GLUCOTROL XL Take 10 mg by mouth 2 (two) times daily.   guaiFENesin-dextromethorphan 100-10 MG/5ML syrup Commonly known as:  ROBITUSSIN DM Take 5 mLs by mouth every 4 (four) hours as needed for cough.   losartan 100 MG tablet Commonly known as:  COZAAR Take 100 mg by mouth daily.   metFORMIN 1000 MG tablet Commonly known as:  GLUCOPHAGE Take 1,000 mg by mouth 2 (two) times daily with a meal.   metoprolol tartrate 50 MG tablet Commonly known as:  LOPRESSOR Take 1 tablet (50 mg total) by mouth 2 (two) times daily.   omeprazole 20 MG capsule Commonly known as:  PRILOSEC Take 20 mg by mouth daily.   ranitidine 150 MG tablet Commonly known as:  ZANTAC Take 1 tablet by mouth 2 (two) times daily.   sitaGLIPtin 100  MG tablet Commonly known as:  JANUVIA Take 100 mg by mouth daily.   VICTOZA 18 MG/3ML Sopn Generic drug:  liraglutide Inject 1.8 mg into the skin every morning.        DISCHARGE INSTRUCTIONS:  See AVS. If you experience worsening of your admission symptoms, develop shortness of breath, life threatening emergency, suicidal or homicidal thoughts you must seek medical attention immediately by calling 911 or  calling your MD immediately  if symptoms less severe.  You Must read complete instructions/literature along with all the possible adverse reactions/side effects for all the Medicines you take and that have been prescribed to you. Take any new Medicines after you have completely understood and accpet all the possible adverse reactions/side effects.   Please note  You were cared for by a hospitalist during your hospital stay. If you have any questions about your discharge medications or the care you received while you were in the hospital after you are discharged, you can call the unit and asked to speak with the hospitalist on call if the hospitalist that took care of you is not available. Once you are discharged, your primary care physician will handle any further medical issues. Please note that NO REFILLS for any discharge medications will be authorized once you are discharged, as it is imperative that you return to your primary care physician (or establish a relationship with a primary care physician if you do not have one) for your aftercare needs so that they can reassess your need for medications and monitor your lab values.    On the day of Discharge:  VITAL SIGNS:  Blood pressure (!) 151/59, pulse 85, temperature 97.7 F (36.5 C), temperature source Oral, resp. rate 16, height 5\' 6"  (1.676 m), weight 76.8 kg, SpO2 99 %. PHYSICAL EXAMINATION:  GENERAL:  77 y.o.-year-old patient lying in the bed with no acute distress.  EYES: Pupils equal, round, reactive to light and accommodation. No scleral icterus. Extraocular muscles intact.  HEENT: Head atraumatic, normocephalic. Oropharynx and nasopharynx clear.  NECK:  Supple, no jugular venous distention. No thyroid enlargement, no tenderness.  LUNGS: Normal breath sounds bilaterally, no wheezing, rales,rhonchi or crepitation. No use of accessory muscles of respiration.  CARDIOVASCULAR: S1, S2 normal. No murmurs, rubs, or gallops.  ABDOMEN: Soft,  non-tender, non-distended. Bowel sounds present. No organomegaly or mass.  EXTREMITIES: No pedal edema, cyanosis, or clubbing.  NEUROLOGIC: Cranial nerves II through XII are intact. Muscle strength 5/5 in all extremities. Sensation intact. Gait not checked.  PSYCHIATRIC: The patient is alert and oriented x 3.  SKIN: No obvious rash, lesion, or ulcer.  DATA REVIEW:   CBC Recent Labs  Lab 08/25/18 0835  WBC 11.1*  HGB 11.2*  HCT 33.5*  PLT 374    Chemistries  Recent Labs  Lab 08/23/18 0114  08/24/18 0900 08/25/18 0835  NA 136   < > 136 140  K 3.8   < > 4.8 4.3  CL 106   < > 103 105  CO2 19*   < > 24 25  GLUCOSE 350*   < > 277* 212*  BUN 20   < > 29* 23  CREATININE 1.00   < > 1.11* 0.77  CALCIUM 8.6*   < > 8.8* 9.1  MG  --    < > 2.3  --   AST 40  --   --   --   ALT 105*  --   --   --   ALKPHOS 83  --   --   --  BILITOT 0.7  --   --   --    < > = values in this interval not displayed.     Microbiology Results  Results for orders placed or performed during the hospital encounter of 08/23/18  Blood Culture (routine x 2)     Status: None (Preliminary result)   Collection Time: 08/23/18  1:56 AM  Result Value Ref Range Status   Specimen Description BLOOD RIGHT ASSIST CONTROL  Final   Special Requests   Final    BOTTLES DRAWN AEROBIC AND ANAEROBIC Blood Culture results may not be optimal due to an excessive volume of blood received in culture bottles   Culture   Final    NO GROWTH 2 DAYS Performed at [facility], [address] [place], [place] [address]    Report Status PENDING  Incomplete  Blood Culture (routine x 2)     Status: None (Preliminary result)   Collection Time: 08/23/18  2:11 AM  Result Value Ref Range Status   Specimen Description BLOOD LEFT ASSIST CONTROL  Final   Special Requests   Final    BOTTLES DRAWN AEROBIC AND ANAEROBIC Blood Culture adequate volume   Culture   Final    NO GROWTH 2 DAYS Performed at [facility], [address], [place], [place] [address]    Report Status PENDING  Incomplete  MRSA PCR Screening     Status: None   Collection Time: 08/23/18  4:00 AM  Result Value Ref Range Status   MRSA by PCR NEGATIVE NEGATIVE Final    Comment:        The GeneXpert MRSA Assay (FDA approved for NASAL specimens only), is one component of a comprehensive MRSA colonization surveillance program. It is not intended to diagnose MRSA infection nor to guide or monitor treatment for MRSA infections. Performed at [facility], [address], [place], [place] [address]     RADIOLOGY:  No results found.   Management plans discussed with the patient, her daughters and they are in agreement.  CODE STATUS: Full Code   TOTAL TIME TAKING CARE OF THIS PATIENT: 36 minutes.    [name] M.D on 08/25/2018 at 12:50 PM  Between 7am to 6pm - Pager - [phone]  After 6pm go to [email or web address] - Proofreader  [employer] [facility] [place] [facility]  Office  [phone]  CC: Primary care physician; Elisabeth Cara, NP   Note: This dictation was prepared with Dragon dictation along with smaller phrase technology. Any transcriptional errors that result from this process are unintentional.

## 2018-08-25 NOTE — Progress Notes (Signed)
patient discharged home with family, discharged instruction provided to patient/family, all questions answered and cncerns addressed. Teach back method was used and patient/family was able to verbalize teaching.

## 2018-08-25 NOTE — Plan of Care (Signed)

## 2018-08-25 NOTE — Evaluation (Signed)
Physical Therapy Evaluation Patient Details Name: Brittney [name] MRN: [id number] DOB: 08-09-1941 Today's Date: 08/25/2018   History of Present Illness  Pt is a 77 y.o. female presenting to hospital 08/23/18 with acute onset SOB (recent discharge [facility] 08/11/18 with hospital diagnosis aspiration pneumonitis/flash pulmonary edema and COPD exacerbation).  Pt now admitted with acute hypoxic respiratory failure secondary aspiration PNA and CHF exacerbation requiring Bipap initially.  PMH includes htn, COPD, acute diastolic CHF, DM.  Clinical Impression  Prior to hospital admission, pt was independent with ambulation (used [facility] in community); no recent h/o falls.  Pt lives with her daughter in 1 level home with 4 steps to enter (no railing).  Currently pt is independent with transfers, CGA with ambulating 200 feet no AD (increased B lateral sway noted but pt steady; no loss of balance noted during session); and able to ascend/descend 3 stairs (daughter present for stairs training).  O2 sats 94% or greater on room air during/end of session.  Pt would benefit from skilled PT to address noted impairments and functional limitations (see below for any additional details).  Upon hospital discharge, recommend pt discharge with resumption of HHPT services and support of family as needed.    Follow Up Recommendations Home health PT    Equipment Recommendations  (pt has recommended 3 in 1 already at home)    Recommendations for Other Services       Precautions / Restrictions Precautions Precautions: Fall Restrictions Weight Bearing Restrictions: No      Mobility  Bed Mobility               General bed mobility comments: Deferred (pt sitting on edge of bed beginning/end of session)  Transfers Overall transfer level: Independent Equipment used: None Transfers: Sit to/from Stand Sit to Stand: Independent         General transfer comment: pt using single UE support for sit to stand; steady;  fairly strong stand  Ambulation/Gait Ambulation/Gait assistance: Min guard Gait Distance (Feet): 200 Feet Assistive device: None Gait Pattern/deviations: Step-through pattern Gait velocity: decreased   General Gait Details: increased B lateral sway but overall steady  Stairs Stairs: Yes Stairs assistance: Min guard Stair Management: Forwards;Step to pattern Number of Stairs: 3 General stair comments: pt utilizing railing up 1 step and then hand hold assist up 2 more stairs; utilized single UE support on therapists shoulder descending 3 stairs  Wheelchair Mobility    Modified Rankin (Stroke Patients Only)       Balance Overall balance assessment: Needs assistance Sitting-balance support: No upper extremity supported;Feet supported Sitting balance-Leahy Scale: Normal Sitting balance - Comments: steady sitting reaching outside BOS   Standing balance support: No upper extremity supported Standing balance-Leahy Scale: Good Standing balance comment: steady standing reaching within BOS; no loss of balance with ambulation and head turns R/L                             Pertinent Vitals/Pain Pain Assessment: No/denies pain  HR WFL during session    Home Living Family/patient expects to be discharged to:: Private residence Living Arrangements: Children(Pt's daughter) Available Help at Discharge: Family;Available PRN/intermittently Type of Home: House Home Access: Stairs to enter Entrance Stairs-Rails: None Entrance Stairs-Number of Steps: 4 Home Layout: One level Home Equipment: Walker - 2 wheels;Cane - single point;Bedside commode      Prior Function Level of Independence: Needs assistance   Gait / Transfers Assistance Needed: No AD in  home; uses [facility] in community     Comments: Family has been assisting pt with set-up with showers and other things as needed d/t weakness after last hospitalization.  Pt reports no falls in past 6 months.     Hand Dominance         Extremity/Trunk Assessment   Upper Extremity Assessment Upper Extremity Assessment: Generalized weakness    Lower Extremity Assessment Lower Extremity Assessment: Generalized weakness    Cervical / Trunk Assessment Cervical / Trunk Assessment: Kyphotic  Communication   Communication: No difficulties  Cognition Arousal/Alertness: Awake/alert Behavior During Therapy: WFL for tasks assessed/performed Overall Cognitive Status: Within Functional Limits for tasks assessed                                        General Comments   Nursing cleared pt for participation in physical therapy.  Pt agreeable to PT session.  Pt's family present during session.    Exercises     Assessment/Plan    PT Assessment Patient needs continued PT services  PT Problem List Decreased strength;Decreased activity tolerance;Decreased balance;Decreased mobility       PT Treatment Interventions DME instruction;Gait training;Stair training;Functional mobility training;Therapeutic activities;Therapeutic exercise;Balance training;Patient/family education    PT Goals (Current goals can be found in the Care Plan section)  Acute Rehab PT Goals Patient Stated Goal: to go home PT Goal Formulation: With patient Time For Goal Achievement: 09/08/18 Potential to Achieve Goals: Good    Frequency Min 2X/week   Barriers to discharge        Co-evaluation               AM-PAC PT "6 Clicks" Daily Activity  Outcome Measure Difficulty turning over in bed (including adjusting bedclothes, sheets and blankets)?: None Difficulty moving from lying on back to sitting on the side of the bed? : None Difficulty sitting down on and standing up from a chair with arms (e.g., wheelchair, bedside commode, etc,.)?: None Help needed moving to and from a bed to chair (including a wheelchair)?: None Help needed walking in hospital room?: A Little Help needed climbing 3-5 steps with a railing? : A  Little 6 Click Score: 22    End of Session Equipment Utilized During Treatment: Gait belt Activity Tolerance: Patient tolerated treatment well Patient left: with call bell/phone within reach;with bed alarm set;with family/visitor present(sitting on edge of bed) Nurse Communication: Mobility status;Precautions;Other (comment)(Pt's O2 status during session and pt on room air end of session) PT Visit Diagnosis: Other abnormalities of gait and mobility (R26.89);Muscle weakness (generalized) (M62.81)    Time: 1660-6301 PT Time Calculation (min) (ACUTE ONLY): 20 min   Charges:   PT Evaluation $PT Eval Low Complexity: 1 Low PT Treatments $Therapeutic Activity: 8-22 mins       Leitha Bleak, PT 08/25/18, 9:48 AM [phone]

## 2018-08-28 LAB — CULTURE, BLOOD (ROUTINE X 2)
Culture: NO GROWTH
Culture: NO GROWTH
Special Requests: ADEQUATE

## 2018-08-31 ENCOUNTER — Telehealth: Payer: Self-pay

## 2018-08-31 NOTE — Telephone Encounter (Signed)
EMMI Follow-up: Noted on the report that the patient hadn't read her discharge paperwork and this was the response from her second automated call.  I called [name] and left a voice message for her to call me at her convenience.

## 2018-09-03 NOTE — ED Provider Notes (Signed)
[facility] Emergency Department Provider Note   First MD Initiated Contact with Patient 08/23/18 [phone]     (approximate)  I have reviewed the triage vital signs and the nursing notes.   HISTORY  Chief Complaint Shortness of Breath    HPI Brittney [name] is a 77 y.o. female with below list of chronic medical conditions including hypertension, diabetes COPD with recent hospitalization secondary to COPD exacerbation with bilateral lower lobe pneumonia presents to the emergency department with respiratory distress.  Patient states that she was progressing well at home until today when she to experience progressive dyspnea that was unrelieved with inhalers at home on arrival to the emergency department patient in apparent respiratory distress.  She denies any known fever   Past Medical History:  Diagnosis Date  . Diabetes mellitus without complication ([place])   . GERD (gastroesophageal reflux disease)   . Hyperlipidemia   . Hypertension   . Vitamin D deficiency     Patient Active Problem List   Diagnosis Date Noted  . HCAP (healthcare-associated pneumonia) 08/23/2018  . Acute on chronic respiratory failure with hypoxia and hypercapnia ([place]) 08/05/2018    Past Surgical History:  Procedure Laterality Date  . COLONOSCOPY      Prior to Admission medications   Medication Sig Start Date End Date Taking? Authorizing Provider  albuterol (PROVENTIL HFA;VENTOLIN HFA) 108 (90 BASE) MCG/ACT inhaler Inhale 2 puffs into the lungs every 6 (six) hours as needed for wheezing or shortness of breath.    Yes [provider]  amLODipine (NORVASC) 10 MG tablet Take 10 mg by mouth daily.   Yes [provider]  aspirin 81 MG tablet Take 81 mg by mouth daily.   Yes [provider]  atorvastatin (LIPITOR) 40 MG tablet Take 1 tablet by mouth daily. 07/04/18  Yes [provider]  cetirizine (ZYRTEC) 10 MG tablet Take 10 mg by mouth daily.    Yes  [provider]  glipiZIDE (GLUCOTROL XL) 10 MG 24 hr tablet Take 10 mg by mouth 2 (two) times daily.   Yes [provider]  Liraglutide (VICTOZA) 18 MG/3ML SOPN Inject 1.8 mg into the skin every morning.   Yes [provider]  losartan (COZAAR) 100 MG tablet Take 100 mg by mouth daily.   Yes [provider]  metFORMIN (GLUCOPHAGE) 1000 MG tablet Take 1,000 mg by mouth 2 (two) times daily with a meal.   Yes [provider]  metoprolol tartrate (LOPRESSOR) 50 MG tablet Take 1 tablet (50 mg total) by mouth 2 (two) times daily. 08/11/18 10/10/18 Yes Sainani, Belia Heman, MD  omeprazole (PRILOSEC) 20 MG capsule Take 20 mg by mouth daily.   Yes [provider]  ranitidine (ZANTAC) 150 MG tablet Take 1 tablet by mouth 2 (two) times daily. 05/06/18  Yes [provider]  sitaGLIPtin (JANUVIA) 100 MG tablet Take 100 mg by mouth daily.   Yes [provider]  guaiFENesin-dextromethorphan (ROBITUSSIN DM) 100-10 MG/5ML syrup Take 5 mLs by mouth every 4 (four) hours as needed for cough. 08/25/18   [name], MD    Allergies Accupril [place] hcl]  Family History  Problem Relation Age of Onset  . Diabetes Neg Hx   . Hypertension Neg Hx     Social History Social History   Tobacco Use  . Smoking status: Current Every Day Smoker    Packs/day: 1.00    Years: 40.00    Pack years: 40.00  Types: Cigarettes  . Smokeless tobacco: Never Used  Substance Use Topics  . Alcohol use: No  . Drug use: No    Review of Systems Constitutional: No fever/chills Eyes: No visual changes. ENT: No sore throat. Cardiovascular: Denies chest pain. Respiratory: Positive for dyspnea, cough Gastrointestinal: No abdominal pain.  No nausea, no vomiting.  No diarrhea.  No constipation. Genitourinary: Negative for dysuria. Musculoskeletal: Negative for neck pain.  Negative for back pain. Integumentary: Negative for rash. Neurological: Negative for  headaches, focal weakness or numbness.   ____________________________________________   PHYSICAL EXAM:  VITAL SIGNS: ED Triage Vitals  Enc Vitals Group     BP 08/23/18 0118 (!) 213/112     Pulse Rate 08/23/18 0118 (!) 112     Resp 08/23/18 0118 (!) 24     Temp 08/23/18 0404 (!) 96.3 F (35.7 C)     Temp Source 08/23/18 0404 Axillary     SpO2 08/23/18 0113 (!) 86 %     Weight 08/23/18 0118 72.6 kg (160 lb)     Height 08/23/18 0118 1.651 m (5\' 5" )     Head Circumference --      Peak Flow --      Pain Score 08/23/18 0118 0     Pain Loc --      Pain Edu? --      Excl. in [place]? --     Constitutional: Alert and oriented.  Apparent respiratory distress Eyes: Conjunctivae are normal.  Head: Atraumatic. Mouth/Throat: Mucous membranes are moist.  Oropharynx non-erythematous. Neck: No stridor.  Cardiovascular: Tachycardia, regular rhythm. Good peripheral circulation. Grossly normal heart sounds. Respiratory: Tachypnea, positive accessory respiratory muscle use, diffuse rhonchi with crackles bibasilar Gastrointestinal: Soft and nontender. No distention.  Musculoskeletal: No lower extremity tenderness nor edema. No gross deformities of extremities. Neurologic:  Normal speech and language. No gross focal neurologic deficits are appreciated.  Skin:  Skin is warm, dry and intact. No rash noted.   ____________________________________________   LABS (all labs ordered are listed, but only abnormal results are displayed)  Labs Reviewed  BRAIN NATRIURETIC PEPTIDE - Abnormal; Notable for the following components:      Result Value   B Natriuretic Peptide 507.0 (*)    All other components within normal limits  COMPREHENSIVE METABOLIC PANEL - Abnormal; Notable for the following components:   CO2 19 (*)    Glucose, Bld 350 (*)    Calcium 8.6 (*)    ALT 105 (*)    GFR calc non Af Amer 53 (*)    All other components within normal limits  BLOOD GAS, VENOUS - Abnormal; Notable for the  following components:   pH, Ven 7.21 (*)    pO2, Ven 50.0 (*)    Bicarbonate 19.2 (*)    Acid-base deficit 8.8 (*)    All other components within normal limits  LACTIC ACID, PLASMA - Abnormal; Notable for the following components:   Lactic Acid, Venous 4.3 (*)    All other components within normal limits  LACTIC ACID, PLASMA - Abnormal; Notable for the following components:   Lactic Acid, Venous 3.5 (*)    All other components within normal limits  TSH - Abnormal; Notable for the following components:   TSH 6.465 (*)    All other components within normal limits  GLUCOSE, CAPILLARY - Abnormal; Notable for the following components:   Glucose-Capillary 332 (*)    All other components within normal limits  TROPONIN I - Abnormal; Notable for the following  components:   Troponin I 0.09 (*)    All other components within normal limits  GLUCOSE, CAPILLARY - Abnormal; Notable for the following components:   Glucose-Capillary 368 (*)    All other components within normal limits  GLUCOSE, CAPILLARY - Abnormal; Notable for the following components:   Glucose-Capillary 307 (*)    All other components within normal limits  LACTIC ACID, PLASMA - Abnormal; Notable for the following components:   Lactic Acid, Venous 5.4 (*)    All other components within normal limits  LACTIC ACID, PLASMA - Abnormal; Notable for the following components:   Lactic Acid, Venous 3.5 (*)    All other components within normal limits  GLUCOSE, CAPILLARY - Abnormal; Notable for the following components:   Glucose-Capillary 299 (*)    All other components within normal limits  BASIC METABOLIC PANEL - Abnormal; Notable for the following components:   Sodium 132 (*)    CO2 21 (*)    Glucose, Bld 357 (*)    BUN 31 (*)    Creatinine, Ser 1.22 (*)    Calcium 8.5 (*)    GFR calc non Af Amer 42 (*)    GFR calc Af Amer 48 (*)    All other components within normal limits  CBC WITH DIFFERENTIAL/PLATELET - Abnormal;  Notable for the following components:   WBC 15.4 (*)    RBC 3.68 (*)    Hemoglobin 10.2 (*)    HCT 30.3 (*)    RDW 15.8 (*)    Neutro Abs 11.9 (*)    Monocytes Absolute 1.4 (*)    All other components within normal limits  BLOOD GAS, ARTERIAL - Abnormal; Notable for the following components:   pCO2 arterial 31 (*)    pO2, Arterial 59 (*)    Acid-base deficit 3.6 (*)    All other components within normal limits  GLUCOSE, CAPILLARY - Abnormal; Notable for the following components:   Glucose-Capillary 326 (*)    All other components within normal limits  GLUCOSE, CAPILLARY - Abnormal; Notable for the following components:   Glucose-Capillary 271 (*)    All other components within normal limits  BASIC METABOLIC PANEL - Abnormal; Notable for the following components:   Glucose, Bld 277 (*)    BUN 29 (*)    Creatinine, Ser 1.11 (*)    Calcium 8.8 (*)    GFR calc non Af Amer 47 (*)    GFR calc Af Amer 54 (*)    All other components within normal limits  GLUCOSE, CAPILLARY - Abnormal; Notable for the following components:   Glucose-Capillary 254 (*)    All other components within normal limits  GLUCOSE, CAPILLARY - Abnormal; Notable for the following components:   Glucose-Capillary 246 (*)    All other components within normal limits  GLUCOSE, CAPILLARY - Abnormal; Notable for the following components:   Glucose-Capillary 288 (*)    All other components within normal limits  BASIC METABOLIC PANEL - Abnormal; Notable for the following components:   Glucose, Bld 212 (*)    All other components within normal limits  CBC - Abnormal; Notable for the following components:   WBC 11.1 (*)    Hemoglobin 11.2 (*)    HCT 33.5 (*)    RDW 15.6 (*)    All other components within normal limits  GLUCOSE, CAPILLARY - Abnormal; Notable for the following components:   Glucose-Capillary 254 (*)    All other components within normal limits  GLUCOSE, CAPILLARY -  Abnormal; Notable for the following  components:   Glucose-Capillary 193 (*)    All other components within normal limits  CULTURE, BLOOD (ROUTINE X 2)  CULTURE, BLOOD (ROUTINE X 2)  MRSA PCR SCREENING  PROCALCITONIN  MAGNESIUM  MAGNESIUM  PHOSPHORUS  PHOSPHORUS  CALCIUM, IONIZED  CALCIUM, IONIZED  MAGNESIUM  PHOSPHORUS  CALCIUM, IONIZED  LACTIC ACID, PLASMA   ____________________________________________  EKG  ED ECG REPORT I, [place] N BROWN, the attending physician, personally viewed and interpreted this ECG.    Date: 08/23/2018  EKG Time: 1:15 AM  Rate: 108  Rhythm: Sinus tachycardia with right atrial enlargement  Axis: Normal  Intervals: Normal  ST&T Change: None  ____________________________________________  RADIOLOGY I, [place] N BROWN, personally viewed and evaluated these images (plain radiographs) as part of my medical decision making, as well as reviewing the written report by the radiologist.  ED MD interpretation: Increased diffuse reticular opacities bilateral lung fields concerning for edema.  New patchy right lung opacity concerning for edema versus pneumonia.  Official radiology report(s): No results found.  ____________________________________________    Critical Care performed:   .Critical Care Performed by: Gregor Hams, MD Authorized by: Gregor Hams, MD   Critical care provider statement:    Critical care time (minutes):  45   Critical care time was exclusive of:  Separately billable procedures and treating other patients   Critical care was time spent personally by me on the following activities:  Development of treatment plan with patient or surrogate, discussions with consultants, evaluation of patient's response to treatment, examination of patient, obtaining history from patient or surrogate, ordering and performing treatments and interventions, ordering and review of laboratory studies, ordering and review of radiographic studies, pulse oximetry, re-evaluation  of patient's condition and review of old charts     ____________________________________________   INITIAL IMPRESSION / ASSESSMENT AND PLAN / ED COURSE  As part of my medical decision making, I reviewed the following data within the electronic MEDICAL RECORD NUMBER   77 year old female presenting with above-stated history and physical exam consistent with acute respiratory distress with hypoxia most likely secondary to COPD exacerbation versus pneumonia.  Chest x-ray consistent with possible pneumonia.  Given patient's presentation BiPAP applied multiple DuoNeb's and Solu-Medrol administered.  Patient given vancomycin and cefepime given recent hospitalization secondary to pneumonia.  Following these interventions patient's respiratory status markedly improved at this time.  Patient discussed with Dr. Marcille Blanco for hospital admission further evaluation and management. ____________________________________________  FINAL CLINICAL IMPRESSION(S) / ED DIAGNOSES  Final diagnoses:  Pneumonia  Acute hypoxic respiratory failure   MEDICATIONS GIVEN DURING THIS VISIT:  Medications  methylPREDNISolone sodium succinate (SOLU-MEDROL) 125 mg/2 mL injection (125 mg  Given 08/23/18 0121)  ipratropium-albuterol (DUONEB) 0.5-2.5 (3) MG/3ML nebulizer solution (3 mLs  Given 08/23/18 0121)  magnesium sulfate 2 GM/50ML IVPB (  Stopped 08/23/18 0158)  nitroGLYCERIN (NITROGLYN) 2 % ointment (1 inch  Given 08/23/18 0120)  ipratropium-albuterol (DUONEB) 0.5-2.5 (3) MG/3ML nebulizer solution (3 mLs  Given 08/23/18 0121)  ipratropium-albuterol (DUONEB) 0.5-2.5 (3) MG/3ML nebulizer solution 3 mL (3 mLs Nebulization Given 08/23/18 0125)  vancomycin (VANCOCIN) IVPB 1000 mg/200 mL premix ( Intravenous Stopped 08/23/18 0316)  ceFEPIme (MAXIPIME) 2 g in sodium chloride 0.9 % 100 mL IVPB ( Intravenous Stopped 08/23/18 0254)  albuterol (PROVENTIL) (2.5 MG/3ML) 0.083% nebulizer solution 2.5 mg (2.5 mg Nebulization Given 08/23/18 0222)    furosemide (LASIX) injection 40 mg (40 mg Intravenous Given 08/23/18 0423)     ED Discharge  Orders         Ordered    Increase activity slowly     08/25/18 0811    Diet - low sodium heart healthy     08/25/18 0811    guaiFENesin-dextromethorphan (ROBITUSSIN DM) 100-10 MG/5ML syrup  Every 4 hours PRN     08/25/18 0816    amoxicillin-clavulanate (AUGMENTIN) 875-125 MG tablet  2 times daily     08/25/18 0816           Note:  This document was prepared using Dragon voice recognition software and may include unintentional dictation errors.    Gregor Hams, MD 09/03/18 2213

## 2018-11-10 IMAGING — US US RENAL
1 series · 14 of 25 positions shown · non-contrast
Comparison: None.

CLINICAL DATA: Acute kidney injury

EXAM:
RENAL / URINARY TRACT ULTRASOUND COMPLETE

[Series 1: us renal · 0.23mm/px · 14 of 32 slices shown]
[im 1/32]
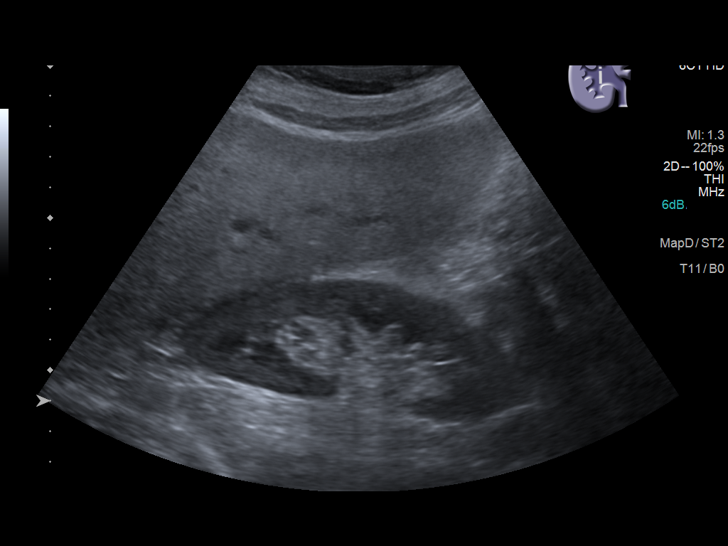
[im 3/32]
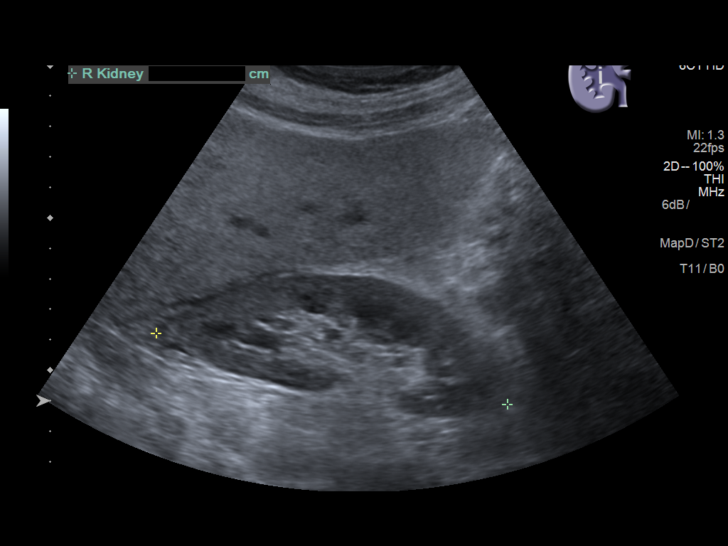
[im 6/32]
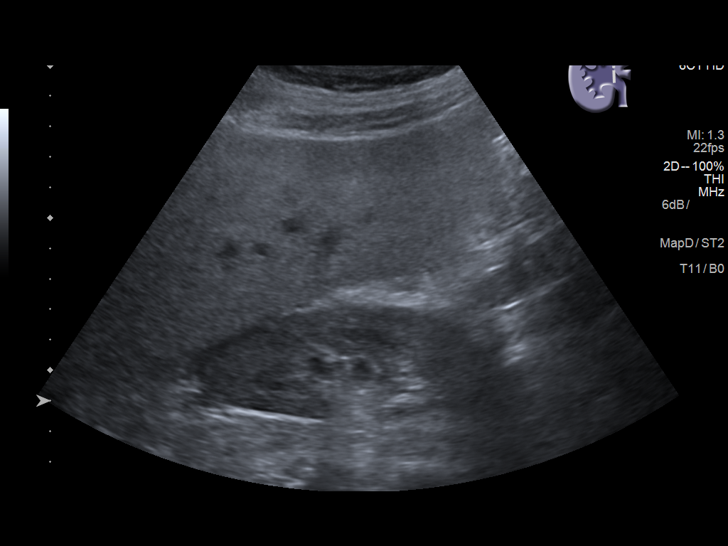
[im 8/32]
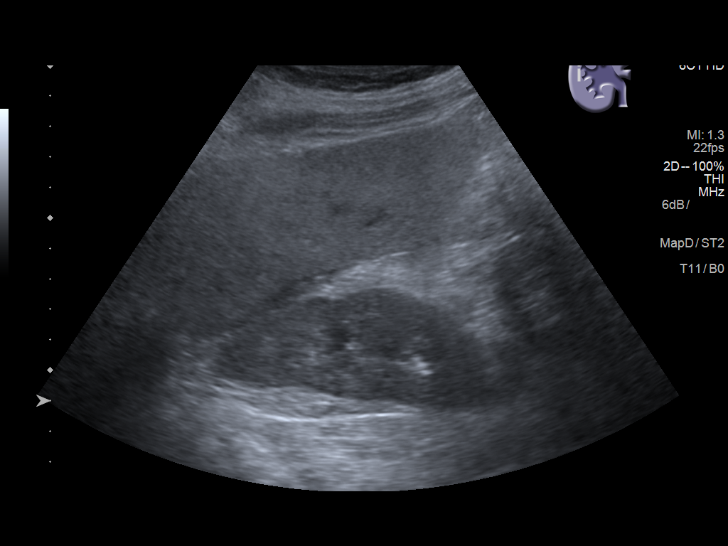
[im 11/32]
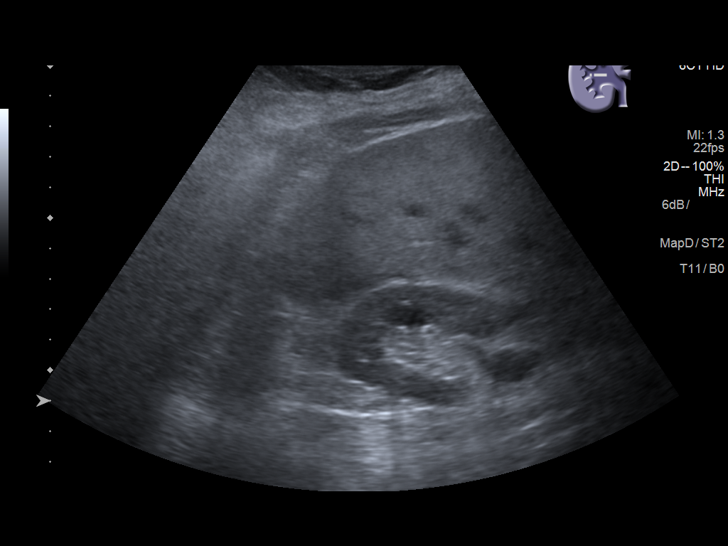
[im 12/32]
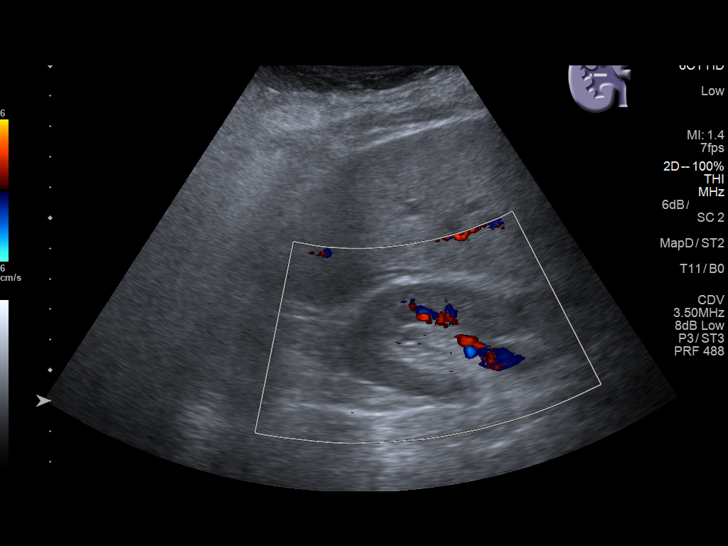
[im 15/32]
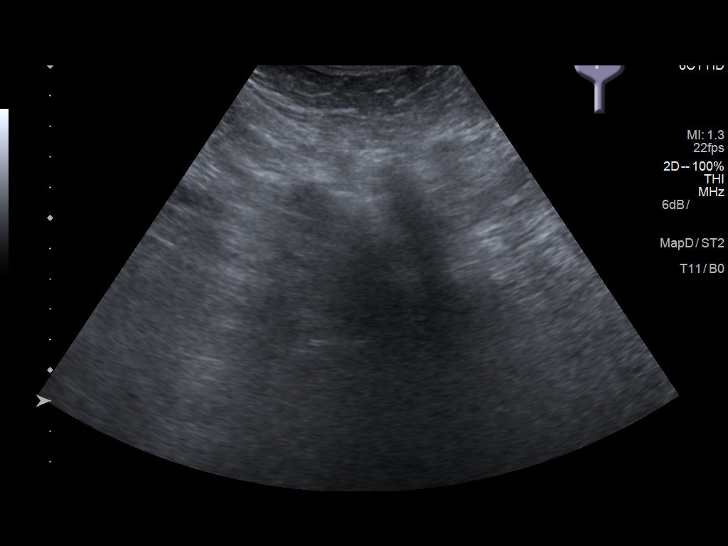
[im 17/32]
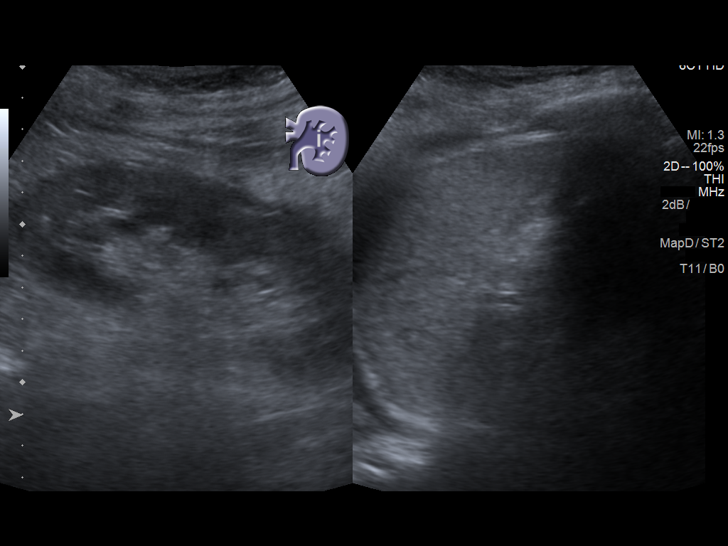
[im 20/32]
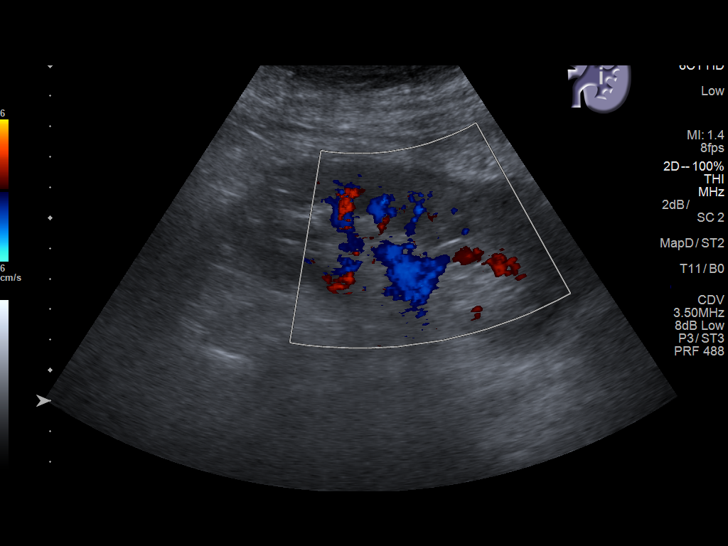
[im 21/32]
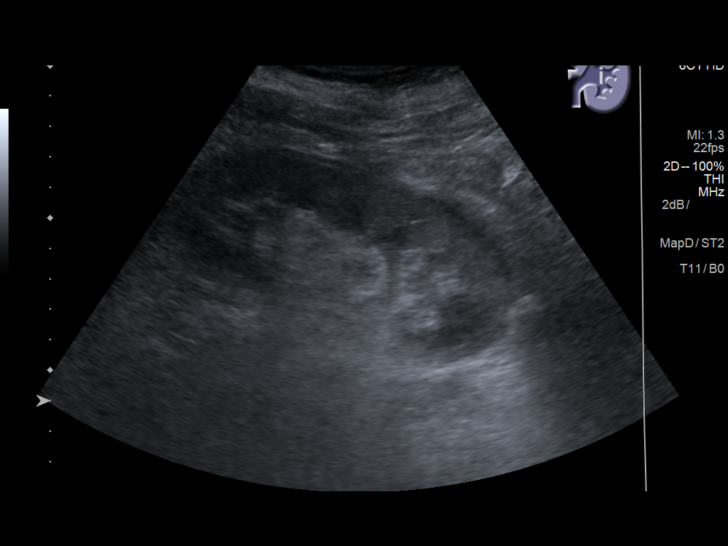
[im 24/32]
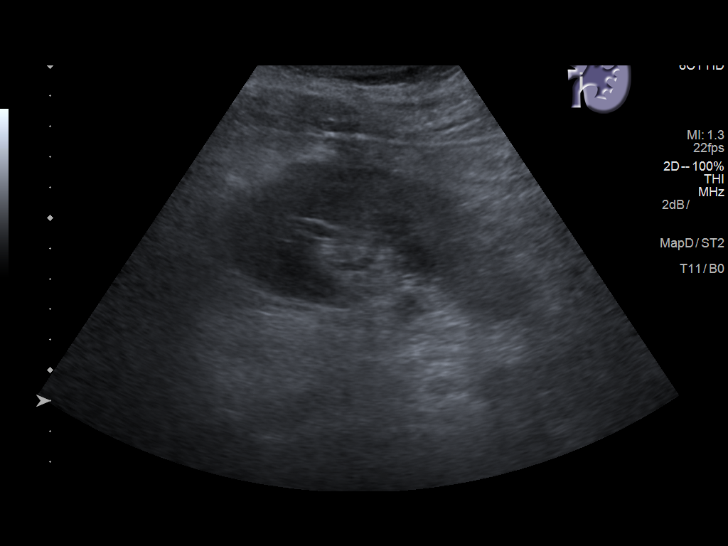
[im 26/32]
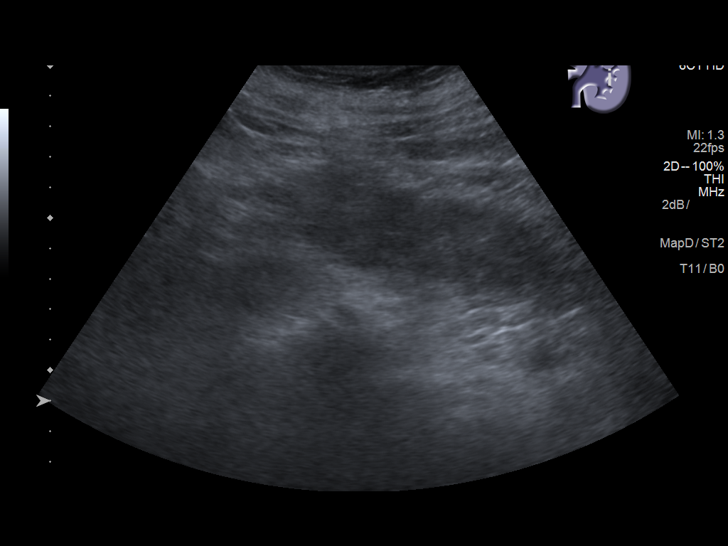
[im 29/32]
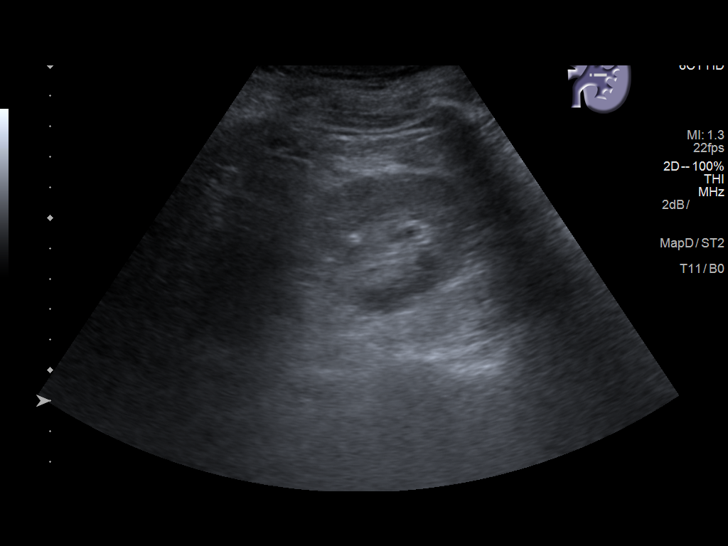
[im 32/32]
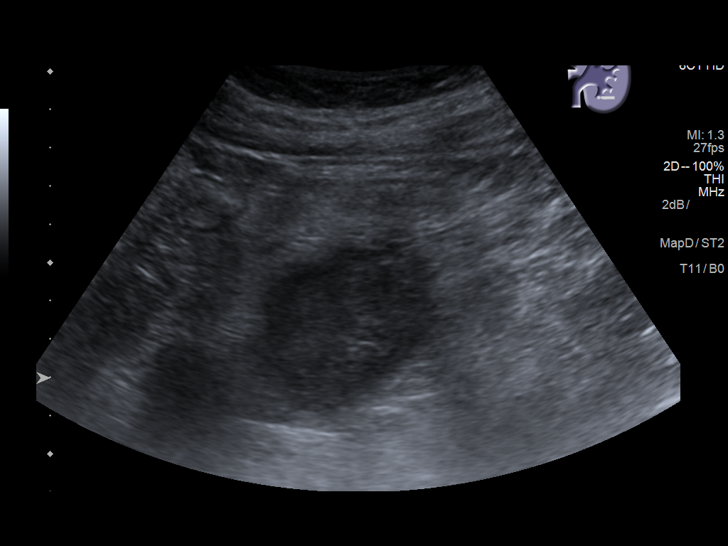

[14 of 25 positions shown; findings below may reference images not displayed]

FINDINGS: Right Kidney:

Length: 11.7 cm. Echogenicity within normal limits. No mass or
hydronephrosis visualized.

Left Kidney:

Length: 11.8 cm. Trace amount of perinephric fluid.. Echogenicity
within normal limits. No mass or hydronephrosis visualized.

Bladder:

Decompressed bladder with a Foley catheter present.

Other: No focal lesion identified. Increased hepatic parenchymal
echogenicity.
IMPRESSION: 1. No obstructive uropathy.  Trace amount of left perinephric fluid.

## 2019-01-02 ENCOUNTER — Emergency Department: Payer: Medicare HMO

## 2019-01-02 ENCOUNTER — Inpatient Hospital Stay: Payer: Medicare HMO

## 2019-01-02 ENCOUNTER — Inpatient Hospital Stay
Admit: 2019-01-02 | Discharge: 2019-01-02 | Disposition: A | Discharge status: Home / Self Care | Payer: Medicare HMO | Attending: Internal Medicine | Admitting: Internal Medicine

## 2019-01-02 ENCOUNTER — Other Ambulatory Visit: Payer: Self-pay

## 2019-01-02 ENCOUNTER — Inpatient Hospital Stay: Admit: 2019-01-02 | Payer: Medicare HMO

## 2019-01-02 ENCOUNTER — Inpatient Hospital Stay
Admission: EM | Admit: 2019-01-02 | Discharge: 2019-01-06 | DRG: 871 | Disposition: A | Discharge status: Home Health | Payer: Medicare HMO | Attending: Internal Medicine | Admitting: Internal Medicine

## 2019-01-02 DIAGNOSIS — J441 Chronic obstructive pulmonary disease with (acute) exacerbation: Secondary | ICD-10-CM | POA: Diagnosis present

## 2019-01-02 DIAGNOSIS — J9601 Acute respiratory failure with hypoxia: Secondary | ICD-10-CM | POA: Diagnosis not present

## 2019-01-02 DIAGNOSIS — F17210 Nicotine dependence, cigarettes, uncomplicated: Secondary | ICD-10-CM | POA: Diagnosis present

## 2019-01-02 DIAGNOSIS — J9622 Acute and chronic respiratory failure with hypercapnia: Secondary | ICD-10-CM | POA: Diagnosis present

## 2019-01-02 DIAGNOSIS — R0902 Hypoxemia: Secondary | ICD-10-CM

## 2019-01-02 DIAGNOSIS — E872 Acidosis: Secondary | ICD-10-CM | POA: Diagnosis present

## 2019-01-02 DIAGNOSIS — J810 Acute pulmonary edema: Secondary | ICD-10-CM

## 2019-01-02 DIAGNOSIS — J9621 Acute and chronic respiratory failure with hypoxia: Secondary | ICD-10-CM | POA: Diagnosis present

## 2019-01-02 DIAGNOSIS — R0603 Acute respiratory distress: Secondary | ICD-10-CM

## 2019-01-02 DIAGNOSIS — J189 Pneumonia, unspecified organism: Secondary | ICD-10-CM

## 2019-01-02 DIAGNOSIS — Z888 Allergy status to other drugs, medicaments and biological substances status: Secondary | ICD-10-CM | POA: Diagnosis not present

## 2019-01-02 DIAGNOSIS — I110 Hypertensive heart disease with heart failure: Secondary | ICD-10-CM | POA: Diagnosis present

## 2019-01-02 DIAGNOSIS — Z7984 Long term (current) use of oral hypoglycemic drugs: Secondary | ICD-10-CM | POA: Diagnosis not present

## 2019-01-02 DIAGNOSIS — Z978 Presence of other specified devices: Secondary | ICD-10-CM

## 2019-01-02 DIAGNOSIS — E785 Hyperlipidemia, unspecified: Secondary | ICD-10-CM | POA: Diagnosis present

## 2019-01-02 DIAGNOSIS — K219 Gastro-esophageal reflux disease without esophagitis: Secondary | ICD-10-CM | POA: Diagnosis present

## 2019-01-02 DIAGNOSIS — R7989 Other specified abnormal findings of blood chemistry: Secondary | ICD-10-CM | POA: Diagnosis present

## 2019-01-02 DIAGNOSIS — E559 Vitamin D deficiency, unspecified: Secondary | ICD-10-CM | POA: Diagnosis present

## 2019-01-02 DIAGNOSIS — Z7982 Long term (current) use of aspirin: Secondary | ICD-10-CM | POA: Diagnosis not present

## 2019-01-02 DIAGNOSIS — R570 Cardiogenic shock: Secondary | ICD-10-CM | POA: Diagnosis present

## 2019-01-02 DIAGNOSIS — J440 Chronic obstructive pulmonary disease with acute lower respiratory infection: Secondary | ICD-10-CM | POA: Diagnosis present

## 2019-01-02 DIAGNOSIS — Z8701 Personal history of pneumonia (recurrent): Secondary | ICD-10-CM | POA: Diagnosis not present

## 2019-01-02 DIAGNOSIS — J159 Unspecified bacterial pneumonia: Secondary | ICD-10-CM | POA: Diagnosis present

## 2019-01-02 DIAGNOSIS — Z79899 Other long term (current) drug therapy: Secondary | ICD-10-CM | POA: Diagnosis not present

## 2019-01-02 DIAGNOSIS — A419 Sepsis, unspecified organism: Secondary | ICD-10-CM | POA: Diagnosis present

## 2019-01-02 DIAGNOSIS — I429 Cardiomyopathy, unspecified: Secondary | ICD-10-CM | POA: Diagnosis present

## 2019-01-02 DIAGNOSIS — E1165 Type 2 diabetes mellitus with hyperglycemia: Secondary | ICD-10-CM | POA: Diagnosis present

## 2019-01-02 DIAGNOSIS — I5043 Acute on chronic combined systolic (congestive) and diastolic (congestive) heart failure: Secondary | ICD-10-CM | POA: Diagnosis present

## 2019-01-02 DIAGNOSIS — N179 Acute kidney failure, unspecified: Secondary | ICD-10-CM | POA: Diagnosis present

## 2019-01-02 DIAGNOSIS — Z4659 Encounter for fitting and adjustment of other gastrointestinal appliance and device: Secondary | ICD-10-CM

## 2019-01-02 LAB — URINALYSIS, ROUTINE W REFLEX MICROSCOPIC
Bacteria, UA: NONE SEEN
Bilirubin Urine: NEGATIVE
Glucose, UA: 50 mg/dL — AB
Hgb urine dipstick: NEGATIVE
Ketones, ur: NEGATIVE mg/dL
Leukocytes, UA: NEGATIVE
Nitrite: NEGATIVE
Protein, ur: >=300 mg/dL — AB
Specific Gravity, Urine: 1.01 (ref 1.005–1.030)
pH: 6 (ref 5.0–8.0)

## 2019-01-02 LAB — TROPONIN I
Troponin I: 0.04 ng/mL (ref <0.03)
Troponin I: 0.14 ng/mL (ref <0.03)
Troponin I: 0.28 ng/mL (ref <0.03)

## 2019-01-02 LAB — GLUCOSE, CAPILLARY
Glucose-Capillary: 389 mg/dL — ABNORMAL HIGH (ref 70–99)
Glucose-Capillary: 411 mg/dL — ABNORMAL HIGH (ref 70–99)
Glucose-Capillary: 342 mg/dL — ABNORMAL HIGH (ref 70–99)
Glucose-Capillary: 316 mg/dL — ABNORMAL HIGH (ref 70–99)
Glucose-Capillary: 309 mg/dL — ABNORMAL HIGH (ref 70–99)
Glucose-Capillary: 310 mg/dL — ABNORMAL HIGH (ref 70–99)
Glucose-Capillary: 313 mg/dL — ABNORMAL HIGH (ref 70–99)
Glucose-Capillary: 264 mg/dL — ABNORMAL HIGH (ref 70–99)

## 2019-01-02 LAB — STREP PNEUMONIAE URINARY ANTIGEN: Strep Pneumo Urinary Antigen: NEGATIVE

## 2019-01-02 LAB — BLOOD GAS, ARTERIAL
Acid-base deficit: 5.1 mmol/L — ABNORMAL HIGH (ref 0.0–2.0)
Acid-base deficit: 12.3 mmol/L — ABNORMAL HIGH (ref 0.0–2.0)
Acid-base deficit: 11.8 mmol/L — ABNORMAL HIGH (ref 0.0–2.0)
Bicarbonate: 19.9 mmol/L — ABNORMAL LOW (ref 20.0–28.0)
Bicarbonate: 16.7 mmol/L — ABNORMAL LOW (ref 20.0–28.0)
Bicarbonate: 14.6 mmol/L — ABNORMAL LOW (ref 20.0–28.0)
FIO2: 0.4
FIO2: 0.3
FIO2: 0.6
MECHVT: 400 mL
MECHVT: 480 mL
O2 Saturation: 88.2 %
O2 Saturation: 94.9 %
O2 Saturation: 97 %
PEEP: 5 cmH2O
PEEP: 8 cmH2O
Patient temperature: 37
Patient temperature: 37
Patient temperature: 37
RATE: 22 resp/min
RATE: 24 resp/min
pCO2 arterial: 36 mmHg (ref 32.0–48.0)
pCO2 arterial: 49 mmHg — ABNORMAL HIGH (ref 32.0–48.0)
pCO2 arterial: 34 mmHg (ref 32.0–48.0)
pH, Arterial: 7.35 (ref 7.350–7.450)
pH, Arterial: 7.14 — CL (ref 7.350–7.450)
pH, Arterial: 7.24 — ABNORMAL LOW (ref 7.350–7.450)
pO2, Arterial: 58 mmHg — ABNORMAL LOW (ref 83.0–108.0)
pO2, Arterial: 97 mmHg (ref 83.0–108.0)
pO2, Arterial: 106 mmHg (ref 83.0–108.0)

## 2019-01-02 LAB — CBC WITH DIFFERENTIAL/PLATELET
Abs Immature Granulocytes: 0.15 10*3/uL — ABNORMAL HIGH (ref 0.00–0.07)
Basophils Absolute: 0.1 10*3/uL (ref 0.0–0.1)
Basophils Relative: 1 %
Eosinophils Absolute: 0.3 10*3/uL (ref 0.0–0.5)
Eosinophils Relative: 2 %
HCT: 39.9 % (ref 36.0–46.0)
Hemoglobin: 12.7 g/dL (ref 12.0–15.0)
Immature Granulocytes: 1 %
Lymphocytes Relative: 27 %
Lymphs Abs: 5.3 10*3/uL — ABNORMAL HIGH (ref 0.7–4.0)
MCH: 26.5 pg (ref 26.0–34.0)
MCHC: 31.8 g/dL (ref 30.0–36.0)
MCV: 83.3 fL (ref 80.0–100.0)
Monocytes Absolute: 1.2 10*3/uL — ABNORMAL HIGH (ref 0.1–1.0)
Monocytes Relative: 6 %
Neutro Abs: 12.4 10*3/uL — ABNORMAL HIGH (ref 1.7–7.7)
Neutrophils Relative %: 63 %
Platelets: 395 10*3/uL (ref 150–400)
RBC: 4.79 MIL/uL (ref 3.87–5.11)
RDW: 14.6 % (ref 11.5–15.5)
WBC: 19.5 10*3/uL — ABNORMAL HIGH (ref 4.0–10.5)
nRBC: 0 % (ref 0.0–0.2)

## 2019-01-02 LAB — ECHOCARDIOGRAM COMPLETE
Height: 66 in
Weight: 2786.61 oz

## 2019-01-02 LAB — COMPREHENSIVE METABOLIC PANEL
ALT: 14 U/L (ref 0–44)
AST: 19 U/L (ref 15–41)
Albumin: 3.8 g/dL (ref 3.5–5.0)
Alkaline Phosphatase: 55 U/L (ref 38–126)
Anion gap: 12 (ref 5–15)
BUN: 17 mg/dL (ref 8–23)
CO2: 22 mmol/L (ref 22–32)
Calcium: 9 mg/dL (ref 8.9–10.3)
Chloride: 105 mmol/L (ref 98–111)
Creatinine, Ser: 0.97 mg/dL (ref 0.44–1.00)
GFR calc Af Amer: >60 mL/min (ref >60)
GFR calc non Af Amer: 56 mL/min — ABNORMAL LOW (ref >60)
Glucose, Bld: 271 mg/dL — ABNORMAL HIGH (ref 70–99)
Potassium: 3.9 mmol/L (ref 3.5–5.1)
Sodium: 139 mmol/L (ref 135–145)
Total Bilirubin: 0.8 mg/dL (ref 0.3–1.2)
Total Protein: 7.3 g/dL (ref 6.5–8.1)

## 2019-01-02 LAB — PROCALCITONIN: Procalcitonin: <0.1 ng/mL

## 2019-01-02 LAB — INFLUENZA PANEL BY PCR (TYPE A & B)
Influenza A By PCR: NEGATIVE
Influenza B By PCR: NEGATIVE

## 2019-01-02 LAB — BRAIN NATRIURETIC PEPTIDE: B Natriuretic Peptide: 258 pg/mL — ABNORMAL HIGH (ref 0.0–100.0)

## 2019-01-02 LAB — MRSA PCR SCREENING: MRSA by PCR: NEGATIVE

## 2019-01-02 LAB — CG4 I-STAT (LACTIC ACID)
Lactic Acid, Venous: 4.57 mmol/L (ref 0.5–1.9)
Lactic Acid, Venous: 5.61 mmol/L (ref 0.5–1.9)

## 2019-01-02 MED ORDER — ENOXAPARIN SODIUM 80 MG/0.8ML ~~LOC~~ SOLN
1.0000 mg/kg | Freq: Once | SUBCUTANEOUS | Status: AC
  Administered 2019-01-02: 75 mg via SUBCUTANEOUS
  Filled 2019-01-02 (×2): qty 0.8

## 2019-01-02 MED ORDER — INSULIN ASPART 100 UNIT/ML ~~LOC~~ SOLN
0.0000 [IU] | Freq: Three times a day (TID) | SUBCUTANEOUS | Status: DC
  Administered 2019-01-02: 15 [IU] via SUBCUTANEOUS
  Administered 2019-01-02: 11 [IU] via SUBCUTANEOUS
  Filled 2019-01-02 (×2): qty 1

## 2019-01-02 MED ORDER — VANCOMYCIN HCL IN DEXTROSE 1-5 GM/200ML-% IV SOLN
1000.0000 mg | Freq: Once | INTRAVENOUS | Status: DC
  Filled 2019-01-02: qty 200

## 2019-01-02 MED ORDER — IPRATROPIUM-ALBUTEROL 0.5-2.5 (3) MG/3ML IN SOLN
3.0000 mL | RESPIRATORY_TRACT | Status: DC
  Administered 2019-01-02 – 2019-01-03 (×8): 3 mL via RESPIRATORY_TRACT
  Filled 2019-01-02 (×9): qty 3

## 2019-01-02 MED ORDER — IOHEXOL 350 MG/ML SOLN
75.0000 mL | Freq: Once | INTRAVENOUS | Status: AC | PRN
  Administered 2019-01-02: 75 mL via INTRAVENOUS

## 2019-01-02 MED ORDER — SUCCINYLCHOLINE CHLORIDE 20 MG/ML IJ SOLN
200.0000 mg | Freq: Once | INTRAMUSCULAR | Status: AC
  Administered 2019-01-02: 200 mg via INTRAVENOUS

## 2019-01-02 MED ORDER — MORPHINE SULFATE (PF) 2 MG/ML IV SOLN
2.0000 mg | Freq: Once | INTRAVENOUS | Status: DC
  Filled 2019-01-02: qty 1

## 2019-01-02 MED ORDER — SODIUM CHLORIDE 0.9 % IV SOLN
0.0000 ug/min | INTRAVENOUS | Status: DC
  Administered 2019-01-02: 50 ug/min via INTRAVENOUS
  Administered 2019-01-02: 100 ug/min via INTRAVENOUS
  Administered 2019-01-02: 65 ug/min via INTRAVENOUS
  Administered 2019-01-02: 50 ug/min via INTRAVENOUS
  Administered 2019-01-03: 35 ug/min via INTRAVENOUS
  Filled 2019-01-02: qty 1
  Filled 2019-01-02 (×4): qty 10
  Filled 2019-01-02: qty 1

## 2019-01-02 MED ORDER — IPRATROPIUM-ALBUTEROL 0.5-2.5 (3) MG/3ML IN SOLN
3.0000 mL | Freq: Once | RESPIRATORY_TRACT | Status: AC
  Administered 2019-01-02: 3 mL via RESPIRATORY_TRACT
  Filled 2019-01-02: qty 3

## 2019-01-02 MED ORDER — BISACODYL 5 MG PO TBEC
5.0000 mg | DELAYED_RELEASE_TABLET | Freq: Every day | ORAL | Status: DC | PRN
  Filled 2019-01-02: qty 1

## 2019-01-02 MED ORDER — PROPOFOL 1000 MG/100ML IV EMUL
5.0000 ug/kg/min | INTRAVENOUS | Status: DC
  Administered 2019-01-02: 40 ug/kg/min via INTRAVENOUS
  Administered 2019-01-02: 50 ug/kg/min via INTRAVENOUS
  Administered 2019-01-02 – 2019-01-03 (×4): 60 ug/kg/min via INTRAVENOUS
  Administered 2019-01-03: 45 ug/kg/min via INTRAVENOUS
  Filled 2019-01-02 (×6): qty 100

## 2019-01-02 MED ORDER — FUROSEMIDE 10 MG/ML IJ SOLN
40.0000 mg | Freq: Two times a day (BID) | INTRAMUSCULAR | Status: DC
  Administered 2019-01-02 – 2019-01-03 (×3): 40 mg via INTRAVENOUS
  Filled 2019-01-02 (×3): qty 4

## 2019-01-02 MED ORDER — PIPERACILLIN-TAZOBACTAM 3.375 G IVPB
3.3750 g | Freq: Three times a day (TID) | INTRAVENOUS | Status: DC
  Administered 2019-01-02: 3.375 g via INTRAVENOUS

## 2019-01-02 MED ORDER — ASPIRIN 81 MG PO CHEW
81.0000 mg | CHEWABLE_TABLET | Freq: Every day | ORAL | Status: DC
  Administered 2019-01-02 – 2019-01-06 (×5): 81 mg via ORAL
  Filled 2019-01-02 (×5): qty 1

## 2019-01-02 MED ORDER — PANTOPRAZOLE SODIUM 40 MG PO TBEC: 40.0000 mg | DELAYED_RELEASE_TABLET | Freq: Every day | ORAL | Status: DC

## 2019-01-02 MED ORDER — PHENYLEPHRINE HCL-NACL 10-0.9 MG/250ML-% IV SOLN
0.0000 ug/min | INTRAVENOUS | Status: DC
  Filled 2019-01-02: qty 250

## 2019-01-02 MED ORDER — ACETAMINOPHEN 325 MG PO TABS: 650.0000 mg | ORAL_TABLET | ORAL | Status: DC | PRN

## 2019-01-02 MED ORDER — ETOMIDATE 2 MG/ML IV SOLN
20.0000 mg | Freq: Once | INTRAVENOUS | Status: AC
  Administered 2019-01-02: 20 mg via INTRAVENOUS

## 2019-01-02 MED ORDER — METHYLPREDNISOLONE SODIUM SUCC 125 MG IJ SOLR
125.0000 mg | Freq: Once | INTRAMUSCULAR | Status: AC
  Administered 2019-01-02: 125 mg via INTRAVENOUS
  Filled 2019-01-02: qty 2

## 2019-01-02 MED ORDER — ONDANSETRON HCL 4 MG/2ML IJ SOLN
4.0000 mg | Freq: Once | INTRAMUSCULAR | Status: DC
  Filled 2019-01-02: qty 2

## 2019-01-02 MED ORDER — SODIUM CHLORIDE 0.9 % IV BOLUS (SEPSIS)
1000.0000 mL | Freq: Once | INTRAVENOUS | Status: AC
  Administered 2019-01-02: 1000 mL via INTRAVENOUS

## 2019-01-02 MED ORDER — SODIUM CHLORIDE 0.9 % IV SOLN: 1.0000 g | INTRAVENOUS | Status: DC

## 2019-01-02 MED ORDER — SODIUM CHLORIDE 0.9 % IV SOLN
2.0000 g | INTRAVENOUS | Status: DC
  Administered 2019-01-02: 2 g via INTRAVENOUS
  Filled 2019-01-02: qty 20

## 2019-01-02 MED ORDER — SODIUM CHLORIDE 0.9 % IV SOLN
500.0000 mg | INTRAVENOUS | Status: DC
  Administered 2019-01-02: 500 mg via INTRAVENOUS
  Filled 2019-01-02: qty 500

## 2019-01-02 MED ORDER — SENNOSIDES-DOCUSATE SODIUM 8.6-50 MG PO TABS: 1.0000 | ORAL_TABLET | Freq: Every evening | ORAL | Status: DC | PRN

## 2019-01-02 MED ORDER — ENOXAPARIN SODIUM 40 MG/0.4ML ~~LOC~~ SOLN: 40.0000 mg | SUBCUTANEOUS | Status: DC

## 2019-01-02 MED ORDER — SODIUM CHLORIDE 0.9 % IV BOLUS (SEPSIS): 250.0000 mL | Freq: Once | INTRAVENOUS | Status: DC

## 2019-01-02 MED ORDER — ENOXAPARIN SODIUM 40 MG/0.4ML ~~LOC~~ SOLN
40.0000 mg | SUBCUTANEOUS | Status: DC
  Administered 2019-01-03: 40 mg via SUBCUTANEOUS
  Filled 2019-01-02: qty 0.4

## 2019-01-02 MED ORDER — FUROSEMIDE 10 MG/ML IJ SOLN
40.0000 mg | Freq: Once | INTRAMUSCULAR | Status: AC
  Administered 2019-01-02: 40 mg via INTRAVENOUS
  Filled 2019-01-02: qty 4

## 2019-01-02 MED ORDER — SODIUM CHLORIDE 0.9 % IV SOLN: 500.0000 mg | INTRAVENOUS | Status: DC

## 2019-01-02 MED ORDER — METHYLPREDNISOLONE SODIUM SUCC 125 MG IJ SOLR
60.0000 mg | Freq: Two times a day (BID) | INTRAMUSCULAR | Status: DC
  Administered 2019-01-02 – 2019-01-03 (×2): 60 mg via INTRAVENOUS
  Filled 2019-01-02 (×2): qty 2

## 2019-01-02 MED ORDER — ATORVASTATIN CALCIUM 20 MG PO TABS
40.0000 mg | ORAL_TABLET | Freq: Every day | ORAL | Status: DC
  Administered 2019-01-02 – 2019-01-06 (×5): 40 mg via ORAL
  Filled 2019-01-02 (×5): qty 2

## 2019-01-02 MED ORDER — ORAL CARE MOUTH RINSE
15.0000 mL | OROMUCOSAL | Status: DC
  Administered 2019-01-02 – 2019-01-03 (×9): 15 mL via OROMUCOSAL

## 2019-01-02 MED ORDER — CHLORHEXIDINE GLUCONATE 0.12% ORAL RINSE (MEDLINE KIT)
15.0000 mL | Freq: Two times a day (BID) | OROMUCOSAL | Status: DC
  Administered 2019-01-02 – 2019-01-05 (×3): 15 mL via OROMUCOSAL

## 2019-01-02 MED ORDER — INSULIN REGULAR(HUMAN) IN NACL 100-0.9 UT/100ML-% IV SOLN
INTRAVENOUS | Status: DC
  Administered 2019-01-02: 2.5 [IU]/h via INTRAVENOUS
  Administered 2019-01-03: 6.9 [IU]/h via INTRAVENOUS
  Filled 2019-01-02 (×2): qty 100

## 2019-01-02 MED ORDER — ONDANSETRON HCL 4 MG/2ML IJ SOLN
4.0000 mg | Freq: Four times a day (QID) | INTRAMUSCULAR | Status: DC | PRN
  Administered 2019-01-03: 4 mg via INTRAVENOUS
  Filled 2019-01-02: qty 2

## 2019-01-02 MED ORDER — PREDNISONE 10 MG PO TABS: 40.0000 mg | ORAL_TABLET | Freq: Every day | ORAL | Status: DC

## 2019-01-02 MED ORDER — LOSARTAN POTASSIUM 50 MG PO TABS
100.0000 mg | ORAL_TABLET | Freq: Every day | ORAL | Status: DC
  Administered 2019-01-02: 100 mg via ORAL
  Filled 2019-01-02: qty 2

## 2019-01-02 MED ORDER — AMLODIPINE BESYLATE 10 MG PO TABS: 10.0000 mg | ORAL_TABLET | Freq: Every day | ORAL | Status: DC

## 2019-01-02 MED ORDER — NITROGLYCERIN 2 % TD OINT
1.0000 [in_us] | TOPICAL_OINTMENT | Freq: Once | TRANSDERMAL | Status: AC
  Administered 2019-01-02: 1 [in_us] via TOPICAL
  Filled 2019-01-02: qty 1

## 2019-01-02 MED ORDER — INSULIN ASPART 100 UNIT/ML ~~LOC~~ SOLN: 0.0000 [IU] | Freq: Every day | SUBCUTANEOUS | Status: DC

## 2019-01-02 MED ORDER — PROPOFOL 1000 MG/100ML IV EMUL
INTRAVENOUS | Status: AC
  Administered 2019-01-02: 50 ug/kg/min via INTRAVENOUS
  Filled 2019-01-02: qty 100

## 2019-01-02 NOTE — Progress Notes (Signed)
Inpatient Diabetes Program Recommendations  AACE/ADA: New Consensus Statement on Inpatient Glycemic Control (2015)  Target Ranges:  Prepandial:   less than 140 mg/dL      Peak postprandial:   less than 180 mg/dL (1-2 hours)      Critically ill patients:  140 - 180 mg/dL   Lab Results  Component Value Date   GLUCAP 411 (H) 01/02/2019   HGBA1C 7.3 (H) 08/06/2018    Review of Glycemic Control Results for Brittney [name], Brittney [name] (MRN [id number]) as of 01/02/2019 12:37  Ref. Range 01/02/2019 07:38 01/02/2019 09:19  Glucose-Capillary Latest Ref Range: 70 - 99 mg/dL 389 (H) 411 (H)   Diabetes history: DM2 Outpatient Diabetes medications: Glucotrol XL 10 mg bid + Victoza 1.8 mg qd + Metformin 1 gm bid + Ozempic 0.25 mg q weeks + Januvia 100 mg qd Current orders for Inpatient glycemic control: Novolog moderate correction tid + hs 0-5 units  Inpatient Diabetes Program Recommendations:   Since patient is on vent and NPO: -ICU Diabetes Adult patient order set -Lantus 8 units daily (0.1 unit/kg x 79 kg) -Change Novolog correction to q 4 hrs. Will follow.  Thank you, Nani Gasser. Abeera Flannery, RN, MSN, CDE  Diabetes Coordinator Inpatient Glycemic Control Team Team Pager [phone] (8am-5pm) 01/02/2019 12:41 PM

## 2019-01-02 NOTE — Consult Note (Signed)
Name: Brittney [name] MRN: [id number] DOB: [birth year]     CONSULTATION DATE: 01/02/2019  REFERRING MD :  Pyreddy  CHIEF COMPLAINT:  Acute resp failure    HISTORY OF PRESENT ILLNESS:   78 yo with multiple medical issues   Presented with increased WOB/increased SOB  +wheezing in ER, severe hypoxia  Given nebs, steroids  Intubated for severe resp failure  Now intubated,sedated Full vent support  ABG severe Metabolic acidosis ECHO EF 45%   PAST MEDICAL HISTORY :   has a past medical history of Diabetes mellitus without complication ([place]), GERD (gastroesophageal reflux disease), Hyperlipidemia, Hypertension, and Vitamin D deficiency.  has a past surgical history that includes Colonoscopy. Prior to Admission medications   Medication Sig Start Date End Date Taking? Authorizing Provider  amLODipine (NORVASC) 10 MG tablet Take 10 mg by mouth daily.   Yes [provider]  aspirin 81 MG tablet Take 81 mg by mouth daily.   Yes [provider]  atorvastatin (LIPITOR) 40 MG tablet Take 1 tablet by mouth daily. 07/04/18  Yes [provider]  cetirizine (ZYRTEC) 10 MG tablet Take 10 mg by mouth daily.    Yes [provider]  Choline Fenofibrate 45 MG capsule Take 90 mg by mouth daily.   Yes [provider]  Liraglutide (VICTOZA) 18 MG/3ML SOPN Inject 1.8 mg into the skin every morning.   Yes [provider]  losartan (COZAAR) 100 MG tablet Take 100 mg by mouth daily.   Yes [provider]  metFORMIN (GLUCOPHAGE) 1000 MG tablet Take 1,000 mg by mouth 2 (two) times daily with a meal.   Yes [provider]  omeprazole (PRILOSEC) 20 MG capsule Take 20 mg by mouth daily.   Yes [provider]  ranitidine (ZANTAC) 150 MG tablet Take 1 tablet by mouth 2 (two) times daily. 05/06/18  Yes [provider]  Semaglutide (OZEMPIC, 0.25 OR 0.5 MG/DOSE, [place]) Inject 0.25 mg into the skin once a week. For 4 weeks    Yes [provider]  sitaGLIPtin (JANUVIA) 100 MG tablet Take 100 mg by mouth daily.   Yes [provider]  glipiZIDE (GLUCOTROL XL) 10 MG 24 hr tablet Take 10 mg by mouth 2 (two) times daily.    [provider]  guaiFENesin-dextromethorphan (ROBITUSSIN DM) 100-10 MG/5ML syrup Take 5 mLs by mouth every 4 (four) hours as needed for cough. Patient not taking: Reported on 01/02/2019 08/25/18   [name], MD  metoprolol tartrate (LOPRESSOR) 50 MG tablet Take 1 tablet (50 mg total) by mouth 2 (two) times daily. 08/11/18 10/10/18  [name], MD   Allergies  Allergen Reactions  . Accupril [Quinapril Hcl]     FAMILY HISTORY:  +HTN  SOCIAL HISTORY:  reports that she has been smoking cigarettes. She has a 40.00 pack-year smoking history. She has never used smokeless tobacco. She reports that she does not drink alcohol or use drugs.  REVIEW OF SYSTEMS:   Unable to obtain due to critical illness   VITAL SIGNS: Temp:  [97.3 F (36.3 C)-97.8 F (36.6 C)] 97.3 F (36.3 C) (01/07 1200) Pulse Rate:  [107-128] 114 (01/07 1300) Resp:  [18-32] 27 (01/07 1300) BP: (70-190)/(53-158) 108/55 (01/07 1300) SpO2:  [79 %-100 %] 97 % (01/07 1300) FiO2 (%):  [70 %-100 %] 70 % (01/07 1200) Weight:  [73.9 kg-79 kg] 79 kg (01/07 0915)  Physical Examination:  GENERAL:critically ill appearing, +resp distress HEAD: Normocephalic, atraumatic.  EYES: Pupils equal,  round, reactive to light.  No scleral icterus.  MOUTH: Moist mucosal membrane. NECK: Supple. No JVD.  PULMONARY: +rhonchi, +wheezing CARDIOVASCULAR: S1 and S2. Regular rate and rhythm. No murmurs, rubs, or gallops.  GASTROINTESTINAL: Soft, nontender, -distended. No masses. Positive bowel sounds. No hepatosplenomegaly.  MUSCULOSKELETAL: No swelling, clubbing, or edema.  NEUROLOGIC: obtunded SKIN:intact,warm,dry  I personally reviewed lab work that was obtained in last 24 hrs. CXR Independently reviewed-b/l edema  interstitial infiltrates   ASSESSMENT / PLAN: 78 yo female with acute and severe resp failure from acute systolic CHF exacerbation with COPD exacerbation with severe metabolic acidosis  Severe Hypoxic and Hypercapnic Respiratory Failure -continue Mechanical Ventilator support -continue Bronchodilator Therapy -Wean Fio2 and PEEP as tolerated -VAP/VENT bundle implementation -will perform SAT/SBT when respiratory parameters are met   CARDIAC FAILURE-sCHF EF 45% -oxygen as needed -Lasix as tolerated -follow up cardiac enzymes as indicated Stop ABX    GI/Nutrition GI PROPHYLAXIS as indicated DIET-->TF's as tolerated Constipation protocol as indicated  ELECTROLYTES -follow labs as needed -replace as needed -pharmacy consultation and following   NEUROLOGY - intubated and sedated - minimal sedation to achieve a RASS goal: -1   ENDO - ICU hypoglycemic\Hyperglycemia protocol -check FSBS per protocol     DVT/GI PRX ordered TRANSFUSIONS AS NEEDED MONITOR FSBS ASSESS the need for LABS as needed    Critical Care Time devoted to patient care services described in this note is 46 minutes.   Overall, patient is critically ill, prognosis is guarded.  Patient with Multiorgan failure and at high risk for cardiac arrest and death.   Will update family when available   Corrin Parker, M.D.  [facility]  Medical Director [place] Director [facility] Cardio-Pulmonary Department

## 2019-01-02 NOTE — Progress Notes (Signed)
Patient ID: Brittney [name], female   DOB: May 19, 1941, 78 y.o.   MRN: [id number]  [employer] PROGRESS NOTE  Brittney [name] GXQ:[id number] DOB: 1941/01/09 DOA: 01/02/2019 PCP: Elisabeth Cara, NP  HPI/Subjective: Patient intubated and sedated.  As per family patient became short of breath last night.  Objective: Vitals:   01/02/19 1600 01/02/19 1700  BP: (!) 88/59 96/66  Pulse: (!) 112 (!) 110  Resp: (!) 26 (!) 24  Temp: 97.6 F (36.4 C)   SpO2: 97% 99%    Filed Weights   01/02/19 0413 01/02/19 0915  Weight: 73.9 kg 79 kg    ROS: Review of Systems  Unable to perform ROS: Acuity of condition   Exam: Physical Exam  HENT:  Nose: No mucosal edema.  Unable to look into mouth  Eyes: Pupils are equal, round, and reactive to light. Conjunctivae and lids are normal.  Neck: No JVD present. Carotid bruit is not present. No edema present. No thyroid mass and no thyromegaly present.  Cardiovascular: S1 normal and S2 normal. Exam reveals no gallop.  No murmur heard. Pulses:      Dorsalis pedis pulses are 2+ on the right side and 2+ on the left side.  Respiratory: No respiratory distress. She has decreased breath sounds in the right lower field and the left lower field. She has no wheezes. She has no rhonchi. She has rales in the right lower field and the left lower field.  GI: Soft. Bowel sounds are normal. There is no abdominal tenderness.  Musculoskeletal:     Right ankle: She exhibits no swelling.     Left ankle: She exhibits no swelling.  Lymphadenopathy:    She has no cervical adenopathy.  Neurological:  Intubated and sedated  Skin: Skin is warm. No rash noted. Nails show no clubbing.  Psychiatric:  Intubated and sedated      Data Reviewed: Basic Metabolic Panel: Recent Labs  Lab 01/02/19 0432  NA 139  K 3.9  CL 105  CO2 22  GLUCOSE 271*  BUN 17  CREATININE 0.97  CALCIUM 9.0   Liver Function Tests: Recent Labs  Lab 01/02/19 0432  AST 19  ALT 14   ALKPHOS 55  BILITOT 0.8  PROT 7.3  ALBUMIN 3.8  CBC: Recent Labs  Lab 01/02/19 0432  WBC 19.5*  NEUTROABS 12.4*  HGB 12.7  HCT 39.9  MCV 83.3  PLT 395   Cardiac Enzymes: Recent Labs  Lab 01/02/19 0432 01/02/19 0725 01/02/19 1044  TROPONINI 0.04* 0.14* 0.28*   BNP (last 3 results) Recent Labs    08/08/18 0409 08/23/18 0114 01/02/19 0432  BNP 1,069.0* 507.0* 258.0*   Patient has come here for acute chronic CBG: Recent Labs  Lab 01/02/19 0738 01/02/19 0919 01/02/19 1407  GLUCAP 389* 411* 342*    Recent Results (from the past 240 hour(s))  MRSA PCR Screening     Status: None   Collection Time: 01/02/19  9:23 AM  Result Value Ref Range Status   MRSA by PCR NEGATIVE NEGATIVE Final    Comment:        The GeneXpert MRSA Assay (FDA approved for NASAL specimens only), is one component of a comprehensive MRSA colonization surveillance program. It is not intended to diagnose MRSA infection nor to guide or monitor treatment for MRSA infections. Performed at [facility], [address], [place], [place] [address]      Studies: Dg Abd 1 View  Result Date: 01/02/2019 CLINICAL DATA:  Status  post nasogastric tube placement. EXAM: ABDOMEN - 1 VIEW COMPARISON:  Radiograph of August 07, 2018. FINDINGS: The bowel gas pattern is normal. Distal tip of nasogastric tube is seen in expected position of proximal stomach. No radio-opaque calculi or other significant radiographic abnormality are seen. IMPRESSION: Distal tip of nasogastric tube seen in expected position of proximal stomach. No evidence of bowel obstruction or ileus. Electronically Signed   By: Marijo Conception, M.D.   On: 01/02/2019 10:31   Ct Angio Chest Pe W Or Wo Contrast  Result Date: 01/02/2019 CLINICAL DATA:  Respiratory distress, intubated EXAM: CT ANGIOGRAPHY CHEST WITH CONTRAST TECHNIQUE: Multidetector CT imaging of the chest was performed using the standard protocol during bolus administration  of intravenous contrast. Multiplanar CT image reconstructions and MIPs were obtained to evaluate the vascular anatomy. CONTRAST:  65mL OMNIPAQUE IOHEXOL 350 MG/ML SOLN COMPARISON:  Chest x-ray from earlier in the same day. FINDINGS: Cardiovascular: Thoracic aorta and its branches demonstrate atherosclerotic calcifications. No aneurysmal dilatation or dissection is seen. No cardiac enlargement is noted. Coronary calcifications are identified. Pulmonary artery shows a normal branching pattern without intraluminal filling defect to suggest pulmonary embolism. Evaluation of the peripheral most branches in the lower lobes is limited due to significant consolidation related to the underlying edema. Mediastinum/Nodes: Thoracic inlet is within normal limits. No significant hilar or mediastinal adenopathy is noted. The esophagus is within normal limits. Nasogastric catheter extends into the stomach. Endotracheal tube is noted in satisfactory position above the carina Lungs/Pleura: There is diffuse alveolar density identified with consolidation primarily within the lower lobes. This is consistent with pulmonary edema given the abrupt onset from earlier in the same day. Diffuse interstitial edema is noted as well. Small effusions are noted bilaterally. No focal parenchymal nodules or mass lesions are seen although a significant amount of the lower lobes is obscured due to the consolidation. Upper Abdomen: Visualized upper abdomen shows no acute abnormality. Musculoskeletal: No acute bony abnormality is noted. Mild degenerative changes of the thoracic spine are seen. Review of the MIP images confirms the above findings. IMPRESSION: Changes consistent with CHF with significant acute alveolar and interstitial edema identified. Consolidation is noted within the lower lobes bilaterally related to significant edematous change. No evidence of pulmonary emboli. Small effusions bilaterally. Aortic Atherosclerosis (ICD10-I70.0).  Electronically Signed   By: Inez Catalina M.D.   On: 01/02/2019 09:13   Dg Chest Port 1 View  Result Date: 01/02/2019 CLINICAL DATA:  Status post intubation. EXAM: PORTABLE CHEST 1 VIEW COMPARISON:  Radiograph of same day. FINDINGS: Stable cardiomediastinal silhouette. Atherosclerosis of thoracic aorta is noted. Endotracheal tube is seen projected over tracheal air shadow with distal tip 4 cm above the carina. Nasogastric tube is seen entering the stomach. Bilateral pulmonary edema is noted. Minimal pleural effusions are noted. No pneumothorax is noted. Bony thorax is unremarkable. IMPRESSION: Endotracheal tube in grossly good position. Stable probable bilateral pulmonary edema. Aortic Atherosclerosis (ICD10-I70.0). Electronically Signed   By: Marijo Conception, M.D.   On: 01/02/2019 10:30   Dg Chest Port 1 View  Result Date: 01/02/2019 CLINICAL DATA:  Initial evaluation for acute worsening shortness of breath. EXAM: PORTABLE CHEST 1 VIEW COMPARISON:  Prior radiograph from a earlier the same day. FINDINGS: Cardiac and mediastinal silhouettes are grossly stable in size and contour. Aortic atherosclerosis. Lungs normally inflated. Market interval worsening in extensive fairly symmetric bilateral airspace opacities, likely reflecting edema given the rapid nature of change. No appreciable pleural effusion. No pneumothorax. Superimposed infiltrates would  be difficult to exclude. Osseous structures demonstrate no acute finding. IMPRESSION: Marked interval worsening in extensive fairly symmetric bilateral airspace opacities, likely reflecting edema given the rapid nature of change. Electronically Signed   By: Jeannine Boga M.D.   On: 01/02/2019 06:00   Dg Chest Port 1 View  Result Date: 01/02/2019 CLINICAL DATA:  Shortness of breath. EXAM: PORTABLE CHEST 1 VIEW COMPARISON:  08/24/2018 FINDINGS: Hazy and fine interstitial opacity mildly asymmetric to the left. Mild cardiomegaly. Stable mediastinal contours. No  effusion or pneumothorax. IMPRESSION: Interstitial and airspace opacity that is likely edema based on prior radiographs. Electronically Signed   By: Monte Fantasia M.D.   On: 01/02/2019 04:39    Scheduled Meds: . amLODipine  10 mg Oral Daily  . aspirin  81 mg Oral Daily  . atorvastatin  40 mg Oral Daily  . [START ON 01/03/2019] enoxaparin (LOVENOX) injection  40 mg Subcutaneous Q24H  . furosemide  40 mg Intravenous BID  . insulin aspart  0-15 Units Subcutaneous TID WC  . insulin aspart  0-5 Units Subcutaneous QHS  . ipratropium-albuterol  3 mL Nebulization Q4H  . losartan  100 mg Oral Daily  . methylPREDNISolone (SOLU-MEDROL) injection  60 mg Intravenous Q12H   Followed by  . [START ON 01/04/2019] predniSONE  40 mg Oral Q breakfast  .  morphine injection  2 mg Intravenous Once  . ondansetron (ZOFRAN) IV  4 mg Intravenous Once  . pantoprazole  40 mg Oral Daily   Continuous Infusions: . phenylephrine (NEO-SYNEPHRINE) Adult infusion 50 mcg/min (01/02/19 1536)  . propofol (DIPRIVAN) infusion 60 mcg/kg/min (01/02/19 1451)  . sodium chloride Stopped (01/02/19 0543)    Assessment/Plan:  1. Acute hypoxic respiratory failure.  When I saw the patient she was on 60% FiO2 on the ventilator.  Critical care team to manage ventilator. 2. Shock.  Patient on Neo-Synephrine.  Possibly infectious versus cardiogenic.  Patient empirically on antibiotics and steroids 3. Acute diastolic congestive heart failure.  Patient was started on Lasix.  May be limited secondary to blood pressure 4. Metabolic acidosis, lactic acidosis patient 5. Hyperlipidemia on atorvastatin  Code Status:     Code Status Orders  (From admission, onward)         Start     Ordered   01/02/19 0709  Full code  Continuous     01/02/19 0708        Code Status History    Date Active Date Inactive Code Status Order ID Comments User Context   08/23/2018 0349 08/25/2018 1431 Full Code [id number]  Harrie Foreman, MD ED    08/05/2018 1759 08/11/2018 2038 Full Code [id number]  [name], MD Inpatient     Family Communication: Family at the bedside Disposition Plan: To be determined  Consultants:  Cardiology  Critical care  Antibiotics:  Rocephin  Zithromax  Time spent: 35 minutes  [place]

## 2019-01-02 NOTE — ED Notes (Signed)
Ph 7.14. dr Earleen Newport notified.

## 2019-01-02 NOTE — ED Notes (Signed)
Waiting on transport up to ICU

## 2019-01-02 NOTE — Progress Notes (Signed)
CODE SEPSIS - PHARMACY COMMUNICATION  **Broad Spectrum Antibiotics should be administered within 1 hour of Sepsis diagnosis**  Time Code Sepsis Called/Page Received: [id number] [id number]  Antibiotics Ordered: 0107 0419  Time of 1st antibiotic administration: 5369 2230  Additional action taken by pharmacy:   If necessary, Name of Provider/Nurse Contacted:     Eloise Harman ,PharmD Clinical Pharmacist  01/02/2019  5:26 AM

## 2019-01-02 NOTE — Consult Note (Signed)
Cardiology Consultation Note    Patient ID: Brittney [name], MRN: [id number], DOB/AGE: Mar 07, 1941 78 y.o. Admit date: 01/02/2019   Date of Consult: 01/02/2019 Primary Physician: Elisabeth Cara, NP Primary Cardiologist: Dr. Clayborn Bigness  Chief Complaint: sob Reason for Consultation: [name] Requesting MD: Dr. Mortimer Fries  HPI: Brittney [name] is a 78 y.o. female with history of diabetes mellitus, hyperlipidemia, hypertension as well as COPD presented to the emergency room with complaints of acute shortness of breath.  She was noted to have acute hypoxemic respiratory failure with acute COPD exacerbation.  Chest x-ray suggested possible flash pulmonary edema.  She was intubated in the emergency room due to respiratory failure.  Chest x-ray revealed bilateral pulmonary edema.  Chest CT revealed no pulmonary emboli with small bilateral pleural effusions.  EKG revealed sinus tachycardia.  Patient is intubated and not able to give a history.  Patient's family member reports rather sudden onset of her symptoms.  She had fairly progressive onset of increasing shortness of breath.  She has ruled out for myocardial infarction.  Echo revealed mildly reduced LV function further reduced from previous echo done in August of this year.  At that time her ejection fraction was read as being normal.  Echo on this occasion showed an EF of 45 to 50%.  She is currently relatively hemodynamically stable.  She is currently +182 cc since admission.  Past Medical History:  Diagnosis Date  . Diabetes mellitus without complication ([place])   . GERD (gastroesophageal reflux disease)   . Hyperlipidemia   . Hypertension   . Vitamin D deficiency       Surgical History:  Past Surgical History:  Procedure Laterality Date  . COLONOSCOPY       Home Meds: Prior to Admission medications   Medication Sig Start Date End Date Taking? Authorizing Provider  amLODipine (NORVASC) 10 MG tablet Take 10 mg by mouth daily.   Yes [provider]  aspirin 81 MG tablet Take 81 mg by mouth daily.   Yes [provider]  atorvastatin (LIPITOR) 40 MG tablet Take 1 tablet by mouth daily. 07/04/18  Yes [provider]  cetirizine (ZYRTEC) 10 MG tablet Take 10 mg by mouth daily.    Yes [provider]  Choline Fenofibrate 45 MG capsule Take 90 mg by mouth daily.   Yes [provider]  Liraglutide (VICTOZA) 18 MG/3ML SOPN Inject 1.8 mg into the skin every morning.   Yes [provider]  losartan (COZAAR) 100 MG tablet Take 100 mg by mouth daily.   Yes [provider]  metFORMIN (GLUCOPHAGE) 1000 MG tablet Take 1,000 mg by mouth 2 (two) times daily with a meal.   Yes [provider]  omeprazole (PRILOSEC) 20 MG capsule Take 20 mg by mouth daily.   Yes [provider]  ranitidine (ZANTAC) 150 MG tablet Take 1 tablet by mouth 2 (two) times daily. 05/06/18  Yes [provider]  Semaglutide (OZEMPIC, 0.25 OR 0.5 MG/DOSE, [place]) Inject 0.25 mg into the skin once a week. For 4 weeks   Yes [provider]  sitaGLIPtin (JANUVIA) 100 MG tablet Take 100 mg by mouth daily.   Yes [provider]  glipiZIDE (GLUCOTROL XL) 10 MG 24 hr tablet Take 10 mg by mouth 2 (two) times daily.    [provider]  guaiFENesin-dextromethorphan (ROBITUSSIN DM) 100-10 MG/5ML syrup Take 5 mLs by mouth every 4 (four) hours as needed for cough. Patient not taking: Reported on  01/02/2019 08/25/18   [name], MD  metoprolol tartrate (LOPRESSOR) 50 MG tablet Take 1 tablet (50 mg total) by mouth 2 (two) times daily. 08/11/18 10/10/18  [name], MD    Inpatient Medications:  . aspirin  81 mg Oral Daily  . atorvastatin  40 mg Oral Daily  . [START ON 01/03/2019] enoxaparin (LOVENOX) injection  40 mg Subcutaneous Q24H  . furosemide  40 mg Intravenous BID  . insulin aspart  0-15 Units Subcutaneous TID WC  . insulin aspart  0-5 Units Subcutaneous QHS  .  ipratropium-albuterol  3 mL Nebulization Q4H  . methylPREDNISolone (SOLU-MEDROL) injection  60 mg Intravenous Q12H   Followed by  . [START ON 01/04/2019] predniSONE  40 mg Oral Q breakfast  .  morphine injection  2 mg Intravenous Once  . ondansetron (ZOFRAN) IV  4 mg Intravenous Once  . pantoprazole  40 mg Oral Daily   . phenylephrine (NEO-SYNEPHRINE) Adult infusion 50 mcg/min (01/02/19 1536)  . propofol (DIPRIVAN) infusion 60 mcg/kg/min (01/02/19 1451)  . sodium chloride Stopped (01/02/19 0543)    Allergies:  Allergies  Allergen Reactions  . Accupril [Quinapril Hcl]     Social History   Socioeconomic History  . Marital status: Widowed    Spouse name: Not on file  . Number of children: Not on file  . Years of education: Not on file  . Highest education level: Not on file  Occupational History  . Not on file  Social Needs  . Financial resource strain: Not on file  . Food insecurity:    Worry: Not on file    Inability: Not on file  . Transportation needs:    Medical: Not on file    Non-medical: Not on file  Tobacco Use  . Smoking status: Current Every Day Smoker    Packs/day: 1.00    Years: 40.00    Pack years: 40.00    Types: Cigarettes  . Smokeless tobacco: Never Used  Substance and Sexual Activity  . Alcohol use: No  . Drug use: No  . Sexual activity: Not on file  Lifestyle  . Physical activity:    Days per week: Not on file    Minutes per session: Not on file  . Stress: Not on file  Relationships  . Social connections:    Talks on phone: Not on file    Gets together: Not on file    Attends religious service: Not on file    Active member of club or organization: Not on file    Attends meetings of clubs or organizations: Not on file    Relationship status: Not on file  . Intimate partner violence:    Fear of current or ex partner: Not on file    Emotionally abused: Not on file    Physically abused: Not on file    Forced sexual activity: Not on file   Other Topics Concern  . Not on file  Social History Narrative  . Not on file     Family History  Problem Relation Age of Onset  . Diabetes Neg Hx   . Hypertension Neg Hx      Review of Systems: A 12-system review of systems was performed and is negative except as noted in the HPI.  Labs: Recent Labs    01/02/19 0432 01/02/19 0725 01/02/19 1044  TROPONINI 0.04* 0.14* 0.28*   Lab Results  Component Value Date   WBC 19.5 (H) 01/02/2019   HGB 12.7 01/02/2019  HCT 39.9 01/02/2019   MCV 83.3 01/02/2019   PLT 395 01/02/2019    Recent Labs  Lab 01/02/19 0432  NA 139  K 3.9  CL 105  CO2 22  BUN 17  CREATININE 0.97  CALCIUM 9.0  PROT 7.3  BILITOT 0.8  ALKPHOS 55  ALT 14  AST 19  GLUCOSE 271*   No results found for: CHOL, HDL, LDLCALC, TRIG No results found for: DDIMER  Radiology/Studies:  Dg Abd 1 View  Result Date: 01/02/2019 CLINICAL DATA:  Status post nasogastric tube placement. EXAM: ABDOMEN - 1 VIEW COMPARISON:  Radiograph of August 07, 2018. FINDINGS: The bowel gas pattern is normal. Distal tip of nasogastric tube is seen in expected position of proximal stomach. No radio-opaque calculi or other significant radiographic abnormality are seen. IMPRESSION: Distal tip of nasogastric tube seen in expected position of proximal stomach. No evidence of bowel obstruction or ileus. Electronically Signed   By: Marijo Conception, M.D.   On: 01/02/2019 10:31   Ct Angio Chest Pe W Or Wo Contrast  Result Date: 01/02/2019 CLINICAL DATA:  Respiratory distress, intubated EXAM: CT ANGIOGRAPHY CHEST WITH CONTRAST TECHNIQUE: Multidetector CT imaging of the chest was performed using the standard protocol during bolus administration of intravenous contrast. Multiplanar CT image reconstructions and MIPs were obtained to evaluate the vascular anatomy. CONTRAST:  28mL OMNIPAQUE IOHEXOL 350 MG/ML SOLN COMPARISON:  Chest x-ray from earlier in the same day. FINDINGS: Cardiovascular: Thoracic  aorta and its branches demonstrate atherosclerotic calcifications. No aneurysmal dilatation or dissection is seen. No cardiac enlargement is noted. Coronary calcifications are identified. Pulmonary artery shows a normal branching pattern without intraluminal filling defect to suggest pulmonary embolism. Evaluation of the peripheral most branches in the lower lobes is limited due to significant consolidation related to the underlying edema. Mediastinum/Nodes: Thoracic inlet is within normal limits. No significant hilar or mediastinal adenopathy is noted. The esophagus is within normal limits. Nasogastric catheter extends into the stomach. Endotracheal tube is noted in satisfactory position above the carina Lungs/Pleura: There is diffuse alveolar density identified with consolidation primarily within the lower lobes. This is consistent with pulmonary edema given the abrupt onset from earlier in the same day. Diffuse interstitial edema is noted as well. Small effusions are noted bilaterally. No focal parenchymal nodules or mass lesions are seen although a significant amount of the lower lobes is obscured due to the consolidation. Upper Abdomen: Visualized upper abdomen shows no acute abnormality. Musculoskeletal: No acute bony abnormality is noted. Mild degenerative changes of the thoracic spine are seen. Review of the MIP images confirms the above findings. IMPRESSION: Changes consistent with CHF with significant acute alveolar and interstitial edema identified. Consolidation is noted within the lower lobes bilaterally related to significant edematous change. No evidence of pulmonary emboli. Small effusions bilaterally. Aortic Atherosclerosis (ICD10-I70.0). Electronically Signed   By: Inez Catalina M.D.   On: 01/02/2019 09:13   Dg Chest Port 1 View  Result Date: 01/02/2019 CLINICAL DATA:  Status post intubation. EXAM: PORTABLE CHEST 1 VIEW COMPARISON:  Radiograph of same day. FINDINGS: Stable cardiomediastinal  silhouette. Atherosclerosis of thoracic aorta is noted. Endotracheal tube is seen projected over tracheal air shadow with distal tip 4 cm above the carina. Nasogastric tube is seen entering the stomach. Bilateral pulmonary edema is noted. Minimal pleural effusions are noted. No pneumothorax is noted. Bony thorax is unremarkable. IMPRESSION: Endotracheal tube in grossly good position. Stable probable bilateral pulmonary edema. Aortic Atherosclerosis (ICD10-I70.0). Electronically Signed   By: Jeneen Rinks  Murlean Caller, M.D.   On: 01/02/2019 10:30   Dg Chest Port 1 View  Result Date: 01/02/2019 CLINICAL DATA:  Initial evaluation for acute worsening shortness of breath. EXAM: PORTABLE CHEST 1 VIEW COMPARISON:  Prior radiograph from a earlier the same day. FINDINGS: Cardiac and mediastinal silhouettes are grossly stable in size and contour. Aortic atherosclerosis. Lungs normally inflated. Market interval worsening in extensive fairly symmetric bilateral airspace opacities, likely reflecting edema given the rapid nature of change. No appreciable pleural effusion. No pneumothorax. Superimposed infiltrates would be difficult to exclude. Osseous structures demonstrate no acute finding. IMPRESSION: Marked interval worsening in extensive fairly symmetric bilateral airspace opacities, likely reflecting edema given the rapid nature of change. Electronically Signed   By: Jeannine Boga M.D.   On: 01/02/2019 06:00   Dg Chest Port 1 View  Result Date: 01/02/2019 CLINICAL DATA:  Shortness of breath. EXAM: PORTABLE CHEST 1 VIEW COMPARISON:  08/24/2018 FINDINGS: Hazy and fine interstitial opacity mildly asymmetric to the left. Mild cardiomegaly. Stable mediastinal contours. No effusion or pneumothorax. IMPRESSION: Interstitial and airspace opacity that is likely edema based on prior radiographs. Electronically Signed   By: Monte Fantasia M.D.   On: 01/02/2019 04:39    Wt Readings from Last 3 Encounters:  01/02/19 79 kg   08/25/18 76.8 kg  08/07/18 75.5 kg    EKG: sinus tachycardia  Physical Exam:  Blood pressure 96/66, pulse (!) 110, temperature 97.6 F (36.4 C), temperature source Oral, resp. rate (!) 24, height 5\' 6"  (1.676 m), weight 79 kg, SpO2 99 %. Body mass index is 28.11 kg/m. General: Well developed, well nourished, in no acute distress. Head: Normocephalic, atraumatic, sclera non-icteric, no xanthomas, nares are without discharge.  Neck: Negative for carotid bruits. JVD not elevated. Lungs: Clear bilaterally to auscultation without wheezes, rales, or rhonchi. Breathing is unlabored. Heart: tachycardic with S1 S2. No murmurs, rubs, or gallops appreciated. Abdomen: Soft, non-tender, non-distended with normoactive bowel sounds. No hepatomegaly. No rebound/guarding. No obvious abdominal masses. Msk:  Strength and tone appear normal for age. Extremities: 1-2 + edema Neuro: Intubated and sedated      Assessment and Plan  Patient is a 78 year old female with history of diabetes, hypertension, hyperlipidemia, COPD admitted with acute hypoxemic respiratory failure.  Chest x-ray suggested flash pulmonary edema.  Echocardiogram revealed reduced LV function from previous echo done recently.  Ejection fraction felt to be 45 to 50% with previous echo read as 60 to 65%.  She required intubation in the emergency room.  She is currently intubated and sedated not able to give history.  Patient's daughter states that she had fairly acute onset of her symptoms.  Respiratory failure-likely due at least in part to congestive heart failure.  Patient does have mild reduced LV function.  We will continue to carefully diurese following renal function and hemodynamics.  Troponin is mildly elevated.  This may do be demand ischemia.  When stable from a respiratory therapy standpoint further ischemic evaluation may be necessary  CHF-as per above, ejection fraction is reduced to 45 to 50% from previous of 60 to 65%.  No  regional wall motion abnormality.  Troponin minimally elevated.  We will continue to attempt to wean vent carefully diurese following renal function, exam and chest x-ray.  Signed, Teodoro Spray MD 01/02/2019, 5:37 PM Pager: [phone]

## 2019-01-02 NOTE — Care Management Note (Addendum)
Case Management Note  Patient Details  Name: Brittney [name] MRN: [id number] Date of Birth: [birth year]  Subjective/Objective:     Patient admitted with acute hypoxic respiratory failure.  Patient is currently intubated and sedated.  Patient's daughter Otila Kluver is at the bedside.  She reports that her mother was hospitalized back in August for something similar.  Otila Kluver reports that her mother has been fine but got up to go to the bathroom yesterday and then couldn't catch her breath so there daughter Belenda Cruise got her to the ED.  Patient lives with daughter Belenda Cruise, in all patient has 4 daughters and they provide transportation for their mother, she has never had a drivers license.   Patient has been independent, patient has a walker, cane, bedside commode and shower bench available at home.  Patient had home health when discharged from the hospital in August [facility] was with [facility], daughter Otila Kluver says they would like to use [facility] again if home health needed.  PCP verified as [facility] and pharmacy is [facility].  RNCM will cont to follow. Nadine Counts RN BSN [phone]                 Action/Plan:   Expected Discharge Date:                  Expected Discharge Plan:  [place]  In-House Referral:     Discharge planning Services  CM Consult  Post Acute Care Choice:    Choice offered to:     DME Arranged:    DME Agency:     HH Arranged:    [place] Agency:  [place]  Status of Service:  In process, will continue to follow  If discussed at Long Length of Stay Meetings, dates discussed:    Additional Comments:  Shelbie Hutching, RN 01/02/2019, 2:47 PM

## 2019-01-02 NOTE — Progress Notes (Signed)
Patient transported to CT and then too CCU on transport ventilator.  Patient tolerated well.

## 2019-01-02 NOTE — H&P (Addendum)
[facility] [place] NAME: Brittney [name]    MR#:  [id number]  DATE OF BIRTH:  [birth year]  DATE OF ADMISSION:  01/02/2019  PRIMARY CARE PHYSICIAN: Elisabeth Cara, NP   REQUESTING/REFERRING PHYSICIAN: Paulette Blanch, MD  CHIEF COMPLAINT:  No chief complaint on file.   HISTORY OF PRESENT ILLNESS:  Brittney [name]  is a 78 y.o. female with a known history of T2NIDDM, HTN, HLD, COPD p/w acute hypoxemic respiratory failure, acute COPD exacerbation, acute bacterial community acquired pneumonia, leukocytosis, SIRS, sepsis, flash pulmonary edema, suspect acute congestive heart failure exacerbation (new-onset). Intubated in ED prior to my assessment, Hx/ROS unobtainable. Minimal Hx from pt's daughter at bedside. Pt was Dx w/ COPD in summer 2019. I am told she has had recurrent pneumonia since then. p/w cough/SOB x~3d. Not on home O2. Was wheezing on arrival to ED. SIRS (+). Initial CXR read as edema, though I favor consolidation over edema. (+) sepsis 2/2 CAP, (+) acute COPD exacerbation; received nebs, steroids, ABx in ED. Started on fluids, and developed flash pulmonary edema, hypoxia; intubated. No known cardiac Hx.  PAST MEDICAL HISTORY:   Past Medical History:  Diagnosis Date  . Diabetes mellitus without complication ([place])   . GERD (gastroesophageal reflux disease)   . Hyperlipidemia   . Hypertension   . Vitamin D deficiency     PAST SURGICAL HISTORY:   Past Surgical History:  Procedure Laterality Date  . COLONOSCOPY      SOCIAL HISTORY:   Social History   Tobacco Use  . Smoking status: Current Every Day Smoker    Packs/day: 1.00    Years: 40.00    Pack years: 40.00    Types: Cigarettes  . Smokeless tobacco: Never Used  Substance Use Topics  . Alcohol use: No    FAMILY HISTORY:   Family History  Problem Relation Age of Onset  . Diabetes Neg Hx   . Hypertension Neg Hx     DRUG ALLERGIES:   Allergies  Allergen  Reactions  . Accupril [Quinapril Hcl]     REVIEW OF SYSTEMS:   Review of Systems  Unable to perform ROS: Intubated  Respiratory: Positive for cough, shortness of breath and wheezing.    MEDICATIONS AT HOME:   Prior to Admission medications   Medication Sig Start Date End Date Taking? Authorizing Provider  amLODipine (NORVASC) 10 MG tablet Take 10 mg by mouth daily.   Yes [provider]  aspirin 81 MG tablet Take 81 mg by mouth daily.   Yes [provider]  atorvastatin (LIPITOR) 40 MG tablet Take 1 tablet by mouth daily. 07/04/18  Yes [provider]  cetirizine (ZYRTEC) 10 MG tablet Take 10 mg by mouth daily.    Yes [provider]  Choline Fenofibrate 45 MG capsule Take 90 mg by mouth daily.   Yes [provider]  Liraglutide (VICTOZA) 18 MG/3ML SOPN Inject 1.8 mg into the skin every morning.   Yes [provider]  losartan (COZAAR) 100 MG tablet Take 100 mg by mouth daily.   Yes [provider]  metFORMIN (GLUCOPHAGE) 1000 MG tablet Take 1,000 mg by mouth 2 (two) times daily with a meal.   Yes [provider]  omeprazole (PRILOSEC) 20 MG capsule Take 20 mg by mouth daily.   Yes [provider]  ranitidine (ZANTAC) 150 MG tablet Take 1 tablet by mouth 2 (two) times daily. 05/06/18  Yes [provider]  Semaglutide (OZEMPIC, 0.25 OR 0.5 MG/DOSE, [place]) Inject 0.25 mg into the skin once a week. For 4 weeks   Yes [provider]  sitaGLIPtin (JANUVIA) 100 MG tablet Take 100 mg by mouth daily.   Yes [provider]  glipiZIDE (GLUCOTROL XL) 10 MG 24 hr tablet Take 10 mg by mouth 2 (two) times daily.    [provider]  guaiFENesin-dextromethorphan (ROBITUSSIN DM) 100-10 MG/5ML syrup Take 5 mLs by mouth every 4 (four) hours as needed for cough. Patient not taking: Reported on 01/02/2019 08/25/18   [name], MD  metoprolol tartrate (LOPRESSOR) 50 MG tablet Take 1 tablet (50 mg  total) by mouth 2 (two) times daily. 08/11/18 10/10/18  [name], MD      VITAL SIGNS:  Blood pressure (!) 77/53, pulse (!) 122, temperature (!) 97.5 F (36.4 C), temperature source Axillary, resp. rate (!) 29, height 5\' 6"  (1.676 m), weight 79 kg, SpO2 97 %.  PHYSICAL EXAMINATION:  Physical Exam Constitutional:      General: She is not in acute distress.    Appearance: Normal appearance. She is normal weight. She is ill-appearing and toxic-appearing.  HENT:     Head: Atraumatic.  Eyes:     General: No scleral icterus.    Conjunctiva/sclera: Conjunctivae normal.  Neck:     Musculoskeletal: Neck supple.  Cardiovascular:     Rate and Rhythm: Regular rhythm. Tachycardia present.     Heart sounds: No murmur. No friction rub. No gallop.   Pulmonary:     Breath sounds: No stridor. Wheezing and rales present. No rhonchi.  Abdominal:     General: There is no distension.     Palpations: Abdomen is soft.     Tenderness: There is no abdominal tenderness. There is no guarding or rebound.  Musculoskeletal:        General: No swelling or tenderness.     Right lower leg: No edema.     Left lower leg: No edema.  Lymphadenopathy:     Cervical: No cervical adenopathy.  Skin:    General: Skin is warm and dry.     Findings: No erythema or rash.  Neurological:     Comments: Intubated/sedated.  Psychiatric:     Comments: Intubated/sedated.    LABORATORY PANEL:   CBC Recent Labs  Lab 01/02/19 0432  WBC 19.5*  HGB 12.7  HCT 39.9  PLT 395   ------------------------------------------------------------------------------------------------------------------  Chemistries  Recent Labs  Lab 01/02/19 0432  NA 139  K 3.9  CL 105  CO2 22  GLUCOSE 271*  BUN 17  CREATININE 0.97  CALCIUM 9.0  AST 19  ALT 14  ALKPHOS 55  BILITOT 0.8   ------------------------------------------------------------------------------------------------------------------  Cardiac Enzymes Recent  Labs  Lab 01/02/19 0725  TROPONINI 0.14*   ------------------------------------------------------------------------------------------------------------------  RADIOLOGY:  Dg Abd 1 View  Result Date: 01/02/2019 CLINICAL DATA:  Status post nasogastric tube placement. EXAM: ABDOMEN - 1 VIEW COMPARISON:  Radiograph of August 07, 2018. FINDINGS: The bowel gas pattern is normal. Distal tip of nasogastric tube is seen in expected position of proximal stomach. No radio-opaque calculi or other significant radiographic abnormality are seen. IMPRESSION: Distal tip of nasogastric tube seen in expected position of proximal stomach. No evidence of bowel obstruction or ileus. Electronically Signed   By: Marijo Conception, M.D.   On: 01/02/2019 10:31   Ct Angio Chest Pe W Or Wo Contrast  Result Date: 01/02/2019 CLINICAL DATA:  Respiratory distress, intubated EXAM: CT ANGIOGRAPHY  CHEST WITH CONTRAST TECHNIQUE: Multidetector CT imaging of the chest was performed using the standard protocol during bolus administration of intravenous contrast. Multiplanar CT image reconstructions and MIPs were obtained to evaluate the vascular anatomy. CONTRAST:  11mL OMNIPAQUE IOHEXOL 350 MG/ML SOLN COMPARISON:  Chest x-ray from earlier in the same day. FINDINGS: Cardiovascular: Thoracic aorta and its branches demonstrate atherosclerotic calcifications. No aneurysmal dilatation or dissection is seen. No cardiac enlargement is noted. Coronary calcifications are identified. Pulmonary artery shows a normal branching pattern without intraluminal filling defect to suggest pulmonary embolism. Evaluation of the peripheral most branches in the lower lobes is limited due to significant consolidation related to the underlying edema. Mediastinum/Nodes: Thoracic inlet is within normal limits. No significant hilar or mediastinal adenopathy is noted. The esophagus is within normal limits. Nasogastric catheter extends into the stomach. Endotracheal tube is  noted in satisfactory position above the carina Lungs/Pleura: There is diffuse alveolar density identified with consolidation primarily within the lower lobes. This is consistent with pulmonary edema given the abrupt onset from earlier in the same day. Diffuse interstitial edema is noted as well. Small effusions are noted bilaterally. No focal parenchymal nodules or mass lesions are seen although a significant amount of the lower lobes is obscured due to the consolidation. Upper Abdomen: Visualized upper abdomen shows no acute abnormality. Musculoskeletal: No acute bony abnormality is noted. Mild degenerative changes of the thoracic spine are seen. Review of the MIP images confirms the above findings. IMPRESSION: Changes consistent with CHF with significant acute alveolar and interstitial edema identified. Consolidation is noted within the lower lobes bilaterally related to significant edematous change. No evidence of pulmonary emboli. Small effusions bilaterally. Aortic Atherosclerosis (ICD10-I70.0). Electronically Signed   By: Inez Catalina M.D.   On: 01/02/2019 09:13   Dg Chest Port 1 View  Result Date: 01/02/2019 CLINICAL DATA:  Status post intubation. EXAM: PORTABLE CHEST 1 VIEW COMPARISON:  Radiograph of same day. FINDINGS: Stable cardiomediastinal silhouette. Atherosclerosis of thoracic aorta is noted. Endotracheal tube is seen projected over tracheal air shadow with distal tip 4 cm above the carina. Nasogastric tube is seen entering the stomach. Bilateral pulmonary edema is noted. Minimal pleural effusions are noted. No pneumothorax is noted. Bony thorax is unremarkable. IMPRESSION: Endotracheal tube in grossly good position. Stable probable bilateral pulmonary edema. Aortic Atherosclerosis (ICD10-I70.0). Electronically Signed   By: Marijo Conception, M.D.   On: 01/02/2019 10:30   Dg Chest Port 1 View  Result Date: 01/02/2019 CLINICAL DATA:  Initial evaluation for acute worsening shortness of breath.  EXAM: PORTABLE CHEST 1 VIEW COMPARISON:  Prior radiograph from a earlier the same day. FINDINGS: Cardiac and mediastinal silhouettes are grossly stable in size and contour. Aortic atherosclerosis. Lungs normally inflated. Market interval worsening in extensive fairly symmetric bilateral airspace opacities, likely reflecting edema given the rapid nature of change. No appreciable pleural effusion. No pneumothorax. Superimposed infiltrates would be difficult to exclude. Osseous structures demonstrate no acute finding. IMPRESSION: Marked interval worsening in extensive fairly symmetric bilateral airspace opacities, likely reflecting edema given the rapid nature of change. Electronically Signed   By: Jeannine Boga M.D.   On: 01/02/2019 06:00   Dg Chest Port 1 View  Result Date: 01/02/2019 CLINICAL DATA:  Shortness of breath. EXAM: PORTABLE CHEST 1 VIEW COMPARISON:  08/24/2018 FINDINGS: Hazy and fine interstitial opacity mildly asymmetric to the left. Mild cardiomegaly. Stable mediastinal contours. No effusion or pneumothorax. IMPRESSION: Interstitial and airspace opacity that is likely edema based on prior radiographs. Electronically Signed  By: Monte Fantasia M.D.   On: 01/02/2019 04:39   IMPRESSION AND PLAN:   A/P: 64F w/ PMHx T2NIDDM, HTN, HLD, COPD p/w cute hypoxemic respiratory failure, acute COPD exacerbation, acute bacterial community acquired pneumonia, leukocytosis, SIRS, sepsis, flash pulmonary edema, suspect acute congestive heart failure exacerbation (new-onset), intubated. Hyperglycemia + glycosuria (w/ T2NIDDM), troponin elevation, lactate elevation, proteineuria. -Acute hypoxemic respiratory failure, acute COPD exacerbation, acute bacterial community acquired pneumonia, leukocytosis, SIRS, sepsis, lactate elevation, flash pulmonary edema, suspect acute congestive heart failure exacerbation (new-onset), intubated: Nebs, steroids. Lactate 4.57, 5.61. Started on Ceftriaxone + Azithromycin in  ED; spectrum broadened based on acuity/severity of illness (Vanc + Zosyn + Azithro). Cx pending. IS, pulmonary toileting (as appropriate). Lasix, I&O, daily weight, fluid restriction. Echo. Trop-I, cardiac monitoring, pulse ox, O2. CTA chest (-) PE. -Hyperglycemia, T2NIDDM: SSI. -Troponin elevation: Tachycardia, sepsis, demand. Trend, monitor. c/w cardiac meds. -c/w home meds/formulary subs as tolerated. -FEN/GI: NPO. -DVT PPx: Lovenox. -Code status: Full code. -Disposition: Admission, > 2 midnights. High statistical probability of mortality. Daughter at bedside has been prepared for this possibility.   All the records are reviewed and case discussed with ED provider. Management plans discussed with the patient, family and they are in agreement.  CODE STATUS: Full code.  TOTAL TIME TAKING CARE OF THIS PATIENT: 75 minutes.    Arta Silence M.D on 01/02/2019 at 10:39 AM  Between 7am to 6pm - Pager - [phone]  After 6pm go to [email or web address] - Proofreader  [facility]  Office  [phone]  CC: Primary care physician; Elisabeth Cara, NP   Note: This dictation was prepared with Dragon dictation along with smaller phrase technology. Any transcriptional errors that result from this process are unintentional.

## 2019-01-02 NOTE — ED Notes (Signed)
Date and time results received: 01/02/19 0539 (use smartphrase ".now" to insert current time)  Test: Troponin Critical Value: 0.04  Name of Provider Notified:Dr. Beather Arbour  Orders Received? Or Actions Taken?:

## 2019-01-02 NOTE — Progress Notes (Signed)
*  PRELIMINARY RESULTS* Echocardiogram 2D Echocardiogram has been performed.  Brittney [name] 01/02/2019, 10:08 AM

## 2019-01-02 NOTE — ED Provider Notes (Signed)
[facility] Emergency Department Provider Note   ____________________________________________   First MD Initiated Contact with Patient 01/02/19 [phone]     (approximate)  I have reviewed the triage vital signs and the nursing notes.   HISTORY  Chief Complaint Respiratory distress   HPI Brittney [name] is a 78 y.o. female brought to the ED from home via EMS with a chief complaint of respiratory distress.  Patient's daughter states she was diagnosed with COPD in August 2019.  Has had repeated bouts of pneumonia.  She is not on home oxygen.  Complains of a several day history of nonproductive cough and progressive shortness of breath.  EMS reports room air saturation 79% upon their arrival.  She was given 2 duo nebs en route.  Patient denies fever, chills, chest pain, abdominal pain, nausea, vomiting or diarrhea.  Denies recent travel or trauma.   Past Medical History:  Diagnosis Date  . Diabetes mellitus without complication ([place])   . GERD (gastroesophageal reflux disease)   . Hyperlipidemia   . Hypertension   . Vitamin D deficiency     Patient Active Problem List   Diagnosis Date Noted  . HCAP (healthcare-associated pneumonia) 08/23/2018  . Acute on chronic respiratory failure with hypoxia and hypercapnia ([place]) 08/05/2018    Past Surgical History:  Procedure Laterality Date  . COLONOSCOPY      Prior to Admission medications   Medication Sig Start Date End Date Taking? Authorizing Provider  albuterol (PROVENTIL HFA;VENTOLIN HFA) 108 (90 BASE) MCG/ACT inhaler Inhale 2 puffs into the lungs every 6 (six) hours as needed for wheezing or shortness of breath.     [provider]  amLODipine (NORVASC) 10 MG tablet Take 10 mg by mouth daily.    [provider]  aspirin 81 MG tablet Take 81 mg by mouth daily.    [provider]  atorvastatin (LIPITOR) 40 MG tablet Take 1 tablet by mouth daily. 07/04/18   [provider]    cetirizine (ZYRTEC) 10 MG tablet Take 10 mg by mouth daily.     [provider]  glipiZIDE (GLUCOTROL XL) 10 MG 24 hr tablet Take 10 mg by mouth 2 (two) times daily.    [provider]  guaiFENesin-dextromethorphan (ROBITUSSIN DM) 100-10 MG/5ML syrup Take 5 mLs by mouth every 4 (four) hours as needed for cough. 08/25/18   [name], MD  Liraglutide (VICTOZA) 18 MG/3ML SOPN Inject 1.8 mg into the skin every morning.    [provider]  losartan (COZAAR) 100 MG tablet Take 100 mg by mouth daily.    [provider]  metFORMIN (GLUCOPHAGE) 1000 MG tablet Take 1,000 mg by mouth 2 (two) times daily with a meal.    [provider]  metoprolol tartrate (LOPRESSOR) 50 MG tablet Take 1 tablet (50 mg total) by mouth 2 (two) times daily. 08/11/18 10/10/18  [name], MD  omeprazole (PRILOSEC) 20 MG capsule Take 20 mg by mouth daily.    [provider]  ranitidine (ZANTAC) 150 MG tablet Take 1 tablet by mouth 2 (two) times daily. 05/06/18   [provider]  sitaGLIPtin (JANUVIA) 100 MG tablet Take 100 mg by mouth daily.    [provider]    Allergies Accupril [quinapril hcl]  Family History  Problem Relation Age of Onset  . Diabetes Neg Hx   . Hypertension Neg Hx     Social History Social History   Tobacco Use  . Smoking status:  Current Every Day Smoker    Packs/day: 1.00    Years: 40.00    Pack years: 40.00    Types: Cigarettes  . Smokeless tobacco: Never Used  Substance Use Topics  . Alcohol use: No  . Drug use: No    Review of Systems  Constitutional: No fever/chills Eyes: No visual changes. ENT: No sore throat. Cardiovascular: Denies chest pain. Respiratory: Positive for cough and shortness of breath. Gastrointestinal: No abdominal pain.  No nausea, no vomiting.  No diarrhea.  No constipation. Genitourinary: Negative for dysuria. Musculoskeletal: Negative for back pain. Skin: Negative for  rash. Neurological: Negative for headaches, focal weakness or numbness.   ____________________________________________   PHYSICAL EXAM:  VITAL SIGNS: ED Triage Vitals  Enc Vitals Group     BP 01/02/19 0410 (!) 189/92     Pulse Rate 01/02/19 0410 (!) 118     Resp 01/02/19 0410 18     Temp 01/02/19 0410 97.8 F (36.6 C)     Temp Source 01/02/19 0410 Oral     SpO2 01/02/19 0409 (!) 79 %     Weight 01/02/19 0413 163 lb (73.9 kg)     Height 01/02/19 0413 5\' 6"  (1.676 m)     Head Circumference --      Peak Flow --      Pain Score 01/02/19 0413 0     Pain Loc --      Pain Edu? --      Excl. in [place]? --     Constitutional: Alert and oriented.  Ill appearing and in moderate acute distress. Eyes: Conjunctivae are normal. PERRL. EOMI. Head: Atraumatic. Nose: No congestion/rhinnorhea. Mouth/Throat: Mucous membranes are moist.  Oropharynx non-erythematous. Neck: No stridor.   Cardiovascular: Tachycardic rate, regular rhythm. Grossly normal heart sounds.  Good peripheral circulation. Respiratory: Increased respiratory effort.  No retractions. Lungs diminished bilaterally. Gastrointestinal: Soft and nontender. No distention. No abdominal bruits. No CVA tenderness. Musculoskeletal: No lower extremity tenderness nor edema.  No joint effusions. Neurologic:  Normal speech and language. No gross focal neurologic deficits are appreciated.  Skin:  Skin is warm, dry and intact. No rash noted. Psychiatric: Mood and affect are normal. Speech and behavior are normal.  ____________________________________________   LABS (all labs ordered are listed, but only abnormal results are displayed)  Labs Reviewed  COMPREHENSIVE METABOLIC PANEL - Abnormal; Notable for the following components:      Result Value   Glucose, Bld 271 (*)    GFR calc non Af Amer 56 (*)    All other components within normal limits  CBC WITH DIFFERENTIAL/PLATELET - Abnormal; Notable for the following components:   WBC 19.5  (*)    Neutro Abs 12.4 (*)    Lymphs Abs 5.3 (*)    Monocytes Absolute 1.2 (*)    Abs Immature Granulocytes 0.15 (*)    All other components within normal limits  TROPONIN I - Abnormal; Notable for the following components:   Troponin I 0.04 (*)    All other components within normal limits  BRAIN NATRIURETIC PEPTIDE - Abnormal; Notable for the following components:   B Natriuretic Peptide 258.0 (*)    All other components within normal limits  BLOOD GAS, ARTERIAL - Abnormal; Notable for the following components:   pO2, Arterial 58 (*)    Bicarbonate 19.9 (*)    Acid-base deficit 5.1 (*)    All other components within normal limits  CG4 I-STAT (LACTIC ACID) - Abnormal; Notable for the following components:  Lactic Acid, Venous 4.57 (*)    All other components within normal limits  CULTURE, BLOOD (ROUTINE X 2)  CULTURE, BLOOD (ROUTINE X 2)  URINE CULTURE  URINALYSIS, ROUTINE W REFLEX MICROSCOPIC  INFLUENZA PANEL BY PCR (TYPE A & B)  PROCALCITONIN  I-STAT CG4 LACTIC ACID, ED  I-STAT CG4 LACTIC ACID, ED   ____________________________________________  EKG  ED ECG REPORT I, Dakiya Puopolo J, the attending physician, personally viewed and interpreted this ECG.   Date: 01/02/2019  EKG Time: 0416  Rate: 114  Rhythm: sinus tachycardia  Axis: Normal  Intervals:none  ST&T Change: Nonspecific  ____________________________________________  RADIOLOGY  ED MD interpretation: Possible pneumonia; mild interstitial edema  Repeat chest x-ray significantly worse, likely reflecting edema  Official radiology report(s): Dg Chest Port 1 View  Result Date: 01/02/2019 CLINICAL DATA:  Initial evaluation for acute worsening shortness of breath. EXAM: PORTABLE CHEST 1 VIEW COMPARISON:  Prior radiograph from a earlier the same day. FINDINGS: Cardiac and mediastinal silhouettes are grossly stable in size and contour. Aortic atherosclerosis. Lungs normally inflated. Market interval worsening in  extensive fairly symmetric bilateral airspace opacities, likely reflecting edema given the rapid nature of change. No appreciable pleural effusion. No pneumothorax. Superimposed infiltrates would be difficult to exclude. Osseous structures demonstrate no acute finding. IMPRESSION: Marked interval worsening in extensive fairly symmetric bilateral airspace opacities, likely reflecting edema given the rapid nature of change. Electronically Signed   By: Jeannine Boga M.D.   On: 01/02/2019 06:00   Dg Chest Port 1 View  Result Date: 01/02/2019 CLINICAL DATA:  Shortness of breath. EXAM: PORTABLE CHEST 1 VIEW COMPARISON:  08/24/2018 FINDINGS: Hazy and fine interstitial opacity mildly asymmetric to the left. Mild cardiomegaly. Stable mediastinal contours. No effusion or pneumothorax. IMPRESSION: Interstitial and airspace opacity that is likely edema based on prior radiographs. Electronically Signed   By: Monte Fantasia M.D.   On: 01/02/2019 04:39    ____________________________________________   PROCEDURES  Procedure(s) performed:     Procedure Name: Intubation Date/Time: 01/02/2019 6:43 AM Performed by: Paulette Blanch, MD Pre-anesthesia Checklist: Patient identified, Patient being monitored, Emergency Drugs available, Timeout performed and Suction available Oxygen Delivery Method: Ambu bag Preoxygenation: Pre-oxygenation with 100% oxygen Induction Type: Rapid sequence and Cricoid Pressure applied Ventilation: Mask ventilation without difficulty Laryngoscope Size: Glidescope and 3 Grade View: Grade II Tube size: 7.5 mm Number of attempts: 1 Airway Equipment and Method: Rigid stylet Placement Confirmation: ETT inserted through vocal cords under direct vision,  CO2 detector,  Breath sounds checked- equal and bilateral and Positive ETCO2       Critical Care performed: Yes, see critical care note(s)  CRITICAL CARE Performed by: Paulette Blanch   Total critical care time: 60  minutes  Critical care time was exclusive of separately billable procedures and treating other patients.  Critical care was necessary to treat or prevent imminent or life-threatening deterioration.  Critical care was time spent personally by me on the following activities: development of treatment plan with patient and/or surrogate as well as nursing, discussions with consultants, evaluation of patient's response to treatment, examination of patient, obtaining history from patient or surrogate, ordering and performing treatments and interventions, ordering and review of laboratory studies, ordering and review of radiographic studies, pulse oximetry and re-evaluation of patient's condition. ____________________________________________   INITIAL IMPRESSION / ASSESSMENT AND PLAN / ED COURSE  As part of my medical decision making, I reviewed the following data within the [place] History obtained from family, Nursing notes reviewed  and incorporated, Labs reviewed, EKG interpreted, Old chart reviewed, Radiograph reviewed, Discussed with admitting physician and Notes from prior ED visits   78 year old female with newly diagnosed COPD and recurrent pneumonia who presents with respiratory distress and hypoxia. Differential includes, but is not limited to, viral syndrome, bronchitis including COPD exacerbation, pneumonia, reactive airway disease including asthma, CHF including exacerbation with or without pulmonary/interstitial edema, pneumothorax, ACS, thoracic trauma, and pulmonary embolism.  Will obtain screening lab work, chest x-ray.  ED sepsis called.   Clinical Course as of Jan 02 641  [place] Jan 02, 2019  0527 Patient now with audible rales and increased tachypnea on 5 L nasal cannula oxygen.  Will place on BiPAP, stopped IV fluids, administer IV Lasix.  Discussed with hospitalist Dr. Aliene Altes for evaluation in emergency department for admission.   [JS]  [id number] I personally  viewed patient's repeat x-ray which now shows worsening pulmonary edema.  Patient is on BiPAP, tachypneic, tachycardic with saturations in the upper 80s.  I had a lengthy discussion with the patient and her daughter and will proceed with intubation.   [JS]  [id number] Patient intubated, please see procedure notes.  Overnight CCU for secondary to staffing issues; ICUs at nearby tertiary care centers full as well.  Will call around again to see if there is an open and available ICU bed for this patient.  If there is a nearby open available ICU bed, will plan to transfer the patient.  If not, will admit the patient to our facility.   [JS]  0620 Janeece Riggers verified that [facility] and [facility] ICUs are full.  [facility] may have a single MICU bed left.  Will speak with transfer center.   [JS]  [id number] Spoke with transfer center.  Patient would only be accepted to the [facility] waitlist as there is not a guaranteed open MICU bed available.  Have discussed with hospitalist who will evaluate patient in emergency department and admit her to our CCU here.   [JS]    Clinical Course User Index [JS] Paulette Blanch, MD     ____________________________________________   FINAL CLINICAL IMPRESSION(S) / ED DIAGNOSES  Final diagnoses:  Respiratory distress  Hypoxia  Community acquired pneumonia, unspecified laterality  Acute pulmonary edema ([place])  Acute respiratory failure with hypoxia [facility])     ED Discharge Orders    None       Note:  This document was prepared using Dragon voice recognition software and may include unintentional dictation errors.    Paulette Blanch, MD 01/02/19 [phone]

## 2019-01-02 NOTE — ED Triage Notes (Signed)
Pt arrived from home via EMS with complaints of respiratory distress. Home health nurse said pt was SOB. EMS stated pt was 79%RA when they arrived. Pt is not on home oxygen. Pt has Hx of repeated pneumonia. Pt is alert and oriented x 4. VS per EMS BP-210/110 HR-120-130 O2sat-98% EMS stated that 12-Lead showed regular artifact and occasional  PVC. EMS placed a 20 in left arm.

## 2019-01-03 DIAGNOSIS — J9621 Acute and chronic respiratory failure with hypoxia: Secondary | ICD-10-CM

## 2019-01-03 DIAGNOSIS — I5043 Acute on chronic combined systolic (congestive) and diastolic (congestive) heart failure: Secondary | ICD-10-CM

## 2019-01-03 LAB — BASIC METABOLIC PANEL
Anion gap: 13 (ref 5–15)
BUN: 33 mg/dL — ABNORMAL HIGH (ref 8–23)
CO2: 18 mmol/L — ABNORMAL LOW (ref 22–32)
Calcium: 8.3 mg/dL — ABNORMAL LOW (ref 8.9–10.3)
Chloride: 109 mmol/L (ref 98–111)
Creatinine, Ser: 2.12 mg/dL — ABNORMAL HIGH (ref 0.44–1.00)
GFR calc Af Amer: 25 mL/min — ABNORMAL LOW (ref >60)
GFR calc non Af Amer: 22 mL/min — ABNORMAL LOW (ref >60)
Glucose, Bld: 178 mg/dL — ABNORMAL HIGH (ref 70–99)
Potassium: 3.3 mmol/L — ABNORMAL LOW (ref 3.5–5.1)
Sodium: 140 mmol/L (ref 135–145)

## 2019-01-03 LAB — GLUCOSE, CAPILLARY
Glucose-Capillary: 117 mg/dL — ABNORMAL HIGH (ref 70–99)
Glucose-Capillary: 142 mg/dL — ABNORMAL HIGH (ref 70–99)
Glucose-Capillary: 147 mg/dL — ABNORMAL HIGH (ref 70–99)
Glucose-Capillary: 147 mg/dL — ABNORMAL HIGH (ref 70–99)
Glucose-Capillary: 155 mg/dL — ABNORMAL HIGH (ref 70–99)
Glucose-Capillary: 158 mg/dL — ABNORMAL HIGH (ref 70–99)
Glucose-Capillary: 158 mg/dL — ABNORMAL HIGH (ref 70–99)
Glucose-Capillary: 161 mg/dL — ABNORMAL HIGH (ref 70–99)
Glucose-Capillary: 176 mg/dL — ABNORMAL HIGH (ref 70–99)
Glucose-Capillary: 188 mg/dL — ABNORMAL HIGH (ref 70–99)
Glucose-Capillary: 210 mg/dL — ABNORMAL HIGH (ref 70–99)
Glucose-Capillary: 219 mg/dL — ABNORMAL HIGH (ref 70–99)
Glucose-Capillary: 237 mg/dL — ABNORMAL HIGH (ref 70–99)
Glucose-Capillary: 246 mg/dL — ABNORMAL HIGH (ref 70–99)
Glucose-Capillary: 248 mg/dL — ABNORMAL HIGH (ref 70–99)

## 2019-01-03 LAB — CBC
HCT: 33.1 % — ABNORMAL LOW (ref 36.0–46.0)
Hemoglobin: 10.7 g/dL — ABNORMAL LOW (ref 12.0–15.0)
MCH: 26.2 pg (ref 26.0–34.0)
MCHC: 32.3 g/dL (ref 30.0–36.0)
MCV: 81.1 fL (ref 80.0–100.0)
Platelets: 433 10*3/uL — ABNORMAL HIGH (ref 150–400)
RBC: 4.08 MIL/uL (ref 3.87–5.11)
RDW: 14.9 % (ref 11.5–15.5)
WBC: 17.5 10*3/uL — ABNORMAL HIGH (ref 4.0–10.5)
nRBC: 0 % (ref 0.0–0.2)

## 2019-01-03 LAB — MAGNESIUM: Magnesium: 1.6 mg/dL — ABNORMAL LOW (ref 1.7–2.4)

## 2019-01-03 LAB — URINE CULTURE: Culture: NO GROWTH

## 2019-01-03 LAB — HIV ANTIBODY (ROUTINE TESTING W REFLEX): HIV Screen 4th Generation wRfx: NONREACTIVE

## 2019-01-03 MED ORDER — MAGNESIUM SULFATE 2 GM/50ML IV SOLN
2.0000 g | Freq: Once | INTRAVENOUS | Status: AC
  Administered 2019-01-03: 2 g via INTRAVENOUS
  Filled 2019-01-03: qty 50

## 2019-01-03 MED ORDER — INSULIN ASPART 100 UNIT/ML ~~LOC~~ SOLN
0.0000 [IU] | SUBCUTANEOUS | Status: DC
  Administered 2019-01-03: 3 [IU] via SUBCUTANEOUS
  Administered 2019-01-03 – 2019-01-04 (×4): 5 [IU] via SUBCUTANEOUS
  Administered 2019-01-04: 11 [IU] via SUBCUTANEOUS
  Administered 2019-01-04: 8 [IU] via SUBCUTANEOUS
  Filled 2019-01-03 (×6): qty 1

## 2019-01-03 MED ORDER — METHYLPREDNISOLONE SODIUM SUCC 40 MG IJ SOLR
40.0000 mg | Freq: Two times a day (BID) | INTRAMUSCULAR | Status: DC
  Administered 2019-01-03 – 2019-01-04 (×2): 40 mg via INTRAVENOUS
  Filled 2019-01-03 (×2): qty 1

## 2019-01-03 MED ORDER — ENOXAPARIN SODIUM 30 MG/0.3ML ~~LOC~~ SOLN
30.0000 mg | SUBCUTANEOUS | Status: DC
  Administered 2019-01-04: 30 mg via SUBCUTANEOUS
  Filled 2019-01-03: qty 0.3

## 2019-01-03 MED ORDER — METOPROLOL TARTRATE 5 MG/5ML IV SOLN
5.0000 mg | Freq: Once | INTRAVENOUS | Status: AC
  Administered 2019-01-03: 5 mg via INTRAVENOUS
  Filled 2019-01-03: qty 5

## 2019-01-03 MED ORDER — POTASSIUM CHLORIDE 20 MEQ PO PACK
40.0000 meq | PACK | Freq: Once | ORAL | Status: AC
  Administered 2019-01-03: 40 meq
  Filled 2019-01-03: qty 2

## 2019-01-03 MED ORDER — IPRATROPIUM-ALBUTEROL 0.5-2.5 (3) MG/3ML IN SOLN
3.0000 mL | Freq: Four times a day (QID) | RESPIRATORY_TRACT | Status: DC
  Administered 2019-01-04 – 2019-01-06 (×9): 3 mL via RESPIRATORY_TRACT
  Filled 2019-01-03 (×10): qty 3

## 2019-01-03 MED ORDER — METOPROLOL TARTRATE 50 MG PO TABS
50.0000 mg | ORAL_TABLET | Freq: Two times a day (BID) | ORAL | Status: DC
  Administered 2019-01-03: 50 mg
  Filled 2019-01-03: qty 1

## 2019-01-03 MED ORDER — PANTOPRAZOLE SODIUM 40 MG PO PACK
40.0000 mg | PACK | Freq: Every day | ORAL | Status: DC
  Administered 2019-01-03: 40 mg

## 2019-01-03 NOTE — Progress Notes (Signed)
Pt was suctioned prior to extubation for no secretions. Per Dr. Domingo Dimes order, she was extubated. No stridor was heard after extubation. The pt was placed on a 2L nasal cannula.

## 2019-01-03 NOTE — Consult Note (Signed)
PHARMACY CONSULT NOTE - FOLLOW UP  Pharmacy Consult for Electrolyte Monitoring and Replacement   Recent Labs: Potassium (mmol/L)  Date Value  01/03/2019 3.3 (L)   Magnesium (mg/dL)  Date Value  01/03/2019 1.6 (L)   Calcium (mg/dL)  Date Value  01/03/2019 8.3 (L)   Albumin (g/dL)  Date Value  01/02/2019 3.8   Phosphorus (mg/dL)  Date Value  08/24/2018 3.8   Sodium (mmol/L)  Date Value  01/03/2019 140    Assessment: Pharmacy consulted for electrolyte monitoring and replacement in 78 yo female admitted with respiratory distress.   Goal of Therapy:  Electrolytes WNL  Plan:  1/8. Scr has increased from 0.97 to 2.12. Will order KCL 68mEq x 1 dose and Magnesium 2g IV x 1 dose.   Will F/U with AM labs and continue to replace electrolytes as needed.   Pernell Dupre, PharmD, BCPS Clinical Pharmacist 01/03/2019 12:47 PM

## 2019-01-03 NOTE — Progress Notes (Signed)
Patient Name: Brittney [name] Date of Encounter: 01/03/2019  Hospital Problem List     Active Problems:   Acute hypoxemic respiratory failure [facility])    Patient Profile     78 year old female with history of mild cardiomyopathy admitted with respiratory failure requiring intubation.  Currently on phenylephrine drip for hemodynamic support.  Echo showed mild reduced LV function EF 45 to 50%.  Chest x-ray yesterday suggested pulmonary edema  Subjective   Intubated and sedated  Inpatient Medications    . aspirin  81 mg Oral Daily  . atorvastatin  40 mg Oral Daily  . chlorhexidine gluconate (MEDLINE KIT)  15 mL Mouth Rinse BID  . enoxaparin (LOVENOX) injection  40 mg Subcutaneous Q24H  . furosemide  40 mg Intravenous BID  . ipratropium-albuterol  3 mL Nebulization Q4H  . mouth rinse  15 mL Mouth Rinse 10 times per day  . methylPREDNISolone (SOLU-MEDROL) injection  60 mg Intravenous Q12H   Followed by  . [START ON 01/04/2019] predniSONE  40 mg Oral Q breakfast  .  morphine injection  2 mg Intravenous Once  . ondansetron (ZOFRAN) IV  4 mg Intravenous Once  . pantoprazole  40 mg Oral Daily    Vital Signs    Vitals:   01/03/19 0100 01/03/19 0200 01/03/19 0300 01/03/19 0500  BP: (!) 93/55 101/65 (!) 86/54   Pulse: (!) 125 (!) 125 (!) 120   Resp: (!) 28 (!) 26 (!) 26   Temp:      TempSrc:      SpO2: 98% 99% 99%   Weight:    82.7 kg  Height:        Intake/Output Summary (Last 24 hours) at 01/03/2019 0808 Last data filed at 01/03/2019 0708 Gross per 24 hour  Intake 1443.46 ml  Output 1200 ml  Net 243.46 ml   Filed Weights   01/02/19 0413 01/02/19 0915 01/03/19 0500  Weight: 73.9 kg 79 kg 82.7 kg    Physical Exam    GEN: Well nourished, well developed, in no acute distress.  HEENT: normal.  Neck: Supple, no JVD, carotid bruits, or masses. Cardiac: RRR, no murmurs, rubs, or gallops. No clubbing, cyanosis, edema.  Radials/DP/PT 2+ and equal bilaterally.  Respiratory:   Respirations regular and unlabored, clear to auscultation bilaterally. GI: Soft, nontender, nondistended, BS + x 4. MS: no deformity or atrophy. Skin: warm and dry, no rash. Neuro:  Strength and sensation are intact. Psych: Normal affect.  Labs    CBC Recent Labs    01/02/19 0432  WBC 19.5*  NEUTROABS 12.4*  HGB 12.7  HCT 39.9  MCV 83.3  PLT 476   Basic Metabolic Panel Recent Labs    01/02/19 0432  NA 139  K 3.9  CL 105  CO2 22  GLUCOSE 271*  BUN 17  CREATININE 0.97  CALCIUM 9.0   Liver Function Tests Recent Labs    01/02/19 0432  AST 19  ALT 14  ALKPHOS 55  BILITOT 0.8  PROT 7.3  ALBUMIN 3.8   No results for input(s): LIPASE, AMYLASE in the last 72 hours. Cardiac Enzymes Recent Labs    01/02/19 0432 01/02/19 0725 01/02/19 1044  TROPONINI 0.04* 0.14* 0.28*   BNP Recent Labs    01/02/19 0432  BNP 258.0*   D-Dimer No results for input(s): DDIMER in the last 72 hours. Hemoglobin A1C No results for input(s): HGBA1C in the last 72 hours. Fasting Lipid Panel No results for input(s): CHOL, HDL, LDLCALC,  TRIG, CHOLHDL, LDLDIRECT in the last 72 hours. Thyroid Function Tests No results for input(s): TSH, T4TOTAL, T3FREE, THYROIDAB in the last 72 hours.  Invalid input(s): FREET3  Telemetry    Sinus tachycardia at a rate of 121  ECG    Sinus tachycardia with no obvious ischemic changes  Radiology    Dg Abd 1 View  Result Date: 01/02/2019 CLINICAL DATA:  Status post nasogastric tube placement. EXAM: ABDOMEN - 1 VIEW COMPARISON:  Radiograph of August 07, 2018. FINDINGS: The bowel gas pattern is normal. Distal tip of nasogastric tube is seen in expected position of proximal stomach. No radio-opaque calculi or other significant radiographic abnormality are seen. IMPRESSION: Distal tip of nasogastric tube seen in expected position of proximal stomach. No evidence of bowel obstruction or ileus. Electronically Signed   By: Marijo Conception, M.D.   On:  01/02/2019 10:31   Ct Angio Chest Pe W Or Wo Contrast  Result Date: 01/02/2019 CLINICAL DATA:  Respiratory distress, intubated EXAM: CT ANGIOGRAPHY CHEST WITH CONTRAST TECHNIQUE: Multidetector CT imaging of the chest was performed using the standard protocol during bolus administration of intravenous contrast. Multiplanar CT image reconstructions and MIPs were obtained to evaluate the vascular anatomy. CONTRAST:  7m OMNIPAQUE IOHEXOL 350 MG/ML SOLN COMPARISON:  Chest x-ray from earlier in the same day. FINDINGS: Cardiovascular: Thoracic aorta and its branches demonstrate atherosclerotic calcifications. No aneurysmal dilatation or dissection is seen. No cardiac enlargement is noted. Coronary calcifications are identified. Pulmonary artery shows a normal branching pattern without intraluminal filling defect to suggest pulmonary embolism. Evaluation of the peripheral most branches in the lower lobes is limited due to significant consolidation related to the underlying edema. Mediastinum/Nodes: Thoracic inlet is within normal limits. No significant hilar or mediastinal adenopathy is noted. The esophagus is within normal limits. Nasogastric catheter extends into the stomach. Endotracheal tube is noted in satisfactory position above the carina Lungs/Pleura: There is diffuse alveolar density identified with consolidation primarily within the lower lobes. This is consistent with pulmonary edema given the abrupt onset from earlier in the same day. Diffuse interstitial edema is noted as well. Small effusions are noted bilaterally. No focal parenchymal nodules or mass lesions are seen although a significant amount of the lower lobes is obscured due to the consolidation. Upper Abdomen: Visualized upper abdomen shows no acute abnormality. Musculoskeletal: No acute bony abnormality is noted. Mild degenerative changes of the thoracic spine are seen. Review of the MIP images confirms the above findings. IMPRESSION: Changes  consistent with CHF with significant acute alveolar and interstitial edema identified. Consolidation is noted within the lower lobes bilaterally related to significant edematous change. No evidence of pulmonary emboli. Small effusions bilaterally. Aortic Atherosclerosis (ICD10-I70.0). Electronically Signed   By: MInez CatalinaM.D.   On: 01/02/2019 09:13   Dg Chest Port 1 View  Result Date: 01/02/2019 CLINICAL DATA:  Status post intubation. EXAM: PORTABLE CHEST 1 VIEW COMPARISON:  Radiograph of same day. FINDINGS: Stable cardiomediastinal silhouette. Atherosclerosis of thoracic aorta is noted. Endotracheal tube is seen projected over tracheal air shadow with distal tip 4 cm above the carina. Nasogastric tube is seen entering the stomach. Bilateral pulmonary edema is noted. Minimal pleural effusions are noted. No pneumothorax is noted. Bony thorax is unremarkable. IMPRESSION: Endotracheal tube in grossly good position. Stable probable bilateral pulmonary edema. Aortic Atherosclerosis (ICD10-I70.0). Electronically Signed   By: JMarijo Conception M.D.   On: 01/02/2019 10:30   Dg Chest Port 1 View  Result Date: 01/02/2019 CLINICAL DATA:  Initial evaluation for acute worsening shortness of breath. EXAM: PORTABLE CHEST 1 VIEW COMPARISON:  Prior radiograph from a earlier the same day. FINDINGS: Cardiac and mediastinal silhouettes are grossly stable in size and contour. Aortic atherosclerosis. Lungs normally inflated. Market interval worsening in extensive fairly symmetric bilateral airspace opacities, likely reflecting edema given the rapid nature of change. No appreciable pleural effusion. No pneumothorax. Superimposed infiltrates would be difficult to exclude. Osseous structures demonstrate no acute finding. IMPRESSION: Marked interval worsening in extensive fairly symmetric bilateral airspace opacities, likely reflecting edema given the rapid nature of change. Electronically Signed   By: Jeannine Boga M.D.    On: 01/02/2019 06:00   Dg Chest Port 1 View  Result Date: 01/02/2019 CLINICAL DATA:  Shortness of breath. EXAM: PORTABLE CHEST 1 VIEW COMPARISON:  08/24/2018 FINDINGS: Hazy and fine interstitial opacity mildly asymmetric to the left. Mild cardiomegaly. Stable mediastinal contours. No effusion or pneumothorax. IMPRESSION: Interstitial and airspace opacity that is likely edema based on prior radiographs. Electronically Signed   By: Monte Fantasia M.D.   On: 01/02/2019 04:39    Signed, Chrissie Noa A. 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  Currently on Neo-Synephrine for pressure support.  Intubated and sedated.  CHF-we will repeat chest x-ray today to see if there is any improvement.  She is -574 cc since admission.  We will continue to carefully diurese following hemodynamics, clinical and x-ray improvement as well and has renal function.  Respiratory failure-likely multifactorial to include respiratory failure as well as congestive heart failure.  Continue with ventilatory support weaning as tolerated.  As per above we will continue to carefully diurese.  Renal function pending.  Hypotension-continue with Neo-Synephrine weaning as tolerated.  Attempt to keep map greater than 65.

## 2019-01-03 NOTE — Progress Notes (Signed)
PHARMACIST - PHYSICIAN COMMUNICATION  CONCERNING:  Enoxaparin (Lovenox) for DVT Prophylaxis    RECOMMENDATION: Patient was prescribed enoxaprin 40mg  q24 hours for VTE prophylaxis.   Filed Weights   01/02/19 0413 01/02/19 0915 01/03/19 0500  Weight: 163 lb (73.9 kg) 174 lb 2.6 oz (79 kg) 182 lb 5.1 oz (82.7 kg)    Body mass index is 29.43 kg/m.  Estimated Creatinine Clearance: 23.7 mL/min (A) (by C-G formula based on SCr of 2.12 mg/dL (H)).  Patient is candidate for enoxaparin 30mg  every 24 hours based on CrCl <33ml/min   DESCRIPTION: Pharmacy has adjusted enoxaparin dose.  Patient is now receiving enoxaparin 30mg  every 24 hours.  Pernell Dupre, PharmD, BCPS Clinical Pharmacist 01/03/2019 9:28 AM

## 2019-01-03 NOTE — Progress Notes (Signed)
Follow up - Critical Care Medicine Note  Patient Details:    Brittney [name] is an 78 y.o. female. 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  The patient also had decompensation of chronic heart failure and required diuretics.  Lines, Airways, Drains: Urethral Catheter Beth Tech Straight-tip 16 Fr. (Active)  Indication for Insertion or Continuance of Catheter Unstable critical patients (first 24-48 hours) 01/03/2019  7:25 PM  Site Assessment Clean;Intact 01/03/2019  7:25 PM  Catheter Maintenance Drainage bag/tubing not touching floor;Catheter secured;Bag below level of bladder;No dependent loops;Seal intact 01/03/2019  8:00 PM  Collection Container Standard drainage bag 01/03/2019  7:25 PM  Securement Method Securing device (Describe) 01/03/2019  7:25 PM  Urinary Catheter Interventions Unclamped 01/02/2019  8:00 PM  Output (mL) 125 mL 01/03/2019  6:00 PM     External Urinary Catheter (Active)    Anti-infectives:  Anti-infectives (From admission, onward)   Start     Dose/Rate Route Frequency Ordered Stop   01/03/19 1000  azithromycin (ZITHROMAX) 500 mg in sodium chloride 0.9 % 250 mL IVPB  Status:  Discontinued     500 mg 250 mL/hr over 60 Minutes Intravenous Every 24 hours 01/02/19 0707 01/02/19 1344   01/03/19 1000  cefTRIAXone (ROCEPHIN) 1 g in sodium chloride 0.9 % 100 mL IVPB  Status:  Discontinued     1 g 200 mL/hr over 30 Minutes Intravenous Every 24 hours 01/02/19 1115 01/02/19 1344   01/02/19 1100  vancomycin (VANCOCIN) IVPB 1000 mg/200 mL premix  Status:  Discontinued     1,000 mg 200 mL/hr over 60 Minutes Intravenous  Once 01/02/19 1010 01/02/19 1115   01/02/19 0800  piperacillin-tazobactam (ZOSYN) IVPB 3.375 g  Status:  Discontinued     3.375 g 12.5 mL/hr over 240 Minutes Intravenous Every 8 hours 01/02/19 0704 01/02/19 1115   01/02/19 0800  vancomycin (VANCOCIN) IVPB 1000 mg/200  mL premix  Status:  Discontinued     1,000 mg 200 mL/hr over 60 Minutes Intravenous  Once 01/02/19 0704 01/02/19 1010   01/02/19 0430  cefTRIAXone (ROCEPHIN) 2 g in sodium chloride 0.9 % 100 mL IVPB  Status:  Discontinued     2 g 200 mL/hr over 30 Minutes Intravenous Every 24 hours 01/02/19 0419 01/02/19 0703   01/02/19 0430  azithromycin (ZITHROMAX) 500 mg in sodium chloride 0.9 % 250 mL IVPB  Status:  Discontinued     500 mg 250 mL/hr over 60 Minutes Intravenous Every 24 hours 01/02/19 0419 01/02/19 0703      Microbiology: Results for orders placed or performed during the hospital encounter of 01/02/19  Blood Culture (routine x 2)     Status: None (Preliminary result)   Collection Time: 01/02/19  4:32 AM  Result Value Ref Range Status   Specimen Description BLOOD RIGHT AC  Final   Special Requests   Final    BOTTLES DRAWN AEROBIC AND ANAEROBIC Blood Culture results may not be optimal due to an excessive volume of blood received in culture bottles   Culture   Final    NO GROWTH < 24 HOURS Performed at [facility], [address] [place], [place] [address]    Report Status PENDING  Incomplete  Urine culture     Status: None   Collection Time: 01/02/19  6:07 AM  Result Value Ref Range Status   Specimen Description   Final    URINE, RANDOM Performed at [facility], [address] [place], [place] [address]    Special Requests   Final    NONE Performed at [facility], [address], [place], [place] [address]    Culture   Final    NO GROWTH Performed at [facility], [place] [address], [place], [place] [address]    Report Status 01/03/2019 FINAL  Final  MRSA PCR Screening     Status: None   Collection Time: 01/02/19  9:23 AM  Result Value Ref Range Status   MRSA by PCR NEGATIVE NEGATIVE Final    Comment:        The GeneXpert MRSA Assay (FDA approved for NASAL specimens only), is one component of a comprehensive MRSA  colonization surveillance program. It is not intended to diagnose MRSA infection nor to guide or monitor treatment for MRSA infections. Performed at [facility], [address] [place], [place] [address]   Blood Culture (routine x 2)     Status: None (Preliminary result)   Collection Time: 01/02/19 10:44 AM  Result Value Ref Range Status   Specimen Description BLOOD BLOOD RIGHT HAND  Final   Special Requests   Final    BOTTLES DRAWN AEROBIC ONLY Blood Culture adequate volume   Culture   Final    NO GROWTH < 24 HOURS Performed at [facility], [address] [place], [place] [address]    Report Status PENDING  Incomplete  Culture, respiratory     Status: None (Preliminary result)   Collection Time: 01/02/19  4:05 PM  Result Value Ref Range Status   Specimen Description   Final    SPUTUM Performed at [facility], [address], [place], [place] [address]    Special Requests   Final    NONE Performed at [facility], [address] [place], [place] [address]    Gram Stain   Final    MODERATE WBC PRESENT, PREDOMINANTLY MONONUCLEAR NO ORGANISMS SEEN    Culture   Final    NO GROWTH < 24 HOURS Performed at [facility], [place] [address] [place], [place] [address]    Report Status PENDING  Incomplete    Best Practice/Protocols:  VTE Prophylaxis: Lovenox (prophylaxtic dose) GI Prophylaxis: Proton Pump Inhibitor Weaning protocol  Events:   Studies: Dg Abd 1 View  Result Date: 01/02/2019 CLINICAL DATA:  Status post nasogastric tube placement. EXAM: ABDOMEN - 1 VIEW COMPARISON:  Radiograph of August 07, 2018. FINDINGS: The bowel gas pattern is normal. Distal tip of nasogastric tube is seen in expected position of proximal stomach. No radio-opaque calculi or other significant radiographic abnormality are seen. IMPRESSION: Distal tip of nasogastric tube seen in expected position of proximal stomach. No evidence of bowel  obstruction or ileus. Electronically Signed   By: Marijo Conception, M.D.   On: 01/02/2019 10:31   Ct Angio Chest Pe W Or Wo Contrast  Result Date: 01/02/2019 CLINICAL DATA:  Respiratory distress, intubated EXAM: CT ANGIOGRAPHY CHEST WITH CONTRAST TECHNIQUE: Multidetector CT imaging of the chest was performed using the standard protocol during bolus administration of intravenous contrast. Multiplanar CT image reconstructions and MIPs were obtained to evaluate the vascular anatomy. CONTRAST:  15mL OMNIPAQUE IOHEXOL 350 MG/ML SOLN COMPARISON:  Chest x-ray from earlier in the same day. FINDINGS: Cardiovascular: Thoracic aorta and its branches demonstrate atherosclerotic calcifications. No aneurysmal dilatation or dissection is seen. No cardiac enlargement is noted. Coronary calcifications are identified. Pulmonary artery shows a  normal branching pattern without intraluminal filling defect to suggest pulmonary embolism. Evaluation of the peripheral most branches in the lower lobes is limited due to significant consolidation related to the underlying edema. Mediastinum/Nodes: Thoracic inlet is within normal limits. No significant hilar or mediastinal adenopathy is noted. The esophagus is within normal limits. Nasogastric catheter extends into the stomach. Endotracheal tube is noted in satisfactory position above the carina Lungs/Pleura: There is diffuse alveolar density identified with consolidation primarily within the lower lobes. This is consistent with pulmonary edema given the abrupt onset from earlier in the same day. Diffuse interstitial edema is noted as well. Small effusions are noted bilaterally. No focal parenchymal nodules or mass lesions are seen although a significant amount of the lower lobes is obscured due to the consolidation. Upper Abdomen: Visualized upper abdomen shows no acute abnormality. Musculoskeletal: No acute bony abnormality is noted. Mild degenerative changes of the thoracic spine are seen.  Review of the MIP images confirms the above findings. IMPRESSION: Changes consistent with CHF with significant acute alveolar and interstitial edema identified. Consolidation is noted within the lower lobes bilaterally related to significant edematous change. No evidence of pulmonary emboli. Small effusions bilaterally. Aortic Atherosclerosis (ICD10-I70.0). Electronically Signed   By: Inez Catalina M.D.   On: 01/02/2019 09:13   Dg Chest Port 1 View  Result Date: 01/02/2019 CLINICAL DATA:  Status post intubation. EXAM: PORTABLE CHEST 1 VIEW COMPARISON:  Radiograph of same day. FINDINGS: Stable cardiomediastinal silhouette. Atherosclerosis of thoracic aorta is noted. Endotracheal tube is seen projected over tracheal air shadow with distal tip 4 cm above the carina. Nasogastric tube is seen entering the stomach. Bilateral pulmonary edema is noted. Minimal pleural effusions are noted. No pneumothorax is noted. Bony thorax is unremarkable. IMPRESSION: Endotracheal tube in grossly good position. Stable probable bilateral pulmonary edema. Aortic Atherosclerosis (ICD10-I70.0). Electronically Signed   By: Marijo Conception, M.D.   On: 01/02/2019 10:30   Dg Chest Port 1 View  Result Date: 01/02/2019 CLINICAL DATA:  Initial evaluation for acute worsening shortness of breath. EXAM: PORTABLE CHEST 1 VIEW COMPARISON:  Prior radiograph from a earlier the same day. FINDINGS: Cardiac and mediastinal silhouettes are grossly stable in size and contour. Aortic atherosclerosis. Lungs normally inflated. Market interval worsening in extensive fairly symmetric bilateral airspace opacities, likely reflecting edema given the rapid nature of change. No appreciable pleural effusion. No pneumothorax. Superimposed infiltrates would be difficult to exclude. Osseous structures demonstrate no acute finding. IMPRESSION: Marked interval worsening in extensive fairly symmetric bilateral airspace opacities, likely reflecting edema given the rapid  nature of change. Electronically Signed   By: Jeannine Boga M.D.   On: 01/02/2019 06:00   Dg Chest Port 1 View  Result Date: 01/02/2019 CLINICAL DATA:  Shortness of breath. EXAM: PORTABLE CHEST 1 VIEW COMPARISON:  08/24/2018 FINDINGS: Hazy and fine interstitial opacity mildly asymmetric to the left. Mild cardiomegaly. Stable mediastinal contours. No effusion or pneumothorax. IMPRESSION: Interstitial and airspace opacity that is likely edema based on prior radiographs. Electronically Signed   By: Monte Fantasia M.D.   On: 01/02/2019 04:39    Consults: Treatment Team:  Arta Silence, MD Teodoro Spray, MD   Subjective:    Overnight Issues: Overnight no issues regarding respiratory failure.  She has not had issues with significant tachycardia in the 140s, metoprolol was held so this was reinstituted.  She is however tolerating spontaneous breathing trial this morning.  Objective:  Vital signs for last 24 hours: Temp:  [98.3 F (36.8  C)-99.9 F (37.7 C)] 98.6 F (37 C) (01/08 1900) Pulse Rate:  [87-135] 87 (01/08 2100) Resp:  [6-32] 25 (01/08 2100) BP: (77-195)/(53-102) 145/61 (01/08 2100) SpO2:  [89 %-100 %] 98 % (01/08 2100) FiO2 (%):  [35 %] 35 % (01/08 1244) Weight:  [82.7 kg] 82.7 kg (01/08 0500)  Hemodynamic parameters for last 24 hours:    Intake/Output from previous day: 01/07 0701 - 01/08 0700 In: 1430.3 [I.V.:973.7; NG/GT:60; IV Piggyback:396.7] Out: 1200 [Urine:950; Emesis/NG output:250]  Intake/Output this shift: No intake/output data recorded.  Vent settings for last 24 hours: Vent Mode: PSV FiO2 (%):  [35 %] 35 % Set Rate:  [24 bmp] 24 bmp Vt Set:  [480 mL] 480 mL PEEP:  [5 cmH20] 5 cmH20 Pressure Support:  [5 cmH20-10 cmH20] 5 cmH20 Plateau Pressure:  [12 cmH20-17 cmH20] 17 cmH20  Physical Exam:  Vital signs as noted. GENERAL: Elderly woman, orotracheally intubated, in no respiratory distress synchronous with the ventilator HEAD:  Normocephalic, atraumatic.  EYES: Pupils equal, round, reactive to light.  No scleral icterus.  MOUTH: Moist mucosal membranes. NECK: Supple. No JVD.  Trachea midline, no crepitus. PULMONARY: Coarse breath sounds, few rhonchi no wheezes. CARDIOVASCULAR: Tachycardic rate in the 140s, appears regular.  No rubs murmurs or gallops.Marland Kitchen  GASTROINTESTINAL: Soft,nondistended. No masses. Positive bowel sounds. No hepatosplenomegaly.  No tenderness. MUSCULOSKELETAL: No swelling, clubbing, or edema.  NEUROLOGIC: Awake, follows commands.  Appears anxious occasionally but easily calmed and redirected. SKIN:intact,warm,dry  Assessment/Plan:  1.  Acute on chronic mixed respiratory failure (hypoxic and hypercarbic) requiring mechanical ventilatory support.  Etiology of the respiratory failure is related to congestive heart failure exacerbation aggravated by COPD exacerbation.  She presented with metabolic acidosis that has corrected.  This is likely related to hypoxia.  She is tolerating spontaneous breathing trials and her metabolic parameters have corrected.  We will proceed with weaning and extubating today.  2.  Acute exacerbation of systolic and diastolic heart failure.  Left ventricular ejection fraction 45% with grade 1 diastolic dysfunction.  Patient responded to Lasix.  Received Lasix dose this morning again.  Monitor closely.  3.  Exacerbation of COPD: Continue nebulization treatments, decrease steroids as no longer bronchospastic.  Likely trigger CHF decompensation, she does not have a pneumonic infiltrate or other evidence of infection.  Alternatively her wheezing could have been related to "cardiac asthma".  Currently no indication for antibiotics.  4.  Tachycardia: Resume beta-blockers.  5.  Hyperglycemia: Transition to sliding scale insulin.  Decrease IV steroids.      LOS: 1 day   Additional comments:Multidisciplinary rounds were performed with the ICU team.  Patient's family is at bedside  and they were apprised.  Critical Care Total Time*: 40 Minutes  C. Derrill Kay, MD [facility]  01/03/2019  *Care during the described time interval was provided by me and/or other providers on the critical care team.  I have reviewed this patient's available data, including medical history, events of note, physical examination and test results as part of my evaluation.

## 2019-01-03 NOTE — Progress Notes (Signed)
Pt has a score of 7 on the  RT protocol assessment. Because of this her nebulizer treatments are changed to Q6.

## 2019-01-04 LAB — CBC
HCT: 29.1 % — ABNORMAL LOW (ref 36.0–46.0)
Hemoglobin: 9.4 g/dL — ABNORMAL LOW (ref 12.0–15.0)
MCH: 26.3 pg (ref 26.0–34.0)
MCHC: 32.3 g/dL (ref 30.0–36.0)
MCV: 81.5 fL (ref 80.0–100.0)
Platelets: 316 10*3/uL (ref 150–400)
RBC: 3.57 MIL/uL — ABNORMAL LOW (ref 3.87–5.11)
RDW: 15 % (ref 11.5–15.5)
WBC: 21.5 10*3/uL — ABNORMAL HIGH (ref 4.0–10.5)
nRBC: 0 % (ref 0.0–0.2)

## 2019-01-04 LAB — PHOSPHORUS: Phosphorus: 4.8 mg/dL — ABNORMAL HIGH (ref 2.5–4.6)

## 2019-01-04 LAB — LEGIONELLA PNEUMOPHILA SEROGP 1 UR AG: L. pneumophila Serogp 1 Ur Ag: NEGATIVE

## 2019-01-04 LAB — BASIC METABOLIC PANEL
Anion gap: 8 (ref 5–15)
BUN: 41 mg/dL — ABNORMAL HIGH (ref 8–23)
CO2: 23 mmol/L (ref 22–32)
Calcium: 8.2 mg/dL — ABNORMAL LOW (ref 8.9–10.3)
Chloride: 109 mmol/L (ref 98–111)
Creatinine, Ser: 1.56 mg/dL — ABNORMAL HIGH (ref 0.44–1.00)
GFR calc Af Amer: 37 mL/min — ABNORMAL LOW (ref >60)
GFR calc non Af Amer: 31 mL/min — ABNORMAL LOW (ref >60)
Glucose, Bld: 233 mg/dL — ABNORMAL HIGH (ref 70–99)
Potassium: 4.4 mmol/L (ref 3.5–5.1)
Sodium: 140 mmol/L (ref 135–145)

## 2019-01-04 LAB — GLUCOSE, CAPILLARY
Glucose-Capillary: 227 mg/dL — ABNORMAL HIGH (ref 70–99)
Glucose-Capillary: 230 mg/dL — ABNORMAL HIGH (ref 70–99)
Glucose-Capillary: 277 mg/dL — ABNORMAL HIGH (ref 70–99)
Glucose-Capillary: 335 mg/dL — ABNORMAL HIGH (ref 70–99)
Glucose-Capillary: 222 mg/dL — ABNORMAL HIGH (ref 70–99)

## 2019-01-04 LAB — MAGNESIUM: Magnesium: 2.3 mg/dL (ref 1.7–2.4)

## 2019-01-04 MED ORDER — INSULIN ASPART 100 UNIT/ML ~~LOC~~ SOLN
SUBCUTANEOUS | Status: AC
  Administered 2019-01-04: 11 [IU] via SUBCUTANEOUS
  Filled 2019-01-04: qty 1

## 2019-01-04 MED ORDER — METOPROLOL TARTRATE 50 MG PO TABS
50.0000 mg | ORAL_TABLET | Freq: Two times a day (BID) | ORAL | Status: DC
  Administered 2019-01-04 – 2019-01-06 (×5): 50 mg via ORAL
  Filled 2019-01-04 (×5): qty 1

## 2019-01-04 MED ORDER — PREDNISONE 20 MG PO TABS
30.0000 mg | ORAL_TABLET | Freq: Every day | ORAL | Status: AC
  Administered 2019-01-06: 30 mg via ORAL
  Filled 2019-01-04: qty 1

## 2019-01-04 MED ORDER — ENOXAPARIN SODIUM 40 MG/0.4ML ~~LOC~~ SOLN
40.0000 mg | SUBCUTANEOUS | Status: DC
  Administered 2019-01-05 – 2019-01-06 (×2): 40 mg via SUBCUTANEOUS
  Filled 2019-01-04 (×2): qty 0.4

## 2019-01-04 MED ORDER — GUAIFENESIN 100 MG/5ML PO SOLN
5.0000 mL | ORAL | Status: DC | PRN
  Administered 2019-01-04: 100 mg via ORAL
  Filled 2019-01-04 (×3): qty 5

## 2019-01-04 MED ORDER — INSULIN ASPART 100 UNIT/ML ~~LOC~~ SOLN
0.0000 [IU] | Freq: Every day | SUBCUTANEOUS | Status: DC
  Administered 2019-01-04: 2 [IU] via SUBCUTANEOUS
  Filled 2019-01-04: qty 1

## 2019-01-04 MED ORDER — FUROSEMIDE 20 MG PO TABS: 20.0000 mg | ORAL_TABLET | Freq: Every day | ORAL | Status: DC

## 2019-01-04 MED ORDER — PREDNISONE 20 MG PO TABS: 20.0000 mg | ORAL_TABLET | Freq: Every day | ORAL | Status: DC

## 2019-01-04 MED ORDER — PREDNISONE 20 MG PO TABS
40.0000 mg | ORAL_TABLET | Freq: Every day | ORAL | Status: AC
  Administered 2019-01-05: 40 mg via ORAL
  Filled 2019-01-04: qty 2

## 2019-01-04 MED ORDER — PANTOPRAZOLE SODIUM 40 MG PO TBEC
40.0000 mg | DELAYED_RELEASE_TABLET | Freq: Every day | ORAL | Status: DC
  Administered 2019-01-04 – 2019-01-06 (×3): 40 mg via ORAL
  Filled 2019-01-04 (×3): qty 1

## 2019-01-04 MED ORDER — PREDNISONE 10 MG PO TABS: 10.0000 mg | ORAL_TABLET | Freq: Every day | ORAL | Status: DC

## 2019-01-04 MED ORDER — MENTHOL 3 MG MT LOZG
1.0000 | LOZENGE | OROMUCOSAL | Status: DC | PRN
  Filled 2019-01-04 (×2): qty 9

## 2019-01-04 NOTE — Consult Note (Signed)
PHARMACY CONSULT NOTE - FOLLOW UP  Pharmacy Consult for Electrolyte Monitoring and Replacement   Recent Labs: Potassium (mmol/L)  Date Value  01/04/2019 4.4   Magnesium (mg/dL)  Date Value  01/04/2019 2.3   Calcium (mg/dL)  Date Value  01/04/2019 8.2 (L)   Albumin (g/dL)  Date Value  01/02/2019 3.8   Phosphorus (mg/dL)  Date Value  01/04/2019 4.8 (H)   Sodium (mmol/L)  Date Value  01/04/2019 140    Assessment: Pharmacy consulted for electrolyte monitoring and replacement in 78 yo female admitted with respiratory distress.   Goal of Therapy:  Electrolytes WNL  Plan:  1/9 No electrolytes replacement needed at this time.   Will F/U with AM labs and continue to replace electrolytes as needed.   Pernell Dupre, PharmD, BCPS Clinical Pharmacist 01/04/2019 7:36 AM

## 2019-01-04 NOTE — Progress Notes (Signed)
Patient ID: Brittney [name], female   DOB: [birth year], 78 y.o.   MRN: [id number]  [facility] PROGRESS NOTE  TAKASHA VETERE EUM:[id number] DOB: 1941-07-07 DOA: 01/02/2019 PCP: Elisabeth Cara, NP  HPI/Subjective:  Intubated and sedated. Afebrile  Objective: Vitals:   01/04/19 0500 01/04/19 0600  BP: (!) 123/48 (!) 128/54  Pulse: 80 70  Resp: (!) 24 20  Temp:    SpO2: 99% 98%    Filed Weights   01/02/19 0915 01/03/19 0500 01/04/19 0500  Weight: 79 kg 82.7 kg 81.1 kg    ROS: Review of Systems  Unable to perform ROS: Acuity of condition   Exam: Physical Exam  HENT:  Nose: No mucosal edema.  Unable to look into mouth  Eyes: Pupils are equal, round, and reactive to light. Conjunctivae and lids are normal.  Neck: No JVD present. Carotid bruit is not present. No edema present. No thyroid mass and no thyromegaly present.  Cardiovascular: S1 normal and S2 normal. Exam reveals no gallop.  No murmur heard. Pulses:      Dorsalis pedis pulses are 2+ on the right side and 2+ on the left side.  Respiratory: No respiratory distress. She has decreased breath sounds in the right lower field and the left lower field. She has no wheezes. She has no rhonchi. She has rales in the right lower field and the left lower field.  GI: Soft. Bowel sounds are normal. There is no abdominal tenderness.  Musculoskeletal:     Right ankle: She exhibits no swelling.     Left ankle: She exhibits no swelling.  Lymphadenopathy:    She has no cervical adenopathy.  Neurological:  Intubated and sedated  Skin: Skin is warm. No rash noted. Nails show no clubbing.  Psychiatric:  Intubated and sedated      Data Reviewed: Basic Metabolic Panel: Recent Labs  Lab 01/02/19 0432 01/03/19 0737 01/04/19 0553  NA 139 140 140  K 3.9 3.3* 4.4  CL 105 109 109  CO2 22 18* 23  GLUCOSE 271* 178* 233*  BUN 17 33* 41*  CREATININE 0.97 2.12* 1.56*  CALCIUM 9.0 8.3* 8.2*  MG  --  1.6* 2.3  PHOS  --    --  4.8*   Liver Function Tests: Recent Labs  Lab 01/02/19 0432  AST 19  ALT 14  ALKPHOS 55  BILITOT 0.8  PROT 7.3  ALBUMIN 3.8  CBC: Recent Labs  Lab 01/02/19 0432 01/03/19 0737 01/04/19 0553  WBC 19.5* 17.5* 21.5*  NEUTROABS 12.4*  --   --   HGB 12.7 10.7* 9.4*  HCT 39.9 33.1* 29.1*  MCV 83.3 81.1 81.5  PLT 395 433* 316   Cardiac Enzymes: Recent Labs  Lab 01/02/19 0432 01/02/19 0725 01/02/19 1044  TROPONINI 0.04* 0.14* 0.28*   BNP (last 3 results) Recent Labs    08/08/18 0409 08/23/18 0114 01/02/19 0432  BNP 1,069.0* 507.0* 258.0*   Patient has come here for acute chronic CBG: Recent Labs  Lab 01/03/19 1157 01/03/19 1526 01/03/19 1927 01/03/19 2329 01/04/19 0328  GLUCAP 117* 237* 248* 188* 227*    Recent Results (from the past 240 hour(s))  Blood Culture (routine x 2)     Status: None (Preliminary result)   Collection Time: 01/02/19  4:32 AM  Result Value Ref Range Status   Specimen Description BLOOD RIGHT [facility]  Final   Special Requests   Final    BOTTLES DRAWN AEROBIC AND ANAEROBIC Blood Culture results may not be optimal  due to an excessive volume of blood received in culture bottles   Culture   Final    NO GROWTH 2 DAYS Performed at [facility], [address] [place], [place] [address]    Report Status PENDING  Incomplete  Urine culture     Status: None   Collection Time: 01/02/19  6:07 AM  Result Value Ref Range Status   Specimen Description   Final    URINE, RANDOM Performed at [facility], [address], [place], [place] [address]    Special Requests   Final    NONE Performed at [facility], [address], [place], [place] [address]    Culture   Final    NO GROWTH Performed at [facility], [place] [address] [place], [place] [address]    Report Status 01/03/2019 FINAL  Final  MRSA PCR Screening     Status: None   Collection Time: 01/02/19  9:23 AM  Result Value Ref Range Status   MRSA by  PCR NEGATIVE NEGATIVE Final    Comment:        The GeneXpert MRSA Assay (FDA approved for NASAL specimens only), is one component of a comprehensive MRSA colonization surveillance program. It is not intended to diagnose MRSA infection nor to guide or monitor treatment for MRSA infections. Performed at [facility], [address] [place], [place] [address]   Blood Culture (routine x 2)     Status: None (Preliminary result)   Collection Time: 01/02/19 10:44 AM  Result Value Ref Range Status   Specimen Description BLOOD BLOOD RIGHT HAND  Final   Special Requests   Final    BOTTLES DRAWN AEROBIC ONLY Blood Culture adequate volume   Culture   Final    NO GROWTH 2 DAYS Performed at [facility], [address] [place], [place] [address]    Report Status PENDING  Incomplete  Culture, respiratory     Status: None (Preliminary result)   Collection Time: 01/02/19  4:05 PM  Result Value Ref Range Status   Specimen Description   Final    SPUTUM Performed at [facility], [address] [place], [place] [address]    Special Requests   Final    NONE Performed at [facility], [address] [place], [place] [address]    Gram Stain   Final    MODERATE WBC PRESENT, PREDOMINANTLY MONONUCLEAR NO ORGANISMS SEEN    Culture   Final    NO GROWTH < 24 HOURS Performed at [facility], [place] [address], [place], [place] [address]    Report Status PENDING  Incomplete     Studies: Dg Abd 1 View  Result Date: 01/02/2019 CLINICAL DATA:  Status post nasogastric tube placement. EXAM: ABDOMEN - 1 VIEW COMPARISON:  Radiograph of August 07, 2018. FINDINGS: The bowel gas pattern is normal. Distal tip of nasogastric tube is seen in expected position of proximal stomach. No radio-opaque calculi or other significant radiographic abnormality are seen. IMPRESSION: Distal tip of nasogastric tube seen in expected position of proximal stomach. No evidence of  bowel obstruction or ileus. Electronically Signed   By: Marijo Conception, M.D.   On: 01/02/2019 10:31   Ct Angio Chest Pe W Or Wo Contrast  Result Date: 01/02/2019 CLINICAL DATA:  Respiratory distress, intubated EXAM: CT ANGIOGRAPHY CHEST WITH CONTRAST TECHNIQUE: Multidetector CT imaging of the chest was performed using the standard protocol during bolus administration of  intravenous contrast. Multiplanar CT image reconstructions and MIPs were obtained to evaluate the vascular anatomy. CONTRAST:  52m OMNIPAQUE IOHEXOL 350 MG/ML SOLN COMPARISON:  Chest x-ray from earlier in the same day. FINDINGS: Cardiovascular: Thoracic aorta and its branches demonstrate atherosclerotic calcifications. No aneurysmal dilatation or dissection is seen. No cardiac enlargement is noted. Coronary calcifications are identified. Pulmonary artery shows a normal branching pattern without intraluminal filling defect to suggest pulmonary embolism. Evaluation of the peripheral most branches in the lower lobes is limited due to significant consolidation related to the underlying edema. Mediastinum/Nodes: Thoracic inlet is within normal limits. No significant hilar or mediastinal adenopathy is noted. The esophagus is within normal limits. Nasogastric catheter extends into the stomach. Endotracheal tube is noted in satisfactory position above the carina Lungs/Pleura: There is diffuse alveolar density identified with consolidation primarily within the lower lobes. This is consistent with pulmonary edema given the abrupt onset from earlier in the same day. Diffuse interstitial edema is noted as well. Small effusions are noted bilaterally. No focal parenchymal nodules or mass lesions are seen although a significant amount of the lower lobes is obscured due to the consolidation. Upper Abdomen: Visualized upper abdomen shows no acute abnormality. Musculoskeletal: No acute bony abnormality is noted. Mild degenerative changes of the thoracic spine are  seen. Review of the MIP images confirms the above findings. IMPRESSION: Changes consistent with CHF with significant acute alveolar and interstitial edema identified. Consolidation is noted within the lower lobes bilaterally related to significant edematous change. No evidence of pulmonary emboli. Small effusions bilaterally. Aortic Atherosclerosis (ICD10-I70.0). Electronically Signed   By: MInez CatalinaM.D.   On: 01/02/2019 09:13   Dg Chest Port 1 View  Result Date: 01/02/2019 CLINICAL DATA:  Status post intubation. EXAM: PORTABLE CHEST 1 VIEW COMPARISON:  Radiograph of same day. FINDINGS: Stable cardiomediastinal silhouette. Atherosclerosis of thoracic aorta is noted. Endotracheal tube is seen projected over tracheal air shadow with distal tip 4 cm above the carina. Nasogastric tube is seen entering the stomach. Bilateral pulmonary edema is noted. Minimal pleural effusions are noted. No pneumothorax is noted. Bony thorax is unremarkable. IMPRESSION: Endotracheal tube in grossly good position. Stable probable bilateral pulmonary edema. Aortic Atherosclerosis (ICD10-I70.0). Electronically Signed   By: JMarijo Conception M.D.   On: 01/02/2019 10:30    Scheduled Meds: . aspirin  81 mg Oral Daily  . atorvastatin  40 mg Oral Daily  . chlorhexidine gluconate (MEDLINE KIT)  15 mL Mouth Rinse BID  . enoxaparin (LOVENOX) injection  30 mg Subcutaneous Q24H  . insulin aspart  0-15 Units Subcutaneous Q4H  . ipratropium-albuterol  3 mL Nebulization Q6H  . methylPREDNISolone (SOLU-MEDROL) injection  40 mg Intravenous Q12H  . metoprolol tartrate  50 mg Per Tube BID  .  morphine injection  2 mg Intravenous Once  . ondansetron (ZOFRAN) IV  4 mg Intravenous Once  . pantoprazole sodium  40 mg Per Tube Daily   Continuous Infusions:   Assessment/Plan:  1. Acute hypoxic respiratory failure.  Continue full vent support. 2. Cardiogenic Shock.  Patient on Neo-Synephrine.  Cardiology on board 3. Acute on chronic  diastolic congestive heart failure.  On lasix for further diuresis. Still volume overloaded 4. Metabolic acidosis, due to  lactic acidosis on admission due to poor perfusion and hypoxia 5. Hyperlipidemia on atorvastatin  Code Status:     Code Status Orders  (From admission, onward)         Start     Ordered   01/02/19  8242  Full code  Continuous     01/02/19 0708        Code Status History    Date Active Date Inactive Code Status Order ID Comments User Context   08/23/2018 0349 08/25/2018 1431 Full Code [id number]  Harrie Foreman, MD ED   08/05/2018 1759 08/11/2018 2038 Full Code [id number]  [name], MD Inpatient     Family Communication: No family at the bedside Disposition Plan: To be determined  Consultants:  Cardiology  Critical care  Antibiotics:  Stopped  Time spent: 35 minutes  [place]

## 2019-01-04 NOTE — Progress Notes (Signed)
[facility]iatric Cardiology  SUBJECTIVE: [name] is a 78 year old female who presented to the ED on 01/02/19 with respiratory distress. Room air saturation was reportedly 79% upon arrival.  Patient was intubated and admitted to ICU.   Has since been extubated with much improvement in symptoms.  On supplemental O2 with no current complaints of shortness of breath.  Denies chest pain or palpitations.  Metoprolol was resumed due to tachycardia and stable BP.  Denies lower extremity swelling.    Vitals:   01/04/19 0700 01/04/19 0800 01/04/19 0900 01/04/19 0912  BP:    (!) 142/80  Pulse: 88 97 80 87  Resp: (!) 23 20 17 16   Temp:   98.7 F (37.1 C)   TempSrc:   Oral   SpO2: 99% 96% 96% 98%  Weight:      Height:         Intake/Output Summary (Last 24 hours) at 01/04/2019 1349 Last data filed at 01/04/2019 0914 Gross per 24 hour  Intake 450.31 ml  Output 1050 ml  Net -599.69 ml      PHYSICAL EXAM  General: Well developed, well nourished, in no acute distress HEENT:  Normocephalic and atramatic Neck:  No JVD.  Lungs: Clear bilaterally to auscultation and percussion. Heart: HRRR . Normal S1 and S2 without gallops or murmurs.  Abdomen: Bowel sounds are positive, abdomen soft and non-tender  Msk:  Back normal. Gait not assessed. Normal strength and tone for age. Extremities: No clubbing, cyanosis or edema.   Neuro: Alert and oriented X 3. Psych:  Good affect, responds appropriately   LABS: Basic Metabolic Panel: Recent Labs    01/03/19 0737 01/04/19 0553  NA 140 140  K 3.3* 4.4  CL 109 109  CO2 18* 23  GLUCOSE 178* 233*  BUN 33* 41*  CREATININE 2.12* 1.56*  CALCIUM 8.3* 8.2*  MG 1.6* 2.3  PHOS  --  4.8*   Liver Function Tests: Recent Labs    01/02/19 0432  AST 19  ALT 14  ALKPHOS 55  BILITOT 0.8  PROT 7.3  ALBUMIN 3.8   No results for input(s): LIPASE, AMYLASE in the last 72 hours. CBC: Recent Labs    01/02/19 0432 01/03/19 0737 01/04/19 0553  WBC 19.5* 17.5* 21.5*   NEUTROABS 12.4*  --   --   HGB 12.7 10.7* 9.4*  HCT 39.9 33.1* 29.1*  MCV 83.3 81.1 81.5  PLT 395 433* 316   Cardiac Enzymes: Recent Labs    01/02/19 0432 01/02/19 0725 01/02/19 1044  TROPONINI 0.04* 0.14* 0.28*   BNP: Invalid input(s): POCBNP D-Dimer: No results for input(s): DDIMER in the last 72 hours. Hemoglobin A1C: No results for input(s): HGBA1C in the last 72 hours. Fasting Lipid Panel: No results for input(s): CHOL, HDL, LDLCALC, TRIG, CHOLHDL, LDLDIRECT in the last 72 hours. Thyroid Function Tests: No results for input(s): TSH, T4TOTAL, T3FREE, THYROIDAB in the last 72 hours.  Invalid input(s): FREET3 Anemia Panel: No results for input(s): VITAMINB12, FOLATE, FERRITIN, TIBC, IRON, RETICCTPCT in the last 72 hours.  No results found.   Echocardiogram: Mildly reduced LV function with an EF estimated at 45-50%.   TELEMETRY: Normal sinus rhythm   ASSESSMENT AND PLAN:  Active Problems:   Acute hypoxemic respiratory failure (HCC)    1.  Chronic heart failure   -Extubated, much improvement in symptoms; responded well to Lasix, appears euvolemic on exam   -Further ischemic workup may be warranted per Dr. Clayborn Bigness  2.  Respiratory failure   -  Continue supplemental O2 as needed  3.  Tachycardia   -Improved, continue metoprolol   The history, physical exam findings, and plan of care were all discussed with Dr. Bartholome Bill, and all decision making was made in collaboration.   Avie Arenas  PA-C 01/04/2019 1:49 PM

## 2019-01-04 NOTE — Progress Notes (Signed)
Patient ID: Brittney [name], female   DOB: [birth year], 78 y.o.   MRN: [id number]  [employer] PROGRESS NOTE  Brittney [name] WUJ:[id number] DOB: 1941-08-09 DOA: 01/02/2019 PCP: Elisabeth Cara, NP  HPI/Subjective: Patient feeling better.  Breathing comfortably on nasal cannula.  Objective: Vitals:   01/04/19 0900 01/04/19 0912  BP:  (!) 142/80  Pulse: 80 87  Resp: 17 16  Temp: 98.7 F (37.1 C)   SpO2: 96% 98%    Filed Weights   01/02/19 0915 01/03/19 0500 01/04/19 0500  Weight: 79 kg 82.7 kg 81.1 kg    ROS: Review of Systems  Constitutional: Negative for chills and fever.  Eyes: Negative for blurred vision.  Respiratory: Negative for cough and shortness of breath.   Cardiovascular: Negative for chest pain.  Gastrointestinal: Negative for abdominal pain, constipation, diarrhea, nausea and vomiting.  Genitourinary: Negative for dysuria.  Musculoskeletal: Negative for joint pain.  Neurological: Negative for dizziness and headaches.   Exam: Physical Exam  Constitutional: She is oriented to person, place, and time.  HENT:  Nose: No mucosal edema.  Mouth/Throat: No oropharyngeal exudate or posterior oropharyngeal edema.  Eyes: Pupils are equal, round, and reactive to light. Conjunctivae, EOM and lids are normal.  Neck: No JVD present. Carotid bruit is not present. No edema present. No thyroid mass and no thyromegaly present.  Cardiovascular: S1 normal and S2 normal. Exam reveals no gallop.  No murmur heard. Pulses:      Dorsalis pedis pulses are 2+ on the right side and 2+ on the left side.  Respiratory: No respiratory distress. She has decreased breath sounds in the right lower field and the left lower field. She has no wheezes. She has no rhonchi. She has rales in the right lower field and the left lower field.  GI: Soft. Bowel sounds are normal. There is no abdominal tenderness.  Musculoskeletal:     Right ankle: She exhibits no swelling.     Left ankle: She  exhibits no swelling.  Lymphadenopathy:    She has no cervical adenopathy.  Neurological: She is alert and oriented to person, place, and time. No cranial nerve deficit.  Skin: Skin is warm. No rash noted. Nails show no clubbing.  Psychiatric: She has a normal mood and affect.      Data Reviewed: Basic Metabolic Panel: Recent Labs  Lab 01/02/19 0432 01/03/19 0737 01/04/19 0553  NA 139 140 140  K 3.9 3.3* 4.4  CL 105 109 109  CO2 22 18* 23  GLUCOSE 271* 178* 233*  BUN 17 33* 41*  CREATININE 0.97 2.12* 1.56*  CALCIUM 9.0 8.3* 8.2*  MG  --  1.6* 2.3  PHOS  --   --  4.8*   Liver Function Tests: Recent Labs  Lab 01/02/19 0432  AST 19  ALT 14  ALKPHOS 55  BILITOT 0.8  PROT 7.3  ALBUMIN 3.8   CBC: Recent Labs  Lab 01/02/19 0432 01/03/19 0737 01/04/19 0553  WBC 19.5* 17.5* 21.5*  NEUTROABS 12.4*  --   --   HGB 12.7 10.7* 9.4*  HCT 39.9 33.1* 29.1*  MCV 83.3 81.1 81.5  PLT 395 433* 316   Cardiac Enzymes: Recent Labs  Lab 01/02/19 0432 01/02/19 0725 01/02/19 1044  TROPONINI 0.04* 0.14* 0.28*   BNP (last 3 results) Recent Labs    08/08/18 0409 08/23/18 0114 01/02/19 0432  BNP 1,069.0* 507.0* 258.0*    CBG: Recent Labs  Lab 01/03/19 1927 01/03/19 2329 01/04/19 0328 01/04/19 0749 01/04/19  [place]*    Recent Results (from the past 240 hour(s))  Blood Culture (routine x 2)     Status: None (Preliminary result)   Collection Time: 01/02/19  4:32 AM  Result Value Ref Range Status   Specimen Description BLOOD RIGHT AC  Final   Special Requests   Final    BOTTLES DRAWN AEROBIC AND ANAEROBIC Blood Culture results may not be optimal due to an excessive volume of blood received in culture bottles   Culture   Final    NO GROWTH 2 DAYS Performed at [facility], [address], [place], [place] [address]    Report Status PENDING  Incomplete  Urine culture     Status: None   Collection Time: 01/02/19  6:07 AM   Result Value Ref Range Status   Specimen Description   Final    URINE, RANDOM Performed at [facility], [address] [place], [place] [address]    Special Requests   Final    NONE Performed at [facility], [address], [place], [place] [address]    Culture   Final    NO GROWTH Performed at [facility], [place] [address], [place], [place] [address]    Report Status 01/03/2019 FINAL  Final  MRSA PCR Screening     Status: None   Collection Time: 01/02/19  9:23 AM  Result Value Ref Range Status   MRSA by PCR NEGATIVE NEGATIVE Final    Comment:        The GeneXpert MRSA Assay (FDA approved for NASAL specimens only), is one component of a comprehensive MRSA colonization surveillance program. It is not intended to diagnose MRSA infection nor to guide or monitor treatment for MRSA infections. Performed at [facility], [address] [place], [place] [address]   Blood Culture (routine x 2)     Status: None (Preliminary result)   Collection Time: 01/02/19 10:44 AM  Result Value Ref Range Status   Specimen Description BLOOD BLOOD RIGHT HAND  Final   Special Requests   Final    BOTTLES DRAWN AEROBIC ONLY Blood Culture adequate volume   Culture   Final    NO GROWTH 2 DAYS Performed at [facility], [address] [place], [place] [address]    Report Status PENDING  Incomplete  Culture, respiratory     Status: None (Preliminary result)   Collection Time: 01/02/19  4:05 PM  Result Value Ref Range Status   Specimen Description   Final    SPUTUM Performed at [facility] [address], [place], [place] [address]    Special Requests   Final    NONE Performed at [facility], [place] [place], [place] [address]    Gram Stain   Final    MODERATE WBC PRESENT, PREDOMINANTLY MONONUCLEAR NO ORGANISMS SEEN    Culture   Final    CULTURE REINCUBATED FOR BETTER GROWTH Performed at [place], [place] [address], [place],  [address]    Report Status PENDING  Incomplete     Scheduled Meds: . aspirin  81 mg Oral Daily  . atorvastatin  40 mg Oral Daily  . chlorhexidine gluconate (MEDLINE KIT)  15 mL Mouth Rinse BID  . [START ON 01/05/2019] enoxaparin (LOVENOX) injection  40 mg Subcutaneous Q24H  . [START ON 01/05/2019] furosemide  20 mg Oral Daily  . insulin aspart  0-15 Units  Subcutaneous Q4H  . ipratropium-albuterol  3 mL Nebulization Q6H  . metoprolol tartrate  50 mg Oral BID  .  morphine injection  2 mg Intravenous Once  . ondansetron (ZOFRAN) IV  4 mg Intravenous Once  . pantoprazole  40 mg Oral Daily  . [START ON 01/05/2019] predniSONE  40 mg Oral Q breakfast   Followed by  . [START ON 01/06/2019] predniSONE  30 mg Oral Q breakfast   Followed by  . [START ON 01/07/2019] predniSONE  20 mg Oral Q breakfast   Followed by  . [START ON 01/08/2019] predniSONE  10 mg Oral Q breakfast   Continuous Infusions:  Assessment/Plan:  1. Acute hypoxic respiratory failure.  Patient on oxygen nasal cannula. 2. Shock has resolved.  Patient off Neo-Synephrine. 3. Acute diastolic congestive heart failure.  Patient put on low-dose oral Lasix, metoprolol 4. Metabolic acidosis and lactic acidosis. 5. Hyperlipidemia unspecified on atorvastatin  Code Status:     Code Status Orders  (From admission, onward)         Start     Ordered   01/02/19 0709  Full code  Continuous     01/02/19 0708        Code Status History    Date Active Date Inactive Code Status Order ID Comments User Context   08/23/2018 0349 08/25/2018 1431 Full Code [id number]  Harrie Foreman, MD ED   08/05/2018 1759 08/11/2018 2038 Full Code [id number]  [name], MD Inpatient     Family Communication: Spoke with daughter at the bedside Disposition Plan: To be determined  Consultants:  Cardiology  Time spent: 25 minutes  [place]

## 2019-01-04 NOTE — Progress Notes (Signed)
Talked to Dr. Jannifer Franklin about patient's complaints of unable to cough mucus, order for mucinex given. RN will continue to monitor.

## 2019-01-05 ENCOUNTER — Other Ambulatory Visit: Payer: Self-pay

## 2019-01-05 LAB — BASIC METABOLIC PANEL
Anion gap: 7 (ref 5–15)
BUN: 47 mg/dL — ABNORMAL HIGH (ref 8–23)
CO2: 23 mmol/L (ref 22–32)
Calcium: 8.2 mg/dL — ABNORMAL LOW (ref 8.9–10.3)
Chloride: 108 mmol/L (ref 98–111)
Creatinine, Ser: 1.51 mg/dL — ABNORMAL HIGH (ref 0.44–1.00)
GFR calc Af Amer: 38 mL/min — ABNORMAL LOW (ref >60)
GFR calc non Af Amer: 33 mL/min — ABNORMAL LOW (ref >60)
Glucose, Bld: 257 mg/dL — ABNORMAL HIGH (ref 70–99)
Potassium: 4.3 mmol/L (ref 3.5–5.1)
Sodium: 138 mmol/L (ref 135–145)

## 2019-01-05 LAB — GLUCOSE, CAPILLARY
Glucose-Capillary: 199 mg/dL — ABNORMAL HIGH (ref 70–99)
Glucose-Capillary: 311 mg/dL — ABNORMAL HIGH (ref 70–99)
Glucose-Capillary: 299 mg/dL — ABNORMAL HIGH (ref 70–99)
Glucose-Capillary: 391 mg/dL — ABNORMAL HIGH (ref 70–99)

## 2019-01-05 LAB — CULTURE, RESPIRATORY W GRAM STAIN: Culture: NORMAL

## 2019-01-05 LAB — CULTURE, RESPIRATORY

## 2019-01-05 MED ORDER — INSULIN ASPART 100 UNIT/ML ~~LOC~~ SOLN
4.0000 [IU] | Freq: Three times a day (TID) | SUBCUTANEOUS | Status: DC
  Administered 2019-01-05 – 2019-01-06 (×4): 4 [IU] via SUBCUTANEOUS
  Filled 2019-01-05 (×4): qty 1

## 2019-01-05 MED ORDER — FUROSEMIDE 10 MG/ML IJ SOLN
40.0000 mg | Freq: Every day | INTRAMUSCULAR | Status: DC
  Administered 2019-01-05: 40 mg via INTRAVENOUS
  Filled 2019-01-05: qty 4

## 2019-01-05 MED ORDER — IRBESARTAN 150 MG PO TABS
75.0000 mg | ORAL_TABLET | Freq: Every day | ORAL | Status: DC
  Administered 2019-01-05 – 2019-01-06 (×2): 75 mg via ORAL
  Filled 2019-01-05 (×2): qty 1

## 2019-01-05 MED ORDER — FUROSEMIDE 40 MG PO TABS
40.0000 mg | ORAL_TABLET | Freq: Every day | ORAL | Status: DC
  Administered 2019-01-06: 40 mg via ORAL
  Filled 2019-01-05: qty 1

## 2019-01-05 MED ORDER — INSULIN ASPART 100 UNIT/ML ~~LOC~~ SOLN
0.0000 [IU] | Freq: Every day | SUBCUTANEOUS | Status: DC
  Administered 2019-01-05: 5 [IU] via SUBCUTANEOUS
  Filled 2019-01-05: qty 1

## 2019-01-05 MED ORDER — LORATADINE 10 MG PO TABS
10.0000 mg | ORAL_TABLET | Freq: Every day | ORAL | Status: DC
  Administered 2019-01-06: 10 mg via ORAL
  Filled 2019-01-05: qty 1

## 2019-01-05 MED ORDER — INSULIN ASPART 100 UNIT/ML ~~LOC~~ SOLN
0.0000 [IU] | Freq: Three times a day (TID) | SUBCUTANEOUS | Status: DC
  Administered 2019-01-05: 5 [IU] via SUBCUTANEOUS
  Administered 2019-01-05 – 2019-01-06 (×2): 7 [IU] via SUBCUTANEOUS
  Administered 2019-01-06: 3 [IU] via SUBCUTANEOUS
  Filled 2019-01-05 (×4): qty 1

## 2019-01-05 NOTE — Evaluation (Signed)
Physical Therapy Evaluation Patient Details Name: Brittney [name] MRN: [id number] DOB: Feb 14, 1941 Today's Date: 01/05/2019   History of Present Illness  78 year old female who presented to the ED on 01/02/19 with respiratory distress. Room air saturation was reportedly 79% upon arrival.  Patient was intubated and admitted to ICU, extubated 01/03/2019. PMH of DM, HLD, HTN, COPD.    Clinical Impression  PT A&Ox4, alert, behavior WFLs during PT. Pt reported living with daughters that rotate to be available 24/7 as needed in 1 story house. Independent in ADLs, able to perform some IADLs though prefers to have daughters perform (cooking/cleaning), ambulates in community with SPC, no device for household ambulation.   Pt demonstrated bed mobility and transfers including from standard commode mod I. Ambulated ~265ft without AD, with supervision/ CGA with lateral trunk sway bilaterally and wide BOS though steady. Able to participate in higher level ambulation and balance activities without LOB. HR and spO2 on room air stable and WFLs throughout mobility. The patient demonstrated and reported return to baseline level of functioning, no further acute PT needs indicated. PT to sign off. Please reconsult PT if pt status changes or acute needs are identified.     Follow Up Recommendations No PT follow up    Equipment Recommendations  None recommended by PT    Recommendations for Other Services       Precautions / Restrictions Precautions Precautions: Fall Restrictions Weight Bearing Restrictions: No      Mobility  Bed Mobility Overal bed mobility: Independent                Transfers Overall transfer level: Modified independent Equipment used: None             General transfer comment: Pt demonstrated commode transfer as well, uses sink vanity to assist with sit to stand  Ambulation/Gait Ambulation/Gait assistance: Supervision;Min guard Gait Distance (Feet): 200 Feet Assistive  device: None       General Gait Details: Lateral sway noted with ambulation, wide BOS overall steady  Stairs            Wheelchair Mobility    Modified Rankin (Stroke Patients Only)       Balance Overall balance assessment: Modified Independent Sitting-balance support: Feet supported Sitting balance-Leahy Scale: Good       Standing balance-Leahy Scale: Good               High level balance activites: Turns;Direction changes;Sudden stops;Head turns High Level Balance Comments: unable to perform SLR without handheld assist >10seconds. Pt performed higher level balance activities without LOB or significant change in gait speed.             Pertinent Vitals/Pain Pain Assessment: No/denies pain    Home Living Family/patient expects to be discharged to:: Private residence Living Arrangements: Children Available Help at Discharge: Family;Available 24 hours/day Type of Home: House Home Access: Stairs to enter Entrance Stairs-Rails: None Entrance Stairs-Number of Steps: 3 Home Layout: One level Home Equipment: Walker - 2 wheels;Cane - single point;Bedside commode;Walker - 4 wheels      Prior Function Level of Independence: Independent with assistive device(s)         Comments: Pt reported being independent in ambulation (uses [facility] for community ambulation when family reminds her), ADLs, family assists with IADLs (grocery shopping, driving, cooking).      Hand Dominance        Extremity/Trunk Assessment   Upper Extremity Assessment Upper Extremity Assessment: Overall WFL for tasks assessed(grossly  4+/5)    Lower Extremity Assessment Lower Extremity Assessment: Overall WFL for tasks assessed(grossly 4/5)       Communication   Communication: No difficulties  Cognition Arousal/Alertness: Awake/alert Behavior During Therapy: WFL for tasks assessed/performed Overall Cognitive Status: Within Functional Limits for tasks assessed                                         General Comments      Exercises     Assessment/Plan    PT Assessment Patent does not need any further PT services  PT Problem List         PT Treatment Interventions      PT Goals (Current goals can be found in the Care Plan section)       Frequency     Barriers to discharge        Co-evaluation               AM-PAC PT "6 Clicks" Mobility  Outcome Measure Help needed turning from your back to your side while in a flat bed without using bedrails?: None Help needed moving from lying on your back to sitting on the side of a flat bed without using bedrails?: None Help needed moving to and from a bed to a chair (including a wheelchair)?: None Help needed standing up from a chair using your arms (e.g., wheelchair or bedside chair)?: None Help needed to walk in hospital room?: None Help needed climbing 3-5 steps with a railing? : A Little 6 Click Score: 23    End of Session Equipment Utilized During Treatment: Gait belt Activity Tolerance: Patient tolerated treatment well Patient left: with chair alarm set;in chair;with family/visitor present;with call bell/phone within reach Nurse Communication: Mobility status      Time: 1317-1340 PT Time Calculation (min) (ACUTE ONLY): 23 min   Charges:   PT Evaluation $PT Eval Low Complexity: 1 Low PT Treatments $Neuromuscular Re-education: 8-22 mins      Lieutenant Diego PT, DPT 3:35 PM,01/05/19 [phone]

## 2019-01-05 NOTE — Care Management Important Message (Signed)
Copy of signed Medicare IM left with patient in room. 

## 2019-01-05 NOTE — Care Management Note (Signed)
Case Management Note  Patient Details  Name: Brittney [name] MRN: [id number] Date of Birth: Oct 03, 1941  Subjective/Objective:    Patient is from home with daughter.  Admitted acute diastolic heart failure and pneumonia.  She denies difficulties obtaining medications at [facility] in [place] or with accessing medical care.  She is current with [facility].  Uses a cane for assistance.  She does have a scale at home.  Plan to do cardiac catheterization at a later date due to kidney function.  Has used [facility] in the past; referral made to [facility] for home health RN, PT.      Action/Plan:   Expected Discharge Date:                  Expected Discharge Plan:  [place]  In-House Referral:     Discharge planning Services  CM Consult  Post Acute Care Choice:    Choice offered to:     DME Arranged:    DME Agency:     HH Arranged:    [place] Agency:  [place]  Status of Service:  In process, will continue to follow  If discussed at Long Length of Stay Meetings, dates discussed:    Additional Comments:  Elza Rafter, RN 01/05/2019, 12:47 PM

## 2019-01-05 NOTE — Progress Notes (Signed)
Cardiovascular and Pulmonary Nurse Navigator Note:   78 year old female admitted with acute hypoxic respiratory failure, shock (resolved), and acute diastolic CHF.  PMHx DM, GERD, HLD, HTN, vitamin deficiency.    CHF Education:?? Educational session with patient and daughter Tye Maryland) completed. Note:  Tye Maryland lives with patient and stated she is with her patient all the time.  Provided patient with "Living Better with Heart Failure" packet. Briefly reviewed definition of heart failure and signs and symptoms of an exacerbation.?Explained to patient that HF is a chronic illness which requires self-assessment / self-management along with help from the cardiologist/PCP.?? ? *Reviewed importance of and reason behind checking weight daily in the AM, after using the bathroom, but before getting dressed. Patient has functioning scales.  ? *Reviewed with patient the following information: *Discussed when to call the Dr= weight gain of >2-3lb overnight of 5lb in a week,  *Discussed yellow zone= call MD: weight gain of >2-3lb overnight of 5lb in a week, increased swelling, increased SOB when lying down, chest discomfort, dizziness, increased fatigue *Red Zone= call 911: struggle to breath, fainting or near fainting, significant chest pain   *Diet - Reviewed low sodium diet-provided handout of recommended and not recommended foods. Dietitian Consultation for education completed today.  ? *Discussed fluid intake with patient as well. Patient not currently on a fluid restriction, but advised no more than 64 ounces of fluids per day.?Patient stated she probably drinks too many fluids as she drinks a large container of coffee the size of the bedside water pitcher every morning.   ? *Instructed patient to take medications as prescribed for heart failure. Explained briefly why pt is on the medications (either make you feel better, live longer or keep you out of the hospital) and discussed monitoring and side effects.   ? *Discussed exercise/activity. PT evaluated patient today and recommended no PT follow-up.  Patient ambulates with cane outside of the house.   Informed patient with dx of CHF with EF of 45-50% per Medicare guidelines patient qualifies for Pulmonary Rehab.  Overview of program provided. Brochure provided.   Referral for Pulmonary Rehab entered.   Encouraged patient to remain as active as possible.   ? *Smoking Cessation- Patient is a former smoker.?Patient stated she quit smoking back in the summer.   ? *[facility] - Explained the purpose of the HF Clinic. ?Explained to patient the HF Clinic does not replace PCP nor Cardiologist, but is an additional resource to helping patient manage heart failure at home. Patient's new patient appointment is scheduled for 01/17/2019 at 2 p.m.  Patient and daughter agreeable to patient being followed in the [facility].   Again, the 5 Steps to Living Better with Heart Failure were reviewed with patient.  ? Patient / daughter thanked me for providing the above information. ? ? Roanna Epley, RN, BSN, [facility]? [place] [facility]  Cardiovascular &?Pulmonary Nurse Navigator  Direct Line: [phone]  Department Phone #: [phone] Fax: [phone]? Email Address: [email or web address]

## 2019-01-05 NOTE — Progress Notes (Signed)
Patient ID: Brittney [name], female   DOB: November 21, 1941, 78 y.o.   MRN: [id number]  [employer] PROGRESS NOTE  Brittney [name]:[id number] DOB: [birth year] DOA: 01/02/2019 PCP: Elisabeth Cara, NP  HPI/Subjective: Patient feeling better.  Breathing better.  Offers no complaints.  Objective: Vitals:   01/05/19 0500 01/05/19 0738  BP: 137/66 (!) 157/71  Pulse: 82 78  Resp: 18   Temp: 97.8 F (36.6 C) 98.1 F (36.7 C)  SpO2: 92% 94%    Filed Weights   01/03/19 0500 01/04/19 0500 01/05/19 0500  Weight: 82.7 kg 81.1 kg 80.7 kg    ROS: Review of Systems  Constitutional: Negative for chills and fever.  Eyes: Negative for blurred vision.  Respiratory: Negative for cough and shortness of breath.   Cardiovascular: Negative for chest pain.  Gastrointestinal: Negative for abdominal pain, constipation, diarrhea, nausea and vomiting.  Genitourinary: Negative for dysuria.  Musculoskeletal: Negative for joint pain.  Neurological: Negative for dizziness and headaches.   Exam: Physical Exam  Constitutional: She is oriented to person, place, and time.  HENT:  Nose: No mucosal edema.  Mouth/Throat: No oropharyngeal exudate or posterior oropharyngeal edema.  Eyes: Pupils are equal, round, and reactive to light. Conjunctivae, EOM and lids are normal.  Neck: No JVD present. Carotid bruit is not present. No edema present. No thyroid mass and no thyromegaly present.  Cardiovascular: S1 normal and S2 normal. Exam reveals no gallop.  No murmur heard. Pulses:      Dorsalis pedis pulses are 2+ on the right side and 2+ on the left side.  Respiratory: No respiratory distress. She has decreased breath sounds in the right lower field and the left lower field. She has no wheezes. She has rhonchi in the right lower field and the left lower field. She has no rales.  GI: Soft. Bowel sounds are normal. There is no abdominal tenderness.  Musculoskeletal:     Right ankle: She exhibits no swelling.      Left ankle: She exhibits no swelling.  Lymphadenopathy:    She has no cervical adenopathy.  Neurological: She is alert and oriented to person, place, and time. No cranial nerve deficit.  Skin: Skin is warm. No rash noted. Nails show no clubbing.  Psychiatric: She has a normal mood and affect.      Data Reviewed: Basic Metabolic Panel: Recent Labs  Lab 01/02/19 0432 01/03/19 0737 01/04/19 0553 01/05/19 0509  NA 139 140 140 138  K 3.9 3.3* 4.4 4.3  CL 105 109 109 108  CO2 22 18* 23 23  GLUCOSE 271* 178* 233* 257*  BUN 17 33* 41* 47*  CREATININE 0.97 2.12* 1.56* 1.51*  CALCIUM 9.0 8.3* 8.2* 8.2*  MG  --  1.6* 2.3  --   PHOS  --   --  4.8*  --    Liver Function Tests: Recent Labs  Lab 01/02/19 0432  AST 19  ALT 14  ALKPHOS 55  BILITOT 0.8  PROT 7.3  ALBUMIN 3.8   CBC: Recent Labs  Lab 01/02/19 0432 01/03/19 0737 01/04/19 0553  WBC 19.5* 17.5* 21.5*  NEUTROABS 12.4*  --   --   HGB 12.7 10.7* 9.4*  HCT 39.9 33.1* 29.1*  MCV 83.3 81.1 81.5  PLT 395 433* 316   Cardiac Enzymes: Recent Labs  Lab 01/02/19 0432 01/02/19 0725 01/02/19 1044  TROPONINI 0.04* 0.14* 0.28*   BNP (last 3 results) Recent Labs    08/08/18 0409 08/23/18 0114 01/02/19 0432  BNP  1,069.0* 507.0* 258.0*    CBG: Recent Labs  Lab 01/04/19 1137 01/04/19 1627 01/04/19 2129 01/05/19 0739 01/05/19 1203  GLUCAP 277* 335* 222* 199* 311*    Recent Results (from the past 240 hour(s))  Blood Culture (routine x 2)     Status: None (Preliminary result)   Collection Time: 01/02/19  4:32 AM  Result Value Ref Range Status   Specimen Description BLOOD RIGHT AC  Final   Special Requests   Final    BOTTLES DRAWN AEROBIC AND ANAEROBIC Blood Culture results may not be optimal due to an excessive volume of blood received in culture bottles   Culture   Final    NO GROWTH 3 DAYS Performed at [facility], [address], [place], [place] [address]    Report Status PENDING   Incomplete  Urine culture     Status: None   Collection Time: 01/02/19  6:07 AM  Result Value Ref Range Status   Specimen Description   Final    URINE, RANDOM Performed at [facility], [address], [place], [place] [address]    Special Requests   Final    NONE Performed at [facility], [address] [place], [place] [address]    Culture   Final    NO GROWTH Performed at [facility], [place] [address], [place], [place] [address]    Report Status 01/03/2019 FINAL  Final  MRSA PCR Screening     Status: None   Collection Time: 01/02/19  9:23 AM  Result Value Ref Range Status   MRSA by PCR NEGATIVE NEGATIVE Final    Comment:        The GeneXpert MRSA Assay (FDA approved for NASAL specimens only), is one component of a comprehensive MRSA colonization surveillance program. It is not intended to diagnose MRSA infection nor to guide or monitor treatment for MRSA infections. Performed at [facility], [address] [place], [place] [address]   Blood Culture (routine x 2)     Status: None (Preliminary result)   Collection Time: 01/02/19 10:44 AM  Result Value Ref Range Status   Specimen Description BLOOD BLOOD RIGHT HAND  Final   Special Requests   Final    BOTTLES DRAWN AEROBIC ONLY Blood Culture adequate volume   Culture   Final    NO GROWTH 3 DAYS Performed at [facility], [address], [place], [place] [address]    Report Status PENDING  Incomplete  Culture, respiratory     Status: None   Collection Time: 01/02/19  4:05 PM  Result Value Ref Range Status   Specimen Description   Final    SPUTUM Performed at [facility], [address], [place], [place] [address]    Special Requests   Final    NONE Performed at [facility], [address] [place], [place] [address]    Gram Stain   Final    MODERATE WBC PRESENT, PREDOMINANTLY MONONUCLEAR NO ORGANISMS SEEN    Culture   Final    RARE  Consistent with normal respiratory flora. Performed at [facility], [place] [address], [place], [place] [address]    Report Status 01/05/2019 FINAL  Final     Scheduled Meds: . aspirin  81 mg Oral Daily  . atorvastatin  40 mg Oral Daily  . chlorhexidine gluconate (MEDLINE KIT)  15 mL Mouth Rinse BID  . enoxaparin (LOVENOX) injection  40 mg Subcutaneous Q24H  .  furosemide  40 mg Intravenous Daily  . insulin aspart  0-5 Units Subcutaneous QHS  . insulin aspart  0-9 Units Subcutaneous TID WC  . insulin aspart  4 Units Subcutaneous TID WC  . ipratropium-albuterol  3 mL Nebulization Q6H  . irbesartan  75 mg Oral Daily  . [START ON 01/06/2019] loratadine  10 mg Oral Daily  . metoprolol tartrate  50 mg Oral BID  .  morphine injection  2 mg Intravenous Once  . ondansetron (ZOFRAN) IV  4 mg Intravenous Once  . pantoprazole  40 mg Oral Daily  . [START ON 01/06/2019] predniSONE  30 mg Oral Q breakfast   Followed by  . [START ON 01/07/2019] predniSONE  20 mg Oral Q breakfast   Followed by  . [START ON 01/08/2019] predniSONE  10 mg Oral Q breakfast   Continuous Infusions:  Assessment/Plan:  1. Acute hypoxic respiratory failure.  Patient tapered to room air.  This has improved. 2. Shock has resolved.  Patient off Neo-Synephrine. 3. Acute diastolic congestive heart failure.  Patient on IV Lasix today.  Hopefully switch over to oral tomorrow.  Case discussed with cardiology Dr. Ubaldo Glassing.  Potentially to do a cardiac catheterization as outpatient.  Reevaluate tomorrow.  For disposition 4. Metabolic acidosis and lactic acidosis. 5. Hyperlipidemia unspecified on atorvastatin  Code Status:     Code Status Orders  (From admission, onward)         Start     Ordered   01/02/19 0709  Full code  Continuous     01/02/19 0708        Code Status History    Date Active Date Inactive Code Status Order ID Comments User Context   08/23/2018 0349 08/25/2018 1431 Full Code [id number]  Harrie Foreman, MD ED   08/05/2018 1759 08/11/2018 2038 Full Code [id number]  [name], MD Inpatient     Family Communication: Spoke with daughter yesterday Disposition Plan: Potential discharge tomorrow  Consultants:  Cardiology  Time spent: 26 minutes  [place]

## 2019-01-05 NOTE — Plan of Care (Signed)
  Problem: Education: Goal: Knowledge of General Education information will improve Description Including pain rating scale, medication(s)/side effects and non-pharmacologic comfort measures Outcome: Progressing   Problem: Health Behavior/Discharge Planning: Goal: Ability to manage health-related needs will improve Outcome: Progressing   Problem: Clinical Measurements: Goal: Will remain free from infection Outcome: Progressing Note:  Remains afebrile, PO steroids Goal: Diagnostic test results will improve Outcome: Progressing Note:  Labs remain stable at this time Goal: Respiratory complications will improve Outcome: Progressing Note:  Pt on PO steroids, remains on RA   Problem: Activity: Goal: Ability to tolerate increased activity will improve Outcome: Progressing Note:  Patient to work with PT today

## 2019-01-05 NOTE — Progress Notes (Signed)
Inpatient Diabetes Program Recommendations  AACE/ADA: New Consensus Statement on Inpatient Glycemic Control  Target Ranges:  Prepandial:   less than 140 mg/dL      Peak postprandial:   less than 180 mg/dL (1-2 hours)      Critically ill patients:  140 - 180 mg/dL   Results for Brittney, [name] (MRN [id number]) as of 01/05/2019 08:56  Ref. Range 01/04/2019 07:49 01/04/2019 11:37 01/04/2019 16:27 01/04/2019 21:29 01/05/2019 07:39  Glucose-Capillary Latest Ref Range: 70 - 99 mg/dL 230 (H) 277 (H) 335 (H) 222 (H) 199 (H)   Review of Glycemic Control  Diabetes history: DM2 Outpatient Diabetes medications:  Glucotrol XL 10 mg BID, Victoza 1.8 mg daily, Metformin 1000 mg BID,  Ozempic 0.25 mg once a week, Januvia 100 mg daily Current orders for Inpatient glycemic control: Novolog 0-5 units QHS; Prednisone taper  Inpatient Diabetes Program Recommendations:   Correction (SSI): Patient was ordered Novolog Q4H on 01/04/19 and it was changed to Novolog 0-5 units QHS.  Please consider ordering Novolog 0-9 units TID with meals.  Insulin-Meal Coverage: Please consider ordering Novolog 4 units TID with meals for meal coverage if patient eats at least 50% of meals.  Thanks, Barnie Alderman, RN, MSN, CDE Diabetes Coordinator Inpatient Diabetes Program [phone] (Team Pager from 8am to 5pm)

## 2019-01-05 NOTE — Consult Note (Signed)
PHARMACY CONSULT NOTE - FOLLOW UP  Pharmacy Consult for Electrolyte Monitoring and Replacement   Recent Labs: Potassium (mmol/L)  Date Value  01/05/2019 4.3   Magnesium (mg/dL)  Date Value  01/04/2019 2.3   Calcium (mg/dL)  Date Value  01/05/2019 8.2 (L)   Albumin (g/dL)  Date Value  01/02/2019 3.8   Phosphorus (mg/dL)  Date Value  01/04/2019 4.8 (H)   Sodium (mmol/L)  Date Value  01/05/2019 138    Assessment: Pharmacy consulted for electrolyte monitoring and replacement in 78 yo female admitted with respiratory distress.   Goal of Therapy:  Electrolytes WNL  Plan:  Electrolytes WNL. Pharmacy will sign off this CCM consult since patient is now on the floor. Please re-consult if further assistance is desired.  Laural Benes, PharmD, BCPS Clinical Pharmacist 01/05/2019 7:29 AM

## 2019-01-05 NOTE — Plan of Care (Signed)
Nutrition Education Note  RD consulted for nutrition education regarding new onset CHF.  Spoke with pt and daughter at bedside.  Pt reports that she hasn't added salt to her foods when cooking or at the table "for years." Pt reports that she seasons her food with black pepper.  Pt shares that she typically eats 3 meals daily.  Breakfast: eggs and toast Lunch: [place] sandwich and piece of fruit (banana or orange) Dinner: "vegetable plate" either homemade or from [employer] (includes okra, beans, green beans, greens, etc.)  Pt states that she weighs herself frequently and has not noticed any changes in her weight recently.  RD provided "Low Sodium Nutrition Therapy" handout from the Academy of Nutrition and Dietetics. Reviewed patient's dietary recall. Provided examples on ways to decrease sodium intake in diet. Discouraged intake of processed foods and use of salt shaker. Encouraged fresh fruits and vegetables as well as whole grain sources of carbohydrates to maximize fiber intake.   RD discussed why it is important for patient to adhere to diet recommendations, and emphasized the role of fluids, foods to avoid, and importance of weighing self daily.  Lab Results  Component Value Date   HGBA1C 7.3 (H) 08/06/2018   Also discussed different food groups and their effects on blood sugar, emphasizing carbohydrate-containing foods. Discussed importance of controlled and consistent carbohydrate intake throughout the day. Pt denies any questions regarding nutrition therapy for diabetes and states that "sometimes my sugars go high, and sometimes they go low."  Expect fair compliance.  Body mass index is 28.72 kg/m. Pt meets criteria for overweight based on current BMI.  Current diet order is Carb Modified, patient is consuming approximately 90% of meals at this time. Labs and medications reviewed. No further nutrition interventions warranted at this time. RD contact information provided. If additional  nutrition issues arise, please re-consult RD.   Gaynell Face, MS, RD, LDN Inpatient Clinical Dietitian Pager: [phone] Weekend/After Hours: [phone]

## 2019-01-06 LAB — BASIC METABOLIC PANEL
Anion gap: 10 (ref 5–15)
BUN: 34 mg/dL — ABNORMAL HIGH (ref 8–23)
CO2: 24 mmol/L (ref 22–32)
Calcium: 8.6 mg/dL — ABNORMAL LOW (ref 8.9–10.3)
Chloride: 103 mmol/L (ref 98–111)
Creatinine, Ser: 1.13 mg/dL — ABNORMAL HIGH (ref 0.44–1.00)
GFR calc Af Amer: 54 mL/min — ABNORMAL LOW (ref >60)
GFR calc non Af Amer: 47 mL/min — ABNORMAL LOW (ref >60)
Glucose, Bld: 324 mg/dL — ABNORMAL HIGH (ref 70–99)
Potassium: 4.5 mmol/L (ref 3.5–5.1)
Sodium: 137 mmol/L (ref 135–145)

## 2019-01-06 LAB — CBC
HCT: 30.3 % — ABNORMAL LOW (ref 36.0–46.0)
Hemoglobin: 9.6 g/dL — ABNORMAL LOW (ref 12.0–15.0)
MCH: 26.2 pg (ref 26.0–34.0)
MCHC: 31.7 g/dL (ref 30.0–36.0)
MCV: 82.8 fL (ref 80.0–100.0)
Platelets: 320 10*3/uL (ref 150–400)
RBC: 3.66 MIL/uL — ABNORMAL LOW (ref 3.87–5.11)
RDW: 14.6 % (ref 11.5–15.5)
WBC: 11.6 10*3/uL — ABNORMAL HIGH (ref 4.0–10.5)
nRBC: 0 % (ref 0.0–0.2)

## 2019-01-06 LAB — GLUCOSE, CAPILLARY
Glucose-Capillary: 272 mg/dL — ABNORMAL HIGH (ref 70–99)
Glucose-Capillary: 338 mg/dL — ABNORMAL HIGH (ref 70–99)

## 2019-01-06 MED ORDER — AMLODIPINE BESYLATE 10 MG PO TABS
10.0000 mg | ORAL_TABLET | Freq: Every day | ORAL | Status: DC
  Administered 2019-01-06: 10 mg via ORAL
  Filled 2019-01-06: qty 1

## 2019-01-06 MED ORDER — FUROSEMIDE 40 MG PO TABS: 40.0000 mg | ORAL_TABLET | Freq: Every day | ORAL | 1 refills | Status: DC

## 2019-01-06 MED ORDER — IPRATROPIUM-ALBUTEROL 0.5-2.5 (3) MG/3ML IN SOLN: 3.0000 mL | Freq: Four times a day (QID) | RESPIRATORY_TRACT | Status: DC | PRN

## 2019-01-06 MED ORDER — GUAIFENESIN 100 MG/5ML PO SOLN: 5.0000 mL | ORAL | 0 refills | Status: DC | PRN

## 2019-01-06 MED ORDER — IPRATROPIUM-ALBUTEROL 0.5-2.5 (3) MG/3ML IN SOLN: 3.0000 mL | Freq: Two times a day (BID) | RESPIRATORY_TRACT | Status: DC

## 2019-01-06 NOTE — Plan of Care (Signed)
  Problem: Clinical Measurements: Goal: Respiratory complications will improve Outcome: Adequate for Discharge   Problem: Clinical Measurements: Goal: Cardiovascular complication will be avoided Outcome: Adequate for Discharge   Problem: Education: Goal: Individualized Educational Video(s) Outcome: Adequate for Discharge

## 2019-01-06 NOTE — Progress Notes (Signed)
Patient is being discharge home as per order, discharge order provided, IV removed tele removed , patient discharge home at this time

## 2019-01-06 NOTE — Discharge Summary (Addendum)
[facility] NAME: Brittney [name]    MR#:  [id number]  DATE OF BIRTH:  November 18, 1941  DATE OF ADMISSION:  01/02/2019   ADMITTING PHYSICIAN: Arta Silence, MD  DATE OF DISCHARGE: 01/06/2019  PRIMARY CARE PHYSICIAN: Elisabeth Cara, NP   ADMISSION DIAGNOSIS:  Acute pulmonary edema ([place]) [J81.0] Respiratory distress [R06.03] Hypoxia [R09.02] Acute respiratory failure with hypoxia ([place]) [J96.01] Community acquired pneumonia, unspecified laterality [J18.9] DISCHARGE DIAGNOSIS:  Active Problems:   Acute hypoxemic respiratory failure ([place])  SECONDARY DIAGNOSIS:   Past Medical History:  Diagnosis Date  . Diabetes mellitus without complication ([place])   . GERD (gastroesophageal reflux disease)   . Hyperlipidemia   . Hypertension   . Vitamin D deficiency    HOSPITAL COURSE:   1. Acute hypoxic respiratory failure.  Patient tapered to room air.  This has improved. 2. Shock has resolved.  Patient off Neo-Synephrine. 3. Acute diastolic congestive heart failure.  LV EF: 45% -   50%. Patient has been treated with Lasix IV and changed to p.o.  Per Dr. Ubaldo Glassing, continue Lasix p.o. 40 mg daily, potentially to do a cardiac catheterization as outpatient with Dr. Clayborn Bigness. 4. Metabolic acidosis and lactic acidosis.  Improved. 5. Hyperlipidemia unspecified on atorvastatin 6.  Hypertension.  Resume hypertension medication except losartan. 7.  ARF, improved.  Hold losartan. 8.  Diabetes type 2, continue glipizide and sitagliptin, hold metformin for possible outpatient cardiac cath.  Follow-up with Dr. Clayborn Bigness to resume. 9.  Hypertension.  Resume home hypertension medication but hold losartan due to renal failure.  May resume losartan if renal function is stable.  Follow-up PCP to resume. Generalized weakness.  HH PT. DISCHARGE CONDITIONS:  Stable, discharged to home with home health PT today. CONSULTS OBTAINED:  Treatment Team:  Arta Silence, MD Teodoro Spray, MD DRUG ALLERGIES:   Allergies  Allergen Reactions  . Accupril [Quinapril Hcl]    DISCHARGE MEDICATIONS:   Allergies as of 01/06/2019      Reactions   Accupril [quinapril Hcl]       Medication List    STOP taking these medications   guaiFENesin-dextromethorphan 100-10 MG/5ML syrup Commonly known as:  ROBITUSSIN DM   losartan 100 MG tablet Commonly known as:  COZAAR   metFORMIN 1000 MG tablet Commonly known as:  GLUCOPHAGE     TAKE these medications   amLODipine 10 MG tablet Commonly known as:  NORVASC Take 10 mg by mouth daily.   aspirin 81 MG tablet Take 81 mg by mouth daily.   atorvastatin 40 MG tablet Commonly known as:  LIPITOR Take 1 tablet by mouth daily.   cetirizine 10 MG tablet Commonly known as:  ZYRTEC Take 10 mg by mouth daily.   Choline Fenofibrate 45 MG capsule Take 90 mg by mouth daily.   furosemide 40 MG tablet Commonly known as:  LASIX Take 1 tablet (40 mg total) by mouth daily. Start taking on:  January 07, 2019   glipiZIDE 10 MG 24 hr tablet Commonly known as:  GLUCOTROL XL Take 10 mg by mouth 2 (two) times daily.   guaiFENesin 100 MG/5ML Soln Commonly known as:  ROBITUSSIN Take 5 mLs (100 mg total) by mouth every 4 (four) hours as needed for cough or to loosen phlegm.   metoprolol tartrate 50 MG tablet Commonly known as:  LOPRESSOR Take 1 tablet (50 mg total) by mouth 2 (two) times daily.   omeprazole 20 MG capsule Commonly known as:  PRILOSEC Take 20 mg by mouth daily.   OZEMPIC (0.25 OR 0.5 MG/DOSE)  Inject 0.25 mg into the skin once a week. For 4 weeks   ranitidine 150 MG tablet Commonly known as:  ZANTAC Take 1 tablet by mouth 2 (two) times daily.   sitaGLIPtin 100 MG tablet Commonly known as:  JANUVIA Take 100 mg by mouth daily.   VICTOZA 18 MG/3ML Sopn Generic drug:  liraglutide Inject 1.8 mg into the skin every morning.        DISCHARGE INSTRUCTIONS:  See AVS. If you  experience worsening of your admission symptoms, develop shortness of breath, life threatening emergency, suicidal or homicidal thoughts you must seek medical attention immediately by calling 911 or calling your MD immediately  if symptoms less severe.  You Must read complete instructions/literature along with all the possible adverse reactions/side effects for all the Medicines you take and that have been prescribed to you. Take any new Medicines after you have completely understood and accpet all the possible adverse reactions/side effects.   Please note  You were cared for by a hospitalist during your hospital stay. If you have any questions about your discharge medications or the care you received while you were in the hospital after you are discharged, you can call the unit and asked to speak with the hospitalist on call if the hospitalist that took care of you is not available. Once you are discharged, your primary care physician will handle any further medical issues. Please note that NO REFILLS for any discharge medications will be authorized once you are discharged, as it is imperative that you return to your primary care physician (or establish a relationship with a primary care physician if you do not have one) for your aftercare needs so that they can reassess your need for medications and monitor your lab values.    On the day of Discharge:  VITAL SIGNS:  Blood pressure (!) 165/70, pulse 82, temperature 98.2 F (36.8 C), temperature source Oral, resp. rate 18, height 5\' 6"  (1.676 m), weight 76.9 kg, SpO2 99 %. PHYSICAL EXAMINATION:  GENERAL:  78 y.o.-year-old patient lying in the bed with no acute distress.  EYES: Pupils equal, round, reactive to light and accommodation. No scleral icterus. Extraocular muscles intact.  HEENT: Head atraumatic, normocephalic. Oropharynx and nasopharynx clear.  NECK:  Supple, no jugular venous distention. No thyroid enlargement, no tenderness.  LUNGS:  Normal breath sounds bilaterally, no wheezing, rales,rhonchi or crepitation. No use of accessory muscles of respiration.  CARDIOVASCULAR: S1, S2 normal. No murmurs, rubs, or gallops.  ABDOMEN: Soft, non-tender, non-distended. Bowel sounds present. No organomegaly or mass.  EXTREMITIES: No pedal edema, cyanosis, or clubbing.  NEUROLOGIC: Cranial nerves II through XII are intact. Muscle strength 5/5 in all extremities. Sensation intact. Gait not checked.  PSYCHIATRIC: The patient is alert and oriented x 3.  SKIN: No obvious rash, lesion, or ulcer.  DATA REVIEW:   CBC Recent Labs  Lab 01/06/19 0602  WBC 11.6*  HGB 9.6*  HCT 30.3*  PLT 320    Chemistries  Recent Labs  Lab 01/02/19 0432  01/04/19 0553  01/06/19 0602  NA 139   < > 140   < > 137  K 3.9   < > 4.4   < > 4.5  CL 105   < > 109   < > 103  CO2 22   < > 23   < > 24  GLUCOSE 271*   < > 233*   < >  324*  BUN 17   < > 41*   < > 34*  CREATININE 0.97   < > 1.56*   < > 1.13*  CALCIUM 9.0   < > 8.2*   < > 8.6*  MG  --    < > 2.3  --   --   AST 19  --   --   --   --   ALT 14  --   --   --   --   ALKPHOS 55  --   --   --   --   BILITOT 0.8  --   --   --   --    < > = values in this interval not displayed.     Microbiology Results  Results for orders placed or performed during the hospital encounter of 01/02/19  Blood Culture (routine x 2)     Status: None (Preliminary result)   Collection Time: 01/02/19  4:32 AM  Result Value Ref Range Status   Specimen Description BLOOD RIGHT AC  Final   Special Requests   Final    BOTTLES DRAWN AEROBIC AND ANAEROBIC Blood Culture results may not be optimal due to an excessive volume of blood received in culture bottles   Culture   Final    NO GROWTH 4 DAYS Performed at [facility], [address], [place], [place] [address]    Report Status PENDING  Incomplete  Urine culture     Status: None   Collection Time: 01/02/19  6:07 AM  Result Value Ref Range Status   Specimen  Description   Final    URINE, RANDOM Performed at [facility], [address], [place], [place] [address]    Special Requests   Final    NONE Performed at [facility], [address] [place], [place] [address]    Culture   Final    NO GROWTH Performed at [facility], [place] [address] [place], [place] [address]    Report Status 01/03/2019 FINAL  Final  MRSA PCR Screening     Status: None   Collection Time: 01/02/19  9:23 AM  Result Value Ref Range Status   MRSA by PCR NEGATIVE NEGATIVE Final    Comment:        The GeneXpert MRSA Assay (FDA approved for NASAL specimens only), is one component of a comprehensive MRSA colonization surveillance program. It is not intended to diagnose MRSA infection nor to guide or monitor treatment for MRSA infections. Performed at [facility], [address] [place], [place] [address]   Blood Culture (routine x 2)     Status: None (Preliminary result)   Collection Time: 01/02/19 10:44 AM  Result Value Ref Range Status   Specimen Description BLOOD BLOOD RIGHT HAND  Final   Special Requests   Final    BOTTLES DRAWN AEROBIC ONLY Blood Culture adequate volume   Culture   Final    NO GROWTH 4 DAYS Performed at [facility], [address], [place], [place] [address]    Report Status PENDING  Incomplete  Culture, respiratory     Status: None   Collection Time: 01/02/19  4:05 PM  Result Value Ref Range Status   Specimen Description   Final    SPUTUM Performed at [facility], [address], [place],  [address]    Special Requests   Final    NONE Performed at [facility], [place]  [address] [place], [place] [address]    Gram Stain   Final    MODERATE WBC PRESENT, PREDOMINANTLY MONONUCLEAR NO ORGANISMS SEEN    Culture   Final    RARE Consistent with normal respiratory flora. Performed at [facility], [place] [address], [place], [place] [address]    Report  Status 01/05/2019 FINAL  Final    RADIOLOGY:  No results found.   Management plans discussed with the patient, her daughter and they are in agreement.  CODE STATUS: Full Code   TOTAL TIME TAKING CARE OF THIS PATIENT: 35 minutes.    [name] M.D on 01/06/2019 at 1:32 PM  Between 7am to 6pm - Pager - [phone]  After 6pm go to [email or web address] - Proofreader  [facility]  Office  [phone]  CC: Primary care physician; Elisabeth Cara, NP   Note: This dictation was prepared with Dragon dictation along with smaller phrase technology. Any transcriptional errors that result from this process are unintentional.

## 2019-01-06 NOTE — Care Management Note (Signed)
Case Management Note  Patient Details  Name: Brittney [name] MRN: [id number] Date of Birth: [birth year]  Subjective/Objective:  Patient to be discharged per MD order. Orders in place for home health services. Previous RNCM had began workup with [facility]. Referral already received by [facility]. No DME needs. Family to transport.                   Action/Plan:   Expected Discharge Date:  01/06/19               Expected Discharge Plan:  [place]  In-House Referral:     Discharge planning Services  CM Consult  Post Acute Care Choice:  Home Health Choice offered to:     DME Arranged:    DME Agency:     HH Arranged:  RN, PT [place] Agency:  [facility]  Status of Service:  Completed, signed off  If discussed at [place] of Stay Meetings, dates discussed:    Additional Comments:  Latanya Maudlin, RN 01/06/2019, 2:09 PM

## 2019-01-07 LAB — CULTURE, BLOOD (ROUTINE X 2)
Culture: NO GROWTH
Culture: NO GROWTH
Special Requests: ADEQUATE

## 2019-01-17 ENCOUNTER — Ambulatory Visit: Payer: Medicare HMO | Attending: Family | Admitting: Family

## 2019-01-17 ENCOUNTER — Encounter: Payer: Self-pay | Admitting: Family

## 2019-01-17 DIAGNOSIS — Z7984 Long term (current) use of oral hypoglycemic drugs: Secondary | ICD-10-CM | POA: Diagnosis not present

## 2019-01-17 DIAGNOSIS — E1122 Type 2 diabetes mellitus with diabetic chronic kidney disease: Secondary | ICD-10-CM

## 2019-01-17 DIAGNOSIS — Z87891 Personal history of nicotine dependence: Secondary | ICD-10-CM | POA: Diagnosis not present

## 2019-01-17 DIAGNOSIS — I130 Hypertensive heart and chronic kidney disease with heart failure and stage 1 through stage 4 chronic kidney disease, or unspecified chronic kidney disease: Secondary | ICD-10-CM | POA: Diagnosis not present

## 2019-01-17 DIAGNOSIS — Z79899 Other long term (current) drug therapy: Secondary | ICD-10-CM | POA: Diagnosis not present

## 2019-01-17 DIAGNOSIS — N189 Chronic kidney disease, unspecified: Secondary | ICD-10-CM | POA: Insufficient documentation

## 2019-01-17 DIAGNOSIS — I5022 Chronic systolic (congestive) heart failure: Secondary | ICD-10-CM

## 2019-01-17 DIAGNOSIS — K219 Gastro-esophageal reflux disease without esophagitis: Secondary | ICD-10-CM | POA: Insufficient documentation

## 2019-01-17 DIAGNOSIS — E559 Vitamin D deficiency, unspecified: Secondary | ICD-10-CM | POA: Diagnosis not present

## 2019-01-17 DIAGNOSIS — I10 Essential (primary) hypertension: Secondary | ICD-10-CM

## 2019-01-17 DIAGNOSIS — Z7982 Long term (current) use of aspirin: Secondary | ICD-10-CM | POA: Insufficient documentation

## 2019-01-17 DIAGNOSIS — E785 Hyperlipidemia, unspecified: Secondary | ICD-10-CM | POA: Insufficient documentation

## 2019-01-17 DIAGNOSIS — E119 Type 2 diabetes mellitus without complications: Secondary | ICD-10-CM | POA: Insufficient documentation

## 2019-01-17 DIAGNOSIS — N182 Chronic kidney disease, stage 2 (mild): Secondary | ICD-10-CM

## 2019-01-17 NOTE — Patient Instructions (Signed)
Continue weighing daily and call for an overnight weight gain of > 2 pounds or a weekly weight gain of >5 pounds. 

## 2019-01-17 NOTE — Progress Notes (Signed)
Patient ID: Brittney [name], female    DOB: 1941-04-09, 78 y.o.   MRN: [id number]  HPI  Brittney [name] is a 78 y/o female with a history of DM, hyperlipidemia, HTN, CKD, GERD, vitamin D deficiency, previous tobacco use and chronic heart failure.   Echo report from 01/02/2019 reviewed and showed an EF of 45-50%.  Admitted 01/02/2019 due to acute hypoxic respiratory failure and acute HF. Cardiology consult obtained. Initially needed oxygen and then transitioned to room air. Needed neo-synephrine for shock. Initially given IV lasix and then transitioned to oral diuretics. Discharged after 4 days.   She presents today for her initial visit with a chief complaint of minimal shortness of breath upon moderate exertion. She describes this as chronic in nature having been present for several months. She has associated fatigue along with this. She denies any dizziness, difficulty sleeping, abdominal distention, palpitations, pedal edema, chest pain, cough or weight gain. Patient says that there is discussion about doing catheterization in the future.   Past Medical History:  Diagnosis Date  . CHF (congestive heart failure) ([place])   . Chronic kidney disease   . Diabetes mellitus without complication ([place])   . GERD (gastroesophageal reflux disease)   . Hyperlipidemia   . Hypertension   . Vitamin D deficiency    Past Surgical History:  Procedure Laterality Date  . COLONOSCOPY     Family History  Problem Relation Age of Onset  . Diabetes Neg Hx   . Hypertension Neg Hx    Social History   Tobacco Use  . Smoking status: Former Smoker    Packs/day: 1.00    Years: 40.00    Pack years: 40.00    Types: Cigarettes    Last attempt to quit: 07/27/2018    Years since quitting: 0.4  . Smokeless tobacco: Never Used  Substance Use Topics  . Alcohol use: No   Allergies  Allergen Reactions  . Accupril [Quinapril Hcl]    Prior to Admission medications   Medication Sig Start Date End Date Taking?  Authorizing Provider  amLODipine (NORVASC) 10 MG tablet Take 10 mg by mouth daily.   Yes [provider]  aspirin 81 MG tablet Take 81 mg by mouth daily.   Yes [provider]  atorvastatin (LIPITOR) 40 MG tablet Take 1 tablet by mouth daily. 07/04/18  Yes [provider]  cetirizine (ZYRTEC) 10 MG tablet Take 10 mg by mouth daily.    Yes [provider]  Choline Fenofibrate 45 MG capsule Take 90 mg by mouth daily.   Yes [provider]  furosemide (LASIX) 40 MG tablet Take 1 tablet (40 mg total) by mouth daily. 01/07/19  Yes [name], MD  glipiZIDE (GLUCOTROL XL) 10 MG 24 hr tablet Take 10 mg by mouth 2 (two) times daily.   Yes [provider]  guaiFENesin (ROBITUSSIN) 100 MG/5ML SOLN Take 5 mLs (100 mg total) by mouth every 4 (four) hours as needed for cough or to loosen phlegm. 01/06/19  Yes [name], MD  Liraglutide (VICTOZA) 18 MG/3ML SOPN Inject 1.8 mg into the skin every morning.   Yes [provider]  metoprolol tartrate (LOPRESSOR) 50 MG tablet Take 1 tablet (50 mg total) by mouth 2 (two) times daily. 08/11/18 01/17/19 Yes Sainani, Belia Heman, MD  omeprazole (PRILOSEC) 20 MG capsule Take 20 mg by mouth daily.   Yes [provider]  ranitidine (ZANTAC) 150 MG tablet Take 1 tablet by mouth 2 (two) times daily.  05/06/18  Yes [provider]  Semaglutide (OZEMPIC, 0.25 OR 0.5 MG/DOSE, [place]) Inject 0.25 mg into the skin once a week. For 4 weeks   Yes [provider]  sitaGLIPtin (JANUVIA) 100 MG tablet Take 100 mg by mouth daily.   Yes [provider]   Review of Systems  Constitutional: Positive for fatigue. Negative for appetite change.  HENT: Positive for sneezing. Negative for congestion, rhinorrhea and sore throat.   Eyes: Negative.   Respiratory: Positive for shortness of breath (with moderate exertion). Negative for cough.   Cardiovascular: Negative for chest pain, palpitations and leg swelling.   Gastrointestinal: Negative for abdominal distention and abdominal pain.  Endocrine: Negative.   Genitourinary: Negative.   Musculoskeletal: Negative for back pain and neck pain.  Skin: Negative.   Allergic/Immunologic: Negative.   Neurological: Negative for dizziness and light-headedness.  Hematological: Negative for adenopathy. Does not bruise/bleed easily.  Psychiatric/Behavioral: Negative for dysphoric mood and sleep disturbance (sleeping on 1 pillow). The patient is not nervous/anxious.    Vitals:   01/17/19 1348  BP: (!) 142/63  Pulse: 88  Resp: 18  SpO2: 99%  Weight: 165 lb (74.8 kg)  Height: 5\' 5"  (1.651 m)   Wt Readings from Last 3 Encounters:  01/17/19 165 lb (74.8 kg)  01/06/19 169 lb 8 oz (76.9 kg)  08/25/18 169 lb 5 oz (76.8 kg)   Lab Results  Component Value Date   CREATININE 1.13 (H) 01/06/2019   CREATININE 1.51 (H) 01/05/2019   CREATININE 1.56 (H) 01/04/2019    Physical Exam Vitals signs and nursing note reviewed.  Constitutional:      Appearance: Normal appearance.  HENT:     Head: Normocephalic and atraumatic.  Neck:     Musculoskeletal: Normal range of motion and neck supple.  Cardiovascular:     Rate and Rhythm: Normal rate and regular rhythm.  Pulmonary:     Effort: Pulmonary effort is normal.     Breath sounds: No wheezing or rales.  Abdominal:     General: There is no distension.     Palpations: Abdomen is soft.  Musculoskeletal:        General: No tenderness.     Right lower leg: No edema.     Left lower leg: No edema.  Skin:    General: Skin is warm and dry.  Neurological:     General: No focal deficit present.     Mental Status: She is alert and oriented to person, place, and time.  Psychiatric:        Mood and Affect: Mood normal.        Behavior: Behavior normal.        Thought Content: Thought content normal.   Assessment & Plan:  1: Chronic heart failure with mildly reduced ejection fraction- - NYHA class II - euvolemic  today - weighing daily and she was instructed to call for an overnight weight gain of >2 pounds or a weekly weight gain of >5 pounds - not adding salt to her food and reviewed the importance of closely following a 2000mg  sodium diet and written dietary information was given to her about this. She is already rinsing her canned foods prior to cooking. - consider changing her metoprolol tartrate to metoprolol succinate - saw cardiology Clayborn Bigness) 10/16/18 & returns tomorrow - BNP 01/02/2019 was 258.0 - patient says that she's received her flu vaccine - she says that she hasn't smoked since August 2019 - discussion of outpatient catheterization depending on  renal function  2: HTN- - BP looks good today - follows with PCP Frederico Hamman) - BMP from 01/06/2019 reviewed and showed sodium 137, potassium 4.5, creatinine 1.13 and GFR 54  3: Diabetes- - glucose at home yesterday evening was 196 - A1c 08/06/18 was 7.3%  Patient did not bring her medications nor a list. Each medication was verbally reviewed with the patient and she was encouraged to bring the bottles to every visit to confirm accuracy of list.  Return in 6 weeks or sooner for any questions/problems before then.

## 2019-01-23 ENCOUNTER — Other Ambulatory Visit: Payer: Self-pay

## 2019-01-23 ENCOUNTER — Ambulatory Visit
Admission: RE | Admit: 2019-01-23 | Discharge: 2019-01-23 | Disposition: A | Discharge status: Home / Self Care | Payer: Medicare HMO | Attending: Internal Medicine | Admitting: Internal Medicine

## 2019-01-23 ENCOUNTER — Encounter: Payer: Self-pay | Admitting: *Deleted

## 2019-01-23 ENCOUNTER — Encounter
Admission: RE | Disposition: A | Discharge status: Home / Self Care | Payer: Self-pay | Source: Home / Self Care | Attending: Internal Medicine

## 2019-01-23 DIAGNOSIS — Z833 Family history of diabetes mellitus: Secondary | ICD-10-CM | POA: Diagnosis not present

## 2019-01-23 DIAGNOSIS — Z7982 Long term (current) use of aspirin: Secondary | ICD-10-CM | POA: Insufficient documentation

## 2019-01-23 DIAGNOSIS — Z79899 Other long term (current) drug therapy: Secondary | ICD-10-CM | POA: Diagnosis not present

## 2019-01-23 DIAGNOSIS — F17200 Nicotine dependence, unspecified, uncomplicated: Secondary | ICD-10-CM | POA: Insufficient documentation

## 2019-01-23 DIAGNOSIS — Z801 Family history of malignant neoplasm of trachea, bronchus and lung: Secondary | ICD-10-CM | POA: Insufficient documentation

## 2019-01-23 DIAGNOSIS — E785 Hyperlipidemia, unspecified: Secondary | ICD-10-CM | POA: Diagnosis not present

## 2019-01-23 DIAGNOSIS — I2510 Atherosclerotic heart disease of native coronary artery without angina pectoris: Secondary | ICD-10-CM | POA: Insufficient documentation

## 2019-01-23 DIAGNOSIS — K219 Gastro-esophageal reflux disease without esophagitis: Secondary | ICD-10-CM | POA: Insufficient documentation

## 2019-01-23 DIAGNOSIS — Z7984 Long term (current) use of oral hypoglycemic drugs: Secondary | ICD-10-CM | POA: Diagnosis not present

## 2019-01-23 DIAGNOSIS — I350 Nonrheumatic aortic (valve) stenosis: Secondary | ICD-10-CM | POA: Diagnosis present

## 2019-01-23 DIAGNOSIS — Z888 Allergy status to other drugs, medicaments and biological substances status: Secondary | ICD-10-CM | POA: Diagnosis not present

## 2019-01-23 DIAGNOSIS — Z8249 Family history of ischemic heart disease and other diseases of the circulatory system: Secondary | ICD-10-CM | POA: Insufficient documentation

## 2019-01-23 DIAGNOSIS — E559 Vitamin D deficiency, unspecified: Secondary | ICD-10-CM | POA: Diagnosis not present

## 2019-01-23 DIAGNOSIS — I10 Essential (primary) hypertension: Secondary | ICD-10-CM | POA: Diagnosis not present

## 2019-01-23 DIAGNOSIS — Z803 Family history of malignant neoplasm of breast: Secondary | ICD-10-CM | POA: Diagnosis not present

## 2019-01-23 DIAGNOSIS — E118 Type 2 diabetes mellitus with unspecified complications: Secondary | ICD-10-CM | POA: Diagnosis not present

## 2019-01-23 DIAGNOSIS — R0602 Shortness of breath: Secondary | ICD-10-CM | POA: Diagnosis not present

## 2019-01-23 DIAGNOSIS — Z8042 Family history of malignant neoplasm of prostate: Secondary | ICD-10-CM | POA: Diagnosis not present

## 2019-01-23 HISTORY — DX: Other specified symptoms and signs involving the circulatory and respiratory systems: R09.89

## 2019-01-23 HISTORY — DX: Cardiac murmur, unspecified: R01.1

## 2019-01-23 HISTORY — DX: Nonrheumatic aortic (valve) stenosis: I35.0

## 2019-01-23 HISTORY — DX: Pneumonia, unspecified organism: J18.9

## 2019-01-23 HISTORY — PX: LEFT HEART CATH AND CORONARY ANGIOGRAPHY: CATH118249

## 2019-01-23 LAB — GLUCOSE, CAPILLARY
Glucose-Capillary: 420 mg/dL — ABNORMAL HIGH (ref 70–99)
Glucose-Capillary: 440 mg/dL — ABNORMAL HIGH (ref 70–99)
Glucose-Capillary: 257 mg/dL — ABNORMAL HIGH (ref 70–99)

## 2019-01-23 LAB — CARDIAC CATHETERIZATION: Cath EF Quantitative: 65 %

## 2019-01-23 SURGERY — LEFT HEART CATH AND CORONARY ANGIOGRAPHY
Anesthesia: Moderate Sedation

## 2019-01-23 MED ORDER — SODIUM CHLORIDE 0.9 % WEIGHT BASED INFUSION
3.0000 mL/kg/h | INTRAVENOUS | Status: AC
  Administered 2019-01-23: 3 mL/kg/h via INTRAVENOUS

## 2019-01-23 MED ORDER — ACETAMINOPHEN 325 MG PO TABS: 650.0000 mg | ORAL_TABLET | ORAL | Status: DC | PRN

## 2019-01-23 MED ORDER — VERAPAMIL HCL 2.5 MG/ML IV SOLN
INTRAVENOUS | Status: DC | PRN
  Administered 2019-01-23: 2.5 mg via INTRA_ARTERIAL

## 2019-01-23 MED ORDER — SODIUM CHLORIDE 0.9% FLUSH: 3.0000 mL | Freq: Two times a day (BID) | INTRAVENOUS | Status: DC

## 2019-01-23 MED ORDER — FENTANYL CITRATE (PF) 100 MCG/2ML IJ SOLN
INTRAMUSCULAR | Status: DC | PRN
  Administered 2019-01-23 (×3): 25 ug via INTRAVENOUS

## 2019-01-23 MED ORDER — HEPARIN (PORCINE) IN NACL 1000-0.9 UT/500ML-% IV SOLN
INTRAVENOUS | Status: DC | PRN
  Administered 2019-01-23: 500 mL

## 2019-01-23 MED ORDER — SODIUM CHLORIDE 0.9 % WEIGHT BASED INFUSION: 1.0000 mL/kg/h | INTRAVENOUS | Status: DC

## 2019-01-23 MED ORDER — SODIUM CHLORIDE 0.9% FLUSH: 3.0000 mL | INTRAVENOUS | Status: DC | PRN

## 2019-01-23 MED ORDER — HEPARIN SODIUM (PORCINE) 1000 UNIT/ML IJ SOLN
INTRAMUSCULAR | Status: AC
  Filled 2019-01-23: qty 1

## 2019-01-23 MED ORDER — ISOSORBIDE MONONITRATE ER 30 MG PO TB24
30.0000 mg | ORAL_TABLET | Freq: Once | ORAL | Status: AC
  Administered 2019-01-23: 30 mg via ORAL
  Filled 2019-01-23: qty 1

## 2019-01-23 MED ORDER — SODIUM CHLORIDE 0.9 % IV SOLN: 250.0000 mL | INTRAVENOUS | Status: DC | PRN

## 2019-01-23 MED ORDER — ONDANSETRON HCL 4 MG/2ML IJ SOLN: 4.0000 mg | Freq: Four times a day (QID) | INTRAMUSCULAR | Status: DC | PRN

## 2019-01-23 MED ORDER — MIDAZOLAM HCL 2 MG/2ML IJ SOLN
INTRAMUSCULAR | Status: DC | PRN
  Administered 2019-01-23 (×2): 0.5 mg via INTRAVENOUS
  Administered 2019-01-23: 1 mg via INTRAVENOUS

## 2019-01-23 MED ORDER — ASPIRIN 81 MG PO CHEW: 81.0000 mg | CHEWABLE_TABLET | Freq: Every day | ORAL | Status: DC

## 2019-01-23 MED ORDER — MIDAZOLAM HCL 2 MG/2ML IJ SOLN
INTRAMUSCULAR | Status: AC
  Filled 2019-01-23: qty 2

## 2019-01-23 MED ORDER — INSULIN ASPART 100 UNIT/ML ~~LOC~~ SOLN
6.0000 [IU] | Freq: Once | SUBCUTANEOUS | Status: AC
  Administered 2019-01-23: 6 [IU] via SUBCUTANEOUS

## 2019-01-23 MED ORDER — INSULIN ASPART 100 UNIT/ML ~~LOC~~ SOLN
SUBCUTANEOUS | Status: AC
  Filled 2019-01-23: qty 1

## 2019-01-23 MED ORDER — VERAPAMIL HCL 2.5 MG/ML IV SOLN
INTRAVENOUS | Status: AC
  Filled 2019-01-23: qty 2

## 2019-01-23 MED ORDER — HEPARIN (PORCINE) IN NACL 1000-0.9 UT/500ML-% IV SOLN
INTRAVENOUS | Status: AC
  Filled 2019-01-23: qty 1000

## 2019-01-23 MED ORDER — FENTANYL CITRATE (PF) 100 MCG/2ML IJ SOLN
INTRAMUSCULAR | Status: AC
  Filled 2019-01-23: qty 2

## 2019-01-23 MED ORDER — ASPIRIN 81 MG PO CHEW: 81.0000 mg | CHEWABLE_TABLET | ORAL | Status: DC

## 2019-01-23 MED ORDER — HEPARIN SODIUM (PORCINE) 1000 UNIT/ML IJ SOLN
INTRAMUSCULAR | Status: DC | PRN
  Administered 2019-01-23: 3500 [IU] via INTRAVENOUS

## 2019-01-23 SURGICAL SUPPLY — 15 items
CATH INFINITI 5 FR JL3.5 (CATHETERS) ×3 IMPLANT
CATH INFINITI 5FR JL4 (CATHETERS) ×3 IMPLANT
CATH INFINITI JR4 5F (CATHETERS) ×3 IMPLANT
CATH SWANZ 7F THERMO (CATHETERS) IMPLANT
DEVICE RAD TR BAND REGULAR (VASCULAR PRODUCTS) ×3 IMPLANT
GLIDESHEATH SLEND SS 6F .021 (SHEATH) ×3 IMPLANT
KIT MANI 3VAL PERCEP (MISCELLANEOUS) ×3 IMPLANT
KIT RIGHT HEART (MISCELLANEOUS) IMPLANT
NEEDLE PERC 18GX7CM (NEEDLE) IMPLANT
PACK CARDIAC CATH (CUSTOM PROCEDURE TRAY) ×3 IMPLANT
SHEATH AVANTI 6FR X 11CM (SHEATH) IMPLANT
SHEATH AVANTI 7FRX11 (SHEATH) IMPLANT
WIRE GUIDERIGHT .035X150 (WIRE) IMPLANT
WIRE HITORQ VERSACORE ST 145CM (WIRE) ×3 IMPLANT
WIRE ROSEN-J .035X260CM (WIRE) ×3 IMPLANT

## 2019-01-23 NOTE — Discharge Instructions (Signed)
Groin Insertion Instructions-If you lose feeling or develop tingling or pain in your leg or foot after the procedure, please walk around first.  If the discomfort does not improve , contact your physician and proceed to the nearest emergency room.  Loss of feeling in your leg might mean that a blockage has formed in the artery and this can be appropriately treated.  Limit your activity for the next two days after your procedure.  Avoid stooping, bending, heavy lifting or exertion as this may put pressure on the insertion site.  Resume normal activities in 48 hours.  You may shower after 24 hours but avoid excessive warm water and do not scrub the site.  Remove clear dressing in 48 hours.  If you have had a closure device inserted, do not soak in a tub bath or a hot tub for at least one week.  No driving for 48 hours after discharge.  After the procedure, check the insertion site occasionally.  If any oozing occurs or there is apparent swelling, firm pressure over the site will prevent a bruise from forming.  You can not hurt anything by pressing directly on the site.  The pressure stops the bleeding by allowing a small clot to form.  If the bleeding continues after the pressure has been applied for more than 15 minutes, call 911 or go to the nearest emergency room.    The x-ray dye causes you to pass a considerate amount of urine.  For this reason, you will be asked to drink plenty of liquids after the procedure to prevent dehydration.  You may resume you regular diet.  Avoid caffeine products.    For pain at the site of your procedure, take non-aspirin medicines such as Tylenol.  Medications: A. Hold Metformin for 48 hours if applicable.  B. Continue taking all your present medications at home unless your doctor prescribes any changes.  Moderate Conscious Sedation, Adult, Care After These instructions provide you with information about caring for yourself after your procedure. Your health care provider  may also give you more specific instructions. Your treatment has been planned according to current medical practices, but problems sometimes occur. Call your health care provider if you have any problems or questions after your procedure. What can I expect after the procedure? After your procedure, it is common:  To feel sleepy for several hours.  To feel clumsy and have poor balance for several hours.  To have poor judgment for several hours.  To vomit if you eat too soon. Follow these instructions at home: For at least 24 hours after the procedure:   Do not: ? Participate in activities where you could fall or become injured. ? Drive. ? Use heavy machinery. ? Drink alcohol. ? Take sleeping pills or medicines that cause drowsiness. ? Make important decisions or sign legal documents. ? Take care of children on your own.  Rest. Eating and drinking  Follow the diet recommended by your health care provider.  If you vomit: ? Drink water, juice, or soup when you can drink without vomiting. ? Make sure you have little or no nausea before eating solid foods. General instructions  Have a responsible adult stay with you until you are awake and alert.  Take over-the-counter and prescription medicines only as told by your health care provider.  If you smoke, do not smoke without supervision.  Keep all follow-up visits as told by your health care provider. This is important. Contact a health care provider if:  You keep feeling nauseous or you keep vomiting.  You feel light-headed.  You develop a rash.  You have a fever. Get help right away if:  You have trouble breathing. This information is not intended to replace advice given to you by your health care provider. Make sure you discuss any questions you have with your health care provider. Document Released: 10/03/2013 Document Revised: 05/17/2016 Document Reviewed: 04/03/2016 Elsevier Interactive Patient Education  2019  [place] After This sheet gives you information about how to care for yourself after your procedure. Your health care provider may also give you more specific instructions. If you have problems or questions, contact your health care provider. What can I expect after the procedure? After the procedure, it is common to have bruising and tenderness at the catheter insertion area. Follow these instructions at home: Insertion site care  Follow instructions from your health care provider about how to take care of your insertion site. Make sure you: ? Wash your hands with soap and water before you change your bandage (dressing). If soap and water are not available, use hand sanitizer. ? Change your dressing as told by your health care provider. ? Leave stitches (sutures), skin glue, or adhesive strips in place. These skin closures may need to stay in place for 2 weeks or longer. If adhesive strip edges start to loosen and curl up, you may trim the loose edges. Do not remove adhesive strips completely unless your health care provider tells you to do that.  Do not take baths, swim, or use a hot tub until your health care provider approves.  You may shower 24-48 hours after the procedure or as told by your health care provider. ? Gently wash the site with plain soap and water. ? Pat the area dry with a clean towel. ? Do not rub the site. This may cause bleeding.  Do not apply powder or lotion to the site. Keep the site clean and dry.  Check your insertion site every day for signs of infection. Check for: ? Redness, swelling, or pain. ? Fluid or blood. ? Warmth. ? Pus or a bad smell. Activity  Rest as told by your health care provider, usually for 1-2 days.  Do not lift anything that is heavier than 10 lbs. (4.5 kg) or as told by your health care provider.  Do not drive for 24 hours if you were given a medicine to help you relax (sedative).  Do not drive or use heavy  machinery while taking prescription pain medicine. General instructions   Return to your normal activities as told by your health care provider, usually in about a week. Ask your health care provider what activities are safe for you.  If the catheter site starts bleeding, lie flat and put pressure on the site. If the bleeding does not stop, get help right away. This is a medical emergency.  Drink enough fluid to keep your urine clear or pale yellow. This helps flush the contrast dye from your body.  Take over-the-counter and prescription medicines only as told by your health care provider.  Keep all follow-up visits as told by your health care provider. This is important. Contact a health care provider if:  You have a fever or chills.  You have redness, swelling, or pain around your insertion site.  You have fluid or blood coming from your insertion site.  The insertion site feels warm to the touch.  You have pus or a bad  smell coming from your insertion site.  You have bruising around the insertion site.  You notice blood collecting in the tissue around the catheter site (hematoma). The hematoma may be painful to the touch. Get help right away if:  You have severe pain at the catheter insertion area.  The catheter insertion area swells very fast.  The catheter insertion area is bleeding, and the bleeding does not stop when you hold steady pressure on the area.  The area near or just beyond the catheter insertion site becomes pale, cool, tingly, or numb. These symptoms may represent a serious problem that is an emergency. Do not wait to see if the symptoms will go away. Get medical help right away. Call your local emergency services (911 in the [place] Do not drive yourself to the hospital. Summary  After the procedure, it is common to have bruising and tenderness at the catheter insertion area.  After the procedure, it is important to rest and drink plenty of fluids.  Do  not take baths, swim, or use a hot tub until your health care provider says it is okay to do so. You may shower 24-48 hours after the procedure or as told by your health care provider.  If the catheter site starts bleeding, lie flat and put pressure on the site. If the bleeding does not stop, get help right away. This is a medical emergency. This information is not intended to replace advice given to you by your health care provider. Make sure you discuss any questions you have with your health care provider. Document Released: 07/01/2005 Document Revised: 11/17/2016 Document Reviewed: 11/17/2016 Elsevier Interactive Patient Education  2019 [employer].

## 2019-01-23 NOTE — OR Nursing (Signed)
Pt and daughter instructed that Dr Clayborn Bigness to call prescription of Imdur into pharmacy. First dose given today prior to discharge. To take daily.Both acknowledge instructions

## 2019-01-24 ENCOUNTER — Encounter: Payer: Self-pay | Admitting: Internal Medicine

## 2019-02-07 ENCOUNTER — Other Ambulatory Visit: Payer: Self-pay | Admitting: Family Medicine

## 2019-02-07 DIAGNOSIS — Z13820 Encounter for screening for osteoporosis: Secondary | ICD-10-CM

## 2019-02-12 ENCOUNTER — Other Ambulatory Visit: Payer: Self-pay | Admitting: Family Medicine

## 2019-02-12 DIAGNOSIS — Z1231 Encounter for screening mammogram for malignant neoplasm of breast: Secondary | ICD-10-CM

## 2019-02-26 ENCOUNTER — Encounter: Payer: Self-pay | Admitting: Pharmacist

## 2019-02-26 ENCOUNTER — Ambulatory Visit: Payer: Medicare HMO | Attending: Family | Admitting: Family

## 2019-02-26 ENCOUNTER — Encounter: Payer: Self-pay | Admitting: Family

## 2019-02-26 VITALS — BP 169/64 | HR 88 | Resp 18 | Ht 66.0 in | Wt 168.0 lb

## 2019-02-26 DIAGNOSIS — Z7984 Long term (current) use of oral hypoglycemic drugs: Secondary | ICD-10-CM | POA: Diagnosis not present

## 2019-02-26 DIAGNOSIS — Z7982 Long term (current) use of aspirin: Secondary | ICD-10-CM | POA: Diagnosis not present

## 2019-02-26 DIAGNOSIS — E1122 Type 2 diabetes mellitus with diabetic chronic kidney disease: Secondary | ICD-10-CM

## 2019-02-26 DIAGNOSIS — E785 Hyperlipidemia, unspecified: Secondary | ICD-10-CM | POA: Insufficient documentation

## 2019-02-26 DIAGNOSIS — Z888 Allergy status to other drugs, medicaments and biological substances status: Secondary | ICD-10-CM | POA: Insufficient documentation

## 2019-02-26 DIAGNOSIS — Z87891 Personal history of nicotine dependence: Secondary | ICD-10-CM | POA: Insufficient documentation

## 2019-02-26 DIAGNOSIS — I5032 Chronic diastolic (congestive) heart failure: Secondary | ICD-10-CM

## 2019-02-26 DIAGNOSIS — Z79899 Other long term (current) drug therapy: Secondary | ICD-10-CM | POA: Diagnosis not present

## 2019-02-26 DIAGNOSIS — K219 Gastro-esophageal reflux disease without esophagitis: Secondary | ICD-10-CM | POA: Diagnosis not present

## 2019-02-26 DIAGNOSIS — I130 Hypertensive heart and chronic kidney disease with heart failure and stage 1 through stage 4 chronic kidney disease, or unspecified chronic kidney disease: Secondary | ICD-10-CM | POA: Diagnosis present

## 2019-02-26 DIAGNOSIS — I10 Essential (primary) hypertension: Secondary | ICD-10-CM

## 2019-02-26 DIAGNOSIS — N189 Chronic kidney disease, unspecified: Secondary | ICD-10-CM | POA: Insufficient documentation

## 2019-02-26 DIAGNOSIS — N182 Chronic kidney disease, stage 2 (mild): Secondary | ICD-10-CM

## 2019-02-26 NOTE — Patient Instructions (Signed)
Continue weighing daily and call for an overnight weight gain of > 2 pounds or a weekly weight gain of >5 pounds. 

## 2019-02-26 NOTE — Progress Notes (Signed)
[facility] - PHARMACIST COUNSELING NOTE  ADHERENCE ASSESSMENT  Adherence strategy: pill box   Do you ever forget to take your medication? [] Yes (1) [x] No (0)  Do you ever skip doses due to side effects? [] Yes (1) [x] No (0)  Do you have trouble affording your medicines? [] Yes (1) [x] No (0)  Are you ever unable to pick up your medication due to transportation difficulties? [] Yes (1) [x] No (0)  Do you ever stop taking your medications because you don't believe they are helping? [] Yes (1) [x] No (0)  Total score _0______    Recommendations given to patient about increasing adherence: Patient's daughter sets up her pill box and manages her medications for her.  Guideline-Directed Medical Therapy/Evidence Based Medicine    ACE/ARB/ARNI: losartan 100 mg daily   Beta Blocker: metoprolol tartrate 75 mg twice daily   Aldosterone Antagonist: none Diuretic: furosemide 40 mg daily    SUBJECTIVE   HPI: Here for follow up visit. She says she has felt "swimmy headed" the past few days. Her daughters manage her medications, weight and BP daily.  Past Medical History:  Diagnosis Date  . Aortic valve stenosis   . Carotid bruit   . CHF (congestive heart failure) ([place])   . Chronic kidney disease   . Diabetes mellitus without complication ([place])   . GERD (gastroesophageal reflux disease)   . Heart murmur   . Hyperlipidemia   . Hypertension   . Pneumonia   . Vitamin D deficiency         OBJECTIVE    Vital signs: HR 88, BP 169/64, weight (pounds) 168  ECHO: Date 01/02/19, EF 45-50%  Cath: Date 01/23/19, EF > 65%  BMP Latest Ref Rng & Units 01/06/2019 01/05/2019 01/04/2019  Glucose 70 - 99 mg/dL 324(H) 257(H) 233(H)  BUN 8 - 23 mg/dL 34(H) 47(H) 41(H)  Creatinine 0.44 - 1.00 mg/dL 1.13(H) 1.51(H) 1.56(H)  Sodium 135 - 145 mmol/L 137 138 140  Potassium 3.5 - 5.1 mmol/L 4.5 4.3 4.4  Chloride 98 - 111 mmol/L 103 108 109  CO2 22 - 32 mmol/L 24 23  23   Calcium 8.9 - 10.3 mg/dL 8.6(L) 8.2(L) 8.2(L)    ASSESSMENT 78 year old female with HFpEF. She reports feeling "swimmy headed" the past few days. Denies symptoms upon change in position; she already moves slowly when getting out of bed or sitting to standing. Reports feeling dizzy and sweaty during these episodes. She does not know what medications she takes but takes her medications everyday. Her daughter puts them in a pill box for her. Her daughter also weighs her daily and takes her BP. They have not noticed weight gain of 3 or more pounds overnight or 5 pounds in a week. She takes medications twice daily, so does not think she is taking hydralazine TID. Started metformin back but unsure of dose. Imdur recently started as well. Metoprolol tartrate increased to 75 mg twice daily.   PLAN Encouraged patient to check medicine bottle for hydralazine to see how she is supposed to take it and speak with her Cardiologist. We discussed that there could be several reasons she may be "swimmy headed," including recent increase in metoprolol, low BP or low glucose. Counseled patient to check sugar when she has episode to make sure it is not low. Requested patient bring medications and BP/weight log to visit.    Time spent: 10 minutes  Davis, Pharm.D. 02/26/2019 9:33 AM    Current Outpatient  Medications:  .  ALBUTEROL IN, Inhale 90 mcg into the lungs every 6 (six) hours as needed (wheezing)., Disp: , Rfl:  .  amLODipine (NORVASC) 10 MG tablet, Take 10 mg by mouth daily., Disp: , Rfl:  .  aspirin 81 MG tablet, Take 81 mg by mouth daily., Disp: , Rfl:  .  atorvastatin (LIPITOR) 40 MG tablet, Take 1 tablet by mouth daily., Disp: , Rfl:  .  cetirizine (ZYRTEC) 10 MG tablet, Take 10 mg by mouth daily. , Disp: , Rfl:  .  Choline Fenofibrate 45 MG capsule, Take 90 mg by mouth daily., Disp: , Rfl:  .  furosemide (LASIX) 40 MG tablet, Take 1 tablet (40 mg total) by mouth daily., Disp: 30 tablet,  Rfl: 1 .  glipiZIDE (GLUCOTROL XL) 10 MG 24 hr tablet, Take 10 mg by mouth 2 (two) times daily., Disp: , Rfl:  .  hydrALAZINE (APRESOLINE) 25 MG tablet, Take 25 mg by mouth 3 (three) times daily. Taking twice a day, Disp: , Rfl:  .  isosorbide mononitrate (IMDUR) 30 MG 24 hr tablet, Take 30 mg by mouth daily., Disp: , Rfl:  .  Liraglutide (VICTOZA) 18 MG/3ML SOPN, Inject 1.8 mg into the skin every morning., Disp: , Rfl:  .  losartan (COZAAR) 100 MG tablet, Take 100 mg by mouth daily., Disp: , Rfl:  .  metFORMIN (GLUCOPHAGE) 1000 MG tablet, Take 1,000 mg by mouth 2 (two) times daily with a meal. Just started back. Not sure how many mg, Disp: , Rfl:  .  metoprolol tartrate (LOPRESSOR) 50 MG tablet, Take 1 tablet (50 mg total) by mouth 2 (two) times daily. (Patient taking differently: Take 75 mg by mouth 2 (two) times daily. ), Disp: 60 tablet, Rfl: 1 .  omeprazole (PRILOSEC) 20 MG capsule, Take 20 mg by mouth daily., Disp: , Rfl:  .  ranitidine (ZANTAC) 150 MG tablet, Take 1 tablet by mouth 2 (two) times daily., Disp: , Rfl:  .  sitaGLIPtin (JANUVIA) 100 MG tablet, Take 100 mg by mouth daily., Disp: , Rfl:    COUNSELING POINTS/CLINICAL PEARLS  Losartan (Goal: 150 mg once daily)  Warn female patient to avoid pregnancy and to report a pregnancy that occurs during therapy.  Side effects may include dizziness, upper respiratory infection, nasal congestion, and back pain.  Warn patient to avoid use of potassium supplements or potassium-containing salt substitutes unless they consult healthcare provider. Furosemide  Drug causes sun-sensitivity. Advise patient to use sunscreen and avoid tanning beds. Patient should avoid activities requiring coordination until drug effects are realized, as drug may cause dizziness, vertigo, or blurred vision. This drug may cause hyperglycemia, hyperuricemia, constipation, diarrhea, loss of appetite, nausea, vomiting, purpuric disorder, cramps, spasticity, asthenia,  headache, paresthesia, or scaling eczema. Instruct patient to report unusual bleeding/bruising or signs/symptoms of hypotension, infection, pancreatitis, or ototoxicity (tinnitus, hearing impairment). Advise patient to report signs/symptoms of a severe skin reactions (flu-like symptoms, spreading red rash, or skin/mucous membrane blistering) or erythema multiforme. Instruct patient to eat high-potassium foods during drug therapy, as directed by healthcare professional.  Patient should not drink alcohol while taking this drug.   DRUGS TO AVOID IN HEART FAILURE  Drug or Class Mechanism  Analgesics . NSAIDs . COX-2 inhibitors . Glucocorticoids  Sodium and water retention, increased systemic vascular resistance, decreased response to diuretics   Diabetes Medications . Metformin . Thiazolidinediones o Rosiglitazone (Avandia) o Pioglitazone (Actos) . DPP4 Inhibitors o Saxagliptin (Onglyza) o Sitagliptin (Januvia)   Lactic acidosis Possible  calcium channel blockade   Unknown  Antiarrhythmics . Class I  o Flecainide o Disopyramide . Class III o Sotalol . Other o Dronedarone  Negative inotrope, proarrhythmic   Proarrhythmic, beta blockade  Negative inotrope  Antihypertensives . Alpha Blockers o Doxazosin . Calcium Channel Blockers o Diltiazem o Verapamil o Nifedipine . Central Alpha Adrenergics o Moxonidine . Peripheral Vasodilators o Minoxidil  Increases renin and aldosterone  Negative inotrope    Possible sympathetic withdrawal  Unknown  Anti-infective . Itraconazole . Amphotericin B  Negative inotrope Unknown  Hematologic . Anagrelide . Cilostazol   Possible inhibition of PD IV Inhibition of PD III causing arrhythmias  Neurologic/Psychiatric . Stimulants . Anti-Seizure  Drugs o Carbamazepine o Pregabalin . Antidepressants o Tricyclics o Citalopram . Parkinsons o Bromocriptine o Pergolide o Pramipexole . Antipsychotics o Clozapine . Antimigraine o Ergotamine o Methysergide . Appetite suppressants . Bipolar o Lithium  Peripheral alpha and beta agonist activity  Negative inotrope and chronotrope Calcium channel blockade  Negative inotrope, proarrhythmic Dose-dependent QT prolongation  Excessive serotonin activity/valvular damage Excessive serotonin activity/valvular damage Unknown  IgE mediated hypersensitivy, calcium channel blockade  Excessive serotonin activity/valvular damage Excessive serotonin activity/valvular damage Valvular damage  Direct myofibrillar degeneration, adrenergic stimulation  Antimalarials . Chloroquine . Hydroxychloroquine Intracellular inhibition of lysosomal enzymes  Urologic Agents . Alpha Blockers o Doxazosin o Prazosin o Tamsulosin o Terazosin  Increased renin and aldosterone  Adapted from Page RL, et al. "Drugs That May Cause or Exacerbate Heart Failure: A Scientific Statement from the [place]." Circulation 2016; 299:M42-A83. DOI: 10.1161/CIR.0000000000000426   MEDICATION ADHERENCES TIPS AND STRATEGIES 1. Taking medication as prescribed improves patient outcomes in heart failure (reduces hospitalizations, improves symptoms, increases survival) 2. Side effects of medications can be managed by decreasing doses, switching agents, stopping drugs, or adding additional therapy. Please let someone in the [facility] know if you have having bothersome side effects so we can modify your regimen. Do not alter your medication regimen without talking to [place].  3. Medication reminders can help patients remember to take drugs on time. If you are missing or forgetting doses you can try linking behaviors, using pill boxes, or an electronic reminder like an alarm on your phone or an app. Some  people can also get automated phone calls as medication reminders.

## 2019-02-26 NOTE — Progress Notes (Signed)
Patient ID: Brittney [name], female    DOB: April 18, 1941, 78 y.o.   MRN: [id number]  HPI  Brittney [name] is a 78 y/o female with a history of DM, hyperlipidemia, HTN, CKD, GERD, vitamin D deficiency, previous tobacco use and chronic heart failure.   Echo report from 01/02/2019 reviewed and showed an EF of 45-50%.  Catheterization done 01/23/2019: EF >65%  Mid RCA lesion is 99% stenosed.  Prox RCA to Mid RCA lesion is 25% stenosed.  Prox LAD lesion is 70% stenosed.  Mid LAD lesion is 70% stenosed.  Ost Cx to Mid Cx lesion is 15% stenosed.  The left ventricular systolic function is normal.  LV end diastolic pressure is normal.  The left ventricular ejection fraction is greater than 65% by visual estimate.  Admitted 01/02/2019 due to acute hypoxic respiratory failure and acute HF. Cardiology consult obtained. Initially needed oxygen and then transitioned to room air. Needed neo-synephrine for shock. Initially given IV lasix and then transitioned to oral diuretics. Discharged after 4 days.   She presents today for a follow-up visit with a chief complaint of minimal shortness of breath upon moderate exertion. She describes this as chronic in nature having been present for several years. She has associated fatigue, minimal weight gain and intermittent chest pain along with this. She denies any difficulty sleeping, dizziness, abdominal distention, palpitations, pedal edema or cough. Has been wearing support socks almost every day. She did not take her medications yet today.    Past Medical History:  Diagnosis Date  . Aortic valve stenosis   . Carotid bruit   . CHF (congestive heart failure) ([place])   . Chronic kidney disease   . Diabetes mellitus without complication ([place])   . GERD (gastroesophageal reflux disease)   . Heart murmur   . Hyperlipidemia   . Hypertension   . Pneumonia   . Vitamin D deficiency    Past Surgical History:  Procedure Laterality Date  . COLONOSCOPY    . LEFT HEART  CATH AND CORONARY ANGIOGRAPHY N/A 01/23/2019   Procedure: LEFT HEART CATH AND CORONARY ANGIOGRAPHY;  Surgeon: Yolonda Kida, MD;  Location: [facility];  Service: Cardiovascular;  Laterality: N/A;  . ovarian cyst removal     Family History  Problem Relation Age of Onset  . Diabetes Neg Hx   . Hypertension Neg Hx    Social History   Tobacco Use  . Smoking status: Former Smoker    Packs/day: 1.00    Years: 40.00    Pack years: 40.00    Types: Cigarettes    Last attempt to quit: 07/27/2018    Years since quitting: 0.5  . Smokeless tobacco: Never Used  Substance Use Topics  . Alcohol use: No   Allergies  Allergen Reactions  . Accupril [Quinapril Hcl]    Prior to Admission medications   Medication Sig Start Date End Date Taking? Authorizing Provider  ALBUTEROL IN Inhale 90 mcg into the lungs every 6 (six) hours as needed (wheezing).   Yes [provider]  amLODipine (NORVASC) 10 MG tablet Take 10 mg by mouth daily.   Yes [provider]  aspirin 81 MG tablet Take 81 mg by mouth daily.   Yes [provider]  atorvastatin (LIPITOR) 40 MG tablet Take 1 tablet by mouth daily. 07/04/18  Yes [provider]  cetirizine (ZYRTEC) 10 MG tablet Take 10 mg by mouth daily.    Yes [provider]  Choline Fenofibrate 45 MG  capsule Take 90 mg by mouth daily.   Yes [provider]  furosemide (LASIX) 40 MG tablet Take 1 tablet (40 mg total) by mouth daily. 01/07/19  Yes [name], MD  glipiZIDE (GLUCOTROL XL) 10 MG 24 hr tablet Take 10 mg by mouth 2 (two) times daily.   Yes [provider]  hydrALAZINE (APRESOLINE) 25 MG tablet Take 25 mg by mouth 3 (three) times daily. Taking twice a day   Yes [provider]  isosorbide mononitrate (IMDUR) 30 MG 24 hr tablet Take 30 mg by mouth daily.   Yes [provider]  Liraglutide (VICTOZA) 18 MG/3ML SOPN Inject 1.8 mg into the skin every morning.   Yes [provider]  losartan (COZAAR) 100 MG tablet Take 100 mg by mouth daily.   Yes [provider]  metFORMIN (GLUCOPHAGE) 1000 MG tablet Take 1,000 mg by mouth 2 (two) times daily with a meal. Just started back. Not sure how many mg   Yes [provider]  metoprolol tartrate (LOPRESSOR) 50 MG tablet Take 1 tablet (50 mg total) by mouth 2 (two) times daily. Patient taking differently: Take 75 mg by mouth 2 (two) times daily.  08/11/18 02/26/19 Yes Sainani, Belia Heman, MD  omeprazole (PRILOSEC) 20 MG capsule Take 20 mg by mouth daily.   Yes [provider]  ranitidine (ZANTAC) 150 MG tablet Take 1 tablet by mouth 2 (two) times daily. 05/06/18  Yes [provider]  sitaGLIPtin (JANUVIA) 100 MG tablet Take 100 mg by mouth daily.   Yes [provider]    Review of Systems  Constitutional: Positive for fatigue. Negative for appetite change.  HENT: Negative for congestion, rhinorrhea, sneezing and sore throat.   Eyes: Negative.   Respiratory: Positive for shortness of breath (with moderate exertion). Negative for cough.   Cardiovascular: Positive for chest pain (last week). Negative for palpitations and leg swelling.  Gastrointestinal: Negative for abdominal distention and abdominal pain.  Endocrine: Negative.   Genitourinary: Negative.   Musculoskeletal: Negative for back pain and neck pain.  Skin: Negative.   Allergic/Immunologic: Negative.   Neurological: Negative for dizziness and light-headedness.  Hematological: Negative for adenopathy. Does not bruise/bleed easily.  Psychiatric/Behavioral: Negative for dysphoric mood and sleep disturbance (sleeping on 1 pillow). The patient is not nervous/anxious.    Vitals:   02/26/19 0853  BP: (!) 169/64  Pulse: 88  Resp: 18  SpO2: 99%  Weight: 168 lb (76.2 kg)  Height: 5\' 6"  (2.671 m)   Wt Readings from Last 3 Encounters:  02/26/19 168 lb (76.2 kg)  01/23/19 168 lb (76.2 kg)  01/17/19 165 lb (74.8 kg)    Lab Results  Component Value Date   CREATININE 1.13 (H) 01/06/2019   CREATININE 1.51 (H) 01/05/2019   CREATININE 1.56 (H) 01/04/2019    Physical Exam Vitals signs and nursing note reviewed.  Constitutional:      Appearance: Normal appearance.  HENT:     Head: Normocephalic and atraumatic.  Neck:     Musculoskeletal: Normal range of motion and neck supple.  Cardiovascular:     Rate and Rhythm: Normal rate and regular rhythm.  Pulmonary:     Effort: Pulmonary effort is normal.     Breath sounds: No wheezing or rales.  Abdominal:     General: There is no distension.     Palpations: Abdomen is soft.  Musculoskeletal:        General: No tenderness.     Right lower  leg: Edema (trace pitting) present.     Left lower leg: No edema.  Skin:    General: Skin is warm and dry.  Neurological:     General: No focal deficit present.     Mental Status: She is alert and oriented to person, place, and time.  Psychiatric:        Mood and Affect: Mood normal.        Behavior: Behavior normal.        Thought Content: Thought content normal.   Assessment & Plan:  1: Chronic heart failure with preserved ejection fraction- - NYHA class II - euvolemic today - weighing daily and she was instructed to call for an overnight weight gain of >2 pounds or a weekly weight gain of >5 pounds - weight up 3 pounds from last visit here 6 weeks ago - not adding salt to her food and reviewed the importance of closely following a 2000mg  sodium diet. She is already rinsing her canned foods prior to cooking. - saw cardiology Clayborn Bigness) 01/29/2019 - BNP 01/02/2019 was 258.0 - patient says that she's received her flu vaccine - she says that she hasn't smoked since August 2019 - PharmD reconciled medications with the patient  2: HTN- - BP elevated but she hasn't taken her medications yet today; emphasized that she needed to take her medications prior to coming to her visits so that we can assess the  effectiveness of the medication - follows with PCP Frederico Hamman) & returns next week - BMP from 01/06/2019 reviewed and showed sodium 137, potassium 4.5, creatinine 1.13 and GFR 54  3: Diabetes- - glucose at home yesterday evening was 159 - A1c 08/06/18 was 7.3%  Patient did not bring her medications nor a list. Each medication was verbally reviewed with the patient and she was encouraged to bring the bottles to every visit to confirm accuracy of list.  Return in 2 months or sooner for any questions/problems before then.

## 2019-02-27 ENCOUNTER — Ambulatory Visit
Admission: RE | Admit: 2019-02-27 | Discharge: 2019-02-27 | Disposition: A | Discharge status: Home / Self Care | Payer: Medicare HMO | Source: Ambulatory Visit | Attending: Family Medicine | Admitting: Family Medicine

## 2019-02-27 DIAGNOSIS — Z1231 Encounter for screening mammogram for malignant neoplasm of breast: Secondary | ICD-10-CM

## 2019-04-30 ENCOUNTER — Telehealth: Payer: Self-pay | Admitting: Family

## 2019-04-30 ENCOUNTER — Telehealth: Payer: 59 | Admitting: Family

## 2019-04-30 NOTE — Telephone Encounter (Signed)
Patient did not return call regarding [facility] telemedicine visit on 04/30/2019. Will attempt to reschedule.

## 2019-06-08 ENCOUNTER — Telehealth: Payer: Self-pay

## 2019-06-08 ENCOUNTER — Other Ambulatory Visit: Payer: 59

## 2019-06-08 DIAGNOSIS — Z20822 Contact with and (suspected) exposure to covid-19: Secondary | ICD-10-CM

## 2019-06-08 NOTE — Telephone Encounter (Signed)
Call from [facility]. Per Dr Delight Stare pt needs Covid-19 testing.  Office [phone] Fax [phone]  Call placed to patient. Pt scheduled and order placed.

## 2019-06-10 LAB — NOVEL CORONAVIRUS, NAA: SARS-CoV-2, NAA: NOT DETECTED

## 2019-06-24 ENCOUNTER — Emergency Department: Payer: Medicare HMO

## 2019-06-24 ENCOUNTER — Encounter: Payer: Self-pay | Admitting: Emergency Medicine

## 2019-06-24 ENCOUNTER — Inpatient Hospital Stay
Admission: EM | Admit: 2019-06-24 | Discharge: 2019-06-26 | DRG: 384 | Disposition: A | Discharge status: Home / Self Care | Payer: Medicare HMO | Attending: Internal Medicine | Admitting: Internal Medicine

## 2019-06-24 DIAGNOSIS — K279 Peptic ulcer, site unspecified, unspecified as acute or chronic, without hemorrhage or perforation: Secondary | ICD-10-CM

## 2019-06-24 DIAGNOSIS — Z8701 Personal history of pneumonia (recurrent): Secondary | ICD-10-CM

## 2019-06-24 DIAGNOSIS — K2980 Duodenitis without bleeding: Secondary | ICD-10-CM | POA: Diagnosis present

## 2019-06-24 DIAGNOSIS — I700 Atherosclerosis of aorta: Secondary | ICD-10-CM | POA: Diagnosis present

## 2019-06-24 DIAGNOSIS — I350 Nonrheumatic aortic (valve) stenosis: Secondary | ICD-10-CM | POA: Diagnosis present

## 2019-06-24 DIAGNOSIS — Z888 Allergy status to other drugs, medicaments and biological substances status: Secondary | ICD-10-CM

## 2019-06-24 DIAGNOSIS — K2270 Barrett's esophagus without dysplasia: Secondary | ICD-10-CM | POA: Diagnosis present

## 2019-06-24 DIAGNOSIS — E860 Dehydration: Secondary | ICD-10-CM | POA: Diagnosis present

## 2019-06-24 DIAGNOSIS — E1165 Type 2 diabetes mellitus with hyperglycemia: Secondary | ICD-10-CM | POA: Diagnosis present

## 2019-06-24 DIAGNOSIS — N189 Chronic kidney disease, unspecified: Secondary | ICD-10-CM | POA: Diagnosis present

## 2019-06-24 DIAGNOSIS — I2510 Atherosclerotic heart disease of native coronary artery without angina pectoris: Secondary | ICD-10-CM | POA: Diagnosis present

## 2019-06-24 DIAGNOSIS — E872 Acidosis: Secondary | ICD-10-CM | POA: Diagnosis present

## 2019-06-24 DIAGNOSIS — Z8711 Personal history of peptic ulcer disease: Secondary | ICD-10-CM

## 2019-06-24 DIAGNOSIS — K269 Duodenal ulcer, unspecified as acute or chronic, without hemorrhage or perforation: Secondary | ICD-10-CM | POA: Diagnosis not present

## 2019-06-24 DIAGNOSIS — Z7984 Long term (current) use of oral hypoglycemic drugs: Secondary | ICD-10-CM

## 2019-06-24 DIAGNOSIS — E785 Hyperlipidemia, unspecified: Secondary | ICD-10-CM | POA: Diagnosis present

## 2019-06-24 DIAGNOSIS — Z79899 Other long term (current) drug therapy: Secondary | ICD-10-CM

## 2019-06-24 DIAGNOSIS — I509 Heart failure, unspecified: Secondary | ICD-10-CM | POA: Diagnosis present

## 2019-06-24 DIAGNOSIS — K228 Other specified diseases of esophagus: Secondary | ICD-10-CM

## 2019-06-24 DIAGNOSIS — Z87891 Personal history of nicotine dependence: Secondary | ICD-10-CM

## 2019-06-24 DIAGNOSIS — R739 Hyperglycemia, unspecified: Secondary | ICD-10-CM

## 2019-06-24 DIAGNOSIS — K8020 Calculus of gallbladder without cholecystitis without obstruction: Secondary | ICD-10-CM | POA: Diagnosis present

## 2019-06-24 DIAGNOSIS — K2100 Gastro-esophageal reflux disease with esophagitis, without bleeding: Secondary | ICD-10-CM

## 2019-06-24 DIAGNOSIS — E1122 Type 2 diabetes mellitus with diabetic chronic kidney disease: Secondary | ICD-10-CM | POA: Diagnosis present

## 2019-06-24 DIAGNOSIS — I248 Other forms of acute ischemic heart disease: Secondary | ICD-10-CM | POA: Diagnosis present

## 2019-06-24 DIAGNOSIS — Z7982 Long term (current) use of aspirin: Secondary | ICD-10-CM

## 2019-06-24 DIAGNOSIS — K2289 Other specified disease of esophagus: Secondary | ICD-10-CM

## 2019-06-24 DIAGNOSIS — I130 Hypertensive heart and chronic kidney disease with heart failure and stage 1 through stage 4 chronic kidney disease, or unspecified chronic kidney disease: Secondary | ICD-10-CM | POA: Diagnosis present

## 2019-06-24 DIAGNOSIS — Z20828 Contact with and (suspected) exposure to other viral communicable diseases: Secondary | ICD-10-CM | POA: Diagnosis present

## 2019-06-24 LAB — COMPREHENSIVE METABOLIC PANEL
ALT: 21 U/L (ref 0–44)
AST: 21 U/L (ref 15–41)
Albumin: 3.7 g/dL (ref 3.5–5.0)
Alkaline Phosphatase: 58 U/L (ref 38–126)
Anion gap: 17 — ABNORMAL HIGH (ref 5–15)
BUN: 23 mg/dL (ref 8–23)
CO2: 20 mmol/L — ABNORMAL LOW (ref 22–32)
Calcium: 10.3 mg/dL (ref 8.9–10.3)
Chloride: 98 mmol/L (ref 98–111)
Creatinine, Ser: 1.14 mg/dL — ABNORMAL HIGH (ref 0.44–1.00)
GFR calc Af Amer: 53 mL/min — ABNORMAL LOW (ref >60)
GFR calc non Af Amer: 46 mL/min — ABNORMAL LOW (ref >60)
Glucose, Bld: 428 mg/dL — ABNORMAL HIGH (ref 70–99)
Potassium: 4.2 mmol/L (ref 3.5–5.1)
Sodium: 135 mmol/L (ref 135–145)
Total Bilirubin: 0.9 mg/dL (ref 0.3–1.2)
Total Protein: 7.4 g/dL (ref 6.5–8.1)

## 2019-06-24 LAB — CBC WITH DIFFERENTIAL/PLATELET
Abs Immature Granulocytes: 0.09 10*3/uL — ABNORMAL HIGH (ref 0.00–0.07)
Basophils Absolute: 0.1 10*3/uL (ref 0.0–0.1)
Basophils Relative: 1 %
Eosinophils Absolute: 0 10*3/uL (ref 0.0–0.5)
Eosinophils Relative: 0 %
HCT: 35.1 % — ABNORMAL LOW (ref 36.0–46.0)
Hemoglobin: 11.5 g/dL — ABNORMAL LOW (ref 12.0–15.0)
Immature Granulocytes: 1 %
Lymphocytes Relative: 14 %
Lymphs Abs: 1.7 10*3/uL (ref 0.7–4.0)
MCH: 26.3 pg (ref 26.0–34.0)
MCHC: 32.8 g/dL (ref 30.0–36.0)
MCV: 80.3 fL (ref 80.0–100.0)
Monocytes Absolute: 0.7 10*3/uL (ref 0.1–1.0)
Monocytes Relative: 6 %
Neutro Abs: 9.4 10*3/uL — ABNORMAL HIGH (ref 1.7–7.7)
Neutrophils Relative %: 78 %
Platelets: 424 10*3/uL — ABNORMAL HIGH (ref 150–400)
RBC: 4.37 MIL/uL (ref 3.87–5.11)
RDW: 14.6 % (ref 11.5–15.5)
WBC: 11.9 10*3/uL — ABNORMAL HIGH (ref 4.0–10.5)
nRBC: 0 % (ref 0.0–0.2)

## 2019-06-24 LAB — TROPONIN I (HIGH SENSITIVITY)
Troponin I (High Sensitivity): 24 ng/L — ABNORMAL HIGH (ref <18)
Troponin I (High Sensitivity): 24 ng/L — ABNORMAL HIGH (ref <18)

## 2019-06-24 LAB — LIPASE, BLOOD: Lipase: 41 U/L (ref 11–51)

## 2019-06-24 MED ORDER — PANTOPRAZOLE SODIUM 40 MG IV SOLR
40.0000 mg | Freq: Once | INTRAVENOUS | Status: AC
  Administered 2019-06-24: 40 mg via INTRAVENOUS
  Filled 2019-06-24: qty 40

## 2019-06-24 MED ORDER — IOHEXOL 300 MG/ML  SOLN
100.0000 mL | Freq: Once | INTRAMUSCULAR | Status: AC | PRN
  Administered 2019-06-24: 23:00:00 100 mL via INTRAVENOUS

## 2019-06-24 MED ORDER — INSULIN ASPART 100 UNIT/ML ~~LOC~~ SOLN
7.0000 [IU] | Freq: Once | SUBCUTANEOUS | Status: AC
  Administered 2019-06-24: 7 [IU] via SUBCUTANEOUS
  Filled 2019-06-24: qty 1

## 2019-06-24 MED ORDER — SODIUM CHLORIDE 0.9 % IV BOLUS
1000.0000 mL | Freq: Once | INTRAVENOUS | Status: AC
  Administered 2019-06-24: 22:00:00 1000 mL via INTRAVENOUS

## 2019-06-24 MED ORDER — SODIUM CHLORIDE 0.9 % IV BOLUS
1000.0000 mL | Freq: Once | INTRAVENOUS | Status: AC
  Administered 2019-06-24: 1000 mL via INTRAVENOUS

## 2019-06-24 MED ORDER — IOHEXOL 240 MG/ML SOLN: 50.0000 mL | Freq: Once | INTRAMUSCULAR | Status: DC | PRN

## 2019-06-24 MED ORDER — SODIUM CHLORIDE 0.9 % IV SOLN
Freq: Once | INTRAVENOUS | Status: AC
  Administered 2019-06-25: 02:00:00 via INTRAVENOUS

## 2019-06-24 MED ORDER — ONDANSETRON HCL 4 MG/2ML IJ SOLN
4.0000 mg | Freq: Once | INTRAMUSCULAR | Status: AC
  Administered 2019-06-24: 22:00:00 4 mg via INTRAVENOUS
  Filled 2019-06-24: qty 2

## 2019-06-24 MED ORDER — FENTANYL CITRATE (PF) 100 MCG/2ML IJ SOLN: 25.0000 ug | Freq: Once | INTRAMUSCULAR | Status: DC

## 2019-06-24 MED ORDER — FAMOTIDINE IN NACL 20-0.9 MG/50ML-% IV SOLN
20.0000 mg | Freq: Once | INTRAVENOUS | Status: AC
  Administered 2019-06-24: 20 mg via INTRAVENOUS
  Filled 2019-06-24: qty 50

## 2019-06-24 NOTE — ED Triage Notes (Signed)
Pt to ED by EMS from home with c/o of Epigastric Pain with N/V/D that started this morning at approx 3:30. Pt was recently tested for COVID-19 and received negative results on Friday.

## 2019-06-24 NOTE — ED Notes (Signed)
Pt to ct scan.

## 2019-06-24 NOTE — ED Notes (Signed)
Grn/lav tubes sent to lab off EMS IV. Vitals taken. Warm blankets and pillow given to pt.

## 2019-06-24 NOTE — ED Notes (Signed)
Pt's daughter updated via telephone with pt's permission.

## 2019-06-24 NOTE — ED Notes (Signed)
Pt up to restroom to void.  

## 2019-06-24 NOTE — ED Provider Notes (Signed)
[facility] Emergency Department Provider Note  ____________________________________________   First MD Initiated Contact with Patient 06/24/19 1743     (approximate)  I have reviewed the triage vital signs and the nursing notes.   HISTORY  Chief Complaint Emesis and Diarrhea    HPI Brittney [name] is a 78 y.o. female with past medical history as below here with epigastric pain, nausea, vomiting, diarrhea.  The patient states that for the last several days, she has had gradual onset of progressively worsening mild, epigastric pain with associated nausea and nonbloody, nonbilious emesis.  She has had associated loose stools.  She is had no fevers or chills.  Denies any active chest pain at rest, but it does seem to hurt after she vomits.  No fevers or chills.  No recent sick contacts.  No recent travel.  No cough or shortness of breath.  No known specific sick contacts.  Pain seems to come and go randomly, is not particularly worse with eating or drinking.        Past Medical History:  Diagnosis Date   Aortic valve stenosis    Carotid bruit    CHF (congestive heart failure) ([facility])    Chronic kidney disease    Diabetes mellitus without complication ([facility])    GERD (gastroesophageal reflux disease)    Heart murmur    Hyperlipidemia    Hypertension    Pneumonia    Vitamin D deficiency     Patient Active Problem List   Diagnosis Date Noted   Chronic heart failure with preserved ejection fraction (HFpEF) ([place]) 02/26/2019   Benign essential HTN 01/17/2019   DM (diabetes mellitus) ([place]) 01/17/2019    Past Surgical History:  Procedure Laterality Date   COLONOSCOPY     LEFT HEART CATH AND CORONARY ANGIOGRAPHY N/A 01/23/2019   Procedure: LEFT HEART CATH AND CORONARY ANGIOGRAPHY;  Surgeon: Yolonda Kida, MD;  Location: [facility];  Service: Cardiovascular;  Laterality: N/A;   ovarian cyst removal      Prior to Admission  medications   Medication Sig Start Date End Date Taking? Authorizing Provider  ALBUTEROL IN Inhale 90 mcg into the lungs every 6 (six) hours as needed (wheezing).    [provider]  amLODipine (NORVASC) 10 MG tablet Take 10 mg by mouth daily.    [provider]  aspirin 81 MG tablet Take 81 mg by mouth daily.    [provider]  atorvastatin (LIPITOR) 40 MG tablet Take 1 tablet by mouth daily. 07/04/18   [provider]  cetirizine (ZYRTEC) 10 MG tablet Take 10 mg by mouth daily.     [provider]  Choline Fenofibrate 45 MG capsule Take 90 mg by mouth daily.    [provider]  furosemide (LASIX) 40 MG tablet Take 1 tablet (40 mg total) by mouth daily. 01/07/19   [name], MD  glipiZIDE (GLUCOTROL XL) 10 MG 24 hr tablet Take 10 mg by mouth 2 (two) times daily.    [provider]  hydrALAZINE (APRESOLINE) 25 MG tablet Take 25 mg by mouth 3 (three) times daily. Taking twice a day    [provider]  isosorbide mononitrate (IMDUR) 30 MG 24 hr tablet Take 30 mg by mouth daily.    [provider]  Liraglutide (VICTOZA) 18 MG/3ML SOPN Inject 1.8 mg into the skin every morning.    [provider]  losartan (COZAAR) 100 MG tablet Take 100 mg by mouth  daily.    [provider]  metFORMIN (GLUCOPHAGE) 1000 MG tablet Take 1,000 mg by mouth 2 (two) times daily with a meal. Just started back. Not sure how many mg    [provider]  metoprolol tartrate (LOPRESSOR) 50 MG tablet Take 1 tablet (50 mg total) by mouth 2 (two) times daily. Patient taking differently: Take 75 mg by mouth 2 (two) times daily.  08/11/18 02/26/19  [name], MD  omeprazole (PRILOSEC) 20 MG capsule Take 20 mg by mouth daily.    [provider]  ranitidine (ZANTAC) 150 MG tablet Take 1 tablet by mouth 2 (two) times daily. 05/06/18   [provider]  sitaGLIPtin (JANUVIA) 100 MG tablet Take 100 mg by mouth  daily.    [provider]    Allergies Accupril [quinapril hcl]  Family History  Problem Relation Age of Onset   Diabetes Neg Hx    Hypertension Neg Hx    Breast cancer Neg Hx     Social History Social History   Tobacco Use   Smoking status: Former Smoker    Packs/day: 1.00    Years: 40.00    Pack years: 40.00    Types: Cigarettes    Quit date: 07/27/2018    Years since quitting: 0.9   Smokeless tobacco: Never Used  Substance Use Topics   Alcohol use: No   Drug use: No    Review of Systems  Review of Systems  Constitutional: Positive for fatigue. Negative for fever.  HENT: Negative for congestion and sore throat.   Eyes: Negative for visual disturbance.  Respiratory: Negative for cough and shortness of breath.   Cardiovascular: Negative for chest pain.  Gastrointestinal: Positive for abdominal pain, diarrhea, nausea and vomiting.  Genitourinary: Negative for flank pain.  Musculoskeletal: Negative for back pain and neck pain.  Skin: Negative for rash and wound.  Neurological: Positive for weakness.     ____________________________________________  PHYSICAL EXAM:      VITAL SIGNS: ED Triage Vitals  Enc Vitals Group     BP 06/24/19 1801 (!) 202/71     Pulse Rate 06/24/19 1801 (!) 114     Resp 06/24/19 1801 16     Temp 06/24/19 1801 97.7 F (36.5 C)     Temp src --      SpO2 06/24/19 1801 99 %     Weight 06/24/19 1805 170 lb (77.1 kg)     Height 06/24/19 1805 5\' 6"  (1.676 m)     Head Circumference --      Peak Flow --      Pain Score 06/24/19 1805 0     Pain Loc --      Pain Edu? --      Excl. in [place]? --      Physical Exam Vitals signs and nursing note reviewed.  Constitutional:      General: She is not in acute distress.    Appearance: She is well-developed.  HENT:     Head: Normocephalic and atraumatic.  Eyes:     Conjunctiva/sclera: Conjunctivae normal.  Neck:     Musculoskeletal: Neck supple.  Cardiovascular:     Rate and  Rhythm: Normal rate and regular rhythm.     Heart sounds: Normal heart sounds. No murmur. No friction rub.     Comments: Systolic murmur appreciated Pulmonary:     Effort: Pulmonary effort is normal. No respiratory distress.     Breath sounds: Normal breath sounds. No wheezing or  rales.  Abdominal:     General: Abdomen is flat. There is no distension.     Palpations: Abdomen is soft.     Tenderness: There is abdominal tenderness (Mild, epigastric).  Skin:    General: Skin is warm.     Capillary Refill: Capillary refill takes less than 2 seconds.  Neurological:     Mental Status: She is alert and oriented to person, place, and time.     Motor: No abnormal muscle tone.       ____________________________________________   LABS (all labs ordered are listed, but only abnormal results are displayed)  Labs Reviewed  CBC WITH DIFFERENTIAL/PLATELET - Abnormal; Notable for the following components:      Result Value   WBC 11.9 (*)    Hemoglobin 11.5 (*)    HCT 35.1 (*)    Platelets 424 (*)    Neutro Abs 9.4 (*)    Abs Immature Granulocytes 0.09 (*)    All other components within normal limits  COMPREHENSIVE METABOLIC PANEL - Abnormal; Notable for the following components:   CO2 20 (*)    Glucose, Bld 428 (*)    Creatinine, Ser 1.14 (*)    GFR calc non Af Amer 46 (*)    GFR calc Af Amer 53 (*)    Anion gap 17 (*)    All other components within normal limits  TROPONIN I (HIGH SENSITIVITY) - Abnormal; Notable for the following components:   Troponin I (High Sensitivity) 24 (*)    All other components within normal limits  TROPONIN I (HIGH SENSITIVITY) - Abnormal; Notable for the following components:   Troponin I (High Sensitivity) 24 (*)    All other components within normal limits  LIPASE, BLOOD  LACTIC ACID, PLASMA  LACTIC ACID, PLASMA  URINALYSIS, COMPLETE (UACMP) WITH MICROSCOPIC    ____________________________________________  EKG: Sinus tachycardia, ventricular rate  108.  PR 142, QRS 78, QTc 447.  No significant change from prior. ________________________________________  RADIOLOGY All imaging, including plain films, CT scans, and ultrasounds, independently reviewed by me, and interpretations confirmed via formal radiology reads.  ED MD interpretation:   Pending  Official radiology report(s): Dg Chest 2 View  Result Date: 06/24/2019 CLINICAL DATA:  Chest pain, epigastric pain, nausea, vomiting, and diarrhea beginning this morning, recently tested and is COVID-19 negative EXAM: CHEST - 2 VIEW COMPARISON:  01/02/2019 FINDINGS: Normal heart size, mediastinal contours, and pulmonary vascularity. Atherosclerotic calcification aorta. Lungs clear. No infiltrate, pleural effusion or pneumothorax. Bones demineralized. IMPRESSION: No acute abnormalities. Electronically Signed   By: Lavonia Dana M.D.   On: 06/24/2019 20:20   Ct Abdomen Pelvis W Contrast  Result Date: 06/24/2019 CLINICAL DATA:  78 y/o F; epigastric pain, nausea, vomiting, diarrhea. EXAM: CT ABDOMEN AND PELVIS WITH CONTRAST TECHNIQUE: Multidetector CT imaging of the abdomen and pelvis was performed using the standard protocol following bolus administration of intravenous contrast. CONTRAST:  155mL OMNIPAQUE IOHEXOL 300 MG/ML  SOLN COMPARISON:  08/05/2018 CT abdomen and pelvis. FINDINGS: Lower chest: No acute abnormality. Hepatobiliary: No focal liver abnormality is seen. Cholelithiasis. No pericholecystic fluid collection, gallbladder wall thickening, or biliary dilatation. Pancreas: Unremarkable. No pancreatic ductal dilatation or surrounding inflammatory changes. Spleen: Normal in size without focal abnormality. Adrenals/Urinary Tract: Adrenal glands are unremarkable. Small renal cysts bilaterally. Otherwise kidneys are normal, without renal calculi, focal lesion, or hydronephrosis. Bladder is unremarkable. Stomach/Bowel: Severe wall thickening and surrounding edema centered in the proximal duodenum. No  extraluminal air to indicate perforation. Appendix not identified,  no pericecal inflammation. Otherwise no evidence of bowel wall thickening, distention, or inflammatory changes. Vascular/Lymphatic: Aortic atherosclerosis. No enlarged abdominal or pelvic lymph nodes. Reproductive: Uterus and bilateral adnexa are unremarkable. Other: No abdominal wall hernia or abnormality. No abdominopelvic ascites. Musculoskeletal: No fracture is seen. Lumbar spondylosis with advanced facet arthropathy and L4-5 grade 1 anterolisthesis. IMPRESSION: 1. Severe wall thickening and surrounding edema centered in the proximal duodenum probably representing underlying peptic ulcer disease or possibly infectious/inflammatory duodenitis. No findings of perforation or abscess. 2. Cholelithiasis. 3. Aortic Atherosclerosis (ICD10-I70.0). Electronically Signed   By: Kristine Garbe M.D.   On: 06/24/2019 23:41    ____________________________________________  PROCEDURES   Procedure(s) performed (including Critical Care):  Procedures  ____________________________________________  INITIAL IMPRESSION / MDM / [place] / ED COURSE  As part of my medical decision making, I reviewed the following data within the electronic MEDICAL RECORD NUMBER Notes from prior ED visits and [place] Controlled Substance Database      *LENNY BOUCHILLON was evaluated in Emergency Department on 06/25/2019 for the symptoms described in the history of present illness. She was evaluated in the context of the global COVID-19 pandemic, which necessitated consideration that the patient might be at risk for infection with the SARS-CoV-2 virus that causes COVID-19. Institutional protocols and algorithms that pertain to the evaluation of patients at risk for COVID-19 are in a state of rapid change based on information released by regulatory bodies including the CDC and federal and state organizations. These policies and algorithms were followed during  the patient's care in the ED.  Some ED evaluations and interventions may be delayed as a result of limited staffing during the pandemic.*      Medical Decision Making: 78 year old female here with nausea, vomiting, and generalized weakness.  Lab work shows leukocytosis, mild likely demand ischemia, as well as decreased bicarb consistent with dehydration.  Glucose noted to be elevated at 428, and creatinine 1.14.  She has an anion gap of 17 which I suspect is due to her decreased bicarb and possible mild lactic acidosis due to dehydration, but will check ketones in urine.  She is been given subcutaneous insulin, and I see no evidence to suggest moderate or severe DKA.  IV fluids have been given.  CT scan obtained given her age and lab findings, and is consistent with severe duodenitis.  She does have a history of previous ulcer disease.  Will start Protonix, antacids, and plan to admit.  Patient in agreement with this plan.  ____________________________________________  FINAL CLINICAL IMPRESSION(S) / ED DIAGNOSES  Final diagnoses:  PUD (peptic ulcer disease)  Dehydration  Hyperglycemia     MEDICATIONS GIVEN DURING THIS VISIT:  Medications  iohexol (OMNIPAQUE) 240 MG/ML injection 50 mL (has no administration in time range)  fentaNYL (SUBLIMAZE) injection 25 mcg (has no administration in time range)  0.9 %  sodium chloride infusion (has no administration in time range)  famotidine (PEPCID) IVPB 20 mg premix (20 mg Intravenous New Bag/Given 06/24/19 2353)  sodium chloride 0.9 % bolus 1,000 mL (0 mLs Intravenous Stopped 06/24/19 2334)  ondansetron (ZOFRAN) injection 4 mg (4 mg Intravenous Given 06/24/19 2202)  iohexol (OMNIPAQUE) 300 MG/ML solution 100 mL (100 mLs Intravenous Contrast Given 06/24/19 2310)  pantoprazole (PROTONIX) injection 40 mg (40 mg Intravenous Given 06/24/19 2353)  sodium chloride 0.9 % bolus 1,000 mL (1,000 mLs Intravenous New Bag/Given 06/24/19 2354)  insulin aspart  (novoLOG) injection 7 Units (7 Units Subcutaneous Given 06/24/19 2354)  ED Discharge Orders    None       Note:  This document was prepared using Dragon voice recognition software and may include unintentional dictation errors.   Duffy Bruce, MD 06/25/19 Dyann Kief

## 2019-06-24 NOTE — ED Notes (Signed)
Pt has finished her ct contrast, ct notified.

## 2019-06-24 NOTE — ED Notes (Signed)
Pt placed on cardiac monitor 

## 2019-06-24 NOTE — ED Notes (Signed)
Pt sipping on po contrast. 

## 2019-06-25 ENCOUNTER — Other Ambulatory Visit: Payer: Self-pay

## 2019-06-25 DIAGNOSIS — Z8719 Personal history of other diseases of the digestive system: Secondary | ICD-10-CM

## 2019-06-25 DIAGNOSIS — Z791 Long term (current) use of non-steroidal anti-inflammatories (NSAID): Secondary | ICD-10-CM | POA: Diagnosis not present

## 2019-06-25 DIAGNOSIS — I130 Hypertensive heart and chronic kidney disease with heart failure and stage 1 through stage 4 chronic kidney disease, or unspecified chronic kidney disease: Secondary | ICD-10-CM | POA: Diagnosis present

## 2019-06-25 DIAGNOSIS — Z20828 Contact with and (suspected) exposure to other viral communicable diseases: Secondary | ICD-10-CM | POA: Diagnosis present

## 2019-06-25 DIAGNOSIS — E860 Dehydration: Secondary | ICD-10-CM | POA: Diagnosis present

## 2019-06-25 DIAGNOSIS — N189 Chronic kidney disease, unspecified: Secondary | ICD-10-CM | POA: Diagnosis present

## 2019-06-25 DIAGNOSIS — K2980 Duodenitis without bleeding: Secondary | ICD-10-CM | POA: Diagnosis present

## 2019-06-25 DIAGNOSIS — I350 Nonrheumatic aortic (valve) stenosis: Secondary | ICD-10-CM | POA: Diagnosis present

## 2019-06-25 DIAGNOSIS — I2510 Atherosclerotic heart disease of native coronary artery without angina pectoris: Secondary | ICD-10-CM | POA: Diagnosis present

## 2019-06-25 DIAGNOSIS — Z7984 Long term (current) use of oral hypoglycemic drugs: Secondary | ICD-10-CM | POA: Diagnosis not present

## 2019-06-25 DIAGNOSIS — K2270 Barrett's esophagus without dysplasia: Secondary | ICD-10-CM | POA: Diagnosis present

## 2019-06-25 DIAGNOSIS — K210 Gastro-esophageal reflux disease with esophagitis: Secondary | ICD-10-CM | POA: Diagnosis present

## 2019-06-25 DIAGNOSIS — K269 Duodenal ulcer, unspecified as acute or chronic, without hemorrhage or perforation: Secondary | ICD-10-CM | POA: Diagnosis present

## 2019-06-25 DIAGNOSIS — I700 Atherosclerosis of aorta: Secondary | ICD-10-CM | POA: Diagnosis present

## 2019-06-25 DIAGNOSIS — Z7982 Long term (current) use of aspirin: Secondary | ICD-10-CM | POA: Diagnosis not present

## 2019-06-25 DIAGNOSIS — I509 Heart failure, unspecified: Secondary | ICD-10-CM | POA: Diagnosis present

## 2019-06-25 DIAGNOSIS — K228 Other specified diseases of esophagus: Secondary | ICD-10-CM | POA: Diagnosis not present

## 2019-06-25 DIAGNOSIS — Z87891 Personal history of nicotine dependence: Secondary | ICD-10-CM | POA: Diagnosis not present

## 2019-06-25 DIAGNOSIS — I248 Other forms of acute ischemic heart disease: Secondary | ICD-10-CM | POA: Diagnosis present

## 2019-06-25 DIAGNOSIS — E1122 Type 2 diabetes mellitus with diabetic chronic kidney disease: Secondary | ICD-10-CM | POA: Diagnosis present

## 2019-06-25 DIAGNOSIS — E872 Acidosis: Secondary | ICD-10-CM | POA: Diagnosis present

## 2019-06-25 DIAGNOSIS — Z79899 Other long term (current) drug therapy: Secondary | ICD-10-CM | POA: Diagnosis not present

## 2019-06-25 DIAGNOSIS — E785 Hyperlipidemia, unspecified: Secondary | ICD-10-CM | POA: Diagnosis present

## 2019-06-25 DIAGNOSIS — Z8711 Personal history of peptic ulcer disease: Secondary | ICD-10-CM | POA: Diagnosis not present

## 2019-06-25 DIAGNOSIS — K8020 Calculus of gallbladder without cholecystitis without obstruction: Secondary | ICD-10-CM | POA: Diagnosis present

## 2019-06-25 DIAGNOSIS — Z8701 Personal history of pneumonia (recurrent): Secondary | ICD-10-CM | POA: Diagnosis not present

## 2019-06-25 DIAGNOSIS — E1165 Type 2 diabetes mellitus with hyperglycemia: Secondary | ICD-10-CM | POA: Diagnosis present

## 2019-06-25 LAB — SARS CORONAVIRUS 2 BY RT PCR (HOSPITAL ORDER, PERFORMED IN ~~LOC~~ HOSPITAL LAB): SARS Coronavirus 2: NEGATIVE

## 2019-06-25 LAB — URINALYSIS, COMPLETE (UACMP) WITH MICROSCOPIC
Bacteria, UA: NONE SEEN
Bilirubin Urine: NEGATIVE
Glucose, UA: >=500 mg/dL — AB
Hgb urine dipstick: NEGATIVE
Ketones, ur: NEGATIVE mg/dL
Leukocytes,Ua: NEGATIVE
Nitrite: NEGATIVE
Protein, ur: 100 mg/dL — AB
Specific Gravity, Urine: 1.038 — ABNORMAL HIGH (ref 1.005–1.030)
pH: 6 (ref 5.0–8.0)

## 2019-06-25 LAB — GLUCOSE, CAPILLARY
Glucose-Capillary: 331 mg/dL — ABNORMAL HIGH (ref 70–99)
Glucose-Capillary: 218 mg/dL — ABNORMAL HIGH (ref 70–99)
Glucose-Capillary: 142 mg/dL — ABNORMAL HIGH (ref 70–99)
Glucose-Capillary: 268 mg/dL — ABNORMAL HIGH (ref 70–99)

## 2019-06-25 LAB — HEMOGLOBIN A1C
Hgb A1c MFr Bld: 10.5 % — ABNORMAL HIGH (ref 4.8–5.6)
Mean Plasma Glucose: 254.65 mg/dL

## 2019-06-25 LAB — TSH: TSH: 0.858 u[IU]/mL (ref 0.350–4.500)

## 2019-06-25 LAB — LACTIC ACID, PLASMA
Lactic Acid, Venous: 1.6 mmol/L (ref 0.5–1.9)
Lactic Acid, Venous: 2 mmol/L (ref 0.5–1.9)
Lactic Acid, Venous: 2.7 mmol/L (ref 0.5–1.9)
Lactic Acid, Venous: 4.4 mmol/L (ref 0.5–1.9)

## 2019-06-25 MED ORDER — PIPERACILLIN-TAZOBACTAM 3.375 G IVPB 30 MIN
3.3750 g | Freq: Once | INTRAVENOUS | Status: AC
  Administered 2019-06-25: 3.375 g via INTRAVENOUS

## 2019-06-25 MED ORDER — ENOXAPARIN SODIUM 40 MG/0.4ML ~~LOC~~ SOLN
40.0000 mg | SUBCUTANEOUS | Status: DC
  Administered 2019-06-25 – 2019-06-26 (×2): 40 mg via SUBCUTANEOUS
  Filled 2019-06-25 (×3): qty 0.4

## 2019-06-25 MED ORDER — SODIUM CHLORIDE 0.9 % IV SOLN
INTRAVENOUS | Status: DC
  Administered 2019-06-25 (×2): via INTRAVENOUS

## 2019-06-25 MED ORDER — LOSARTAN POTASSIUM 50 MG PO TABS
100.0000 mg | ORAL_TABLET | Freq: Every day | ORAL | Status: DC
  Administered 2019-06-25 – 2019-06-26 (×2): 100 mg via ORAL
  Filled 2019-06-25 (×2): qty 2

## 2019-06-25 MED ORDER — ALUM & MAG HYDROXIDE-SIMETH 200-200-20 MG/5ML PO SUSP
30.0000 mL | Freq: Once | ORAL | Status: AC
  Administered 2019-06-25: 01:00:00 30 mL via ORAL
  Filled 2019-06-25: qty 30

## 2019-06-25 MED ORDER — INSULIN ASPART 100 UNIT/ML ~~LOC~~ SOLN
0.0000 [IU] | Freq: Three times a day (TID) | SUBCUTANEOUS | Status: DC
  Administered 2019-06-25: 09:00:00 11 [IU] via SUBCUTANEOUS
  Administered 2019-06-25: 12:00:00 5 [IU] via SUBCUTANEOUS
  Administered 2019-06-25: 17:00:00 2 [IU] via SUBCUTANEOUS
  Administered 2019-06-26: 13:00:00 3 [IU] via SUBCUTANEOUS
  Administered 2019-06-26: 8 [IU] via SUBCUTANEOUS
  Filled 2019-06-25 (×5): qty 1

## 2019-06-25 MED ORDER — PIPERACILLIN-TAZOBACTAM 3.375 G IVPB
3.3750 g | Freq: Three times a day (TID) | INTRAVENOUS | Status: DC
  Administered 2019-06-25 – 2019-06-26 (×3): 3.375 g via INTRAVENOUS
  Filled 2019-06-25 (×3): qty 50

## 2019-06-25 MED ORDER — HYDRALAZINE HCL 25 MG PO TABS
25.0000 mg | ORAL_TABLET | Freq: Three times a day (TID) | ORAL | Status: DC
  Administered 2019-06-25 – 2019-06-26 (×4): 25 mg via ORAL
  Filled 2019-06-25 (×4): qty 1

## 2019-06-25 MED ORDER — PANTOPRAZOLE SODIUM 40 MG PO TBEC
40.0000 mg | DELAYED_RELEASE_TABLET | Freq: Every day | ORAL | Status: DC
  Administered 2019-06-25: 09:00:00 40 mg via ORAL
  Filled 2019-06-25: qty 1

## 2019-06-25 MED ORDER — AMLODIPINE BESYLATE 10 MG PO TABS
10.0000 mg | ORAL_TABLET | Freq: Every day | ORAL | Status: DC
  Administered 2019-06-25 – 2019-06-26 (×2): 10 mg via ORAL
  Filled 2019-06-25 (×2): qty 1

## 2019-06-25 MED ORDER — LORATADINE 10 MG PO TABS
10.0000 mg | ORAL_TABLET | Freq: Every day | ORAL | Status: DC
  Administered 2019-06-25 – 2019-06-26 (×2): 10 mg via ORAL
  Filled 2019-06-25 (×2): qty 1

## 2019-06-25 MED ORDER — INSULIN ASPART 100 UNIT/ML ~~LOC~~ SOLN
0.0000 [IU] | Freq: Every day | SUBCUTANEOUS | Status: DC
  Administered 2019-06-25: 23:00:00 3 [IU] via SUBCUTANEOUS
  Filled 2019-06-25: qty 1

## 2019-06-25 MED ORDER — ONDANSETRON HCL 4 MG PO TABS: 4.0000 mg | ORAL_TABLET | Freq: Four times a day (QID) | ORAL | Status: DC | PRN

## 2019-06-25 MED ORDER — ATORVASTATIN CALCIUM 20 MG PO TABS
40.0000 mg | ORAL_TABLET | Freq: Every day | ORAL | Status: DC
  Administered 2019-06-25 – 2019-06-26 (×2): 40 mg via ORAL
  Filled 2019-06-25 (×2): qty 2

## 2019-06-25 MED ORDER — DOCUSATE SODIUM 100 MG PO CAPS
100.0000 mg | ORAL_CAPSULE | Freq: Two times a day (BID) | ORAL | Status: DC
  Administered 2019-06-25 – 2019-06-26 (×2): 100 mg via ORAL
  Filled 2019-06-25 (×3): qty 1

## 2019-06-25 MED ORDER — ONDANSETRON HCL 4 MG/2ML IJ SOLN: 4.0000 mg | Freq: Four times a day (QID) | INTRAMUSCULAR | Status: DC | PRN

## 2019-06-25 MED ORDER — ACETAMINOPHEN 325 MG PO TABS
650.0000 mg | ORAL_TABLET | Freq: Four times a day (QID) | ORAL | Status: DC | PRN
  Administered 2019-06-25: 21:00:00 650 mg via ORAL
  Filled 2019-06-25: qty 2

## 2019-06-25 MED ORDER — LINAGLIPTIN 5 MG PO TABS
5.0000 mg | ORAL_TABLET | Freq: Every day | ORAL | Status: DC
  Administered 2019-06-25 – 2019-06-26 (×2): 5 mg via ORAL
  Filled 2019-06-25 (×2): qty 1

## 2019-06-25 MED ORDER — PROCHLORPERAZINE EDISYLATE 10 MG/2ML IJ SOLN
10.0000 mg | Freq: Four times a day (QID) | INTRAMUSCULAR | Status: DC | PRN
  Filled 2019-06-25: qty 2

## 2019-06-25 MED ORDER — INSULIN GLARGINE 100 UNIT/ML ~~LOC~~ SOLN
8.0000 [IU] | Freq: Every day | SUBCUTANEOUS | Status: DC
  Administered 2019-06-25: 17:00:00 8 [IU] via SUBCUTANEOUS
  Filled 2019-06-25 (×2): qty 0.08

## 2019-06-25 MED ORDER — METOPROLOL TARTRATE 25 MG PO TABS
25.0000 mg | ORAL_TABLET | Freq: Two times a day (BID) | ORAL | Status: DC
  Administered 2019-06-25 – 2019-06-26 (×2): 25 mg via ORAL
  Filled 2019-06-25 (×2): qty 1

## 2019-06-25 MED ORDER — ALBUTEROL SULFATE (2.5 MG/3ML) 0.083% IN NEBU: 2.5000 mg | INHALATION_SOLUTION | RESPIRATORY_TRACT | Status: DC | PRN

## 2019-06-25 MED ORDER — PANTOPRAZOLE SODIUM 40 MG IV SOLR
40.0000 mg | Freq: Two times a day (BID) | INTRAVENOUS | Status: DC
  Administered 2019-06-25 (×2): 40 mg via INTRAVENOUS
  Filled 2019-06-25 (×2): qty 40

## 2019-06-25 MED ORDER — LIDOCAINE VISCOUS HCL 2 % MT SOLN
15.0000 mL | Freq: Once | OROMUCOSAL | Status: AC
  Administered 2019-06-25: 01:00:00 15 mL via ORAL
  Filled 2019-06-25: qty 15

## 2019-06-25 MED ORDER — ACETAMINOPHEN 650 MG RE SUPP: 650.0000 mg | Freq: Four times a day (QID) | RECTAL | Status: DC | PRN

## 2019-06-25 MED ORDER — FENOFIBRATE 54 MG PO TABS: 54.0000 mg | ORAL_TABLET | Freq: Every day | ORAL | Status: DC

## 2019-06-25 MED ORDER — ASPIRIN EC 81 MG PO TBEC
81.0000 mg | DELAYED_RELEASE_TABLET | Freq: Every day | ORAL | Status: DC
  Administered 2019-06-25 – 2019-06-26 (×2): 81 mg via ORAL
  Filled 2019-06-25 (×2): qty 1

## 2019-06-25 MED ORDER — ISOSORBIDE MONONITRATE ER 30 MG PO TB24
30.0000 mg | ORAL_TABLET | Freq: Every day | ORAL | Status: DC
  Administered 2019-06-25 – 2019-06-26 (×2): 30 mg via ORAL
  Filled 2019-06-25 (×2): qty 1

## 2019-06-25 NOTE — ED Notes (Signed)
Critical lactic of 4.4 called from lab. Dr. Beather Arbour notified, no new orders received.

## 2019-06-25 NOTE — ED Notes (Signed)
Critical lactic acid of 2.7 called from lab. Dr. Beather Arbour notified, orders received.

## 2019-06-25 NOTE — H&P (Signed)
Brittney [name] is an 78 y.o. female.   Chief Complaint: Vomiting and diarrhea HPI: The patient with past medical history of hypertension, CKD and diabetes presents to the emergency department complaining of nausea, vomiting abdominal pain and diarrhea.  Emesis and diarrhea have both been nonbloody.  She denies fevers, chest pain or shortness of breath.  Laboratory evaluation showed elevated lactic acid and leukocytosis.  CT of her abdomen showed edema of the proximal duodenum without perforation or abscess in addition to cholelithiasis and aortic atherosclerosis.  The patient met criteria for sepsis which prompted fluid resuscitation as well collection of blood cultures prior to administration of Zosyn.  The patient received antiemetics and pain medication in the emergency department prior to the hospitalist service being called for admission.  Past Medical History:  Diagnosis Date  . Aortic valve stenosis   . Carotid bruit   . CHF (congestive heart failure) ([place])   . Chronic kidney disease   . Diabetes mellitus without complication ([place])   . GERD (gastroesophageal reflux disease)   . Heart murmur   . Hyperlipidemia   . Hypertension   . Pneumonia   . Vitamin D deficiency     Past Surgical History:  Procedure Laterality Date  . COLONOSCOPY    . LEFT HEART CATH AND CORONARY ANGIOGRAPHY N/A 01/23/2019   Procedure: LEFT HEART CATH AND CORONARY ANGIOGRAPHY;  Surgeon: Yolonda Kida, MD;  Location: [facility];  Service: Cardiovascular;  Laterality: N/A;  . ovarian cyst removal      Family History  Problem Relation Age of Onset  . Diabetes Neg Hx   . Hypertension Neg Hx   . Breast cancer Neg Hx    Social History:  reports that she quit smoking about 10 months ago. Her smoking use included cigarettes. She has a 40.00 pack-year smoking history. She has never used smokeless tobacco. She reports that she does not drink alcohol or use drugs.  Allergies:  Allergies  Allergen  Reactions  . Accupril [Quinapril Hcl]     Medications Prior to Admission  Medication Sig Dispense Refill  . amLODipine (NORVASC) 10 MG tablet Take 10 mg by mouth daily.    Marland Kitchen aspirin 81 MG tablet Take 81 mg by mouth daily.    . cetirizine (ZYRTEC) 10 MG tablet Take 10 mg by mouth daily.     . hydrALAZINE (APRESOLINE) 25 MG tablet Take 25 mg by mouth 3 (three) times daily. Taking twice a day    . Liraglutide (VICTOZA) 18 MG/3ML SOPN Inject 1.8 mg into the skin every morning.    Marland Kitchen losartan (COZAAR) 100 MG tablet Take 100 mg by mouth daily.    . metFORMIN (GLUCOPHAGE) 1000 MG tablet Take 1,000 mg by mouth 2 (two) times daily with a meal. Just started back. Not sure how many mg    . omeprazole (PRILOSEC) 20 MG capsule Take 20 mg by mouth daily.    . ranitidine (ZANTAC) 150 MG tablet Take 1 tablet by mouth 2 (two) times daily.    . sitaGLIPtin (JANUVIA) 100 MG tablet Take 100 mg by mouth daily.    . ALBUTEROL IN Inhale 90 mcg into the lungs every 6 (six) hours as needed (wheezing).    Marland Kitchen atorvastatin (LIPITOR) 40 MG tablet Take 1 tablet by mouth daily.    . Choline Fenofibrate 45 MG capsule Take 90 mg by mouth daily.    . furosemide (LASIX) 40 MG tablet Take 1 tablet (40 mg total) by mouth  daily. (Patient not taking: Reported on 06/25/2019) 30 tablet 1  . glipiZIDE (GLUCOTROL XL) 10 MG 24 hr tablet Take 10 mg by mouth 2 (two) times daily.    . isosorbide mononitrate (IMDUR) 30 MG 24 hr tablet Take 30 mg by mouth daily.    . metoprolol tartrate (LOPRESSOR) 50 MG tablet Take 1 tablet (50 mg total) by mouth 2 (two) times daily. (Patient not taking: Reported on 06/25/2019) 60 tablet 1    Results for orders placed or performed during the hospital encounter of 06/24/19 (from the past 48 hour(s))  CBC with Differential     Status: Abnormal   Collection Time: 06/24/19  5:50 PM  Result Value Ref Range   WBC 11.9 (H) 4.0 - 10.5 K/uL   RBC 4.37 3.87 - 5.11 MIL/uL   Hemoglobin 11.5 (L) 12.0 - 15.0 g/dL    HCT 35.1 (L) 36.0 - 46.0 %   MCV 80.3 80.0 - 100.0 fL   MCH 26.3 26.0 - 34.0 pg   MCHC 32.8 30.0 - 36.0 g/dL   RDW 14.6 11.5 - 15.5 %   Platelets 424 (H) 150 - 400 K/uL   nRBC 0.0 0.0 - 0.2 %   Neutrophils Relative % 78 %   Neutro Abs 9.4 (H) 1.7 - 7.7 K/uL   Lymphocytes Relative 14 %   Lymphs Abs 1.7 0.7 - 4.0 K/uL   Monocytes Relative 6 %   Monocytes Absolute 0.7 0.1 - 1.0 K/uL   Eosinophils Relative 0 %   Eosinophils Absolute 0.0 0.0 - 0.5 K/uL   Basophils Relative 1 %   Basophils Absolute 0.1 0.0 - 0.1 K/uL   Immature Granulocytes 1 %   Abs Immature Granulocytes 0.09 (H) 0.00 - 0.07 K/uL    Comment: Performed at [facility], [address] [place], [place] [address]  Comprehensive metabolic panel     Status: Abnormal   Collection Time: 06/24/19  5:50 PM  Result Value Ref Range   Sodium 135 135 - 145 mmol/L   Potassium 4.2 3.5 - 5.1 mmol/L   Chloride 98 98 - 111 mmol/L   CO2 20 (L) 22 - 32 mmol/L   Glucose, Bld 428 (H) 70 - 99 mg/dL   BUN 23 8 - 23 mg/dL   Creatinine, Ser 1.14 (H) 0.44 - 1.00 mg/dL   Calcium 10.3 8.9 - 10.3 mg/dL   Total Protein 7.4 6.5 - 8.1 g/dL   Albumin 3.7 3.5 - 5.0 g/dL   AST 21 15 - 41 U/L   ALT 21 0 - 44 U/L   Alkaline Phosphatase 58 38 - 126 U/L   Total Bilirubin 0.9 0.3 - 1.2 mg/dL   GFR calc non Af Amer 46 (L) >60 mL/min   GFR calc Af Amer 53 (L) >60 mL/min   Anion gap 17 (H) 5 - 15    Comment: Performed at [facility], [address] [place], [place] [address]  Lipase, blood     Status: None   Collection Time: 06/24/19  5:50 PM  Result Value Ref Range   Lipase 41 11 - 51 U/L    Comment: Performed at [facility], [place], [place] [address]  Troponin I (High Sensitivity)     Status: Abnormal   Collection Time: 06/24/19  5:50 PM  Result Value Ref Range   Troponin I (High Sensitivity) 24 (H) <18 ng/L    Comment: (NOTE) Elevated high sensitivity troponin I (hsTnI) values and significant  changes across serial measurements may suggest ACS but many other  chronic and acute conditions are known to elevate hsTnI results.  Refer to the "Links" section for chest pain algorithms and additional  guidance. Performed at [facility], [place], [place] [address]   Troponin I (High Sensitivity)     Status: Abnormal   Collection Time: 06/24/19  8:17 PM  Result Value Ref Range   Troponin I (High Sensitivity) 24 (H) <18 ng/L    Comment: (NOTE) Elevated high sensitivity troponin I (hsTnI) values and significant  changes across serial measurements may suggest ACS but many other  chronic and acute conditions are known to elevate hsTnI results.  Refer to the "Links" section for chest pain algorithms and additional  guidance. Performed at [facility], [address] [place], [place] [address]   TSH     Status: None   Collection Time: 06/24/19  8:17 PM  Result Value Ref Range   TSH 0.858 0.350 - 4.500 uIU/mL    Comment: Performed by a 3rd Generation assay with a functional sensitivity of <=0.01 uIU/mL. Performed at [facility], [address] [place], [place] [address]   Lactic acid, plasma     Status: Abnormal   Collection Time: 06/24/19 11:55 PM  Result Value Ref Range   Lactic Acid, Venous 2.7 (HH) 0.5 - 1.9 mmol/L    Comment: CRITICAL RESULT CALLED TO, READ BACK BY AND VERIFIED WITH APRIL BRUMGARD AT 0037 ON 06/25/19 RWW Performed at [facility], [address] [place], [place] [address]   SARS Coronavirus 2 (CEPHEID - Performed in [facility] hospital lab), Hosp Order     Status: None   Collection Time: 06/25/19 12:01 AM   Specimen: Nasopharyngeal Swab  Result Value Ref Range   SARS Coronavirus 2 NEGATIVE NEGATIVE    Comment: (NOTE) If result is NEGATIVE SARS-CoV-2 target nucleic acids are NOT DETECTED. The SARS-CoV-2 RNA is generally detectable in upper and lower  respiratory specimens during the acute phase  of infection. The lowest  concentration of SARS-CoV-2 viral copies this assay can detect is 250  copies / mL. A negative result does not preclude SARS-CoV-2 infection  and should not be used as the sole basis for treatment or other  patient management decisions.  A negative result may occur with  improper specimen collection / handling, submission of specimen other  than nasopharyngeal swab, presence of viral mutation(s) within the  areas targeted by this assay, and inadequate number of viral copies  (<250 copies / mL). A negative result must be combined with clinical  observations, patient history, and epidemiological information. If result is POSITIVE SARS-CoV-2 target nucleic acids are DETECTED. The SARS-CoV-2 RNA is generally detectable in upper and lower  respiratory specimens dur ing the acute phase of infection.  Positive  results are indicative of active infection with SARS-CoV-2.  Clinical  correlation with patient history and other diagnostic information is  necessary to determine patient infection status.  Positive results do  not rule out bacterial infection or co-infection with other viruses. If result is PRESUMPTIVE POSTIVE SARS-CoV-2 nucleic acids MAY BE PRESENT.   A presumptive positive result was obtained on the submitted specimen  and confirmed on repeat testing.  While 2019 novel coronavirus  (SARS-CoV-2) nucleic acids may be present in the submitted sample  additional confirmatory testing may be necessary for epidemiological  and / or clinical management purposes  to differentiate between  SARS-CoV-2 and other Sarbecovirus currently  known to infect humans.  If clinically indicated additional testing with an alternate test  methodology [phone]) is advised. The SARS-CoV-2 RNA is generally  detectable in upper and lower respiratory sp ecimens during the acute  phase of infection. The expected result is Negative. Fact Sheet for Patients:   StrictlyIdeas.no Fact Sheet for Healthcare Providers: BankingDealers.co.za This test is not yet approved or cleared by the [place] FDA and has been authorized for detection and/or diagnosis of SARS-CoV-2 by FDA under an Emergency Use Authorization (EUA).  This EUA will remain in effect (meaning this test can be used) for the duration of the COVID-19 declaration under Section 564(b)(1) of the Act, 21 U.S.C. section 360bbb-3(b)(1), unless the authorization is terminated or revoked sooner. Performed at [facility], [address] [place], [place] [address]   Culture, blood (routine x 2)     Status: None (Preliminary result)   Collection Time: 06/25/19 12:55 AM   Specimen: BLOOD  Result Value Ref Range   Specimen Description BLOOD RIGHT AC    Special Requests      BOTTLES DRAWN AEROBIC AND ANAEROBIC Blood Culture adequate volume   Culture      NO GROWTH < 12 HOURS Performed at [facility], [address], [place], [place] [address]    Report Status PENDING   Culture, blood (routine x 2)     Status: None (Preliminary result)   Collection Time: 06/25/19  1:06 AM   Specimen: BLOOD  Result Value Ref Range   Specimen Description BLOOD RIGHT AC    Special Requests      BOTTLES DRAWN AEROBIC AND ANAEROBIC Blood Culture adequate volume   Culture      NO GROWTH < 12 HOURS Performed at [facility], [address] [place], [place] [address]    Report Status PENDING   Urinalysis, Complete w Microscopic     Status: Abnormal   Collection Time: 06/25/19  1:45 AM  Result Value Ref Range   Color, Urine STRAW (A) YELLOW   APPearance CLEAR (A) CLEAR   Specific Gravity, Urine 1.038 (H) 1.005 - 1.030   pH 6.0 5.0 - 8.0   Glucose, UA >=500 (A) NEGATIVE mg/dL   Hgb urine dipstick NEGATIVE NEGATIVE   Bilirubin Urine NEGATIVE NEGATIVE   Ketones, ur NEGATIVE NEGATIVE mg/dL   Protein, ur 100 (A) NEGATIVE mg/dL    Nitrite NEGATIVE NEGATIVE   Leukocytes,Ua NEGATIVE NEGATIVE   RBC / HPF 0-5 0 - 5 RBC/hpf   WBC, UA 0-5 0 - 5 WBC/hpf   Bacteria, UA NONE SEEN NONE SEEN   Squamous Epithelial / LPF 0-5 0 - 5    Comment: Performed at [facility], [address] [place], [place] [address]  Lactic acid, plasma     Status: Abnormal   Collection Time: 06/25/19  2:33 AM  Result Value Ref Range   Lactic Acid, Venous 4.4 (HH) 0.5 - 1.9 mmol/L    Comment: CRITICAL RESULT CALLED TO, READ BACK BY AND VERIFIED WITH APRIL BRUMGUARD AT 0307 ON 06/25/2019 JJB Performed at [facility], [address] [place], [place] [address]    Dg Chest 2 View  Result Date: 06/24/2019 CLINICAL DATA:  Chest pain, epigastric pain, nausea, vomiting, and diarrhea beginning this morning, recently tested and is COVID-19 negative EXAM: CHEST - 2 VIEW COMPARISON:  01/02/2019 FINDINGS: Normal heart size, mediastinal contours, and pulmonary vascularity. Atherosclerotic calcification aorta. Lungs clear. No infiltrate, pleural effusion or pneumothorax. Bones demineralized. IMPRESSION:  No acute abnormalities. Electronically Signed   By: Lavonia Dana M.D.   On: 06/24/2019 20:20   Ct Abdomen Pelvis W Contrast  Result Date: 06/24/2019 CLINICAL DATA:  78 y/o F; epigastric pain, nausea, vomiting, diarrhea. EXAM: CT ABDOMEN AND PELVIS WITH CONTRAST TECHNIQUE: Multidetector CT imaging of the abdomen and pelvis was performed using the standard protocol following bolus administration of intravenous contrast. CONTRAST:  112m OMNIPAQUE IOHEXOL 300 MG/ML  SOLN COMPARISON:  08/05/2018 CT abdomen and pelvis. FINDINGS: Lower chest: No acute abnormality. Hepatobiliary: No focal liver abnormality is seen. Cholelithiasis. No pericholecystic fluid collection, gallbladder wall thickening, or biliary dilatation. Pancreas: Unremarkable. No pancreatic ductal dilatation or surrounding inflammatory changes. Spleen: Normal in size without focal abnormality.  Adrenals/Urinary Tract: Adrenal glands are unremarkable. Small renal cysts bilaterally. Otherwise kidneys are normal, without renal calculi, focal lesion, or hydronephrosis. Bladder is unremarkable. Stomach/Bowel: Severe wall thickening and surrounding edema centered in the proximal duodenum. No extraluminal air to indicate perforation. Appendix not identified, no pericecal inflammation. Otherwise no evidence of bowel wall thickening, distention, or inflammatory changes. Vascular/Lymphatic: Aortic atherosclerosis. No enlarged abdominal or pelvic lymph nodes. Reproductive: Uterus and bilateral adnexa are unremarkable. Other: No abdominal wall hernia or abnormality. No abdominopelvic ascites. Musculoskeletal: No fracture is seen. Lumbar spondylosis with advanced facet arthropathy and L4-5 grade 1 anterolisthesis. IMPRESSION: 1. Severe wall thickening and surrounding edema centered in the proximal duodenum probably representing underlying peptic ulcer disease or possibly infectious/inflammatory duodenitis. No findings of perforation or abscess. 2. Cholelithiasis. 3. Aortic Atherosclerosis (ICD10-I70.0). Electronically Signed   By: LKristine GarbeM.D.   On: 06/24/2019 23:41    Review of Systems  Constitutional: Negative for chills and fever.  HENT: Negative for sore throat and tinnitus.   Eyes: Negative for blurred vision and redness.  Respiratory: Negative for cough and shortness of breath.   Cardiovascular: Negative for chest pain, palpitations, orthopnea and PND.  Gastrointestinal: Positive for abdominal pain, diarrhea, nausea and vomiting.  Genitourinary: Negative for dysuria, frequency and urgency.  Musculoskeletal: Negative for joint pain and myalgias.  Skin: Negative for rash.       No lesions  Neurological: Negative for speech change, focal weakness and weakness.  Endo/Heme/Allergies: Does not bruise/bleed easily.       No temperature intolerance  Psychiatric/Behavioral: Negative for  depression and suicidal ideas.    Blood pressure (!) 170/67, pulse 95, temperature 99.1 F (37.3 C), temperature source Oral, resp. rate 18, height 5' 6"  (1.676 m), weight 77.1 kg, SpO2 97 %. Physical Exam  Vitals reviewed. Constitutional: She is oriented to person, place, and time. She appears well-developed and well-nourished. No distress.  HENT:  Head: Normocephalic and atraumatic.  Mouth/Throat: Oropharynx is clear and moist.  Eyes: Pupils are equal, round, and reactive to light. Conjunctivae and EOM are normal. No scleral icterus.  Neck: Normal range of motion. Neck supple. No JVD present. No tracheal deviation present. No thyromegaly present.  Cardiovascular: Normal rate, regular rhythm and normal heart sounds. Exam reveals no gallop and no friction rub.  No murmur heard. Respiratory: Effort normal and breath sounds normal.  GI: Soft. Bowel sounds are normal. She exhibits no distension. There is no abdominal tenderness.  Genitourinary:    Genitourinary Comments: Deferred   Musculoskeletal: Normal range of motion.        General: No edema.  Lymphadenopathy:    She has no cervical adenopathy.  Neurological: She is alert and oriented to person, place, and time. No cranial nerve deficit. She exhibits normal  muscle tone.  Skin: Skin is warm and dry. No rash noted. No erythema.  Psychiatric: She has a normal mood and affect. Her behavior is normal. Judgment and thought content normal.     Assessment/Plan This is a 78 year old female admitted for duodenitis. 1.  Duodenitis: Likely sequelae of peptic ulcer disease.  Continue PPI per home regimen.  Patient cannot tolerate p.o. switch to IV.  No hematemesis observed. 2.  Sepsis: Patient meets criteria via tachycardia, tachypnea and leukocytosis.  Lactic acid is also elevated.  She is hemodynamically stable.  I suspect all of the above evidence is inflammatory and secondary to recurrent vomiting and dehydration.  Continue to hydrate with  venous fluid.  Recheck lactic acid.  I have deferred antibiotics for now.  If continued suspicion for septicemia obtain procalcitonin. 3.  Diabetes mellitus type 2: With hyperglycemia; hold oral hypoglycemic agents except Tradjenta.  Sliding scale insulin while hospitalized. 4.  Hypertension: Uncontrolled; partly due to recurrent vomiting.  Continue amlodipine, hydralazine and losartan.  Restart Imdur and metoprolol once verified 5.  Hyperlipidemia: Continue statin therapy 6.  DVT prophylaxis: Lovenox 7.  GI prophylaxis: Pantoprazole per home regimen The patient is a full code.  Time spent on admission orders and patient care approximately 45 minutes  Harrie Foreman, MD 06/25/2019, 7:33 AM

## 2019-06-25 NOTE — Progress Notes (Signed)
Patient was seen and examined.  History and physical, labs and vital signs reviewed.  Agree with Dr. Tinnie Gens H&P.  We will add Lantus 80 units subcu every 24 hours.  GI consult placed.  Cardiology consult placed for cardiac clearance for GI interventions.  Plan of care discussed with the patient patient is aware.  Will monitor lactic acid level

## 2019-06-25 NOTE — ED Notes (Signed)
Pt updated on admission process. Pt verbalizes understanding and denies needs at this time.

## 2019-06-25 NOTE — Consult Note (Signed)
[facility] Cardiology  CARDIOLOGY CONSULT NOTE  Patient ID: Brittney [name] MRN: [id number] DOB/AGE: April 23, 1941 78 y.o.  Admit date: 06/24/2019 Referring Physician [place] Primary Physician Elisabeth Cara, NP Primary Cardiologist [facility] Reason for Consultation Cardiac clearance  HPI: 78 year old female referred for evaluation of preoperative cardiovascular evaluation prior to endoscopy and elevated troponin.  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  The patient presented to [facility] ER yesterday for nausea, vomiting, diarrhea, cough, and epigastric pain.  Abdominal CT scan revealed duodenitis, and an upper endoscopy was recommended for further evaluation.  The patient is also being treated for sepsis.  Admission labs notable for elevated lactate of 4.4, hS-troponin 24 x2, creatinine 1.14, BUN 24, WBC 11.9, hemoglobin 11.5, hematocrit 35.1.  EKG revealed sinus tachycardia at a rate of 108 bpm without acute ST or T wave abnormalities.  The patient denies any recent chest pain.  She has mild exertional shortness of breath.  She has chronic peripheral edema.  She denies palpitations or heart racing.  She denies recent presyncope or syncope.  Chest x-ray yesterday revealed normal heart size, normal pulmonary vascularity, without infiltrate or pleural effusion.  Currently, the patient reports feeling fine, with mild epigastric discomfort, without chest pain or shortness of breath.  Her only complaint is that she wishes to eat.  Review of systems complete and found to be negative unless listed above     Past Medical History:  Diagnosis Date  . Aortic valve stenosis    . Carotid bruit   . CHF (congestive heart failure) ([place])   . Chronic kidney disease   . Diabetes mellitus without complication ([place])   . GERD (gastroesophageal reflux disease)   . Heart murmur   . Hyperlipidemia   . Hypertension   . Pneumonia   . Vitamin D deficiency     Past Surgical History:  Procedure Laterality Date  . COLONOSCOPY    . LEFT HEART CATH AND CORONARY ANGIOGRAPHY N/A 01/23/2019   Procedure: LEFT HEART CATH AND CORONARY ANGIOGRAPHY;  Surgeon: Yolonda Kida, MD;  Location: [place] [facility];  Service: Cardiovascular;  Laterality: N/A;  . ovarian cyst removal      Medications Prior to Admission  Medication Sig Dispense Refill Last Dose  . amLODipine (NORVASC) 10 MG tablet Take 10 mg by mouth daily.   06/24/2019 at 1000  . aspirin 81 MG tablet Take 81 mg by mouth daily.   06/24/2019 at 1000  . cetirizine (ZYRTEC) 10 MG tablet Take 10 mg by mouth daily.    06/24/2019 at 1000  . hydrALAZINE (APRESOLINE) 25 MG tablet Take 25 mg by mouth 3 (three) times daily. Taking twice a day   06/24/2019 at 2000  . Liraglutide (VICTOZA) 18 MG/3ML SOPN Inject 1.8 mg into the skin every morning.   Past Week at Unknown time  . losartan (COZAAR) 100 MG tablet Take 100 mg by mouth daily.   06/24/2019 at 1000  . metFORMIN (GLUCOPHAGE) 1000 MG tablet Take 1,000 mg by mouth 2 (two) times daily with a meal. Just started back. Not sure how many mg   06/24/2019 at 2000  . omeprazole (PRILOSEC) 20 MG capsule Take 20 mg by mouth daily.    at  Unknown time  . ranitidine (ZANTAC) 150 MG tablet Take 1 tablet by mouth 2 (two) times daily.   06/24/2019 at 1000  . sitaGLIPtin (JANUVIA) 100 MG tablet Take 100 mg by mouth daily.   06/24/2019 at 1000  . ALBUTEROL IN Inhale 90 mcg into the lungs every 6 (six) hours as needed (wheezing).   Not Taking at Unknown time  . atorvastatin (LIPITOR) 40 MG tablet Take 1 tablet by mouth daily.   Not Taking at Unknown time  . Choline Fenofibrate 45 MG capsule Take 90 mg by  mouth daily.   Not Taking at Unknown time  . furosemide (LASIX) 40 MG tablet Take 1 tablet (40 mg total) by mouth daily. (Patient not taking: Reported on 06/25/2019) 30 tablet 1 Not Taking at 1000  . glipiZIDE (GLUCOTROL XL) 10 MG 24 hr tablet Take 10 mg by mouth 2 (two) times daily.   Not Taking at Unknown time  . isosorbide mononitrate (IMDUR) 30 MG 24 hr tablet Take 30 mg by mouth daily.   Not Taking at Unknown time  . metoprolol tartrate (LOPRESSOR) 50 MG tablet Take 1 tablet (50 mg total) by mouth 2 (two) times daily. (Patient not taking: Reported on 06/25/2019) 60 tablet 1 Not Taking at Unknown time   Social History   Socioeconomic History  . Marital status: Widowed    Spouse name: Not on file  . Number of children: Not on file  . Years of education: Not on file  . Highest education level: Not on file  Occupational History  . Not on file  Social Needs  . Financial resource strain: Not on file  . Food insecurity    Worry: Not on file    Inability: Not on file  . Transportation needs    Medical: Not on file    Non-medical: Not on file  Tobacco Use  . Smoking status: Former Smoker    Packs/day: 1.00    Years: 40.00    Pack years: 40.00    Types: Cigarettes    Quit date: 07/27/2018    Years since quitting: 0.9  . Smokeless tobacco: Never Used  Substance and Sexual Activity  . Alcohol use: No  . Drug use: No  . Sexual activity: Not on file  Lifestyle  . Physical activity    Days per week: Not on file    Minutes per session: Not on file  . Stress: Not on file  Relationships  . Social Herbalist on phone: Not on file    Gets together: Not on file    Attends religious service: Not on file    Active member of club or organization: Not on file    Attends meetings of clubs or organizations: Not on file    Relationship status: Not on file  . Intimate partner violence    Fear of current or ex partner: Not on file    Emotionally abused: Not on file    Physically  abused: Not on file    Forced sexual activity: Not on file  Other Topics Concern  . Not on file  Social History Narrative  . Not on file    Family History  Problem Relation Age of Onset  . Diabetes Neg Hx   . Hypertension Neg Hx   . Breast cancer Neg Hx       Review of systems complete and found to be negative unless listed above      PHYSICAL EXAM  General:  Well developed, well nourished, in no acute distress, lying in bed HEENT:  Normocephalic and atramatic Neck:  No JVD.  Lungs: Clear bilaterally to auscultation, normal effort of breathing on room air. Heart: HRRR . Normal S1 and S2 without gallops or murmurs.  Abdomen: nondistended Msk:  Back normal, gait not assessed. No obvious abnormality Extremities: No clubbing, cyanosis. Trace bilateral pedal edema.   Neuro: Alert and oriented X 3. Psych:  Good affect, responds appropriately  Labs:   Lab Results  Component Value Date   WBC 11.9 (H) 06/24/2019   HGB 11.5 (L) 06/24/2019   HCT 35.1 (L) 06/24/2019   MCV 80.3 06/24/2019   PLT 424 (H) 06/24/2019    Recent Labs  Lab 06/24/19 1750  NA 135  K 4.2  CL 98  CO2 20*  BUN 23  CREATININE 1.14*  CALCIUM 10.3  PROT 7.4  BILITOT 0.9  ALKPHOS 58  ALT 21  AST 21  GLUCOSE 428*   Lab Results  Component Value Date   TROPONINI 0.28 (HH) 01/02/2019   No results found for: CHOL No results found for: HDL No results found for: LDLCALC No results found for: TRIG No results found for: CHOLHDL No results found for: LDLDIRECT    Radiology: Dg Chest 2 View  Result Date: 06/24/2019 CLINICAL DATA:  Chest pain, epigastric pain, nausea, vomiting, and diarrhea beginning this morning, recently tested and is COVID-19 negative EXAM: CHEST - 2 VIEW COMPARISON:  01/02/2019 FINDINGS: Normal heart size, mediastinal contours, and pulmonary vascularity. Atherosclerotic calcification aorta. Lungs clear. No infiltrate, pleural effusion or pneumothorax. Bones demineralized.  IMPRESSION: No acute abnormalities. Electronically Signed   By: Lavonia Dana M.D.   On: 06/24/2019 20:20   Ct Abdomen Pelvis W Contrast  Result Date: 06/24/2019 CLINICAL DATA:  78 y/o F; epigastric pain, nausea, vomiting, diarrhea. EXAM: CT ABDOMEN AND PELVIS WITH CONTRAST TECHNIQUE: Multidetector CT imaging of the abdomen and pelvis was performed using the standard protocol following bolus administration of intravenous contrast. CONTRAST:  143mL OMNIPAQUE IOHEXOL 300 MG/ML  SOLN COMPARISON:  08/05/2018 CT abdomen and pelvis. FINDINGS: Lower chest: No acute abnormality. Hepatobiliary: No focal liver abnormality is seen. Cholelithiasis. No pericholecystic fluid collection, gallbladder wall thickening, or biliary dilatation. Pancreas: Unremarkable. No pancreatic ductal dilatation or surrounding inflammatory changes. Spleen: Normal in size without focal abnormality. Adrenals/Urinary Tract: Adrenal glands are unremarkable. Small renal cysts bilaterally. Otherwise kidneys are normal, without renal calculi, focal lesion, or hydronephrosis. Bladder is unremarkable. Stomach/Bowel: Severe wall thickening and surrounding edema centered in the proximal duodenum. No extraluminal air to indicate perforation. Appendix not identified, no pericecal inflammation. Otherwise no evidence of bowel wall thickening, distention, or inflammatory changes. Vascular/Lymphatic: Aortic atherosclerosis. No enlarged abdominal or pelvic lymph nodes. Reproductive: Uterus and bilateral adnexa are unremarkable. Other: No abdominal wall hernia or abnormality. No abdominopelvic ascites. Musculoskeletal: No fracture is seen. Lumbar spondylosis with advanced facet arthropathy and L4-5 grade 1 anterolisthesis. IMPRESSION: 1. Severe wall thickening and surrounding edema centered in the proximal duodenum probably representing underlying peptic ulcer disease or possibly infectious/inflammatory duodenitis. No findings of perforation or abscess. 2.  Cholelithiasis. 3. Aortic Atherosclerosis (ICD10-I70.0). Electronically Signed   By: Kristine Garbe M.D.   On: 06/24/2019 23:41    EKG: sinus rhythm with PACs, rate 80s  ASSESSMENT AND PLAN:  1. Preoperative cardiovascular evaluation prior to endoscopy for evaluation of duodenitis; the patient has coronary artery disease, has thus far been minimally symptomatic and medically optimized.  2. Borderline elevated troponin of 24  x 2, without delta, without chest pain, without ECG changes, likely secondary to sepsis 3. Sepsis in the setting of duodenitis, receiving IV antibiotics and IV fluids 4. Hypertension, blood pressure elevated. Patient reports she had been taking metoprolol, but ran out about a week ago. Also taking amlodipine, losartan, hydralazine  Plan: 1.  Resume metoprolol tartrate 50 mg twice daily.  May uptitrate to home dose of 75 mg twice daily if needed. Continue metoprolol pre-, peri-, and post-procedurally.  2. Proceed with endoscopy if deemed medically necessary. The patient is considered to be a low but acceptable risk for serious cardiovascular complications when undergoing endoscopy.  3. Avoid over-diuresing in the setting of CHF; monitor I&Os 4. No further cardiac diagnostics recommended at this time.  Signed: Clabe Seal PA-C 06/25/2019, 1:31 PM  Discussed with Dr. Saralyn Pilar who agreed with the above plan.

## 2019-06-25 NOTE — Consult Note (Signed)
Brittney Antigua, MD [address], [place], [place], [place], [address] [address], [place], [place], [place], [address] Phone: [phone]  Fax: [phone]  Consultation  Referring Provider:     Dr. Margaretmary Eddy Primary Care Physician:  Elisabeth Cara, NP Reason for Consultation:     Duodenitis  Date of Admission:  06/24/2019 Date of Consultation:  06/25/2019         HPI:   Brittney [name] is a 78 y.o. female presented with nausea vomiting, abdominal pain and diarrhea.  No blood in emesis or in stool.  GI being consulted due to finding of duodenitis on CT scan.  Patient is being treated for sepsis with fluid resuscitation.  Elevated lactate on admission.  Also elevated troponin.  She denies any abdominal pain, nausea vomiting diarrhea today.  Hemoglobin of 11.5 on admission.  Patient is on aspirin at home.  Denies any melena, hematochezia, or any other source of bleeding.  There is a remote history of upper endoscopy of which we do not have the records.  As per Dr. Verlin Grills consult note in 2019, patient has history of remote gastric ulcers.  Patient was previously seen by [facility] clinic GI, Dr. Candace Cruise, and a colonoscopy for screening was planned.  Procedure report not available. Patient also had a 2008 colonoscopy with 2, 8 mm polyps removed, one showed tubular adenoma  Past Medical History:  Diagnosis Date  . Aortic valve stenosis   . Carotid bruit   . CHF (congestive heart failure) ([place])   . Chronic kidney disease   . Diabetes mellitus without complication ([place])   . GERD (gastroesophageal reflux disease)   . Heart murmur   . Hyperlipidemia   . Hypertension   . Pneumonia   . Vitamin D deficiency     Past Surgical History:  Procedure Laterality Date  . COLONOSCOPY    . LEFT HEART CATH AND CORONARY ANGIOGRAPHY N/A 01/23/2019   Procedure: LEFT HEART CATH AND CORONARY ANGIOGRAPHY;  Surgeon: Yolonda Kida, MD;  Location: [facility];  Service:  Cardiovascular;  Laterality: N/A;  . ovarian cyst removal      Prior to Admission medications   Medication Sig Start Date End Date Taking? Authorizing Provider  amLODipine (NORVASC) 10 MG tablet Take 10 mg by mouth daily.   Yes [provider]  aspirin 81 MG tablet Take 81 mg by mouth daily.   Yes [provider]  cetirizine (ZYRTEC) 10 MG tablet Take 10 mg by mouth daily.    Yes [provider]  hydrALAZINE (APRESOLINE) 25 MG tablet Take 25 mg by mouth 3 (three) times daily. Taking twice a day   Yes [provider]  Liraglutide (VICTOZA) 18 MG/3ML SOPN Inject 1.8 mg into the skin every morning.   Yes [provider]  losartan (COZAAR) 100 MG tablet Take 100 mg by mouth daily.   Yes [provider]  metFORMIN (GLUCOPHAGE) 1000 MG tablet Take 1,000 mg by mouth 2 (two) times daily with a meal. Just started back. Not sure how many mg   Yes [provider]  omeprazole (PRILOSEC) 20 MG capsule Take 20 mg by mouth daily.   Yes [provider]  ranitidine (ZANTAC) 150 MG tablet Take 1 tablet by mouth 2 (two) times daily. 05/06/18  Yes [provider]  sitaGLIPtin (JANUVIA) 100 MG tablet Take 100 mg by mouth daily.   Yes [provider]  ALBUTEROL IN Inhale 90 mcg into the lungs every  6 (six) hours as needed (wheezing).    [provider]  atorvastatin (LIPITOR) 40 MG tablet Take 1 tablet by mouth daily. 07/04/18   [provider]  Choline Fenofibrate 45 MG capsule Take 90 mg by mouth daily.    [provider]  furosemide (LASIX) 40 MG tablet Take 1 tablet (40 mg total) by mouth daily. Patient not taking: Reported on 06/25/2019 01/07/19   [name], MD  glipiZIDE (GLUCOTROL XL) 10 MG 24 hr tablet Take 10 mg by mouth 2 (two) times daily.    [provider]  isosorbide mononitrate (IMDUR) 30 MG 24 hr tablet Take 30 mg by mouth daily.    [provider]  metoprolol tartrate  (LOPRESSOR) 50 MG tablet Take 1 tablet (50 mg total) by mouth 2 (two) times daily. Patient not taking: Reported on 06/25/2019 08/11/18 02/26/19  [name], MD    Family History  Problem Relation Age of Onset  . Diabetes Neg Hx   . Hypertension Neg Hx   . Breast cancer Neg Hx      Social History   Tobacco Use  . Smoking status: Former Smoker    Packs/day: 1.00    Years: 40.00    Pack years: 40.00    Types: Cigarettes    Quit date: 07/27/2018    Years since quitting: 0.9  . Smokeless tobacco: Never Used  Substance Use Topics  . Alcohol use: No  . Drug use: No    Allergies as of 06/24/2019 - Review Complete 06/24/2019  Allergen Reaction Noted  . Accupril [quinapril hcl]  08/01/2015    Review of Systems:    All systems reviewed and negative except where noted in HPI.   Physical Exam:  Vital signs in last 24 hours: Vitals:   06/25/19 0400 06/25/19 0430 06/25/19 0547 06/25/19 0755  BP:   (!) 170/67 (!) 167/81  Pulse:   95   Resp: (!) 21 (!) 21 18 18   Temp:   99.1 F (37.3 C) 98.9 F (37.2 C)  TempSrc:   Oral Oral  SpO2:   97% 96%  Weight:      Height:       Last BM Date: 06/24/19 General:   Pleasant, cooperative in NAD Head:  Normocephalic and atraumatic. Eyes:   No icterus.   Conjunctiva pink. PERRLA. Ears:  Normal auditory acuity. Neck:  Supple; no masses or thyroidomegaly Lungs: Respirations even and unlabored. Lungs clear to auscultation bilaterally.   No wheezes, crackles, or rhonchi.  Abdomen:  Soft, nondistended, nontender. Normal bowel sounds. No appreciable masses or hepatomegaly.  No rebound or guarding.  Neurologic:  Alert and oriented x3;  grossly normal neurologically. Skin:  Intact without significant lesions or rashes. Cervical Nodes:  No significant cervical adenopathy. Psych:  Alert and cooperative. Normal affect.  LAB RESULTS: Recent Labs    06/24/19 1750  WBC 11.9*  HGB 11.5*  HCT 35.1*  PLT 424*   BMET Recent Labs    06/24/19  1750  NA 135  K 4.2  CL 98  CO2 20*  GLUCOSE 428*  BUN 23  CREATININE 1.14*  CALCIUM 10.3   LFT Recent Labs    06/24/19 1750  PROT 7.4  ALBUMIN 3.7  AST 21  ALT 21  ALKPHOS 58  BILITOT 0.9   PT/INR No results for input(s): LABPROT, INR in the last 72 hours.  STUDIES: Dg Chest 2 View  Result Date: 06/24/2019 CLINICAL DATA:  Chest pain, epigastric pain, nausea,  vomiting, and diarrhea beginning this morning, recently tested and is COVID-19 negative EXAM: CHEST - 2 VIEW COMPARISON:  01/02/2019 FINDINGS: Normal heart size, mediastinal contours, and pulmonary vascularity. Atherosclerotic calcification aorta. Lungs clear. No infiltrate, pleural effusion or pneumothorax. Bones demineralized. IMPRESSION: No acute abnormalities. Electronically Signed   By: Lavonia Dana M.D.   On: 06/24/2019 20:20   Ct Abdomen Pelvis W Contrast  Result Date: 06/24/2019 CLINICAL DATA:  79 y/o F; epigastric pain, nausea, vomiting, diarrhea. EXAM: CT ABDOMEN AND PELVIS WITH CONTRAST TECHNIQUE: Multidetector CT imaging of the abdomen and pelvis was performed using the standard protocol following bolus administration of intravenous contrast. CONTRAST:  131mL OMNIPAQUE IOHEXOL 300 MG/ML  SOLN COMPARISON:  08/05/2018 CT abdomen and pelvis. FINDINGS: Lower chest: No acute abnormality. Hepatobiliary: No focal liver abnormality is seen. Cholelithiasis. No pericholecystic fluid collection, gallbladder wall thickening, or biliary dilatation. Pancreas: Unremarkable. No pancreatic ductal dilatation or surrounding inflammatory changes. Spleen: Normal in size without focal abnormality. Adrenals/Urinary Tract: Adrenal glands are unremarkable. Small renal cysts bilaterally. Otherwise kidneys are normal, without renal calculi, focal lesion, or hydronephrosis. Bladder is unremarkable. Stomach/Bowel: Severe wall thickening and surrounding edema centered in the proximal duodenum. No extraluminal air to indicate perforation. Appendix  not identified, no pericecal inflammation. Otherwise no evidence of bowel wall thickening, distention, or inflammatory changes. Vascular/Lymphatic: Aortic atherosclerosis. No enlarged abdominal or pelvic lymph nodes. Reproductive: Uterus and bilateral adnexa are unremarkable. Other: No abdominal wall hernia or abnormality. No abdominopelvic ascites. Musculoskeletal: No fracture is seen. Lumbar spondylosis with advanced facet arthropathy and L4-5 grade 1 anterolisthesis. IMPRESSION: 1. Severe wall thickening and surrounding edema centered in the proximal duodenum probably representing underlying peptic ulcer disease or possibly infectious/inflammatory duodenitis. No findings of perforation or abscess. 2. Cholelithiasis. 3. Aortic Atherosclerosis (ICD10-I70.0). Electronically Signed   By: Kristine Garbe M.D.   On: 06/24/2019 23:41      Impression / Plan:   Brittney [name] is a 78 y.o. y/o female with nausea, vomiting, diarrhea on presentation, admitted with sepsis, with GI consulted for duodenitis on CT scan  Upper endoscopy indicated to evaluate the abnormality seen on CT scan  Possible peptic ulcer disease versus inflammation in the area on the differential  Patient reports daily Aleve use for years. Avoid NSAID use such as Ibuprofen, Aleeve, advil, motrin, BC and Goodie powder, Naproxen, Meloxicam and others.   No signs of active GI bleeding at this time No indication for emergent endoscopy  Medical optimization with IV fluid resuscitation as per primary team.  Lactate is elevated.  It would be best to obtain cardiac clearance given elevated troponin, and allow for medical optimization before endoscopy  Timing of upper endoscopy to be determined based on above  I have discussed alternative options, risks & benefits,  which include, but are not limited to, bleeding, infection, perforation,respiratory complication & drug reaction.  The patient agrees with this plan & written consent  will be obtained.    Upper endoscopy possibly tomorrow N.p.o. past midnight  Continue PPI IV twice daily  Thank you for involving me in the care of this patient.      LOS: 0 days   Virgel Manifold, MD  06/25/2019, 9:11 AM

## 2019-06-25 NOTE — ED Notes (Signed)
Pt assisted up to commode to void.  

## 2019-06-25 NOTE — ED Notes (Signed)
ED TO INPATIENT HANDOFF REPORT  ED Nurse Name and Phone #: Chevi Lim [phone]   S Name/Age/Gender Brittney [name] 78 y.o. female Room/Bed: ED05A/ED05A  Code Status   Code Status: Prior  Home/SNF/Other Home Patient oriented to: self, place, time and situation Is this baseline? Yes   Triage Complete: Triage complete  Chief Complaint Epigastric Pain  Triage Note Pt to ED by EMS from home with c/o of Epigastric Pain with N/V/D that started this morning at approx 3:30. Pt was recently tested for COVID-19 and received negative results on Friday.    Allergies Allergies  Allergen Reactions  . Accupril [Quinapril Hcl]     Level of Care/Admitting Diagnosis ED Disposition    ED Disposition Condition [facility]: [place] [[id number]]  Level of Care: Med-Surg [16]  Covid Evaluation: Confirmed COVID Negative  Diagnosis: Duodenitis [[id number]]  Admitting Physician: Harrie Foreman [[id number]]  Attending Physician: Harrie Foreman [[id number]]  Estimated length of stay: past midnight tomorrow  Certification:: I certify this patient will need inpatient services for at least 2 midnights  PT Class (Do Not Modify): Inpatient [101]  PT Acc Code (Do Not Modify): Private [1]       B Medical/Surgery History Past Medical History:  Diagnosis Date  . Aortic valve stenosis   . Carotid bruit   . CHF (congestive heart failure) ([place])   . Chronic kidney disease   . Diabetes mellitus without complication ([place])   . GERD (gastroesophageal reflux disease)   . Heart murmur   . Hyperlipidemia   . Hypertension   . Pneumonia   . Vitamin D deficiency    Past Surgical History:  Procedure Laterality Date  . COLONOSCOPY    . LEFT HEART CATH AND CORONARY ANGIOGRAPHY N/A 01/23/2019   Procedure: LEFT HEART CATH AND CORONARY ANGIOGRAPHY;  Surgeon: Yolonda Kida, MD;  Location: [facility];  Service: Cardiovascular;  Laterality: N/A;  . ovarian cyst  removal       A IV Location/Drains/Wounds Patient Lines/Drains/Airways Status   Active Line/Drains/Airways    Name:   Placement date:   Placement time:   Site:   Days:   Peripheral IV 06/24/19 Left Antecubital   06/24/19    -    Antecubital   1          Intake/Output Last 24 hours  Intake/Output Summary (Last 24 hours) at 06/25/2019 0432 Last data filed at 06/25/2019 0144 Gross per 24 hour  Intake 2100 ml  Output -  Net 2100 ml    Labs/Imaging Results for orders placed or performed during the hospital encounter of 06/24/19 (from the past 48 hour(s))  CBC with Differential     Status: Abnormal   Collection Time: 06/24/19  5:50 PM  Result Value Ref Range   WBC 11.9 (H) 4.0 - 10.5 K/uL   RBC 4.37 3.87 - 5.11 MIL/uL   Hemoglobin 11.5 (L) 12.0 - 15.0 g/dL   HCT 35.1 (L) 36.0 - 46.0 %   MCV 80.3 80.0 - 100.0 fL   MCH 26.3 26.0 - 34.0 pg   MCHC 32.8 30.0 - 36.0 g/dL   RDW 14.6 11.5 - 15.5 %   Platelets 424 (H) 150 - 400 K/uL   nRBC 0.0 0.0 - 0.2 %   Neutrophils Relative % 78 %   Neutro Abs 9.4 (H) 1.7 - 7.7 K/uL   Lymphocytes Relative 14 %   Lymphs Abs 1.7 0.7 - 4.0 K/uL  Monocytes Relative 6 %   Monocytes Absolute 0.7 0.1 - 1.0 K/uL   Eosinophils Relative 0 %   Eosinophils Absolute 0.0 0.0 - 0.5 K/uL   Basophils Relative 1 %   Basophils Absolute 0.1 0.0 - 0.1 K/uL   Immature Granulocytes 1 %   Abs Immature Granulocytes 0.09 (H) 0.00 - 0.07 K/uL    Comment: Performed at [facility], [address] [place], [place] [address]  Comprehensive metabolic panel     Status: Abnormal   Collection Time: 06/24/19  5:50 PM  Result Value Ref Range   Sodium 135 135 - 145 mmol/L   Potassium 4.2 3.5 - 5.1 mmol/L   Chloride 98 98 - 111 mmol/L   CO2 20 (L) 22 - 32 mmol/L   Glucose, Bld 428 (H) 70 - 99 mg/dL   BUN 23 8 - 23 mg/dL   Creatinine, Ser 1.14 (H) 0.44 - 1.00 mg/dL   Calcium 10.3 8.9 - 10.3 mg/dL   Total Protein 7.4 6.5 - 8.1 g/dL   Albumin 3.7 3.5 - 5.0 g/dL    AST 21 15 - 41 U/L   ALT 21 0 - 44 U/L   Alkaline Phosphatase 58 38 - 126 U/L   Total Bilirubin 0.9 0.3 - 1.2 mg/dL   GFR calc non Af Amer 46 (L) >60 mL/min   GFR calc Af Amer 53 (L) >60 mL/min   Anion gap 17 (H) 5 - 15    Comment: Performed at [facility], [address] [place], [place] [address]  Lipase, blood     Status: None   Collection Time: 06/24/19  5:50 PM  Result Value Ref Range   Lipase 41 11 - 51 U/L    Comment: Performed at [facility], [place], [place] [address]  Troponin I (High Sensitivity)     Status: Abnormal   Collection Time: 06/24/19  5:50 PM  Result Value Ref Range   Troponin I (High Sensitivity) 24 (H) <18 ng/L    Comment: (NOTE) Elevated high sensitivity troponin I (hsTnI) values and significant  changes across serial measurements may suggest ACS but many other  chronic and acute conditions are known to elevate hsTnI results.  Refer to the "Links" section for chest pain algorithms and additional  guidance. Performed at [facility], [place], [place] [address]   Troponin I (High Sensitivity)     Status: Abnormal   Collection Time: 06/24/19  8:17 PM  Result Value Ref Range   Troponin I (High Sensitivity) 24 (H) <18 ng/L    Comment: (NOTE) Elevated high sensitivity troponin I (hsTnI) values and significant  changes across serial measurements may suggest ACS but many other  chronic and acute conditions are known to elevate hsTnI results.  Refer to the "Links" section for chest pain algorithms and additional  guidance. Performed at [facility], [address] [place], [place] [address]   Lactic acid, plasma     Status: Abnormal   Collection Time: 06/24/19 11:55 PM  Result Value Ref Range   Lactic Acid, Venous 2.7 (HH) 0.5 - 1.9 mmol/L    Comment: CRITICAL RESULT CALLED TO, READ BACK BY AND VERIFIED WITH Ziyan Schoon AT 0037 ON 06/25/19 RWW Performed at [facility], [address], [place],  [address]   SARS Coronavirus 2 (CEPHEID - Performed in [facility] hospital lab), [facility] Order     Status: None   Collection Time: 06/25/19 12:01  AM   Specimen: Nasopharyngeal Swab  Result Value Ref Range   SARS Coronavirus 2 NEGATIVE NEGATIVE    Comment: (NOTE) If result is NEGATIVE SARS-CoV-2 target nucleic acids are NOT DETECTED. The SARS-CoV-2 RNA is generally detectable in upper and lower  respiratory specimens during the acute phase of infection. The lowest  concentration of SARS-CoV-2 viral copies this assay can detect is 250  copies / mL. A negative result does not preclude SARS-CoV-2 infection  and should not be used as the sole basis for treatment or other  patient management decisions.  A negative result may occur with  improper specimen collection / handling, submission of specimen other  than nasopharyngeal swab, presence of viral mutation(s) within the  areas targeted by this assay, and inadequate number of viral copies  (<250 copies / mL). A negative result must be combined with clinical  observations, patient history, and epidemiological information. If result is POSITIVE SARS-CoV-2 target nucleic acids are DETECTED. The SARS-CoV-2 RNA is generally detectable in upper and lower  respiratory specimens dur ing the acute phase of infection.  Positive  results are indicative of active infection with SARS-CoV-2.  Clinical  correlation with patient history and other diagnostic information is  necessary to determine patient infection status.  Positive results do  not rule out bacterial infection or co-infection with other viruses. If result is PRESUMPTIVE POSTIVE SARS-CoV-2 nucleic acids MAY BE PRESENT.   A presumptive positive result was obtained on the submitted specimen  and confirmed on repeat testing.  While 2019 novel coronavirus  (SARS-CoV-2) nucleic acids may be present in the submitted sample  additional confirmatory testing  may be necessary for epidemiological  and / or clinical management purposes  to differentiate between  SARS-CoV-2 and other Sarbecovirus currently known to infect humans.  If clinically indicated additional testing with an alternate test  methodology [phone]) is advised. The SARS-CoV-2 RNA is generally  detectable in upper and lower respiratory sp ecimens during the acute  phase of infection. The expected result is Negative. Fact Sheet for Patients:  StrictlyIdeas.no Fact Sheet for Healthcare Providers: BankingDealers.co.za This test is not yet approved or cleared by the [place] FDA and has been authorized for detection and/or diagnosis of SARS-CoV-2 by FDA under an Emergency Use Authorization (EUA).  This EUA will remain in effect (meaning this test can be used) for the duration of the COVID-19 declaration under Section 564(b)(1) of the Act, 21 U.S.C. section 360bbb-3(b)(1), unless the authorization is terminated or revoked sooner. Performed at [facility], [address] [place], [place] [address]   Culture, blood (routine x 2)     Status: None (Preliminary result)   Collection Time: 06/25/19 12:55 AM   Specimen: BLOOD  Result Value Ref Range   Specimen Description BLOOD RIGHT AC    Special Requests      BOTTLES DRAWN AEROBIC AND ANAEROBIC Blood Culture adequate volume   Culture      NO GROWTH < 12 HOURS Performed at [facility], [address], [place], [place] [address]    Report Status PENDING   Culture, blood (routine x 2)     Status: None (Preliminary result)   Collection Time: 06/25/19  1:06 AM   Specimen: BLOOD  Result Value Ref Range   Specimen Description BLOOD RIGHT AC    Special Requests      BOTTLES DRAWN AEROBIC AND ANAEROBIC Blood Culture adequate volume   Culture      NO GROWTH < 12 HOURS  Performed at [facility], [address] [place], [place] [address]    Report  Status PENDING   Urinalysis, Complete w Microscopic     Status: Abnormal   Collection Time: 06/25/19  1:45 AM  Result Value Ref Range   Color, Urine STRAW (A) YELLOW   APPearance CLEAR (A) CLEAR   Specific Gravity, Urine 1.038 (H) 1.005 - 1.030   pH 6.0 5.0 - 8.0   Glucose, UA >=500 (A) NEGATIVE mg/dL   Hgb urine dipstick NEGATIVE NEGATIVE   Bilirubin Urine NEGATIVE NEGATIVE   Ketones, ur NEGATIVE NEGATIVE mg/dL   Protein, ur 100 (A) NEGATIVE mg/dL   Nitrite NEGATIVE NEGATIVE   Leukocytes,Ua NEGATIVE NEGATIVE   RBC / HPF 0-5 0 - 5 RBC/hpf   WBC, UA 0-5 0 - 5 WBC/hpf   Bacteria, UA NONE SEEN NONE SEEN   Squamous Epithelial / LPF 0-5 0 - 5    Comment: Performed at [facility], [address] [place], [place] [address]  Lactic acid, plasma     Status: Abnormal   Collection Time: 06/25/19  2:33 AM  Result Value Ref Range   Lactic Acid, Venous 4.4 (HH) 0.5 - 1.9 mmol/L    Comment: CRITICAL RESULT CALLED TO, READ BACK BY AND VERIFIED WITH Acea Yagi BRUMGUARD AT 0307 ON 06/25/2019 JJB Performed at [facility], [address] [place], [place] [address]    Dg Chest 2 View  Result Date: 06/24/2019 CLINICAL DATA:  Chest pain, epigastric pain, nausea, vomiting, and diarrhea beginning this morning, recently tested and is COVID-19 negative EXAM: CHEST - 2 VIEW COMPARISON:  01/02/2019 FINDINGS: Normal heart size, mediastinal contours, and pulmonary vascularity. Atherosclerotic calcification aorta. Lungs clear. No infiltrate, pleural effusion or pneumothorax. Bones demineralized. IMPRESSION: No acute abnormalities. Electronically Signed   By: Lavonia Dana M.D.   On: 06/24/2019 20:20   Ct Abdomen Pelvis W Contrast  Result Date: 06/24/2019 CLINICAL DATA:  78 y/o F; epigastric pain, nausea, vomiting, diarrhea. EXAM: CT ABDOMEN AND PELVIS WITH CONTRAST TECHNIQUE: Multidetector CT imaging of the abdomen and pelvis was performed using the standard protocol following bolus  administration of intravenous contrast. CONTRAST:  142mL OMNIPAQUE IOHEXOL 300 MG/ML  SOLN COMPARISON:  08/05/2018 CT abdomen and pelvis. FINDINGS: Lower chest: No acute abnormality. Hepatobiliary: No focal liver abnormality is seen. Cholelithiasis. No pericholecystic fluid collection, gallbladder wall thickening, or biliary dilatation. Pancreas: Unremarkable. No pancreatic ductal dilatation or surrounding inflammatory changes. Spleen: Normal in size without focal abnormality. Adrenals/Urinary Tract: Adrenal glands are unremarkable. Small renal cysts bilaterally. Otherwise kidneys are normal, without renal calculi, focal lesion, or hydronephrosis. Bladder is unremarkable. Stomach/Bowel: Severe wall thickening and surrounding edema centered in the proximal duodenum. No extraluminal air to indicate perforation. Appendix not identified, no pericecal inflammation. Otherwise no evidence of bowel wall thickening, distention, or inflammatory changes. Vascular/Lymphatic: Aortic atherosclerosis. No enlarged abdominal or pelvic lymph nodes. Reproductive: Uterus and bilateral adnexa are unremarkable. Other: No abdominal wall hernia or abnormality. No abdominopelvic ascites. Musculoskeletal: No fracture is seen. Lumbar spondylosis with advanced facet arthropathy and L4-5 grade 1 anterolisthesis. IMPRESSION: 1. Severe wall thickening and surrounding edema centered in the proximal duodenum probably representing underlying peptic ulcer disease or possibly infectious/inflammatory duodenitis. No findings of perforation or abscess. 2. Cholelithiasis. 3. Aortic Atherosclerosis (ICD10-I70.0). Electronically Signed   By: Kristine Garbe M.D.   On: 06/24/2019 23:41    Pending Labs Unresulted Labs (From admission, onward)    Start     Ordered   06/25/19  0038  Urine culture  Once,   STAT     06/25/19 0037   Signed and Held  Creatinine, serum  (enoxaparin (LOVENOX)    CrCl >/= 30 ml/min)  Weekly,   R    Comments: while on  enoxaparin therapy    Signed and Held   Signed and Held  TSH  Add-on,   R     Signed and Held   Signed and Held  Hemoglobin A1c  Add-on,   R     Signed and Held          Vitals/Pain Today's Vitals   06/25/19 0310 06/25/19 0315 06/25/19 0330 06/25/19 0422  BP:   (!) 169/81   Pulse:      Resp:  (!) 23    Temp:      SpO2:      Weight:      Height:      PainSc: Asleep  Asleep Asleep    Isolation Precautions No active isolations  Medications Medications  iohexol (OMNIPAQUE) 240 MG/ML injection 50 mL (has no administration in time range)  fentaNYL (SUBLIMAZE) injection 25 mcg (25 mcg Intravenous Refused 06/25/19 0011)  sodium chloride 0.9 % bolus 1,000 mL (0 mLs Intravenous Stopped 06/24/19 2334)  ondansetron (ZOFRAN) injection 4 mg (4 mg Intravenous Given 06/24/19 2202)  iohexol (OMNIPAQUE) 300 MG/ML solution 100 mL (100 mLs Intravenous Contrast Given 06/24/19 2310)  0.9 %  sodium chloride infusion ( Intravenous New Bag/Given 06/25/19 0130)  famotidine (PEPCID) IVPB 20 mg premix (0 mg Intravenous Stopped 06/25/19 0023)  pantoprazole (PROTONIX) injection 40 mg (40 mg Intravenous Given 06/24/19 2353)  sodium chloride 0.9 % bolus 1,000 mL (0 mLs Intravenous Stopped 06/25/19 0110)  insulin aspart (novoLOG) injection 7 Units (7 Units Subcutaneous Given 06/24/19 2354)  alum & mag hydroxide-simeth (MAALOX/MYLANTA) 200-200-20 MG/5ML suspension 30 mL (30 mLs Oral Given 06/25/19 0106)    And  lidocaine (XYLOCAINE) 2 % viscous mouth solution 15 mL (15 mLs Oral Given 06/25/19 0106)  piperacillin-tazobactam (ZOSYN) IVPB 3.375 g (0 g Intravenous Stopped 06/25/19 0144)    Mobility walks Low fall risk   Focused Assessments Cardiac Assessment Handoff:  Cardiac Rhythm: Sinus tachycardia Lab Results  Component Value Date   TROPONINI 0.28 (HH) 01/02/2019   No results found for: DDIMER Does the Patient currently have chest pain? No     R Recommendations: See Admitting Provider  Note  Report given to:   Additional Notes:

## 2019-06-25 NOTE — ED Notes (Signed)
Attempt to call report to floor, rn not available.

## 2019-06-25 NOTE — Consult Note (Signed)
Pharmacy Antibiotic Note  Brittney [name] is a 78 y.o. female admitted on 06/24/2019 with duodenitis.  Pharmacy has been consulted for Zosyn dosing. Her renal function is at what appears to be her baseline.  Plan: Zosyn 3.375g IV q8h (4 hour infusion).  Height: 5\' 6"  (167.6 cm) Weight: 170 lb (77.1 kg) IBW/kg (Calculated) : 59.3  Temp (24hrs), Avg:98.6 F (37 C), Min:97.7 F (36.5 C), Max:99.1 F (37.3 C)  Recent Labs  Lab 06/24/19 1750 06/24/19 2355 06/25/19 0233  WBC 11.9*  --   --   CREATININE 1.14*  --   --   LATICACIDVEN  --  2.7* 4.4*    Estimated Creatinine Clearance: 42.6 mL/min (A) (by C-G formula based on SCr of 1.14 mg/dL (H)).    Antimicrobials this admission: Zosyn 6/29 >>   Microbiology results: 6/29 BCx: pending 6/29 UCx: pending  6/29 SARS CoV-2: negative   Thank you for allowing pharmacy to be a part of this patient's care.  Dallie Piles, PharmD 06/25/2019 9:00 AM

## 2019-06-25 NOTE — Progress Notes (Signed)
Family Meeting Note  Advance Directive:yes  Today a meeting took place with the Patient.    The following clinical team members were present during this meeting:MD  The following were discussed:Patient's diagnosis: acute duodenitis, sepsis, hypertension, chronic kidney disease and diabetes mellitus is admitted to the hospital treatment plan of care discussed in detail with the patient.  He verbalized understanding   patient's progosis: Unable to determine and Goals for treatment: Full Code  Additional follow-up to be provided: Hospitalist, gastroenterology, cardiology  Time spent during discussion:17 min  Nicholes Mango, MD

## 2019-06-25 NOTE — ED Notes (Signed)
Lab here for venipuncture assist.  

## 2019-06-25 NOTE — ED Notes (Signed)
Lab called for venipuncture assist for second set of blood cultures.

## 2019-06-25 NOTE — Progress Notes (Signed)
Inpatient Diabetes Program Recommendations  AACE/ADA: New Consensus Statement on Inpatient Glycemic Control   Target Ranges:  Prepandial:   less than 140 mg/dL      Peak postprandial:   less than 180 mg/dL (1-2 hours)      Critically ill patients:  140 - 180 mg/dL   Results for BULAR, HICKOK (MRN [id number]) as of 06/25/2019 11:32  Ref. Range 06/25/2019 07:51  Glucose-Capillary Latest Ref Range: 70 - 99 mg/dL 331 (H)   Results for RASHAUN, CURL (MRN [id number]) as of 06/25/2019 11:32  Ref. Range 06/24/2019 17:50  Glucose Latest Ref Range: 70 - 99 mg/dL 428 (H)   Results for DANITA, PROUD (MRN [id number]) as of 06/25/2019 11:32  Ref. Range 06/24/2019 20:17  Hemoglobin A1C Latest Ref Range: 4.8 - 5.6 % 10.5 (H)   Review of Glycemic Control  Diabetes history: DM2 Outpatient Diabetes medications: Victoza 1.8 mg daily, Metformin 1000 mg BID, Januvia 100 mg daily, Glipizide XL 10 mg BID Current orders for Inpatient glycemic control: Novolog 0-15 units TID with meals, Novolog 0-5 units QHS, Tradjenta 5 mg daily  Inpatient Diabetes Program Recommendations  Insulin-Basal: Please consider ordering Lantus 8 units Q24H (based on 77 kg x 0.1 units).  HbgA1C:  A1C 10.5% on 06/24/19 indicating an average glucose of 255 mg/dl over the past 2-3 months.  Thanks, Barnie Alderman, RN, MSN, CDE Diabetes Coordinator Inpatient Diabetes Program [phone] (Team Pager from 8am to 5pm)

## 2019-06-25 NOTE — ED Notes (Signed)
Pt updated on admission process. Pt verbalizes understanding.  

## 2019-06-26 ENCOUNTER — Inpatient Hospital Stay: Payer: Medicare HMO | Admitting: Family

## 2019-06-26 ENCOUNTER — Encounter
Admission: EM | Disposition: A | Discharge status: Home / Self Care | Payer: Self-pay | Source: Home / Self Care | Attending: Internal Medicine

## 2019-06-26 ENCOUNTER — Encounter: Payer: Self-pay | Admitting: *Deleted

## 2019-06-26 DIAGNOSIS — K2289 Other specified disease of esophagus: Secondary | ICD-10-CM

## 2019-06-26 DIAGNOSIS — K2100 Gastro-esophageal reflux disease with esophagitis, without bleeding: Secondary | ICD-10-CM

## 2019-06-26 DIAGNOSIS — K228 Other specified diseases of esophagus: Secondary | ICD-10-CM

## 2019-06-26 HISTORY — PX: ESOPHAGOGASTRODUODENOSCOPY: SHX5428

## 2019-06-26 LAB — URINE CULTURE: Culture: NO GROWTH

## 2019-06-26 LAB — BASIC METABOLIC PANEL
Anion gap: 5 (ref 5–15)
BUN: 14 mg/dL (ref 8–23)
CO2: 25 mmol/L (ref 22–32)
Calcium: 8.1 mg/dL — ABNORMAL LOW (ref 8.9–10.3)
Chloride: 108 mmol/L (ref 98–111)
Creatinine, Ser: 1.14 mg/dL — ABNORMAL HIGH (ref 0.44–1.00)
GFR calc Af Amer: 53 mL/min — ABNORMAL LOW (ref >60)
GFR calc non Af Amer: 46 mL/min — ABNORMAL LOW (ref >60)
Glucose, Bld: 253 mg/dL — ABNORMAL HIGH (ref 70–99)
Potassium: 4 mmol/L (ref 3.5–5.1)
Sodium: 138 mmol/L (ref 135–145)

## 2019-06-26 LAB — CBC WITH DIFFERENTIAL/PLATELET
Abs Immature Granulocytes: 0.04 10*3/uL (ref 0.00–0.07)
Basophils Absolute: 0.1 10*3/uL (ref 0.0–0.1)
Basophils Relative: 1 %
Eosinophils Absolute: 0.2 10*3/uL (ref 0.0–0.5)
Eosinophils Relative: 2 %
HCT: 30 % — ABNORMAL LOW (ref 36.0–46.0)
Hemoglobin: 9.7 g/dL — ABNORMAL LOW (ref 12.0–15.0)
Immature Granulocytes: 0 %
Lymphocytes Relative: 25 %
Lymphs Abs: 2.5 10*3/uL (ref 0.7–4.0)
MCH: 26.3 pg (ref 26.0–34.0)
MCHC: 32.3 g/dL (ref 30.0–36.0)
MCV: 81.3 fL (ref 80.0–100.0)
Monocytes Absolute: 1 10*3/uL (ref 0.1–1.0)
Monocytes Relative: 10 %
Neutro Abs: 6 10*3/uL (ref 1.7–7.7)
Neutrophils Relative %: 62 %
Platelets: 349 10*3/uL (ref 150–400)
RBC: 3.69 MIL/uL — ABNORMAL LOW (ref 3.87–5.11)
RDW: 14.9 % (ref 11.5–15.5)
WBC: 9.8 10*3/uL (ref 4.0–10.5)
nRBC: 0 % (ref 0.0–0.2)

## 2019-06-26 LAB — GLUCOSE, CAPILLARY
Glucose-Capillary: 259 mg/dL — ABNORMAL HIGH (ref 70–99)
Glucose-Capillary: 158 mg/dL — ABNORMAL HIGH (ref 70–99)

## 2019-06-26 SURGERY — EGD (ESOPHAGOGASTRODUODENOSCOPY)
Anesthesia: General

## 2019-06-26 MED ORDER — PROPOFOL 10 MG/ML IV BOLUS
INTRAVENOUS | Status: AC
  Filled 2019-06-26: qty 40

## 2019-06-26 MED ORDER — METOPROLOL TARTRATE 25 MG PO TABS: 25.0000 mg | ORAL_TABLET | Freq: Two times a day (BID) | ORAL | 0 refills | Status: DC

## 2019-06-26 MED ORDER — PROPOFOL 10 MG/ML IV BOLUS
INTRAVENOUS | Status: DC | PRN
  Administered 2019-06-26: 60 mg via INTRAVENOUS
  Administered 2019-06-26: 10 mg via INTRAVENOUS

## 2019-06-26 MED ORDER — PANTOPRAZOLE SODIUM 40 MG PO TBEC: 40.0000 mg | DELAYED_RELEASE_TABLET | Freq: Two times a day (BID) | ORAL | Status: DC

## 2019-06-26 MED ORDER — INSULIN GLARGINE 100 UNIT/ML ~~LOC~~ SOLN: 12.0000 [IU] | Freq: Every day | SUBCUTANEOUS | Status: DC

## 2019-06-26 MED ORDER — PROPOFOL 500 MG/50ML IV EMUL
INTRAVENOUS | Status: DC | PRN
  Administered 2019-06-26: 160 ug/kg/min via INTRAVENOUS

## 2019-06-26 MED ORDER — ASPIRIN 81 MG PO TBEC: 81.0000 mg | DELAYED_RELEASE_TABLET | Freq: Every day | ORAL | 0 refills | Status: AC

## 2019-06-26 MED ORDER — SODIUM CHLORIDE 0.9 % IV SOLN
INTRAVENOUS | Status: DC
  Administered 2019-06-26: 10:00:00 via INTRAVENOUS

## 2019-06-26 MED ORDER — FUROSEMIDE 10 MG/ML IJ SOLN
20.0000 mg | Freq: Once | INTRAMUSCULAR | Status: AC
  Administered 2019-06-26: 08:00:00 20 mg via INTRAVENOUS
  Filled 2019-06-26: qty 4

## 2019-06-26 MED ORDER — ACETAMINOPHEN 325 MG PO TABS: 650.0000 mg | ORAL_TABLET | Freq: Four times a day (QID) | ORAL | Status: DC | PRN

## 2019-06-26 MED ORDER — SODIUM CHLORIDE 0.9 % IV SOLN: INTRAVENOUS | Status: DC

## 2019-06-26 MED ORDER — FUROSEMIDE 40 MG PO TABS: 40.0000 mg | ORAL_TABLET | Freq: Every day | ORAL | 1 refills | Status: DC

## 2019-06-26 MED ORDER — LIDOCAINE HCL (PF) 2 % IJ SOLN
INTRAMUSCULAR | Status: AC
  Filled 2019-06-26: qty 10

## 2019-06-26 MED ORDER — PROPOFOL 500 MG/50ML IV EMUL
INTRAVENOUS | Status: AC
  Filled 2019-06-26: qty 50

## 2019-06-26 MED ORDER — PANTOPRAZOLE SODIUM 40 MG PO TBEC: 40.0000 mg | DELAYED_RELEASE_TABLET | Freq: Two times a day (BID) | ORAL | 1 refills | Status: DC

## 2019-06-26 NOTE — Anesthesia Postprocedure Evaluation (Signed)
Anesthesia Post Note  Patient: Brittney [name]  Procedure(s) Performed: ESOPHAGOGASTRODUODENOSCOPY (EGD) (N/A )  Patient location during evaluation: Endoscopy Anesthesia Type: General Level of consciousness: awake and alert Pain management: pain level controlled Vital Signs Assessment: post-procedure vital signs reviewed and stable Respiratory status: spontaneous breathing, nonlabored ventilation, respiratory function stable and patient connected to nasal cannula oxygen Cardiovascular status: blood pressure returned to baseline and stable Postop Assessment: no apparent nausea or vomiting Anesthetic complications: no     Last Vitals:  Vitals:   06/26/19 1144 06/26/19 1233  BP: (!) 95/58 (!) 142/76  Pulse: 77   Resp: 19   Temp: 36.8 C   SpO2: 99%     Last Pain:  Vitals:   06/26/19 1144  TempSrc: Oral  PainSc:                  Sabastien Tyler S

## 2019-06-26 NOTE — Transfer of Care (Signed)
Immediate Anesthesia Transfer of Care Note  Patient: Brittney [name]  Procedure(s) Performed: ESOPHAGOGASTRODUODENOSCOPY (EGD) (N/A )  Patient Location: PACU  Anesthesia Type:General  Level of Consciousness: drowsy  Airway & Oxygen Therapy: Patient Spontanous Breathing and Patient connected to nasal cannula oxygen  Post-op Assessment: Report given to RN and Post -op Vital signs reviewed and stable  Post vital signs: Reviewed and stable  Last Vitals:  Vitals Value Taken Time  BP 118/58 06/26/19 1021  Temp    Pulse 70 06/26/19 1021  Resp 23 06/26/19 1021  SpO2 97 % 06/26/19 1021  Vitals shown include unvalidated device data.  Last Pain:  Vitals:   06/26/19 0953  TempSrc:   PainSc: 0-No pain      Patients Stated Pain Goal: 0 (04/10/92 0123)  Complications: No apparent anesthesia complications

## 2019-06-26 NOTE — Anesthesia Post-op Follow-up Note (Signed)
Anesthesia QCDR form completed.        

## 2019-06-26 NOTE — Progress Notes (Addendum)
Inpatient Diabetes Program Recommendations  AACE/ADA: New Consensus Statement on Inpatient Glycemic Control   Target Ranges:  Prepandial:   less than 140 mg/dL      Peak postprandial:   less than 180 mg/dL (1-2 hours)      Critically ill patients:  140 - 180 mg/dL   Results for Brittney [name], Brittney [name] (MRN [id number]) as of 06/26/2019 10:02  Ref. Range 06/25/2019 07:51 06/25/2019 11:38 06/25/2019 17:10 06/25/2019 22:18 06/26/2019 08:01  Glucose-Capillary Latest Ref Range: 70 - 99 mg/dL 331 (H) 218 (H) 142 (H) 268 (H) 259 (H)    Results for Brittney [name], Brittney [name] (MRN [id number]) as of 06/25/2019 11:32  Ref. Range 06/24/2019 17:50  Glucose Latest Ref Range: 70 - 99 mg/dL 428 (H)   Results for Brittney [name], Brittney [name] (MRN [id number]) as of 06/25/2019 11:32  Ref. Range 06/24/2019 20:17  Hemoglobin A1C Latest Ref Range: 4.8 - 5.6 % 10.5 (H)   Review of Glycemic Control  Diabetes history: DM2 Outpatient Diabetes medications: Victoza 1.8 mg daily, Metformin 1000 mg BID, Januvia 100 mg daily, Glipizide XL 10 mg BID Current orders for Inpatient glycemic control: Lantus 8 units daily, Novolog 0-15 units TID with meals, Novolog 0-5 units QHS, Tradjenta 5 mg daily  Inpatient Diabetes Program Recommendations  Insulin-Basal: Please consider increasing Lantus 15 units daily (based on 77 kg x 0.2 units).  HbgA1C:  A1C 10.5% on 06/24/19 indicating an average glucose of 255 mg/dl over the past 2-3 months.  Addendum 06/26/19@13 :28-Spoke with patient over the phone about diabetes and home regimen for diabetes control. Patient reports being followed by [facility] for diabetes management and currently taking Victoza 1.8 mg daily, Metformin 1000 mg BID, Januvia 100 mg daily, Glipizide XL 10 mg BID as an outpatient for diabetes control.  Patient reports that she ran out of Victoza last week and she called her pharmacy to refill it but was told they could not refill it yet. Patient reports taking DM medications as prescribed otherwise.  Patient reports checking glucose 1 time per day and that it is usually in the 200-300's mg/dl.   Discussed A1C results (10.5% on 06/24/19) and explained that current A1C indicates an average glucose of 255 mg/dl over the past 2-3 months. Discussed glucose and A1C goals. Discussed importance of checking CBGs and maintaining good CBG control to prevent long-term and short-term complications. Explained how hyperglycemia leads to damage within blood vessels which lead to the common complications seen with uncontrolled diabetes. Stressed to the patient the importance of improving glycemic control to prevent further complications from uncontrolled diabetes. Encouraged patient to ask PCP about referring to an Endocrinologist for assistance with improving DM control.  Encouraged patient to check glucose 2-3 times per day and to keep a log book of glucose readings and DM medication taken which patient will need to take to doctor appointments. Explained how the doctor can use the log book to continue to make adjustments with DM medications if needed.  Patient verbalized understanding of information discussed and reports no further questions at this time related to diabetes.  Thanks, Barnie Alderman, RN, MSN, CDE Diabetes Coordinator Inpatient Diabetes Program [phone] (Team Pager from 8am to 5pm)

## 2019-06-26 NOTE — Discharge Summary (Addendum)
[facility] NAME: Brittney [name]    MR#:  [id number]  DATE OF BIRTH:  1941/05/31  DATE OF ADMISSION:  06/24/2019 ADMITTING PHYSICIAN: Harrie Foreman, MD  DATE OF DISCHARGE:  06/26/19   PRIMARY CARE PHYSICIAN: Elisabeth Cara, NP    ADMISSION DIAGNOSIS:  Dehydration [E86.0] PUD (peptic ulcer disease) [K27.9] Hyperglycemia [R73.9]  DISCHARGE DIAGNOSIS:  Sepsis ruled out Covid  test negative   SECONDARY DIAGNOSIS:   Past Medical History:  Diagnosis Date  . Aortic valve stenosis   . Carotid bruit   . CHF (congestive heart failure) ([place])   . Chronic kidney disease   . Diabetes mellitus without complication ([place])   . GERD (gastroesophageal reflux disease)   . Heart murmur   . Hyperlipidemia   . Hypertension   . Pneumonia   . Vitamin D deficiency     HOSPITAL COURSE:  HPI: The patient with past medical history of hypertension, CKD and diabetes presents to the emergency department complaining of nausea, vomiting abdominal pain and diarrhea.  Emesis and diarrhea have both been nonbloody.  She denies fevers, chest pain or shortness of breath.  Laboratory evaluation showed elevated lactic acid and leukocytosis.  CT of her abdomen showed edema of the proximal duodenum without perforation or abscess in addition to cholelithiasis and aortic atherosclerosis.  The patient met criteria for sepsis which prompted fluid resuscitation as well collection of blood cultures prior to administration of Zosyn.  The patient received antiemetics and pain medication in the emergency department prior to the hospitalist service being called for admission.  1. peptic ulcer disease.  EGD has revealed grade a reflux esophagitis.  Nonbleeding duodenal ulcer with a clean ulcer base no specimens collected Continue PPI twice a day.  Avoid NSAIDs H. pylori serology done results need to be followed up by PCP/gastroenterology.  Outpatient GI follow-up in  4 weeks .  Okay to discharge patient from GI standpoint 2.  Sepsis: Patient met criteria at the time of admission via tachycardia, tachypnea and leukocytosis.  Lactic acid is also elevated.  She is hemodynamically stable.  I suspect all of the above evidence is inflammatory and secondary to recurrent vomiting and dehydration.  With hydration lactic acid improved.  Blood cultures and urine cultures are negative sepsis ruled out and antibiotics discontinued  3.  Diabetes mellitus type 2: Sliding scale insulin while hospitalized.  Resume home medications at the time of discharge 4.  Hypertension: Better; initially elevated partly due to recurrent vomiting.  Continue amlodipine, hydralazine and losartan.  Restart Imdur and metoprolol  5.  Hyperlipidemia: Continue statin therapy 6.  DVT prophylaxis: Lovenox 7.  GI prophylaxis: Pantoprazole per home regimen The patient is a full code DISCHARGE CONDITIONS:   stable  CONSULTS OBTAINED:  Treatment Team:  Virgel Manifold, MD   PROCEDURES EGD  - LA Grade A reflux esophagitis. - Salmon-colored mucosa suspicious for short-segment Barrett's esophagus. - Normal stomach. - Non-bleeding duodenal ulcer with a clean ulcer base (Forrest Class III). - Mucosal changes in the duodenum. - Normal second portion of the duodenum. - No specimens collected. Impression: - Advance diet as tolerated. - Use Protonix (pantoprazole) 40 mg PO BID. - Avoid NSAIDs except Aspirin if medically indicated by PCP - Perform an H. pylori serology today. - The findings and recommendations were discussed with the patient by gastroenterology DRUG ALLERGIES:   Allergies  Allergen Reactions  . Accupril [Quinapril Hcl]     DISCHARGE  MEDICATIONS:   Allergies as of 06/26/2019      Reactions   Accupril [quinapril Hcl]       Medication List    STOP taking these medications   aspirin 81 MG tablet Replaced by: aspirin 81 MG EC tablet   omeprazole 20 MG  capsule Commonly known as: PRILOSEC   ranitidine 150 MG tablet Commonly known as: ZANTAC     TAKE these medications   acetaminophen 325 MG tablet Commonly known as: TYLENOL Take 2 tablets (650 mg total) by mouth every 6 (six) hours as needed for mild pain (or Fever >/= 101).   ALBUTEROL IN Inhale 90 mcg into the lungs every 6 (six) hours as needed (wheezing).   amLODipine 10 MG tablet Commonly known as: NORVASC Take 10 mg by mouth daily.   aspirin 81 MG EC tablet Take 1 tablet (81 mg total) by mouth daily. Start taking on: June 27, 2019 Replaces: aspirin 81 MG tablet   atorvastatin 40 MG tablet Commonly known as: LIPITOR Take 1 tablet by mouth daily.   cetirizine 10 MG tablet Commonly known as: ZYRTEC Take 10 mg by mouth daily.   Choline Fenofibrate 45 MG capsule Take 90 mg by mouth daily.   furosemide 40 MG tablet Commonly known as: LASIX Take 1 tablet (40 mg total) by mouth daily.   glipiZIDE 10 MG 24 hr tablet Commonly known as: GLUCOTROL XL Take 10 mg by mouth 2 (two) times daily.   hydrALAZINE 25 MG tablet Commonly known as: APRESOLINE Take 25 mg by mouth 3 (three) times daily. Taking twice a day   isosorbide mononitrate 30 MG 24 hr tablet Commonly known as: IMDUR Take 30 mg by mouth daily.   losartan 100 MG tablet Commonly known as: COZAAR Take 100 mg by mouth daily.   metFORMIN 1000 MG tablet Commonly known as: GLUCOPHAGE Take 1,000 mg by mouth 2 (two) times daily with a meal. Just started back. Not sure how many mg   metoprolol tartrate 25 MG tablet Commonly known as: LOPRESSOR Take 1 tablet (25 mg total) by mouth 2 (two) times daily. What changed:   medication strength  how much to take   pantoprazole 40 MG tablet Commonly known as: PROTONIX Take 1 tablet (40 mg total) by mouth 2 (two) times daily.   sitaGLIPtin 100 MG tablet Commonly known as: JANUVIA Take 100 mg by mouth daily.   Victoza 18 MG/3ML Sopn Generic drug:  liraglutide Inject 1.8 mg into the skin every morning.        DISCHARGE INSTRUCTIONS:   Follow-up with primary care physician in 3 days With gastroenterology Dr. Bonna Gains in 4 weeks  DIET:  Cardiac diet and Diabetic diet  DISCHARGE CONDITION:  Fair  ACTIVITY:  Activity as tolerated  OXYGEN:  Home Oxygen: No.   Oxygen Delivery: room air  DISCHARGE LOCATION:  home   If you experience worsening of your admission symptoms, develop shortness of breath, life threatening emergency, suicidal or homicidal thoughts you must seek medical attention immediately by calling 911 or calling your MD immediately  if symptoms less severe.  You Must read complete instructions/literature along with all the possible adverse reactions/side effects for all the Medicines you take and that have been prescribed to you. Take any new Medicines after you have completely understood and accpet all the possible adverse reactions/side effects.   Please note  You were cared for by a hospitalist during your hospital stay. If you have any questions about your discharge medications  or the care you received while you were in the hospital after you are discharged, you can call the unit and asked to speak with the hospitalist on call if the hospitalist that took care of you is not available. Once you are discharged, your primary care physician will handle any further medical issues. Please note that NO REFILLS for any discharge medications will be authorized once you are discharged, as it is imperative that you return to your primary care physician (or establish a relationship with a primary care physician if you do not have one) for your aftercare needs so that they can reassess your need for medications and monitor your lab values.     Today  Chief Complaint  Patient presents with  . Emesis  . Diarrhea  Patient had EGD.  Okay to discharge patient from GI standpoint of she gets lunch.  ROS:  CONSTITUTIONAL:  Denies fevers, chills. Denies any fatigue, weakness.  EYES: Denies blurry vision, double vision, eye pain. EARS, NOSE, THROAT: Denies tinnitus, ear pain, hearing loss. RESPIRATORY: Denies cough, wheeze, shortness of breath.  CARDIOVASCULAR: Denies chest pain, palpitations, edema.  GASTROINTESTINAL: Denies nausea, vomiting, diarrhea, abdominal pain. Denies bright red blood per rectum. GENITOURINARY: Denies dysuria, hematuria. ENDOCRINE: Denies nocturia or thyroid problems. HEMATOLOGIC AND LYMPHATIC: Denies easy bruising or bleeding. SKIN: Denies rash or lesion. MUSCULOSKELETAL: Denies pain in neck, back, shoulder, knees, hips or arthritic symptoms.  NEUROLOGIC: Denies paralysis, paresthesias.  PSYCHIATRIC: Denies anxiety or depressive symptoms.   VITAL SIGNS:  Blood pressure (!) 142/76, pulse 77, temperature 98.3 F (36.8 C), temperature source Oral, resp. rate 19, height 5' 6" (1.676 m), weight 84.1 kg, SpO2 99 %.  I/O:    Intake/Output Summary (Last 24 hours) at 06/26/2019 1245 Last data filed at 06/26/2019 1147 Gross per 24 hour  Intake 2721.93 ml  Output 1425 ml  Net 1296.93 ml    PHYSICAL EXAMINATION:  GENERAL:  78 y.o.-year-old patient lying in the bed with no acute distress.  EYES: Pupils equal, round, reactive to light and accommodation. No scleral icterus. Extraocular muscles intact.  HEENT: Head atraumatic, normocephalic. Oropharynx and nasopharynx clear.  NECK:  Supple, no jugular venous distention. No thyroid enlargement, no tenderness.  LUNGS: Normal breath sounds bilaterally, no wheezing, rales,rhonchi or crepitation. No use of accessory muscles of respiration.  CARDIOVASCULAR: S1, S2 normal. No murmurs, rubs, or gallops.  ABDOMEN: Soft, non-tender, non-distended. Bowel sounds present.  EXTREMITIES: No pedal edema, cyanosis, or clubbing.  NEUROLOGIC: Cranial nerves II through XII are intact. Muscle strength 5/5 in all extremities. Sensation intact. Gait not checked.   PSYCHIATRIC: The patient is alert and oriented x 3.  SKIN: No obvious rash, lesion, or ulcer.   DATA REVIEW:   CBC Recent Labs  Lab 06/26/19 0519  WBC 9.8  HGB 9.7*  HCT 30.0*  PLT 349    Chemistries  Recent Labs  Lab 06/24/19 1750 06/26/19 0519  NA 135 138  K 4.2 4.0  CL 98 108  CO2 20* 25  GLUCOSE 428* 253*  BUN 23 14  CREATININE 1.14* 1.14*  CALCIUM 10.3 8.1*  AST 21  --   ALT 21  --   ALKPHOS 58  --   BILITOT 0.9  --     Cardiac Enzymes No results for input(s): TROPONINI in the last 168 hours.  Microbiology Results  Results for orders placed or performed during the hospital encounter of 06/24/19  SARS Coronavirus 2 (CEPHEID - Performed in [place] lab), [facility]  Order     Status: None   Collection Time: 06/25/19 12:01 AM   Specimen: Nasopharyngeal Swab  Result Value Ref Range Status   SARS Coronavirus 2 NEGATIVE NEGATIVE Final    Comment: (NOTE) If result is NEGATIVE SARS-CoV-2 target nucleic acids are NOT DETECTED. The SARS-CoV-2 RNA is generally detectable in upper and lower  respiratory specimens during the acute phase of infection. The lowest  concentration of SARS-CoV-2 viral copies this assay can detect is 250  copies / mL. A negative result does not preclude SARS-CoV-2 infection  and should not be used as the sole basis for treatment or other  patient management decisions.  A negative result may occur with  improper specimen collection / handling, submission of specimen other  than nasopharyngeal swab, presence of viral mutation(s) within the  areas targeted by this assay, and inadequate number of viral copies  (<250 copies / mL). A negative result must be combined with clinical  observations, patient history, and epidemiological information. If result is POSITIVE SARS-CoV-2 target nucleic acids are DETECTED. The SARS-CoV-2 RNA is generally detectable in upper and lower  respiratory specimens dur ing the acute phase of infection.   Positive  results are indicative of active infection with SARS-CoV-2.  Clinical  correlation with patient history and other diagnostic information is  necessary to determine patient infection status.  Positive results do  not rule out bacterial infection or co-infection with other viruses. If result is PRESUMPTIVE POSTIVE SARS-CoV-2 nucleic acids MAY BE PRESENT.   A presumptive positive result was obtained on the submitted specimen  and confirmed on repeat testing.  While 2019 novel coronavirus  (SARS-CoV-2) nucleic acids may be present in the submitted sample  additional confirmatory testing may be necessary for epidemiological  and / or clinical management purposes  to differentiate between  SARS-CoV-2 and other Sarbecovirus currently known to infect humans.  If clinically indicated additional testing with an alternate test  methodology [phone]) is advised. The SARS-CoV-2 RNA is generally  detectable in upper and lower respiratory sp ecimens during the acute  phase of infection. The expected result is Negative. Fact Sheet for Patients:  StrictlyIdeas.no Fact Sheet for Healthcare Providers: BankingDealers.co.za This test is not yet approved or cleared by the [place] FDA and has been authorized for detection and/or diagnosis of SARS-CoV-2 by FDA under an Emergency Use Authorization (EUA).  This EUA will remain in effect (meaning this test can be used) for the duration of the COVID-19 declaration under Section 564(b)(1) of the Act, 21 U.S.C. section 360bbb-3(b)(1), unless the authorization is terminated or revoked sooner. Performed at [facility], [address] [place], [place] [address]   Culture, blood (routine x 2)     Status: None (Preliminary result)   Collection Time: 06/25/19 12:55 AM   Specimen: BLOOD  Result Value Ref Range Status   Specimen Description BLOOD RIGHT [facility]  Final   Special Requests   Final     BOTTLES DRAWN AEROBIC AND ANAEROBIC Blood Culture adequate volume   Culture   Final    NO GROWTH 1 DAY Performed at [facility], [address], [place], [place] [address]    Report Status PENDING  Incomplete  Culture, blood (routine x 2)     Status: None (Preliminary result)   Collection Time: 06/25/19  1:06 AM   Specimen: BLOOD  Result Value Ref Range Status   Specimen Description BLOOD RIGHT [facility]  Final   Special Requests   Final  BOTTLES DRAWN AEROBIC AND ANAEROBIC Blood Culture adequate volume   Culture   Final    NO GROWTH 1 DAY Performed at [facility], [address] [place], [place] [address]    Report Status PENDING  Incomplete  Urine culture     Status: None   Collection Time: 06/25/19  1:45 AM   Specimen: Urine, Random  Result Value Ref Range Status   Specimen Description   Final    URINE, RANDOM Performed at [facility], [address], [place], [place] [address]    Special Requests   Final    NONE Performed at [facility], [address], [place], [place] [address]    Culture   Final    NO GROWTH Performed at [facility], [address] [place], [place] [address]    Report Status 06/26/2019 FINAL  Final    RADIOLOGY:  Dg Chest 2 View  Result Date: 06/24/2019 CLINICAL DATA:  Chest pain, epigastric pain, nausea, vomiting, and diarrhea beginning this morning, recently tested and is COVID-19 negative EXAM: CHEST - 2 VIEW COMPARISON:  01/02/2019 FINDINGS: Normal heart size, mediastinal contours, and pulmonary vascularity. Atherosclerotic calcification aorta. Lungs clear. No infiltrate, pleural effusion or pneumothorax. Bones demineralized. IMPRESSION: No acute abnormalities. Electronically Signed   By: Lavonia Dana M.D.   On: 06/24/2019 20:20   Ct Abdomen Pelvis W Contrast  Result Date: 06/24/2019 CLINICAL DATA:  78 y/o F; epigastric pain, nausea, vomiting, diarrhea. EXAM: CT ABDOMEN AND PELVIS WITH CONTRAST  TECHNIQUE: Multidetector CT imaging of the abdomen and pelvis was performed using the standard protocol following bolus administration of intravenous contrast. CONTRAST:  118m OMNIPAQUE IOHEXOL 300 MG/ML  SOLN COMPARISON:  08/05/2018 CT abdomen and pelvis. FINDINGS: Lower chest: No acute abnormality. Hepatobiliary: No focal liver abnormality is seen. Cholelithiasis. No pericholecystic fluid collection, gallbladder wall thickening, or biliary dilatation. Pancreas: Unremarkable. No pancreatic ductal dilatation or surrounding inflammatory changes. Spleen: Normal in size without focal abnormality. Adrenals/Urinary Tract: Adrenal glands are unremarkable. Small renal cysts bilaterally. Otherwise kidneys are normal, without renal calculi, focal lesion, or hydronephrosis. Bladder is unremarkable. Stomach/Bowel: Severe wall thickening and surrounding edema centered in the proximal duodenum. No extraluminal air to indicate perforation. Appendix not identified, no pericecal inflammation. Otherwise no evidence of bowel wall thickening, distention, or inflammatory changes. Vascular/Lymphatic: Aortic atherosclerosis. No enlarged abdominal or pelvic lymph nodes. Reproductive: Uterus and bilateral adnexa are unremarkable. Other: No abdominal wall hernia or abnormality. No abdominopelvic ascites. Musculoskeletal: No fracture is seen. Lumbar spondylosis with advanced facet arthropathy and L4-5 grade 1 anterolisthesis. IMPRESSION: 1. Severe wall thickening and surrounding edema centered in the proximal duodenum probably representing underlying peptic ulcer disease or possibly infectious/inflammatory duodenitis. No findings of perforation or abscess. 2. Cholelithiasis. 3. Aortic Atherosclerosis (ICD10-I70.0). Electronically Signed   By: LKristine GarbeM.D.   On: 06/24/2019 23:41    EKG:   Orders placed or performed during the hospital encounter of 06/24/19  . ED EKG  . ED EKG      Management plans discussed with  the patient, daughter TOtila Kluverwas updated and they are in agreement.  CODE STATUS:     Code Status Orders  (From admission, onward)         Start     Ordered   06/25/19 0548  Full code  Continuous     06/25/19 0547        Code Status History    Date Active Date Inactive Code Status Order  ID Comments User Context   01/23/2019 1411 01/23/2019 1854 Full Code [id number]  Yolonda Kida, MD Inpatient   01/02/2019 0708 01/06/2019 1740 Full Code [id number]  Arta Silence, MD ED   08/23/2018 0349 08/25/2018 1431 Full Code [id number]  Harrie Foreman, MD ED   08/05/2018 1759 08/11/2018 2038 Full Code [id number]  [name], MD Inpatient   Advance Care Planning Activity      TOTAL TIME TAKING CARE OF THIS PATIENT:  45  minutes.   Note: This dictation was prepared with Dragon dictation along with smaller phrase technology. Any transcriptional errors that result from this process are unintentional.   _0 @  on 06/26/2019 at 12:45 PM  Between 7am to 6pm - Pager - [phone]  After 6pm go to [email or web address] - password [email or web address]  [facility]  Office  [phone]  CC: Primary care physician; Elisabeth Cara, NP

## 2019-06-26 NOTE — Discharge Instructions (Signed)
Follow-up with primary care physician in 3 days With gastroenterology Dr. Bonna Gains in 4 weeks

## 2019-06-26 NOTE — Anesthesia Preprocedure Evaluation (Signed)
Anesthesia Evaluation  Patient identified by MRN, date of birth, ID band Patient awake    Reviewed: Allergy & Precautions, H&P , NPO status , Patient's Chart, lab work & pertinent test results, reviewed documented beta blocker date and time   Airway Mallampati: II   Neck ROM: full    Dental  (+) Poor Dentition   Pulmonary neg pulmonary ROS, pneumonia, former smoker,    Pulmonary exam normal        Cardiovascular Exercise Tolerance: Poor hypertension, On Medications +CHF  negative cardio ROS Normal cardiovascular exam+ Valvular Problems/Murmurs  Rhythm:regular Rate:Normal     Neuro/Psych negative neurological ROS  negative psych ROS   GI/Hepatic negative GI ROS, Neg liver ROS, GERD  ,  Endo/Other  negative endocrine ROSdiabetes  Renal/GU Renal diseasenegative Renal ROS  negative genitourinary   Musculoskeletal   Abdominal   Peds  Hematology negative hematology ROS (+)   Anesthesia Other Findings Past Medical History: No date: Aortic valve stenosis No date: Carotid bruit No date: CHF (congestive heart failure) (H[facility]) No date: Chronic kidney disease No date: Diabetes mellitus without complication ([facility]) No date: GERD (gastroesophageal reflux disease) No date: Heart murmur No date: Hyperlipidemia No date: Hypertension No date: Pneumonia No date: Vitamin D deficiency Past Surgical History: No date: COLONOSCOPY 01/23/2019: LEFT HEART CATH AND CORONARY ANGIOGRAPHY; N/A     Comment:  Procedure: LEFT HEART CATH AND CORONARY ANGIOGRAPHY;                Surgeon: Yolonda Kida, MD;  Location: [place]              CV LAB;  Service: Cardiovascular;  Laterality: N/A; No date: ovarian cyst removal BMI    Body Mass Index: 29.93 kg/m     Reproductive/Obstetrics negative OB ROS                             Anesthesia Physical Anesthesia Plan  ASA: III and emergent  Anesthesia Plan:  General   Post-op Pain Management:    Induction:   PONV Risk Score and Plan:   Airway Management Planned:   Additional Equipment:   Intra-op Plan:   Post-operative Plan:   Informed Consent: I have reviewed the patients History and Physical, chart, labs and discussed the procedure including the risks, benefits and alternatives for the proposed anesthesia with the patient or authorized representative who has indicated his/her understanding and acceptance.     Dental Advisory Given  Plan Discussed with: CRNA  Anesthesia Plan Comments:         Anesthesia Quick Evaluation

## 2019-06-26 NOTE — Op Note (Signed)
[facility] Gastroenterology Patient Name: Brittney [name] Procedure Date: 06/26/2019 9:54 AM MRN: [id number] Account #: [id number] Date of Birth: 03-06-1941 Admit Type: Inpatient Age: 78 Room: [facility] ENDO ROOM 1 Gender: Female Note Status: Finalized Procedure:            Upper GI endoscopy Indications:          Suspected duodenitis Providers:            Varnita B. Bonna Gains MD, MD Referring MD:         Elisabeth Cara NP Medicines:            Monitored Anesthesia Care Complications:        No immediate complications. Procedure:            Pre-Anesthesia Assessment:                       - The risks and benefits of the procedure and the                        sedation options and risks were discussed with the                        patient. All questions were answered and informed                        consent was obtained.                       - Patient identification and proposed procedure were                        verified prior to the procedure.                       - ASA Grade Assessment: III - A patient with severe                        systemic disease.                       After obtaining informed consent, the endoscope was                        passed under direct vision. Throughout the procedure,                        the patient's blood pressure, pulse, and oxygen                        saturations were monitored continuously. The Endoscope                        was introduced through the mouth, and advanced to the                        second part of duodenum. The upper GI endoscopy was                        accomplished with ease. The patient tolerated the  procedure well. Findings:      LA Grade A (one or more mucosal breaks less than 5 mm, not extending       between tops of 2 mucosal folds) esophagitis with no bleeding was found       at the gastroesophageal junction.      One tongue of salmon-colored mucosa was  present. Biopsies were not done       due to presence of adjacent duodenitis.      The entire examined stomach was normal.      One non-bleeding superficial duodenal ulcer with a clean ulcer base       (Forrest Class III) was found in the duodenal bulb. The lesion was 7 mm       in largest dimension.      Patchy moderate mucosal changes characterized by erythema were found in       the duodenal bulb.      The second portion of the duodenum was normal. Impression:           - LA Grade A reflux esophagitis.                       - Salmon-colored mucosa suspicious for short-segment                        Barrett's esophagus.                       - Normal stomach.                       - Non-bleeding duodenal ulcer with a clean ulcer base                        (Forrest Class III).                       - Mucosal changes in the duodenum.                       - Normal second portion of the duodenum.                       - No specimens collected. Recommendation:       - Advance diet as tolerated.                       - Use Protonix (pantoprazole) 40 mg PO BID.                       - Avoid NSAIDs except Aspirin if medically indicated by                        PCP                       - Perform an H. pylori serology today.                       - The findings and recommendations were discussed with                        the patient. Procedure Code(s):    --- Professional ---  64680, Esophagogastroduodenoscopy, flexible, transoral;                        diagnostic, including collection of specimen(s) by                        brushing or washing, when performed (separate procedure) Diagnosis Code(s):    --- Professional ---                       K21.0, Gastro-esophageal reflux disease with esophagitis                       K22.8, Other specified diseases of esophagus                       K26.9, Duodenal ulcer, unspecified as acute or chronic,                         without hemorrhage or perforation                       K31.89, Other diseases of stomach and duodenum CPT copyright 2019 [employer]. All rights reserved. The codes documented in this report are preliminary and upon coder review may  be revised to meet current compliance requirements.  Vonda Antigua, MD Margretta Sidle B. Bonna Gains MD, MD 06/26/2019 10:30:47 AM This report has been signed electronically. Number of Addenda: 0 Note Initiated On: 06/26/2019 9:54 AM      [facility]

## 2019-06-27 LAB — H PYLORI, IGM, IGG, IGA AB
H Pylori IgG: 0.9 Index Value — ABNORMAL HIGH (ref 0.00–0.79)
H. Pylogi, Iga Abs: <9 units (ref 0.0–8.9)
H. Pylogi, Igm Abs: <9 units (ref 0.0–8.9)

## 2019-06-30 LAB — CULTURE, BLOOD (ROUTINE X 2)
Culture: NO GROWTH
Culture: NO GROWTH
Special Requests: ADEQUATE
Special Requests: ADEQUATE

## 2019-07-07 IMAGING — DX DG CHEST 1V PORT
1 series · 1 of 1 positions shown · non-contrast
Comparison: 08/05/2018

CLINICAL DATA: Pneumonia, abnormal blood gas

EXAM:
PORTABLE CHEST 1 VIEW

[chest ap]
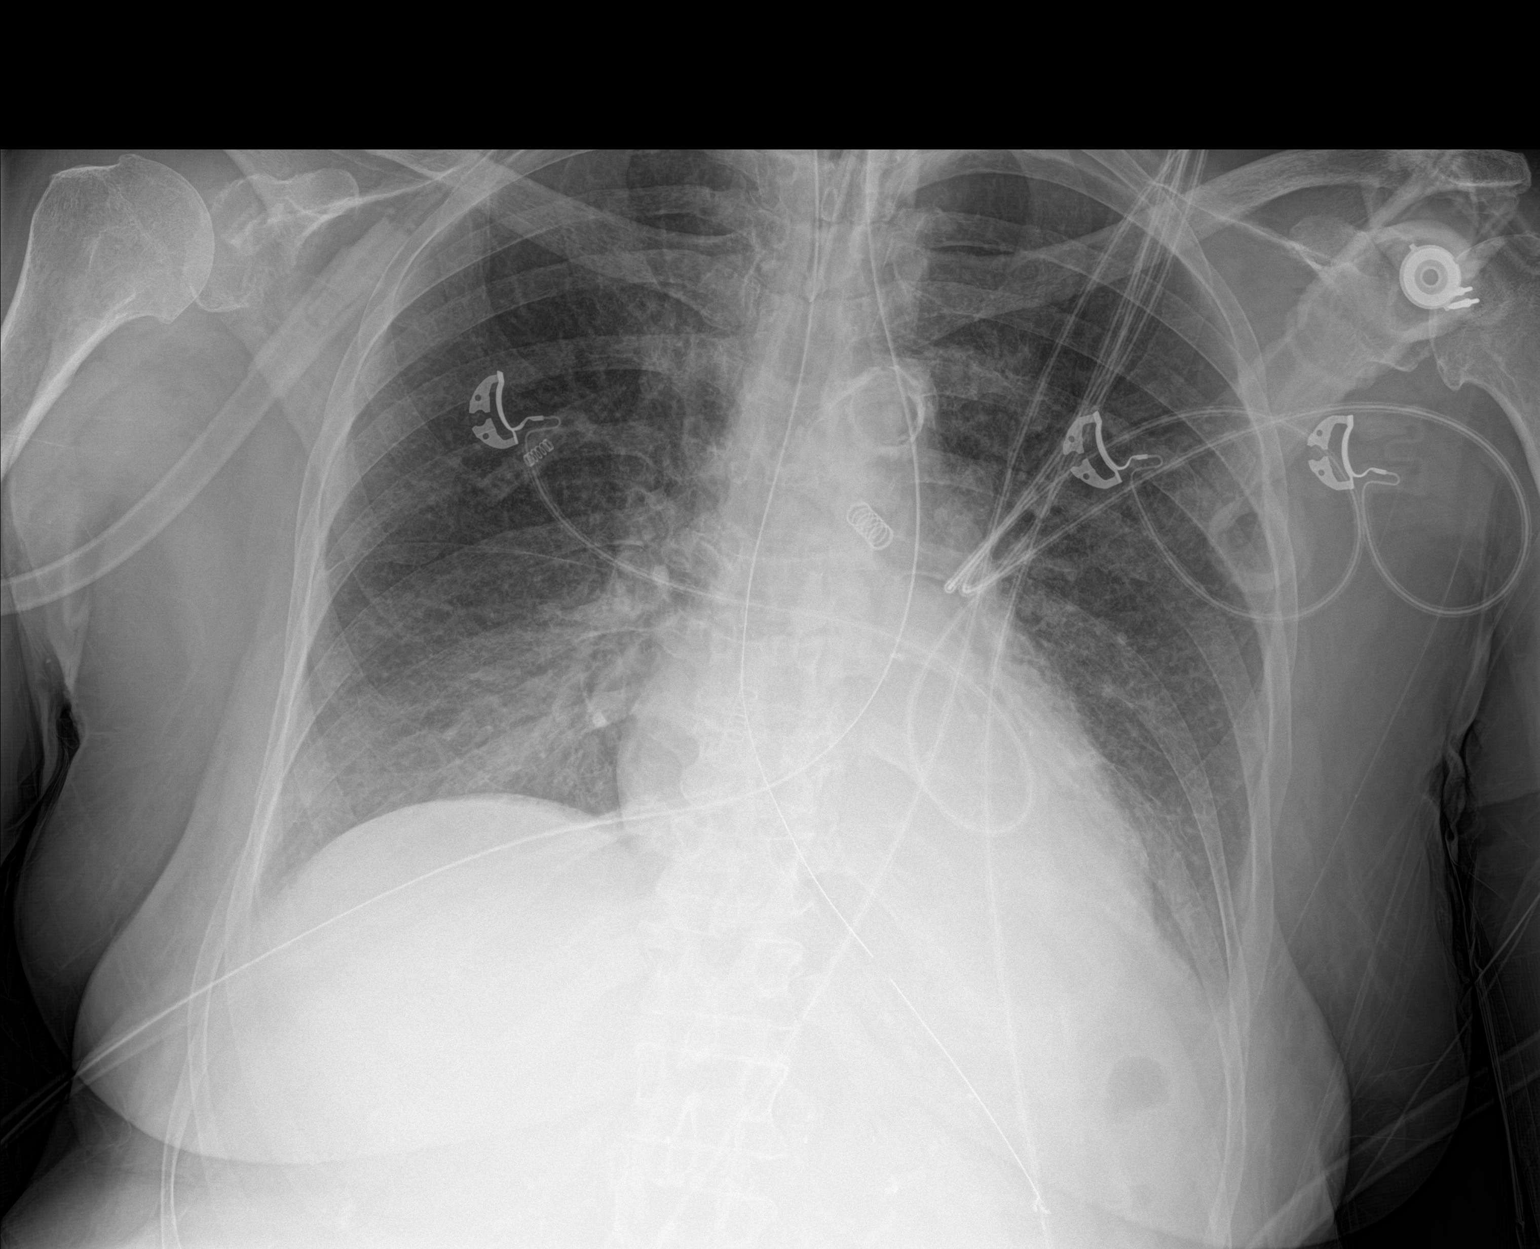

[1 of 1 positions shown; findings below may reference images not displayed]

FINDINGS: Endotracheal tube terminates 6.5 cm above the carina.

Enteric tube courses into the proximal gastric body.

Mild left basilar atelectasis. Possible small left pleural effusion.
Right lung is essentially clear. No pneumothorax.

The heart is normal in size.

Thoracic aortic atherosclerosis.
IMPRESSION: Endotracheal tube terminates 6.5 cm above the carina.

Mild left basilar atelectasis with possible small left pleural
effusion.

## 2019-07-08 IMAGING — DX DG ABDOMEN 1V
1 series · 1 of 1 positions shown · non-contrast
Comparison: Radiograph dated 08/05/2018

CLINICAL DATA: NG tube placement.

EXAM:
ABDOMEN - 1 VIEW

[abdomen supine]
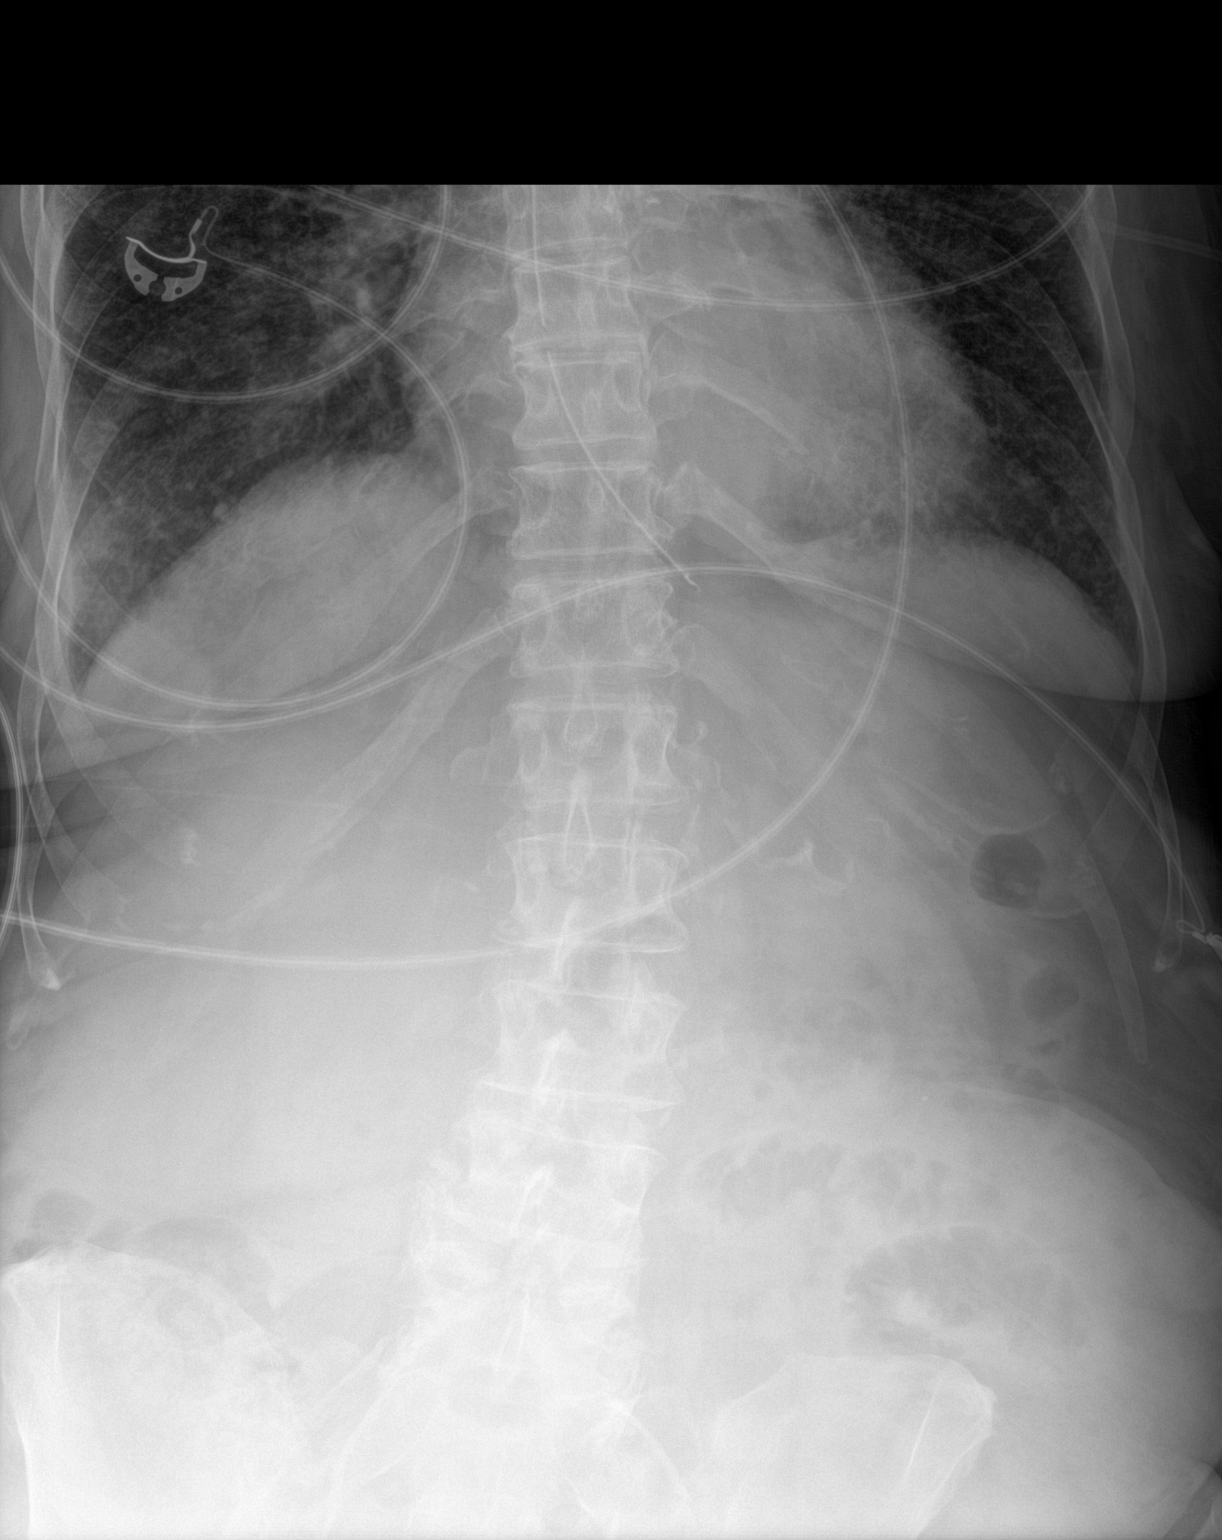

[1 of 1 positions shown; findings below may reference images not displayed]

FINDINGS: The tip of the NG tube is in the distal esophagus just proximal to
the gastroesophageal junction.

The visualized bowel gas pattern is normal.

Diffuse accentuation of the interstitial markings of both lungs with
Kerley B-lines suggesting interstitial edema are noted.
IMPRESSION: NG tube tip at the gastroesophageal junction.

Interstitial pulmonary edema, unchanged.

## 2019-07-09 IMAGING — DX DG CHEST 1V PORT
1 series · 1 of 1 positions shown · non-contrast
Comparison: 08/07/2018.

CLINICAL DATA: Intubation.

EXAM:
PORTABLE CHEST 1 VIEW

[chest ap]
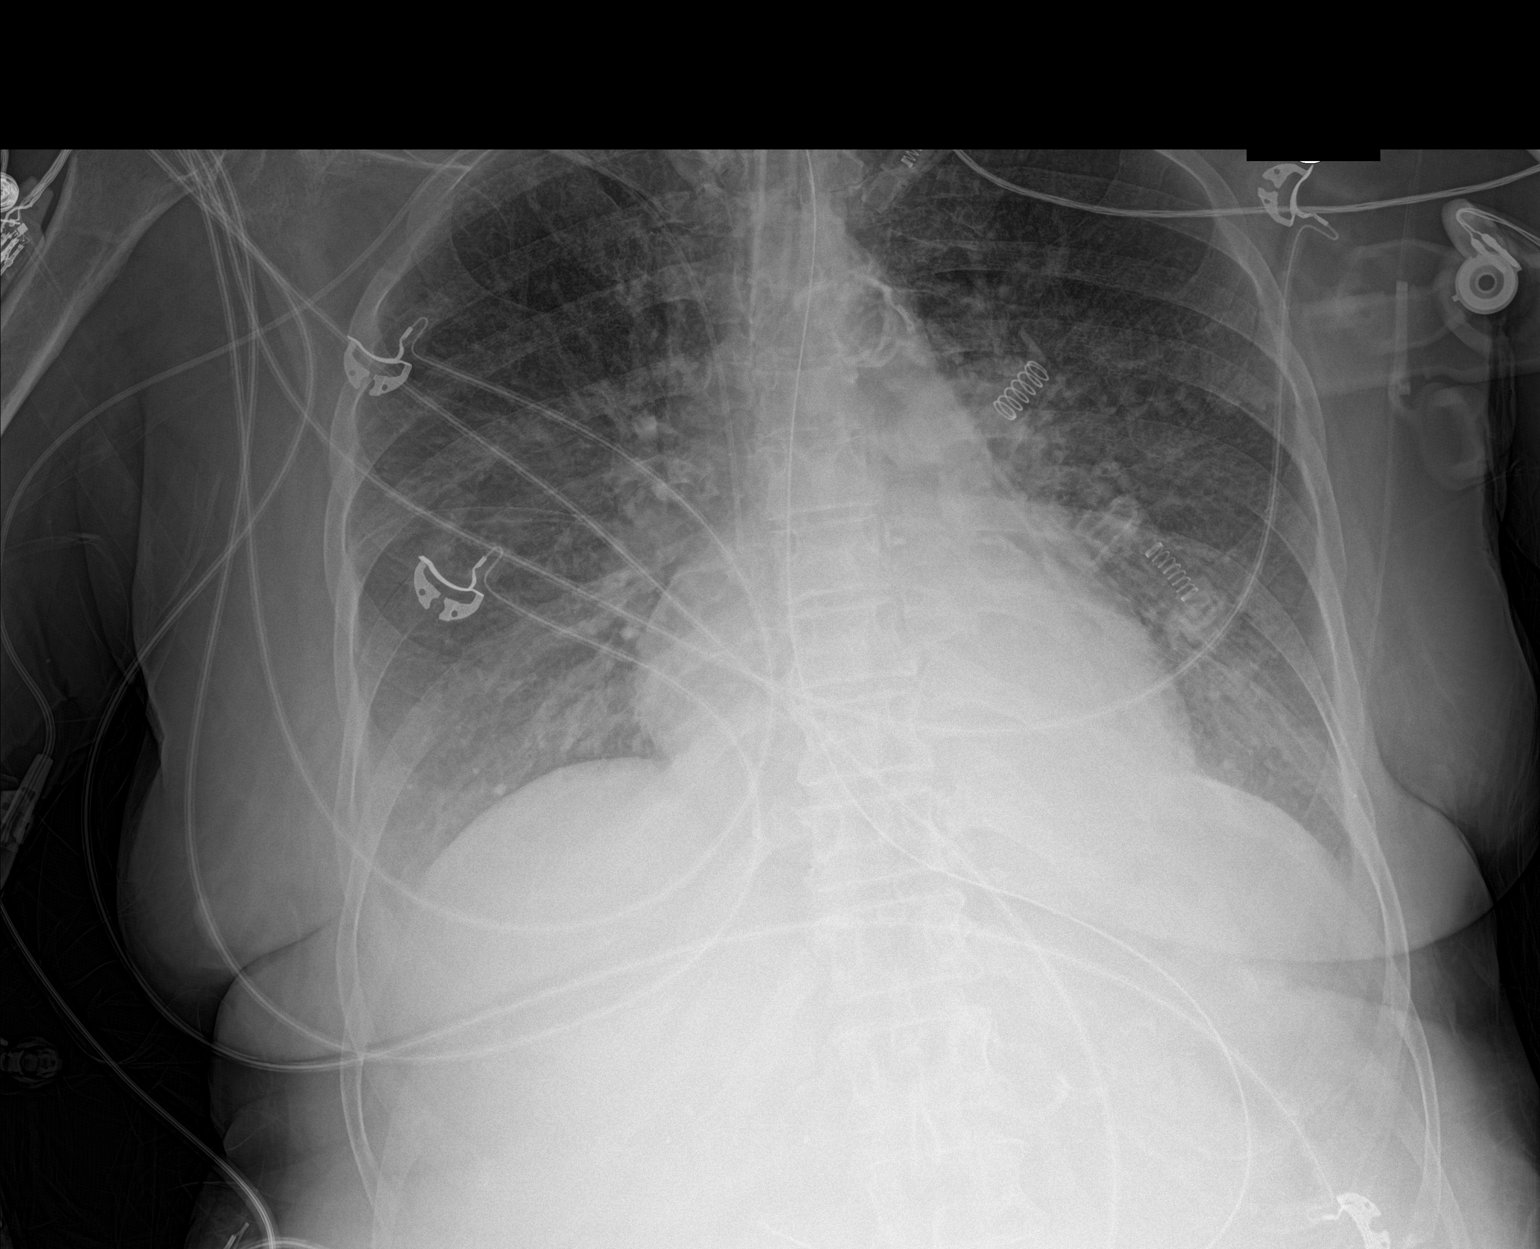

[1 of 1 positions shown; findings below may reference images not displayed]

FINDINGS: NG tube has been uncoiled and repositioned. NG tube tip below left
hemidiaphragm. NG tube, right PICC line in stable position. Mild
cardiomegaly with bilateral interstitial prominence. Small
right-sided pleural effusion. Findings suggest mild CHF. Pneumonitis
cannot be excluded. No pneumothorax.
IMPRESSION: 1. NG tube has been uncoiled and repositioned. Its tip is below left
hemidiaphragm. Endotracheal tube and right PICC line stable
position.

2 cardiomegaly with bilateral interstitial prominence and small
right pleural effusions suggesting mild CHF. Pneumonitis cannot be
excluded.

## 2019-07-10 IMAGING — DX DG CHEST 1V PORT
1 series · 1 of 1 positions shown · non-contrast
Comparison: Radiograph August 08, 2018.

CLINICAL DATA: Respiratory failure.

EXAM:
PORTABLE CHEST 1 VIEW

[chest ap]
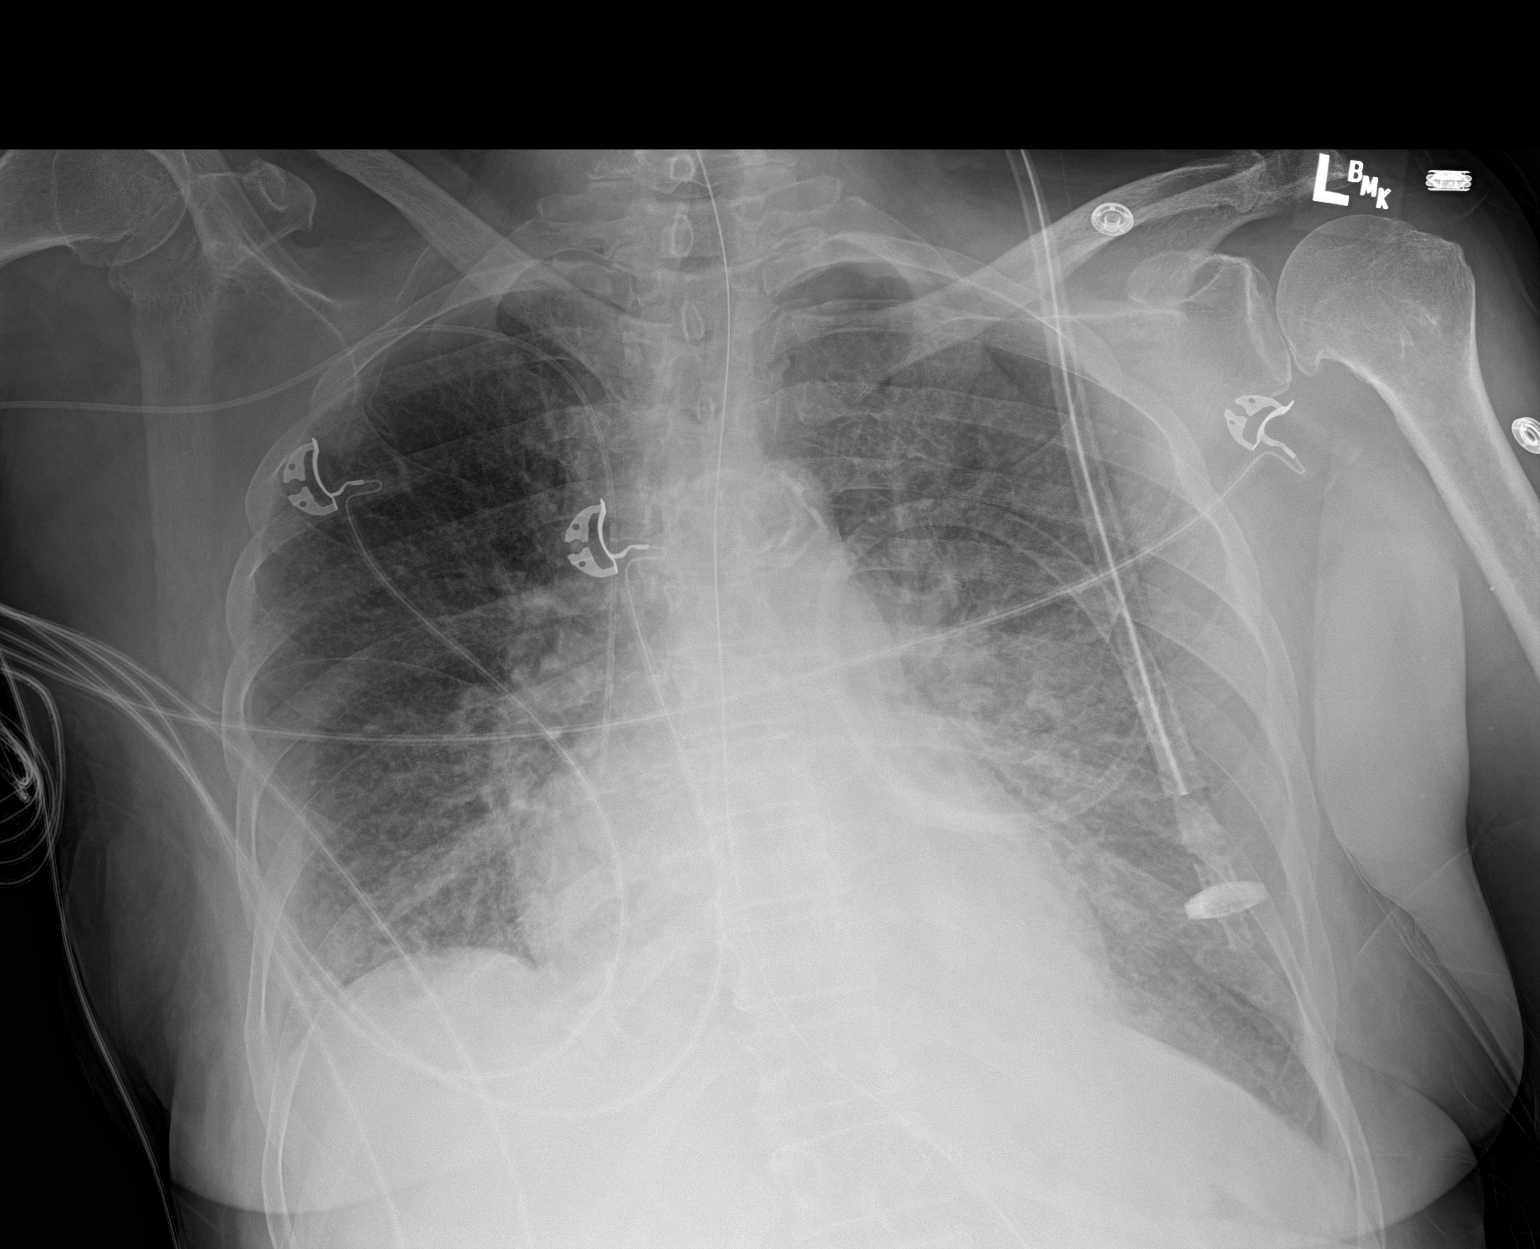

[1 of 1 positions shown; findings below may reference images not displayed]

FINDINGS: Stable cardiomediastinal silhouette with central pulmonary vascular
congestion is noted. Atherosclerosis of thoracic aorta is noted.
Endotracheal tube has been removed. Nasogastric tube is seen
entering the stomach. Right-sided PICC line is unchanged in
position. No pneumothorax or pleural effusion is noted. Stable
bilateral pulmonary edema is noted. Bony thorax is unremarkable.
IMPRESSION: Stable central pulmonary vascular congestion is noted and bilateral
pulmonary edema.

Endotracheal tube has been removed.

Aortic Atherosclerosis ([id number]).

## 2019-07-12 ENCOUNTER — Encounter: Payer: Self-pay | Admitting: Gastroenterology

## 2019-07-12 ENCOUNTER — Ambulatory Visit: Payer: Medicare HMO | Admitting: Gastroenterology

## 2019-07-12 ENCOUNTER — Other Ambulatory Visit: Payer: Self-pay

## 2019-07-12 VITALS — BP 139/64 | HR 81 | Temp 98.1°F | Ht 66.0 in | Wt 167.8 lb

## 2019-07-12 DIAGNOSIS — K269 Duodenal ulcer, unspecified as acute or chronic, without hemorrhage or perforation: Secondary | ICD-10-CM

## 2019-07-12 DIAGNOSIS — R197 Diarrhea, unspecified: Secondary | ICD-10-CM | POA: Diagnosis not present

## 2019-07-12 DIAGNOSIS — Z8719 Personal history of other diseases of the digestive system: Secondary | ICD-10-CM

## 2019-07-12 DIAGNOSIS — Z1211 Encounter for screening for malignant neoplasm of colon: Secondary | ICD-10-CM

## 2019-07-12 NOTE — Progress Notes (Signed)
Vonda Antigua, MD [address]  [place]  [place], [place] [address]  Main: [phone]  Fax: [phone]   Primary Care Physician: Elisabeth Cara, NP   Chief Complaint  Patient presents with  . ER Follow Up Peptic Ulcer    HPI: Brittney [name] is a 78 y.o. female diagnosed with clean-based duodenal bulb ulcer, grade a esophagitis on EGD during her recent hospitalization.  CT on this hospitalization showed duodenitis which led to the EGD.  Patient denies any abdominal pain.  Takes baby aspirin daily and no other NSAIDs.  Compliant with Protonix twice daily prescribed to her after the hospital stay.  No dysphagia.  No weight loss.  No nausea or vomiting.  Does report loose stools, that started even prior to the hospitalization.  2-3 loose stools a day with no blood.  Last colonoscopy 2008, with two 8 mm polyps removed.  Pathology report not available.  A colonoscopy was planned in 2016 but patient was a no-show.  Current Outpatient Medications  Medication Sig Dispense Refill  . acetaminophen (TYLENOL) 325 MG tablet Take 2 tablets (650 mg total) by mouth every 6 (six) hours as needed for mild pain (or Fever >/= 101).    . ALBUTEROL IN Inhale 90 mcg into the lungs every 6 (six) hours as needed (wheezing).    Marland Kitchen amLODipine (NORVASC) 10 MG tablet Take 10 mg by mouth daily.    Marland Kitchen aspirin EC 81 MG EC tablet Take 1 tablet (81 mg total) by mouth daily. 90 tablet 0  . atorvastatin (LIPITOR) 40 MG tablet Take 1 tablet by mouth daily.    . cetirizine (ZYRTEC) 10 MG tablet Take 10 mg by mouth daily.     . Choline Fenofibrate 45 MG capsule Take 90 mg by mouth daily.    . furosemide (LASIX) 40 MG tablet Take 1 tablet (40 mg total) by mouth daily. 30 tablet 1  . glipiZIDE (GLUCOTROL XL) 10 MG 24 hr tablet Take 10 mg by mouth 2 (two) times daily.    . hydrALAZINE (APRESOLINE) 25 MG tablet Take 25 mg by mouth 3 (three) times daily. Taking twice a day    . isosorbide mononitrate  (IMDUR) 30 MG 24 hr tablet Take 30 mg by mouth daily.    Marland Kitchen losartan (COZAAR) 100 MG tablet Take 100 mg by mouth daily.    . metFORMIN (GLUCOPHAGE) 1000 MG tablet Take 1,000 mg by mouth 2 (two) times daily with a meal. Just started back. Not sure how many mg    . metoprolol tartrate (LOPRESSOR) 25 MG tablet Take 1 tablet (25 mg total) by mouth 2 (two) times daily. 60 tablet 0  . pantoprazole (PROTONIX) 40 MG tablet Take 1 tablet (40 mg total) by mouth 2 (two) times daily. 60 tablet 1  . sitaGLIPtin (JANUVIA) 100 MG tablet Take 100 mg by mouth daily.    . Liraglutide (VICTOZA) 18 MG/3ML SOPN Inject 1.8 mg into the skin every morning.     No current facility-administered medications for this visit.     Allergies as of 07/12/2019 - Review Complete 06/26/2019  Allergen Reaction Noted  . Accupril [quinapril hcl]  08/01/2015    ROS:  General: Negative for anorexia, weight loss, fever, chills, fatigue, weakness. ENT: Negative for hoarseness, difficulty swallowing , nasal congestion. CV: Negative for chest pain, angina, palpitations, dyspnea on exertion, peripheral edema.  Respiratory: Negative for dyspnea at rest, dyspnea on exertion, cough, sputum, wheezing.  GI: See history of present  illness. GU:  Negative for dysuria, hematuria, urinary incontinence, urinary frequency, nocturnal urination.  Endo: Negative for unusual weight change.    Physical Examination:   BP 139/64 (BP Location: Right Arm, Patient Position: Sitting, Cuff Size: Normal)   Pulse 81   Temp 98.1 F (36.7 C) (Oral)   Ht 5\' 6"  (1.676 m)   Wt 167 lb 12.8 oz (76.1 kg)   BMI 27.08 kg/m   General: Well-nourished, well-developed in no acute distress.  Eyes: No icterus. Conjunctivae pink. Mouth: Oropharyngeal mucosa moist and pink , no lesions erythema or exudate. Neck: Supple, Trachea midline Abdomen: Bowel sounds are normal, nontender, nondistended, no hepatosplenomegaly or masses, no abdominal bruits or hernia , no  rebound or guarding.   Extremities: No lower extremity edema. No clubbing or deformities. Neuro: Alert and oriented x 3.  Grossly intact. Skin: Warm and dry, no jaundice.   Psych: Alert and cooperative, normal mood and affect.   Labs: CMP     Component Value Date/Time   NA 138 06/26/2019 0519   K 4.0 06/26/2019 0519   CL 108 06/26/2019 0519   CO2 25 06/26/2019 0519   GLUCOSE 253 (H) 06/26/2019 0519   BUN 14 06/26/2019 0519   CREATININE 1.14 (H) 06/26/2019 0519   CALCIUM 8.1 (L) 06/26/2019 0519   PROT 7.4 06/24/2019 1750   ALBUMIN 3.7 06/24/2019 1750   AST 21 06/24/2019 1750   ALT 21 06/24/2019 1750   ALKPHOS 58 06/24/2019 1750   BILITOT 0.9 06/24/2019 1750   GFRNONAA 46 (L) 06/26/2019 0519   GFRAA 53 (L) 06/26/2019 0519   Lab Results  Component Value Date   WBC 9.8 06/26/2019   HGB 9.7 (L) 06/26/2019   HCT 30.0 (L) 06/26/2019   MCV 81.3 06/26/2019   PLT 349 06/26/2019    Imaging Studies: Dg Chest 2 View  Result Date: 06/24/2019 CLINICAL DATA:  Chest pain, epigastric pain, nausea, vomiting, and diarrhea beginning this morning, recently tested and is COVID-19 negative EXAM: CHEST - 2 VIEW COMPARISON:  01/02/2019 FINDINGS: Normal heart size, mediastinal contours, and pulmonary vascularity. Atherosclerotic calcification aorta. Lungs clear. No infiltrate, pleural effusion or pneumothorax. Bones demineralized. IMPRESSION: No acute abnormalities. Electronically Signed   By: Lavonia Dana M.D.   On: 06/24/2019 20:20   Ct Abdomen Pelvis W Contrast  Result Date: 06/24/2019 CLINICAL DATA:  78 y/o F; epigastric pain, nausea, vomiting, diarrhea. EXAM: CT ABDOMEN AND PELVIS WITH CONTRAST TECHNIQUE: Multidetector CT imaging of the abdomen and pelvis was performed using the standard protocol following bolus administration of intravenous contrast. CONTRAST:  127mL OMNIPAQUE IOHEXOL 300 MG/ML  SOLN COMPARISON:  08/05/2018 CT abdomen and pelvis. FINDINGS: Lower chest: No acute abnormality.  Hepatobiliary: No focal liver abnormality is seen. Cholelithiasis. No pericholecystic fluid collection, gallbladder wall thickening, or biliary dilatation. Pancreas: Unremarkable. No pancreatic ductal dilatation or surrounding inflammatory changes. Spleen: Normal in size without focal abnormality. Adrenals/Urinary Tract: Adrenal glands are unremarkable. Small renal cysts bilaterally. Otherwise kidneys are normal, without renal calculi, focal lesion, or hydronephrosis. Bladder is unremarkable. Stomach/Bowel: Severe wall thickening and surrounding edema centered in the proximal duodenum. No extraluminal air to indicate perforation. Appendix not identified, no pericecal inflammation. Otherwise no evidence of bowel wall thickening, distention, or inflammatory changes. Vascular/Lymphatic: Aortic atherosclerosis. No enlarged abdominal or pelvic lymph nodes. Reproductive: Uterus and bilateral adnexa are unremarkable. Other: No abdominal wall hernia or abnormality. No abdominopelvic ascites. Musculoskeletal: No fracture is seen. Lumbar spondylosis with advanced facet arthropathy and L4-5 grade 1 anterolisthesis. IMPRESSION:  1. Severe wall thickening and surrounding edema centered in the proximal duodenum probably representing underlying peptic ulcer disease or possibly infectious/inflammatory duodenitis. No findings of perforation or abscess. 2. Cholelithiasis. 3. Aortic Atherosclerosis (ICD10-I70.0). Electronically Signed   By: Kristine Garbe M.D.   On: 06/24/2019 23:41    Assessment and Plan:   Brittney [name] is a 78 y.o. y/o female with duodenal bulb ulcer  Continue PPI twice daily for 2 months  Avoid NSAIDs except baby aspirin as patient states her cardiologist and PCP placed her on it  Repeat EGD in 3 months to reassess ulcer site, and obtain biopsies of salmon-colored mucosa seen on EGD  Patient educated extensively on acid reflux lifestyle modification, including buying a bed wedge, not  eating 3 hrs before bedtime, diet modifications, and handout given for the same.   Patient has not had a screening colonoscopy since she was 78 years old, and has history of polyps during her 2008 procedure.  Therefore, we extensively discussed that she is above 75, but given that she did not have a colonoscopy just prior to turning 74, there may be polyps in her colon that could grow that can be removed at this time during a screening colonoscopy.  Versus conservative management and no further CRC screening were discussed as well.  Patient prefers to proceed with a screening colonoscopy along with her EGD  It would also allow [place] to obtain biopsies for microscopic colitis.  GI panel for loose stools  I have discussed alternative options, risks & benefits,  which include, but are not limited to, bleeding, infection, perforation,respiratory complication & drug reaction.  The patient agrees with this plan & written consent will be obtained.       Dr Vonda Antigua

## 2019-07-29 LAB — GI PROFILE, STOOL, PCR

## 2019-07-30 ENCOUNTER — Other Ambulatory Visit
Admission: RE | Admit: 2019-07-30 | Discharge: 2019-07-30 | Disposition: A | Discharge status: Home / Self Care | Payer: Medicare HMO | Source: Ambulatory Visit | Attending: Gastroenterology | Admitting: Gastroenterology

## 2019-07-30 ENCOUNTER — Other Ambulatory Visit: Payer: Self-pay

## 2019-07-30 DIAGNOSIS — K269 Duodenal ulcer, unspecified as acute or chronic, without hemorrhage or perforation: Secondary | ICD-10-CM | POA: Diagnosis not present

## 2019-07-30 DIAGNOSIS — Z20828 Contact with and (suspected) exposure to other viral communicable diseases: Secondary | ICD-10-CM | POA: Diagnosis not present

## 2019-07-30 DIAGNOSIS — Z01812 Encounter for preprocedural laboratory examination: Secondary | ICD-10-CM | POA: Diagnosis present

## 2019-07-30 LAB — SARS CORONAVIRUS 2 (TAT 6-24 HRS): SARS Coronavirus 2: NEGATIVE

## 2019-08-01 ENCOUNTER — Other Ambulatory Visit: Payer: Self-pay

## 2019-08-01 DIAGNOSIS — Z8719 Personal history of other diseases of the digestive system: Secondary | ICD-10-CM

## 2019-08-01 DIAGNOSIS — Z1211 Encounter for screening for malignant neoplasm of colon: Secondary | ICD-10-CM

## 2019-08-02 ENCOUNTER — Emergency Department
Admission: EM | Admit: 2019-08-02 | Discharge: 2019-08-02 | Disposition: A | Discharge status: Home / Self Care | Payer: Medicare HMO | Source: Home / Self Care | Attending: Emergency Medicine | Admitting: Emergency Medicine

## 2019-08-02 ENCOUNTER — Ambulatory Visit: Payer: Medicare HMO | Admitting: Anesthesiology

## 2019-08-02 ENCOUNTER — Ambulatory Visit
Admission: RE | Admit: 2019-08-02 | Discharge: 2019-08-02 | Disposition: A | Discharge status: Still In Hospital | Payer: Medicare HMO | Attending: Gastroenterology | Admitting: Gastroenterology

## 2019-08-02 ENCOUNTER — Encounter
Admission: RE | Disposition: A | Discharge status: Still In Hospital | Payer: Self-pay | Source: Home / Self Care | Attending: Gastroenterology

## 2019-08-02 ENCOUNTER — Encounter: Payer: Self-pay | Admitting: *Deleted

## 2019-08-02 ENCOUNTER — Other Ambulatory Visit: Payer: Self-pay

## 2019-08-02 ENCOUNTER — Encounter: Payer: Self-pay | Admitting: Emergency Medicine

## 2019-08-02 DIAGNOSIS — E1122 Type 2 diabetes mellitus with diabetic chronic kidney disease: Secondary | ICD-10-CM | POA: Insufficient documentation

## 2019-08-02 DIAGNOSIS — Z79899 Other long term (current) drug therapy: Secondary | ICD-10-CM | POA: Insufficient documentation

## 2019-08-02 DIAGNOSIS — E1165 Type 2 diabetes mellitus with hyperglycemia: Secondary | ICD-10-CM | POA: Insufficient documentation

## 2019-08-02 DIAGNOSIS — Z7982 Long term (current) use of aspirin: Secondary | ICD-10-CM | POA: Insufficient documentation

## 2019-08-02 DIAGNOSIS — Z7984 Long term (current) use of oral hypoglycemic drugs: Secondary | ICD-10-CM | POA: Insufficient documentation

## 2019-08-02 DIAGNOSIS — N189 Chronic kidney disease, unspecified: Secondary | ICD-10-CM | POA: Insufficient documentation

## 2019-08-02 DIAGNOSIS — Z1211 Encounter for screening for malignant neoplasm of colon: Secondary | ICD-10-CM

## 2019-08-02 DIAGNOSIS — I5032 Chronic diastolic (congestive) heart failure: Secondary | ICD-10-CM | POA: Insufficient documentation

## 2019-08-02 DIAGNOSIS — R739 Hyperglycemia, unspecified: Secondary | ICD-10-CM

## 2019-08-02 DIAGNOSIS — I130 Hypertensive heart and chronic kidney disease with heart failure and stage 1 through stage 4 chronic kidney disease, or unspecified chronic kidney disease: Secondary | ICD-10-CM | POA: Diagnosis not present

## 2019-08-02 DIAGNOSIS — E559 Vitamin D deficiency, unspecified: Secondary | ICD-10-CM | POA: Insufficient documentation

## 2019-08-02 DIAGNOSIS — K219 Gastro-esophageal reflux disease without esophagitis: Secondary | ICD-10-CM | POA: Insufficient documentation

## 2019-08-02 DIAGNOSIS — Z87891 Personal history of nicotine dependence: Secondary | ICD-10-CM | POA: Insufficient documentation

## 2019-08-02 DIAGNOSIS — Z8719 Personal history of other diseases of the digestive system: Secondary | ICD-10-CM

## 2019-08-02 DIAGNOSIS — E785 Hyperlipidemia, unspecified: Secondary | ICD-10-CM | POA: Diagnosis not present

## 2019-08-02 DIAGNOSIS — I509 Heart failure, unspecified: Secondary | ICD-10-CM | POA: Insufficient documentation

## 2019-08-02 DIAGNOSIS — R000 Tachycardia, unspecified: Secondary | ICD-10-CM | POA: Insufficient documentation

## 2019-08-02 HISTORY — PX: COLONOSCOPY WITH PROPOFOL: SHX5780

## 2019-08-02 HISTORY — PX: ESOPHAGOGASTRODUODENOSCOPY (EGD) WITH PROPOFOL: SHX5813

## 2019-08-02 LAB — URINALYSIS, COMPLETE (UACMP) WITH MICROSCOPIC
Bilirubin Urine: NEGATIVE
Glucose, UA: >=500 mg/dL — AB
Hgb urine dipstick: NEGATIVE
Ketones, ur: 5 mg/dL — AB
Leukocytes,Ua: NEGATIVE
Nitrite: NEGATIVE
Protein, ur: 100 mg/dL — AB
Specific Gravity, Urine: 1.026 (ref 1.005–1.030)
pH: 5 (ref 5.0–8.0)

## 2019-08-02 LAB — CBC
HCT: 35.3 % — ABNORMAL LOW (ref 36.0–46.0)
Hemoglobin: 11.8 g/dL — ABNORMAL LOW (ref 12.0–15.0)
MCH: 26.1 pg (ref 26.0–34.0)
MCHC: 33.4 g/dL (ref 30.0–36.0)
MCV: 78.1 fL — ABNORMAL LOW (ref 80.0–100.0)
Platelets: 399 10*3/uL (ref 150–400)
RBC: 4.52 MIL/uL (ref 3.87–5.11)
RDW: 14.8 % (ref 11.5–15.5)
WBC: 8.4 10*3/uL (ref 4.0–10.5)
nRBC: 0 % (ref 0.0–0.2)

## 2019-08-02 LAB — BASIC METABOLIC PANEL
Anion gap: 14 (ref 5–15)
BUN: 13 mg/dL (ref 8–23)
CO2: 26 mmol/L (ref 22–32)
Calcium: 9.1 mg/dL (ref 8.9–10.3)
Chloride: 98 mmol/L (ref 98–111)
Creatinine, Ser: 0.99 mg/dL (ref 0.44–1.00)
GFR calc Af Amer: >60 mL/min (ref >60)
GFR calc non Af Amer: 55 mL/min — ABNORMAL LOW (ref >60)
Glucose, Bld: 465 mg/dL — ABNORMAL HIGH (ref 70–99)
Potassium: 4.3 mmol/L (ref 3.5–5.1)
Sodium: 138 mmol/L (ref 135–145)

## 2019-08-02 LAB — GLUCOSE, CAPILLARY
Glucose-Capillary: 446 mg/dL — ABNORMAL HIGH (ref 70–99)
Glucose-Capillary: 439 mg/dL — ABNORMAL HIGH (ref 70–99)
Glucose-Capillary: 343 mg/dL — ABNORMAL HIGH (ref 70–99)

## 2019-08-02 SURGERY — COLONOSCOPY WITH PROPOFOL
Anesthesia: General

## 2019-08-02 SURGERY — EGD (ESOPHAGOGASTRODUODENOSCOPY)
Anesthesia: General

## 2019-08-02 MED ORDER — SODIUM CHLORIDE 0.9 % IV SOLN: INTRAVENOUS | Status: DC

## 2019-08-02 NOTE — ED Notes (Signed)
Patient denies pain.

## 2019-08-02 NOTE — ED Notes (Signed)
Pt provided with a phone to call her daugther to pick her up.

## 2019-08-02 NOTE — ED Provider Notes (Signed)
[facility] Emergency Department Provider Note ____________________________________________   First MD Initiated Contact with Patient 08/02/19 1616     (approximate)  I have reviewed the triage vital signs and the nursing notes.   HISTORY  Chief Complaint Tachycardia and Hyperglycemia    HPI Brittney [name] is a 78 y.o. female with PMH as noted below including diabetes on metformin, glipizide, and Januvia who presents with tachycardia to the 120s and hyperglycemia noted today when she was being screened by the anesthesiologist for an endoscopy.  Patient states that she fasted since last night and did not take any of her morning medications.  She states that she felt a little bit tired when she was walking to the appointment, but denies symptoms otherwise.  She states that she is hungry now.  She denies any nausea or vomiting, pain, fever, weakness, or other acute symptoms.   Past Medical History:  Diagnosis Date  . Aortic valve stenosis   . Carotid bruit   . CHF (congestive heart failure) ([place])   . Chronic kidney disease   . Diabetes mellitus without complication ([place])   . GERD (gastroesophageal reflux disease)   . Heart murmur   . Hyperlipidemia   . Hypertension   . Pneumonia   . Vitamin D deficiency     Patient Active Problem List   Diagnosis Date Noted  . Reflux esophagitis   . CLE (columnar lined esophagus)   . Duodenitis 06/25/2019  . Chronic heart failure with preserved ejection fraction (HFpEF) ([place]) 02/26/2019  . Benign essential HTN 01/17/2019  . DM (diabetes mellitus) ([place]) 01/17/2019  . Diabetes mellitus type 2, uncontrolled ([place]) 07/24/2015  . GERD without esophagitis 07/24/2015  . Hyperlipidemia 07/24/2015    Past Surgical History:  Procedure Laterality Date  . COLONOSCOPY    . CORONARY ANGIOPLASTY    . ESOPHAGOGASTRODUODENOSCOPY N/A 06/26/2019   Procedure: ESOPHAGOGASTRODUODENOSCOPY (EGD);  Surgeon: Virgel Manifold, MD;   Location: [facility] ENDOSCOPY;  Service: Endoscopy;  Laterality: N/A;  . LEFT HEART CATH AND CORONARY ANGIOGRAPHY N/A 01/23/2019   Procedure: LEFT HEART CATH AND CORONARY ANGIOGRAPHY;  Surgeon: Yolonda Kida, MD;  Location: [place] CV LAB;  Service: Cardiovascular;  Laterality: N/A;  . ovarian cyst removal      Prior to Admission medications   Medication Sig Start Date End Date Taking? Authorizing Provider  acetaminophen (TYLENOL) 325 MG tablet Take 2 tablets (650 mg total) by mouth every 6 (six) hours as needed for mild pain (or Fever >/= 101). 06/26/19   Gouru, Illene Silver, MD  ALBUTEROL IN Inhale 90 mcg into the lungs every 6 (six) hours as needed (wheezing).    [provider]  amLODipine (NORVASC) 10 MG tablet Take 10 mg by mouth daily.    [provider]  aspirin EC 81 MG EC tablet Take 1 tablet (81 mg total) by mouth daily. 06/27/19   Gouru, Illene Silver, MD  atorvastatin (LIPITOR) 40 MG tablet Take 1 tablet by mouth daily. 07/04/18   [provider]  cetirizine (ZYRTEC) 10 MG tablet Take 10 mg by mouth daily.     [provider]  Choline Fenofibrate 45 MG capsule Take 90 mg by mouth daily.    [provider]  furosemide (LASIX) 40 MG tablet Take 1 tablet (40 mg total) by mouth daily. 06/26/19   Gouru, Illene Silver, MD  glipiZIDE (GLUCOTROL XL) 10 MG 24 hr tablet Take 10 mg by mouth 2 (two) times daily.    [provider]  hydrALAZINE (APRESOLINE) 25 MG tablet Take 25 mg by mouth 3 (three) times daily. Taking twice a day    [provider]  isosorbide mononitrate (IMDUR) 30 MG 24 hr tablet Take 30 mg by mouth daily.    [provider]  Liraglutide (VICTOZA) 18 MG/3ML SOPN Inject 1.8 mg into the skin every morning.    [provider]  losartan (COZAAR) 100 MG tablet Take 100 mg by mouth daily.    [provider]  metFORMIN (GLUCOPHAGE) 1000 MG tablet Take 1,000 mg by mouth 2 (two) times daily with a meal. Just started  back. Not sure how many mg    [provider]  metoprolol tartrate (LOPRESSOR) 25 MG tablet Take 1 tablet (25 mg total) by mouth 2 (two) times daily. 06/26/19   Gouru, Illene Silver, MD  pantoprazole (PROTONIX) 40 MG tablet Take 1 tablet (40 mg total) by mouth 2 (two) times daily. 06/26/19   Gouru, Illene Silver, MD  sitaGLIPtin (JANUVIA) 100 MG tablet Take 100 mg by mouth daily.    [provider]    Allergies Accupril [quinapril hcl]  Family History  Problem Relation Age of Onset  . Diabetes Neg Hx   . Hypertension Neg Hx   . Breast cancer Neg Hx     Social History Social History   Tobacco Use  . Smoking status: Former Smoker    Packs/day: 1.00    Years: 40.00    Pack years: 40.00    Types: Cigarettes    Quit date: 07/27/2018    Years since quitting: 1.0  . Smokeless tobacco: Never Used  Substance Use Topics  . Alcohol use: No  . Drug use: No    Review of Systems  Constitutional: No fever. Eyes: No redness. ENT: No sore throat. Cardiovascular: Denies chest pain. Respiratory: Denies shortness of breath. Gastrointestinal: No vomiting or diarrhea.  Genitourinary: Negative for dysuria.  Musculoskeletal: Negative for back pain. Skin: Negative for rash. Neurological: Negative for headache.   ____________________________________________   PHYSICAL EXAM:  VITAL SIGNS: ED Triage Vitals  Enc Vitals Group     BP 08/02/19 1203 (!) 186/69     Pulse Rate 08/02/19 1203 98     Resp 08/02/19 1203 16     Temp 08/02/19 1203 98.5 F (36.9 C)     Temp Source 08/02/19 1203 Oral     SpO2 08/02/19 1203 97 %     Weight --      Height --      Head Circumference --      Peak Flow --      Pain Score 08/02/19 1204 0     Pain Loc --      Pain Edu? --      Excl. in [place]? --     Constitutional: Alert and oriented. Well appearing for age and in no acute distress. Eyes: Conjunctivae are normal.  No scleral icterus. Head: Atraumatic. Nose: No congestion/rhinnorhea.  Mouth/Throat: Mucous membranes are moist.   Neck: Normal range of motion.  Cardiovascular: Normal rate, regular rhythm. Good peripheral circulation. Respiratory: Normal respiratory effort.  No retractions.  Gastrointestinal:  No distention.  Musculoskeletal: No lower extremity edema.  Extremities warm and well perfused.  Neurologic:  Normal speech and language. No gross focal neurologic deficits are appreciated.  Skin:  Skin is warm and dry. No rash noted. Psychiatric: Mood and affect are normal. Speech and behavior are normal.  ____________________________________________   LABS (all labs ordered are listed, but only  abnormal results are displayed)  Labs Reviewed  BASIC METABOLIC PANEL - Abnormal; Notable for the following components:      Result Value   Glucose, Bld 465 (*)    GFR calc non Af Amer 55 (*)    All other components within normal limits  CBC - Abnormal; Notable for the following components:   Hemoglobin 11.8 (*)    HCT 35.3 (*)    MCV 78.1 (*)    All other components within normal limits  URINALYSIS, COMPLETE (UACMP) WITH MICROSCOPIC - Abnormal; Notable for the following components:   Color, Urine YELLOW (*)    APPearance CLEAR (*)    Glucose, UA >=500 (*)    Ketones, ur 5 (*)    Protein, ur 100 (*)    Bacteria, UA RARE (*)    All other components within normal limits  GLUCOSE, CAPILLARY - Abnormal; Notable for the following components:   Glucose-Capillary 439 (*)    All other components within normal limits  GLUCOSE, CAPILLARY - Abnormal; Notable for the following components:   Glucose-Capillary 343 (*)    All other components within normal limits  CBG MONITORING, ED   ____________________________________________  EKG   ____________________________________________  RADIOLOGY    ____________________________________________   PROCEDURES  Procedure(s) performed: No  Procedures  Critical Care performed: No  ____________________________________________   INITIAL IMPRESSION / ASSESSMENT AND PLAN / ED COURSE  Pertinent labs & imaging results that were available during my care of the patient were reviewed by me and considered in my medical decision making (see chart for details).  78 year old female presents for evaluation of hyperglycemia and tachycardia which were noted when she was being evaluated by the anesthesiologist for an endoscopy today.  The patient states that she felt a little weak when she was arriving to the appointment, and had fasted since last night and not taking her medications.  However, she denies any symptoms at this time.  I reviewed the past medical records in epic and I was able to see the note from Dr. Kayleen Memos and Dr. Bonna Gains from this morning, confirming the above history.  The pulse was noted to be 127, although I do not see the glucose reading recorded anywhere.  On exam currently, the patient is overall very well-appearing.  She is slightly hypertensive with otherwise normal vital signs.  She has not been tachycardic during her ED stay.  Lab work-up was obtained prior to my seeing the patient.  The labs reveal hyperglycemia to the 400s but minimal ketones in the urine, no anion gap, and otherwise normal chemistry and CBC.  Overall I suspect that the patient was slightly tachycardic and due to dehydration or hypovolemia since she has fasted since last night, and hyperglycemic after not having taken her medicines this morning.  After I examined the patient we obtained a repeat fingerstick and it was in the 300s.  Patient states that she feels very well and wants to go home.  There is no evidence of DKA or other acute complication.  This patient is stable for discharge home with a plan to continue her home medications.  She feels comfortable with this.  I gave her thorough return precautions and she expressed understanding.  She will contact Dr. Michele Mcalpine office to  reschedule the procedure.  ____________________________________________   FINAL CLINICAL IMPRESSION(S) / ED DIAGNOSES  Final diagnoses:  Hyperglycemia      NEW MEDICATIONS STARTED DURING THIS VISIT:  Discharge Medication List as of 08/02/2019  7:44 PM  Note:  This document was prepared using Dragon voice recognition software and may include unintentional dictation errors.   Arta Silence, MD 08/02/19 2031

## 2019-08-02 NOTE — OR Nursing (Signed)
Presented to Endo for EGD and colonoscopy.  HR elevated upon arrival. Blood sugar 446. States not c/o of SOB or chest pain. Dr. Kayleen Memos and Dr. Bonna Gains notified. Procedures canceled and patient transferred to ED triage area per ED charge nurse via wheelchair.

## 2019-08-02 NOTE — ED Notes (Signed)
Pt ambulatory to toilet before DC.

## 2019-08-02 NOTE — Anesthesia Preprocedure Evaluation (Deleted)
Anesthesia Evaluation  Patient identified by MRN, date of birth, ID band Patient awake    Reviewed: Allergy & Precautions, H&P , NPO status , Patient's Chart, lab work & pertinent test results, reviewed documented beta blocker date and time   Airway Mallampati: II   Neck ROM: full    Dental  (+) Poor Dentition   Pulmonary neg pulmonary ROS, pneumonia, former smoker,    Pulmonary exam normal        Cardiovascular Exercise Tolerance: Poor hypertension, On Medications +CHF  negative cardio ROS Normal cardiovascular exam+ Valvular Problems/Murmurs  Rhythm:regular Rate:Normal     Neuro/Psych negative neurological ROS  negative psych ROS   GI/Hepatic negative GI ROS, Neg liver ROS, GERD  ,  Endo/Other  negative endocrine ROSdiabetes  Renal/GU Renal diseasenegative Renal ROS  negative genitourinary   Musculoskeletal   Abdominal   Peds  Hematology negative hematology ROS (+)   Anesthesia Other Findings Past Medical History: No date: Aortic valve stenosis No date: Carotid bruit No date: CHF (congestive heart failure) (HCC) No date: Chronic kidney disease No date: Diabetes mellitus without complication (HCC) No date: GERD (gastroesophageal reflux disease) No date: Heart murmur No date: Hyperlipidemia No date: Hypertension No date: Pneumonia No date: Vitamin D deficiency Past Surgical History: No date: COLONOSCOPY 01/23/2019: LEFT HEART CATH AND CORONARY ANGIOGRAPHY; N/A     Comment:  Procedure: LEFT HEART CATH AND CORONARY ANGIOGRAPHY;                Surgeon: Yolonda Kida, MD;  Location: [place]              CV LAB;  Service: Cardiovascular;  Laterality: N/A; No date: ovarian cyst removal BMI    Body Mass Index: 29.93 kg/m     Reproductive/Obstetrics negative OB ROS                             Anesthesia Physical  Anesthesia Plan  ASA: III  Anesthesia Plan: General    Post-op Pain Management:    Induction:   PONV Risk Score and Plan:   Airway Management Planned: Nasal Cannula  Additional Equipment:   Intra-op Plan:   Post-operative Plan:   Informed Consent: I have reviewed the patients History and Physical, chart, labs and discussed the procedure including the risks, benefits and alternatives for the proposed anesthesia with the patient or authorized representative who has indicated his/her understanding and acceptance.     Dental Advisory Given  Plan Discussed with: CRNA  Anesthesia Plan Comments:         Anesthesia Quick Evaluation

## 2019-08-02 NOTE — ED Notes (Addendum)
POC blood glucose = 439.

## 2019-08-02 NOTE — ED Notes (Signed)
Pt ambulated to toilet with a steady gait.  

## 2019-08-02 NOTE — ED Notes (Signed)
Pt st not taking her "morning medication".

## 2019-08-02 NOTE — ED Notes (Signed)
MD Siadecki made aware of CBG.

## 2019-08-02 NOTE — ED Notes (Signed)
Pt provided with graham crackers and water per pt's requests.

## 2019-08-02 NOTE — H&P (Addendum)
Vonda Antigua, MD [address] [place], [place], [place], [address] [address], [place], [place], [place], [address] Phone: [phone]  Fax: [phone]  Primary Care Physician:  Elisabeth Cara, NP   Pre-Procedure History & Physical: HPI:  Brittney [name] is a 79 y.o. female is here for a colonoscopy and EGD.   Past Medical History:  Diagnosis Date  . Aortic valve stenosis   . Carotid bruit   . CHF (congestive heart failure) ([place])   . Chronic kidney disease   . Diabetes mellitus without complication ([place])   . GERD (gastroesophageal reflux disease)   . Heart murmur   . Hyperlipidemia   . Hypertension   . Pneumonia   . Vitamin D deficiency     Past Surgical History:  Procedure Laterality Date  . COLONOSCOPY    . CORONARY ANGIOPLASTY    . ESOPHAGOGASTRODUODENOSCOPY N/A 06/26/2019   Procedure: ESOPHAGOGASTRODUODENOSCOPY (EGD);  Surgeon: Virgel Manifold, MD;  Location: [facility] ENDOSCOPY;  Service: Endoscopy;  Laterality: N/A;  . LEFT HEART CATH AND CORONARY ANGIOGRAPHY N/A 01/23/2019   Procedure: LEFT HEART CATH AND CORONARY ANGIOGRAPHY;  Surgeon: Yolonda Kida, MD;  Location: [place] CV LAB;  Service: Cardiovascular;  Laterality: N/A;  . ovarian cyst removal      Prior to Admission medications   Medication Sig Start Date End Date Taking? Authorizing Provider  amLODipine (NORVASC) 10 MG tablet Take 10 mg by mouth daily.   Yes [provider]  atorvastatin (LIPITOR) 40 MG tablet Take 1 tablet by mouth daily. 07/04/18  Yes [provider]  cetirizine (ZYRTEC) 10 MG tablet Take 10 mg by mouth daily.    Yes [provider]  Choline Fenofibrate 45 MG capsule Take 90 mg by mouth daily.   Yes [provider]  furosemide (LASIX) 40 MG tablet Take 1 tablet (40 mg total) by mouth daily. 06/26/19  Yes Gouru, Aruna, MD  glipiZIDE (GLUCOTROL XL) 10 MG 24 hr tablet Take 10 mg by mouth 2 (two) times daily.   Yes [provider]  hydrALAZINE (APRESOLINE) 25 MG tablet Take 25 mg by mouth 3 (three) times daily. Taking twice a day   Yes [provider]  isosorbide mononitrate (IMDUR) 30 MG 24 hr tablet Take 30 mg by mouth daily.   Yes [provider]  losartan (COZAAR) 100 MG tablet Take 100 mg by mouth daily.   Yes [provider]  metFORMIN (GLUCOPHAGE) 1000 MG tablet Take 1,000 mg by mouth 2 (two) times daily with a meal. Just started back. Not sure how many mg   Yes [provider]  metoprolol tartrate (LOPRESSOR) 25 MG tablet Take 1 tablet (25 mg total) by mouth 2 (two) times daily. 06/26/19  Yes Gouru, Illene Silver, MD  pantoprazole (PROTONIX) 40 MG tablet Take 1 tablet (40 mg total) by mouth 2 (two) times daily. 06/26/19  Yes Gouru, Illene Silver, MD  acetaminophen (TYLENOL) 325 MG tablet Take 2 tablets (650 mg total) by mouth every 6 (six) hours as needed for mild pain (or Fever >/= 101). 06/26/19   Gouru, Illene Silver, MD  ALBUTEROL IN Inhale 90 mcg into the lungs every 6 (six) hours as needed (wheezing).    [provider]  aspirin EC 81 MG EC tablet Take 1 tablet (81 mg total) by mouth daily. 06/27/19   Gouru, Illene Silver, MD  Liraglutide (VICTOZA) 18 MG/3ML SOPN Inject 1.8 mg into the skin every morning.    [provider]  sitaGLIPtin Cel[place] Gent[place])  100 MG tablet Take 100 mg by mouth daily.    [provider]    Allergies as of 07/12/2019 - Review Complete 06/26/2019  Allergen Reaction Noted  . Accupril [quinapril hcl]  08/01/2015    Family History  Problem Relation Age of Onset  . Diabetes Neg Hx   . Hypertension Neg Hx   . Breast cancer Neg Hx     Social History   Socioeconomic History  . Marital status: Widowed    Spouse name: Not on file  . Number of children: Not on file  . Years of education: Not on file  . Highest education level: Not on file  Occupational History  . Not on file  Social Needs  . Financial resource strain: Not on file  . Food  insecurity    Worry: Not on file    Inability: Not on file  . Transportation needs    Medical: Not on file    Non-medical: Not on file  Tobacco Use  . Smoking status: Former Smoker    Packs/day: 1.00    Years: 40.00    Pack years: 40.00    Types: Cigarettes    Quit date: 07/27/2018    Years since quitting: 1.0  . Smokeless tobacco: Never Used  Substance and Sexual Activity  . Alcohol use: No  . Drug use: No  . Sexual activity: Not on file  Lifestyle  . Physical activity    Days per week: Not on file    Minutes per session: Not on file  . Stress: Not on file  Relationships  . Social Herbalist on phone: Not on file    Gets together: Not on file    Attends religious service: Not on file    Active member of club or organization: Not on file    Attends meetings of clubs or organizations: Not on file    Relationship status: Not on file  . Intimate partner violence    Fear of current or ex partner: Not on file    Emotionally abused: Not on file    Physically abused: Not on file    Forced sexual activity: Not on file  Other Topics Concern  . Not on file  Social History Narrative  . Not on file    Review of Systems: See HPI, otherwise negative ROS  Physical Exam: BP (!) 154/76   Pulse (!) 127   Temp (!) 96.6 F (35.9 C) (Tympanic)   Resp 16   Ht 5\' 4"  (1.626 m)   Wt 77.1 kg   SpO2 98%   BMI 29.18 kg/m  General:   Alert,  pleasant and cooperative in NAD Head:  Normocephalic and atraumatic. Neck:  Supple; no masses or thyromegaly. Lungs:  Clear throughout to auscultation, normal respiratory effort.    Heart:  +S1, +S2, Regular rate and rhythm, No edema. Abdomen:  Soft, nontender and nondistended. Normal bowel sounds, without guarding, and without rebound.   Neurologic:  Alert and  oriented x4;  grossly normal neurologically.  Impression/Plan: Brittney [name] is here for a colonoscopy to be performed for average risk screening and EGD for duodenal  ulcer, biopsies for barretts esophagus.  Risks, benefits, limitations, and alternatives regarding the procedures have been reviewed with the patient.  Questions have been answered.  All parties agreeable.   Dr. Kayleen Memos from anesthesia informed me that due to patient's elevated blood sugar and heart rate he may elect to not do the procedure today  and send the patient to the ER instead after evaluating her and obtaining and EKG.    Virgel Manifold, MD  08/02/2019, 10:59 AM

## 2019-08-02 NOTE — ED Triage Notes (Signed)
Pt brought over from pre-admission testing with c/o tachycardia and hyperglycemia per pt. HR 98 at the time of triage, NAD, denies complaints.

## 2019-08-02 NOTE — Discharge Instructions (Addendum)
Take all of your evening medications today and all of your other normal medications as prescribed.  You should call the gastroenterologist office tomorrow to reschedule your procedure.  Return to the ER for nausea or vomiting, fever, weakness, palpitations, or any other new or worsening symptoms that concern you.

## 2019-08-03 ENCOUNTER — Encounter: Payer: Self-pay | Admitting: Gastroenterology

## 2019-08-03 ENCOUNTER — Ambulatory Visit: Payer: Medicare HMO | Admitting: Family

## 2020-02-01 ENCOUNTER — Encounter: Payer: Self-pay | Admitting: Emergency Medicine

## 2020-02-01 ENCOUNTER — Inpatient Hospital Stay: Payer: Medicare HMO

## 2020-02-01 ENCOUNTER — Inpatient Hospital Stay
Admission: EM | Admit: 2020-02-01 | Discharge: 2020-02-03 | DRG: 291 | Disposition: A | Discharge status: Home / Self Care | Payer: Medicare HMO | Attending: Internal Medicine | Admitting: Internal Medicine

## 2020-02-01 ENCOUNTER — Other Ambulatory Visit: Payer: Self-pay

## 2020-02-01 ENCOUNTER — Emergency Department: Payer: Medicare HMO

## 2020-02-01 DIAGNOSIS — Z20822 Contact with and (suspected) exposure to covid-19: Secondary | ICD-10-CM | POA: Diagnosis present

## 2020-02-01 DIAGNOSIS — I5023 Acute on chronic systolic (congestive) heart failure: Secondary | ICD-10-CM | POA: Diagnosis not present

## 2020-02-01 DIAGNOSIS — Z7984 Long term (current) use of oral hypoglycemic drugs: Secondary | ICD-10-CM

## 2020-02-01 DIAGNOSIS — Z7982 Long term (current) use of aspirin: Secondary | ICD-10-CM | POA: Diagnosis not present

## 2020-02-01 DIAGNOSIS — Z87891 Personal history of nicotine dependence: Secondary | ICD-10-CM | POA: Diagnosis not present

## 2020-02-01 DIAGNOSIS — Z9861 Coronary angioplasty status: Secondary | ICD-10-CM | POA: Diagnosis not present

## 2020-02-01 DIAGNOSIS — Z794 Long term (current) use of insulin: Secondary | ICD-10-CM | POA: Diagnosis not present

## 2020-02-01 DIAGNOSIS — R778 Other specified abnormalities of plasma proteins: Secondary | ICD-10-CM | POA: Diagnosis not present

## 2020-02-01 DIAGNOSIS — I509 Heart failure, unspecified: Secondary | ICD-10-CM

## 2020-02-01 DIAGNOSIS — I248 Other forms of acute ischemic heart disease: Secondary | ICD-10-CM | POA: Diagnosis present

## 2020-02-01 DIAGNOSIS — I350 Nonrheumatic aortic (valve) stenosis: Secondary | ICD-10-CM | POA: Diagnosis present

## 2020-02-01 DIAGNOSIS — E119 Type 2 diabetes mellitus without complications: Secondary | ICD-10-CM

## 2020-02-01 DIAGNOSIS — Z888 Allergy status to other drugs, medicaments and biological substances status: Secondary | ICD-10-CM | POA: Diagnosis not present

## 2020-02-01 DIAGNOSIS — I5033 Acute on chronic diastolic (congestive) heart failure: Secondary | ICD-10-CM | POA: Diagnosis present

## 2020-02-01 DIAGNOSIS — Z79899 Other long term (current) drug therapy: Secondary | ICD-10-CM

## 2020-02-01 DIAGNOSIS — I2510 Atherosclerotic heart disease of native coronary artery without angina pectoris: Secondary | ICD-10-CM | POA: Diagnosis present

## 2020-02-01 DIAGNOSIS — D631 Anemia in chronic kidney disease: Secondary | ICD-10-CM | POA: Diagnosis present

## 2020-02-01 DIAGNOSIS — N182 Chronic kidney disease, stage 2 (mild): Secondary | ICD-10-CM

## 2020-02-01 DIAGNOSIS — I130 Hypertensive heart and chronic kidney disease with heart failure and stage 1 through stage 4 chronic kidney disease, or unspecified chronic kidney disease: Secondary | ICD-10-CM | POA: Diagnosis present

## 2020-02-01 DIAGNOSIS — J449 Chronic obstructive pulmonary disease, unspecified: Secondary | ICD-10-CM | POA: Diagnosis present

## 2020-02-01 DIAGNOSIS — N179 Acute kidney failure, unspecified: Secondary | ICD-10-CM | POA: Diagnosis present

## 2020-02-01 DIAGNOSIS — Z8701 Personal history of pneumonia (recurrent): Secondary | ICD-10-CM

## 2020-02-01 DIAGNOSIS — K219 Gastro-esophageal reflux disease without esophagitis: Secondary | ICD-10-CM | POA: Diagnosis present

## 2020-02-01 DIAGNOSIS — E1122 Type 2 diabetes mellitus with diabetic chronic kidney disease: Secondary | ICD-10-CM | POA: Diagnosis present

## 2020-02-01 DIAGNOSIS — E785 Hyperlipidemia, unspecified: Secondary | ICD-10-CM | POA: Diagnosis present

## 2020-02-01 DIAGNOSIS — N1831 Chronic kidney disease, stage 3a: Secondary | ICD-10-CM | POA: Diagnosis present

## 2020-02-01 DIAGNOSIS — I10 Essential (primary) hypertension: Secondary | ICD-10-CM

## 2020-02-01 LAB — HEMOGLOBIN A1C
Hgb A1c MFr Bld: 8.4 % — ABNORMAL HIGH (ref 4.8–5.6)
Mean Plasma Glucose: 194.38 mg/dL

## 2020-02-01 LAB — URINALYSIS, ROUTINE W REFLEX MICROSCOPIC
Bilirubin Urine: NEGATIVE
Glucose, UA: 50 mg/dL — AB
Hgb urine dipstick: NEGATIVE
Ketones, ur: NEGATIVE mg/dL
Leukocytes,Ua: NEGATIVE
Nitrite: NEGATIVE
Protein, ur: 100 mg/dL — AB
Specific Gravity, Urine: 1.01 (ref 1.005–1.030)
pH: 6 (ref 5.0–8.0)

## 2020-02-01 LAB — CBC
HCT: 30.9 % — ABNORMAL LOW (ref 36.0–46.0)
Hemoglobin: 9.9 g/dL — ABNORMAL LOW (ref 12.0–15.0)
MCH: 26.1 pg (ref 26.0–34.0)
MCHC: 32 g/dL (ref 30.0–36.0)
MCV: 81.3 fL (ref 80.0–100.0)
Platelets: 362 10*3/uL (ref 150–400)
RBC: 3.8 MIL/uL — ABNORMAL LOW (ref 3.87–5.11)
RDW: 14.6 % (ref 11.5–15.5)
WBC: 13.5 10*3/uL — ABNORMAL HIGH (ref 4.0–10.5)
nRBC: 0 % (ref 0.0–0.2)

## 2020-02-01 LAB — DIFFERENTIAL
Abs Immature Granulocytes: 0.11 10*3/uL — ABNORMAL HIGH (ref 0.00–0.07)
Basophils Absolute: 0.1 10*3/uL (ref 0.0–0.1)
Basophils Relative: 0 %
Eosinophils Absolute: 0.2 10*3/uL (ref 0.0–0.5)
Eosinophils Relative: 1 %
Immature Granulocytes: 1 %
Lymphocytes Relative: 17 %
Lymphs Abs: 2.3 10*3/uL (ref 0.7–4.0)
Monocytes Absolute: 0.9 10*3/uL (ref 0.1–1.0)
Monocytes Relative: 6 %
Neutro Abs: 10 10*3/uL — ABNORMAL HIGH (ref 1.7–7.7)
Neutrophils Relative %: 75 %

## 2020-02-01 LAB — RESPIRATORY PANEL BY RT PCR (FLU A&B, COVID)
Influenza A by PCR: NEGATIVE
Influenza B by PCR: NEGATIVE
SARS Coronavirus 2 by RT PCR: NEGATIVE

## 2020-02-01 LAB — COMPREHENSIVE METABOLIC PANEL
ALT: 13 U/L (ref 0–44)
AST: 19 U/L (ref 15–41)
Albumin: 3.6 g/dL (ref 3.5–5.0)
Alkaline Phosphatase: 46 U/L (ref 38–126)
Anion gap: 15 (ref 5–15)
BUN: 29 mg/dL — ABNORMAL HIGH (ref 8–23)
CO2: 19 mmol/L — ABNORMAL LOW (ref 22–32)
Calcium: 8.8 mg/dL — ABNORMAL LOW (ref 8.9–10.3)
Chloride: 105 mmol/L (ref 98–111)
Creatinine, Ser: 1.48 mg/dL — ABNORMAL HIGH (ref 0.44–1.00)
GFR calc Af Amer: 39 mL/min — ABNORMAL LOW (ref >60)
GFR calc non Af Amer: 33 mL/min — ABNORMAL LOW (ref >60)
Glucose, Bld: 307 mg/dL — ABNORMAL HIGH (ref 70–99)
Potassium: 3.7 mmol/L (ref 3.5–5.1)
Sodium: 139 mmol/L (ref 135–145)
Total Bilirubin: 0.7 mg/dL (ref 0.3–1.2)
Total Protein: 6.9 g/dL (ref 6.5–8.1)

## 2020-02-01 LAB — GLUCOSE, CAPILLARY
Glucose-Capillary: 219 mg/dL — ABNORMAL HIGH (ref 70–99)
Glucose-Capillary: 225 mg/dL — ABNORMAL HIGH (ref 70–99)

## 2020-02-01 LAB — TROPONIN I (HIGH SENSITIVITY)
Troponin I (High Sensitivity): 38 ng/L — ABNORMAL HIGH (ref <18)
Troponin I (High Sensitivity): 77 ng/L — ABNORMAL HIGH (ref <18)

## 2020-02-01 LAB — PROTEIN / CREATININE RATIO, URINE
Creatinine, Urine: 24 mg/dL
Protein Creatinine Ratio: 8.79 mg/mg{Cre} — ABNORMAL HIGH (ref 0.00–0.15)
Total Protein, Urine: 211 mg/dL

## 2020-02-01 LAB — BRAIN NATRIURETIC PEPTIDE: B Natriuretic Peptide: 190 pg/mL — ABNORMAL HIGH (ref 0.0–100.0)

## 2020-02-01 LAB — POC SARS CORONAVIRUS 2 AG: SARS Coronavirus 2 Ag: NEGATIVE

## 2020-02-01 MED ORDER — INSULIN GLARGINE 100 UNIT/ML SOLOSTAR PEN: 16.0000 [IU] | PEN_INJECTOR | Freq: Every day | SUBCUTANEOUS | Status: DC

## 2020-02-01 MED ORDER — AMLODIPINE BESYLATE 5 MG PO TABS: 10.0000 mg | ORAL_TABLET | Freq: Every day | ORAL | Status: DC

## 2020-02-01 MED ORDER — SODIUM CHLORIDE 0.9% FLUSH
3.0000 mL | Freq: Two times a day (BID) | INTRAVENOUS | Status: DC
  Administered 2020-02-01 – 2020-02-03 (×5): 3 mL via INTRAVENOUS

## 2020-02-01 MED ORDER — ASPIRIN 81 MG PO CHEW
324.0000 mg | CHEWABLE_TABLET | Freq: Once | ORAL | Status: AC
  Administered 2020-02-01: 324 mg via ORAL
  Filled 2020-02-01: qty 4

## 2020-02-01 MED ORDER — ASPIRIN EC 81 MG PO TBEC
81.0000 mg | DELAYED_RELEASE_TABLET | Freq: Every day | ORAL | Status: DC
  Administered 2020-02-01 – 2020-02-03 (×3): 81 mg via ORAL
  Filled 2020-02-01 (×3): qty 1

## 2020-02-01 MED ORDER — ACETAMINOPHEN 325 MG PO TABS: 650.0000 mg | ORAL_TABLET | Freq: Four times a day (QID) | ORAL | Status: DC | PRN

## 2020-02-01 MED ORDER — METOPROLOL TARTRATE 25 MG PO TABS: 25.0000 mg | ORAL_TABLET | Freq: Two times a day (BID) | ORAL | Status: DC

## 2020-02-01 MED ORDER — SODIUM CHLORIDE 0.9 % IV SOLN: 250.0000 mL | INTRAVENOUS | Status: DC | PRN

## 2020-02-01 MED ORDER — ISOSORBIDE MONONITRATE ER 60 MG PO TB24: 30.0000 mg | ORAL_TABLET | Freq: Every day | ORAL | Status: DC

## 2020-02-01 MED ORDER — HYDRALAZINE HCL 25 MG PO TABS
25.0000 mg | ORAL_TABLET | Freq: Three times a day (TID) | ORAL | Status: DC
  Administered 2020-02-01 – 2020-02-03 (×6): 25 mg via ORAL
  Filled 2020-02-01 (×6): qty 1

## 2020-02-01 MED ORDER — ATORVASTATIN CALCIUM 20 MG PO TABS: 40.0000 mg | ORAL_TABLET | Freq: Every day | ORAL | Status: DC

## 2020-02-01 MED ORDER — HEPARIN SODIUM (PORCINE) 5000 UNIT/ML IJ SOLN
5000.0000 [IU] | Freq: Three times a day (TID) | INTRAMUSCULAR | Status: DC
  Administered 2020-02-01 – 2020-02-03 (×5): 5000 [IU] via SUBCUTANEOUS
  Filled 2020-02-01 (×5): qty 1

## 2020-02-01 MED ORDER — FENOFIBRATE 54 MG PO TABS
54.0000 mg | ORAL_TABLET | Freq: Every day | ORAL | Status: DC
  Administered 2020-02-01 – 2020-02-03 (×3): 54 mg via ORAL
  Filled 2020-02-01 (×4): qty 1

## 2020-02-01 MED ORDER — INSULIN GLARGINE 100 UNIT/ML ~~LOC~~ SOLN
16.0000 [IU] | Freq: Every day | SUBCUTANEOUS | Status: DC
  Administered 2020-02-01 – 2020-02-02 (×2): 16 [IU] via SUBCUTANEOUS
  Filled 2020-02-01 (×3): qty 0.16

## 2020-02-01 MED ORDER — INSULIN ASPART 100 UNIT/ML ~~LOC~~ SOLN
0.0000 [IU] | Freq: Three times a day (TID) | SUBCUTANEOUS | Status: DC
  Administered 2020-02-01: 3 [IU] via SUBCUTANEOUS
  Administered 2020-02-02: 7 [IU] via SUBCUTANEOUS
  Administered 2020-02-02: 3 [IU] via SUBCUTANEOUS
  Administered 2020-02-02: 2 [IU] via SUBCUTANEOUS
  Administered 2020-02-03: 5 [IU] via SUBCUTANEOUS
  Filled 2020-02-01 (×6): qty 1

## 2020-02-01 MED ORDER — FUROSEMIDE 10 MG/ML IJ SOLN
40.0000 mg | Freq: Two times a day (BID) | INTRAMUSCULAR | Status: DC
  Administered 2020-02-01: 40 mg via INTRAVENOUS
  Filled 2020-02-01: qty 4

## 2020-02-01 MED ORDER — AMLODIPINE BESYLATE 10 MG PO TABS
10.0000 mg | ORAL_TABLET | Freq: Every day | ORAL | Status: DC
  Administered 2020-02-01 – 2020-02-03 (×3): 10 mg via ORAL
  Filled 2020-02-01 (×3): qty 1

## 2020-02-01 MED ORDER — ATORVASTATIN CALCIUM 20 MG PO TABS
40.0000 mg | ORAL_TABLET | Freq: Every day | ORAL | Status: DC
  Administered 2020-02-01 – 2020-02-02 (×2): 40 mg via ORAL
  Filled 2020-02-01 (×2): qty 2

## 2020-02-01 MED ORDER — ADULT MULTIVITAMIN W/MINERALS CH
1.0000 | ORAL_TABLET | Freq: Every day | ORAL | Status: DC
  Administered 2020-02-01 – 2020-02-03 (×3): 1 via ORAL
  Filled 2020-02-01 (×3): qty 1

## 2020-02-01 MED ORDER — METOPROLOL TARTRATE 50 MG PO TABS
50.0000 mg | ORAL_TABLET | Freq: Two times a day (BID) | ORAL | Status: DC
  Administered 2020-02-01 – 2020-02-03 (×5): 50 mg via ORAL
  Filled 2020-02-01 (×5): qty 1

## 2020-02-01 MED ORDER — ASPIRIN EC 81 MG PO TBEC: 81.0000 mg | DELAYED_RELEASE_TABLET | Freq: Every day | ORAL | Status: DC

## 2020-02-01 MED ORDER — ONDANSETRON HCL 4 MG/2ML IJ SOLN: 4.0000 mg | Freq: Four times a day (QID) | INTRAMUSCULAR | Status: DC | PRN

## 2020-02-01 MED ORDER — FUROSEMIDE 10 MG/ML IJ SOLN
40.0000 mg | Freq: Once | INTRAMUSCULAR | Status: AC
  Administered 2020-02-01: 40 mg via INTRAVENOUS
  Filled 2020-02-01: qty 4

## 2020-02-01 MED ORDER — PANTOPRAZOLE SODIUM 40 MG PO TBEC
40.0000 mg | DELAYED_RELEASE_TABLET | Freq: Two times a day (BID) | ORAL | Status: DC
  Administered 2020-02-01 – 2020-02-03 (×5): 40 mg via ORAL
  Filled 2020-02-01 (×5): qty 1

## 2020-02-01 MED ORDER — LORATADINE 10 MG PO TABS
10.0000 mg | ORAL_TABLET | Freq: Every day | ORAL | Status: DC
  Administered 2020-02-01 – 2020-02-03 (×3): 10 mg via ORAL
  Filled 2020-02-01 (×3): qty 1

## 2020-02-01 MED ORDER — ISOSORBIDE MONONITRATE ER 30 MG PO TB24
30.0000 mg | ORAL_TABLET | Freq: Every day | ORAL | Status: DC
  Administered 2020-02-01 – 2020-02-03 (×3): 30 mg via ORAL
  Filled 2020-02-01 (×3): qty 1

## 2020-02-01 MED ORDER — LOSARTAN POTASSIUM 50 MG PO TABS: 100.0000 mg | ORAL_TABLET | Freq: Every day | ORAL | Status: DC

## 2020-02-01 MED ORDER — ACETAMINOPHEN 325 MG PO TABS: 650.0000 mg | ORAL_TABLET | ORAL | Status: DC | PRN

## 2020-02-01 MED ORDER — SODIUM CHLORIDE 0.9% FLUSH: 3.0000 mL | INTRAVENOUS | Status: DC | PRN

## 2020-02-01 MED ORDER — POTASSIUM CHLORIDE CRYS ER 10 MEQ PO TBCR
10.0000 meq | EXTENDED_RELEASE_TABLET | Freq: Every day | ORAL | Status: DC
  Administered 2020-02-01 – 2020-02-03 (×3): 10 meq via ORAL
  Filled 2020-02-01 (×3): qty 1

## 2020-02-01 MED ORDER — IPRATROPIUM-ALBUTEROL 0.5-2.5 (3) MG/3ML IN SOLN
3.0000 mL | Freq: Once | RESPIRATORY_TRACT | Status: AC
  Administered 2020-02-01: 04:00:00 3 mL via RESPIRATORY_TRACT
  Filled 2020-02-01: qty 3

## 2020-02-01 NOTE — ED Provider Notes (Signed)
[facility] Emergency Department Provider Note  ____________________________________________   First MD Initiated Contact with Patient 02/01/20 [phone]     (approximate)  I have reviewed the triage vital signs and the nursing notes.   HISTORY  Chief Complaint Shortness of Breath and Weakness    HPI Brittney [name] is a 79 y.o. female with below list of previous medical conditions including diabetes mellitus hyperlipidemia and chronic congestive heart failure.  Presents to the emergency department via EMS secondary to acute onset of dyspnea and generalized weakness that the patient states began this morning.  Patient also admits to bilateral lower extremity swelling which she noted yesterday.  Patient denies any chest pain no headache no nausea or vomiting.        Past Medical History:  Diagnosis Date  . Aortic valve stenosis   . Carotid bruit   . CHF (congestive heart failure) ([place])   . Chronic kidney disease   . Diabetes mellitus without complication ([place])   . GERD (gastroesophageal reflux disease)   . Heart murmur   . Hyperlipidemia   . Hypertension   . Pneumonia   . Vitamin D deficiency     Patient Active Problem List   Diagnosis Date Noted  . Reflux esophagitis   . CLE (columnar lined esophagus)   . Duodenitis 06/25/2019  . Chronic heart failure with preserved ejection fraction (HFpEF) ([place]) 02/26/2019  . Benign essential HTN 01/17/2019  . DM (diabetes mellitus) ([place]) 01/17/2019  . Diabetes mellitus type 2, uncontrolled ([place]) 07/24/2015  . GERD without esophagitis 07/24/2015  . Hyperlipidemia 07/24/2015    Past Surgical History:  Procedure Laterality Date  . COLONOSCOPY    . COLONOSCOPY WITH PROPOFOL N/A 08/02/2019   Procedure: COLONOSCOPY WITH PROPOFOL;  Surgeon: Virgel Manifold, MD;  Location: [facility] ENDOSCOPY;  Service: Endoscopy;  Laterality: N/A;  . CORONARY ANGIOPLASTY    . ESOPHAGOGASTRODUODENOSCOPY N/A 06/26/2019   Procedure: ESOPHAGOGASTRODUODENOSCOPY (EGD);  Surgeon: Virgel Manifold, MD;  Location: [facility] ENDOSCOPY;  Service: Endoscopy;  Laterality: N/A;  . ESOPHAGOGASTRODUODENOSCOPY (EGD) WITH PROPOFOL N/A 08/02/2019   Procedure: ESOPHAGOGASTRODUODENOSCOPY (EGD) WITH PROPOFOL;  Surgeon: Virgel Manifold, MD;  Location: [facility] ENDOSCOPY;  Service: Endoscopy;  Laterality: N/A;  . LEFT HEART CATH AND CORONARY ANGIOGRAPHY N/A 01/23/2019   Procedure: LEFT HEART CATH AND CORONARY ANGIOGRAPHY;  Surgeon: Yolonda Kida, MD;  Location: [place] CV LAB;  Service: Cardiovascular;  Laterality: N/A;  . ovarian cyst removal      Prior to Admission medications   Medication Sig Start Date End Date Taking? Authorizing Provider  acetaminophen (TYLENOL) 325 MG tablet Take 2 tablets (650 mg total) by mouth every 6 (six) hours as needed for mild pain (or Fever >/= 101). 06/26/19   Gouru, Illene Silver, MD  ALBUTEROL IN Inhale 90 mcg into the lungs every 6 (six) hours as needed (wheezing).    [provider]  amLODipine (NORVASC) 10 MG tablet Take 10 mg by mouth daily.    [provider]  aspirin EC 81 MG EC tablet Take 1 tablet (81 mg total) by mouth daily. 06/27/19   Gouru, Illene Silver, MD  atorvastatin (LIPITOR) 40 MG tablet Take 1 tablet by mouth daily. 07/04/18   [provider]  cetirizine (ZYRTEC) 10 MG tablet Take 10 mg by mouth daily.     [provider]  Choline Fenofibrate 45 MG capsule Take 90 mg by mouth daily.    [provider]  furosemide (LASIX) 40 MG tablet Take  1 tablet (40 mg total) by mouth daily. 06/26/19   Gouru, Illene Silver, MD  glipiZIDE (GLUCOTROL XL) 10 MG 24 hr tablet Take 10 mg by mouth 2 (two) times daily.    [provider]  hydrALAZINE (APRESOLINE) 25 MG tablet Take 25 mg by mouth 3 (three) times daily. Taking twice a day    [provider]  isosorbide mononitrate (IMDUR) 30 MG 24 hr tablet Take 30 mg by mouth daily.    [provider]    Liraglutide (VICTOZA) 18 MG/3ML SOPN Inject 1.8 mg into the skin every morning.    [provider]  losartan (COZAAR) 100 MG tablet Take 100 mg by mouth daily.    [provider]  metFORMIN (GLUCOPHAGE) 1000 MG tablet Take 1,000 mg by mouth 2 (two) times daily with a meal. Just started back. Not sure how many mg    [provider]  metoprolol tartrate (LOPRESSOR) 25 MG tablet Take 1 tablet (25 mg total) by mouth 2 (two) times daily. 06/26/19   Gouru, Illene Silver, MD  pantoprazole (PROTONIX) 40 MG tablet Take 1 tablet (40 mg total) by mouth 2 (two) times daily. 06/26/19   Gouru, Illene Silver, MD  sitaGLIPtin (JANUVIA) 100 MG tablet Take 100 mg by mouth daily.    [provider]    Allergies Accupril [quinapril hcl]  Family History  Problem Relation Age of Onset  . Diabetes Neg Hx   . Hypertension Neg Hx   . Breast cancer Neg Hx     Social History Social History   Tobacco Use  . Smoking status: Former Smoker    Packs/day: 1.00    Years: 40.00    Pack years: 40.00    Types: Cigarettes    Quit date: 07/27/2018    Years since quitting: 1.5  . Smokeless tobacco: Never Used  Substance Use Topics  . Alcohol use: No  . Drug use: No    Review of Systems Constitutional: No fever/chills Eyes: No visual changes. ENT: No sore throat. Cardiovascular: Denies chest pain. Respiratory: Positive for dyspnea Gastrointestinal: No abdominal pain.  No nausea, no vomiting.  No diarrhea.  No constipation. Genitourinary: Negative for dysuria. Musculoskeletal: Negative for neck pain.  Negative for back pain. Integumentary: Negative for rash. Neurological: Negative for headaches, focal weakness or numbness.  ____________________________________________   PHYSICAL EXAM:  VITAL SIGNS: ED Triage Vitals  Enc Vitals Group     BP 02/01/20 0334 (!) 181/77     Pulse Rate 02/01/20 0334 (!) 106     Resp 02/01/20 0334 (!) 27     Temp 02/01/20 0334 98.7 F (37.1 C)     Temp  Source 02/01/20 0334 Oral     SpO2 02/01/20 0325 99 %     Weight 02/01/20 0328 78.5 kg (173 lb)     Height 02/01/20 0328 1.702 m (5\' 7" )     Head Circumference --      Peak Flow --      Pain Score 02/01/20 0328 0     Pain Loc --      Pain Edu? --      Excl. in [place]? --     Constitutional: Alert and oriented.  Eyes: Conjunctivae are normal.  Mouth/Throat: Patient is wearing a mask. Neck: No stridor.  No meningeal signs.   Cardiovascular: Normal rate, regular rhythm. Good peripheral circulation. Grossly normal heart sounds. Respiratory: Normal respiratory effort.  No retractions. Gastrointestinal: Soft and nontender. No distention.   Musculoskeletal: No lower extremity  tenderness 1+ bilateral lower extremity pitting edema Neurologic:  Normal speech and language. No gross focal neurologic deficits are appreciated.  Skin:  Skin is warm, dry and intact. Psychiatric: Mood and affect are normal. Speech and behavior are normal.  ____________________________________________   LABS (all labs ordered are listed, but only abnormal results are displayed)  Labs Reviewed  CBC - Abnormal; Notable for the following components:      Result Value   WBC 13.5 (*)    RBC 3.80 (*)    Hemoglobin 9.9 (*)    HCT 30.9 (*)    All other components within normal limits  DIFFERENTIAL - Abnormal; Notable for the following components:   Neutro Abs 10.0 (*)    Abs Immature Granulocytes 0.11 (*)    All other components within normal limits  RESPIRATORY PANEL BY RT PCR (FLU A&B, COVID)  CBC WITH DIFFERENTIAL/PLATELET  BRAIN NATRIURETIC PEPTIDE  COMPREHENSIVE METABOLIC PANEL  POC SARS CORONAVIRUS 2 AG -  ED  POC SARS CORONAVIRUS 2 AG  TROPONIN I (HIGH SENSITIVITY)  TROPONIN I (HIGH SENSITIVITY)   ____________________________________________  EKG  ED ECG REPORT I, [place] N Kura Bethards, the attending physician, personally viewed and interpreted this ECG.   Date: 02/01/2020  EKG Time: 3:33 AM  Rate:  105  Rhythm: Sinus tachycardia  Axis: Normal  Intervals: Normal  ST&T Change: None  ____________________________________________  RADIOLOGY I,  N Jo Booze, personally viewed and evaluated these images (plain radiographs) as part of my medical decision making, as well as reviewing the written report by the radiologist.  ED MD interpretation: Early CHF noted on chest x-ray per radiologist.  Official radiology report(s): DG Chest Port 1 View  Result Date: 02/01/2020 CLINICAL DATA:  Dyspnea EXAM: PORTABLE CHEST 1 VIEW COMPARISON:  06/24/2019 FINDINGS: Cardiac shadow is stable. Aortic calcifications are again seen. Mild vascular congestion is noted with very mild interstitial edema. No sizable effusion is noted. No focal infiltrate is seen. No bony abnormality is noted. IMPRESSION: Early CHF. Electronically Signed   By: Inez Catalina M.D.   On: 02/01/2020 03:57      Procedures   ____________________________________________   INITIAL IMPRESSION / MDM / ASSESSMENT AND PLAN / ED COURSE  As part of my medical decision making, I reviewed the following data within the [place] NUMBER   79 year old female presented with above-stated history and physical exam secondary to dyspnea with differential diagnosis including but not limited to CHF exacerbation pneumonia bronchitis ACS.  2 L nasal cannula oxygen applied patient given 40 mg of Lasix.  On reevaluation patient admits to continued dyspnea however stating that it is improved.  Laboratory data notable for troponin of 38 and BNP of 190 and creatinine 1.48 BUN of 24.  Patient discussed with Dr. Damita Dunnings for hospital admission for further evaluation and management  ____________________________________________  FINAL CLINICAL IMPRESSION(S) / ED DIAGNOSES  Final diagnoses:  Acute on chronic congestive heart failure, unspecified heart failure type [facility])     MEDICATIONS GIVEN DURING THIS VISIT:  Medications  furosemide (LASIX)  injection 40 mg (40 mg Intravenous Given 02/01/20 0412)  ipratropium-albuterol (DUONEB) 0.5-2.5 (3) MG/3ML nebulizer solution 3 mL (3 mLs Nebulization Given 02/01/20 [id number])     ED Discharge Orders    None      *Please note:  Brittney [name] was evaluated in Emergency Department on 02/01/2020 for the symptoms described in the history of present illness. She was evaluated in the context of the global COVID-19 pandemic, which necessitated consideration  that the patient might be at risk for infection with the SARS-CoV-2 virus that causes COVID-19. Institutional protocols and algorithms that pertain to the evaluation of patients at risk for COVID-19 are in a state of rapid change based on information released by regulatory bodies including the CDC and federal and state organizations. These policies and algorithms were followed during the patient's care in the ED.  Some ED evaluations and interventions may be delayed as a result of limited staffing during the pandemic.*  Note:  This document was prepared using Dragon voice recognition software and may include unintentional dictation errors.   Gregor Hams, MD 02/01/20 [phone]

## 2020-02-01 NOTE — ED Triage Notes (Signed)
Patient arrives from home with increased shortness of breath and weakness. Woke up this morning feeling worse. Hx of COPD

## 2020-02-01 NOTE — Progress Notes (Signed)
   02/01/20 1300  Clinical Encounter Type  Visited With Patient  Visit Type Initial  Referral From Nurse  Consult/Referral To Chaplain  Spiritual Encounters  Spiritual Needs Prayer  Stress Factors  Patient Stress Factors Health changes;Loss   This is an order req: Chaplain visited with patient who stated some concerns of health issues and loss of a family member. Patient asked for prayer. Chaplain offered prayer. Patient was in good spirits and was smiling.

## 2020-02-01 NOTE — Consult Note (Signed)
Cardiology Consultation Note    Patient ID: Brittney [name], MRN: [id number], DOB/AGE: 80/05/1941 80 y.o. Admit date: 02/01/2020   Date of Consult: 02/01/2020 Primary Physician: Elisabeth Cara, NP Primary Cardiologist: Dr. Clayborn Bigness  Chief Complaint: sob Reason for Consultation: [name] Requesting MD: Dr. Derryl Harbor  HPI: Brittney [name] is a 79 y.o. female with history of diabetes mellitus, hyperlipidemia, hypertension as well as COPD presented to the emergency room with complaints of acute shortness of breath.  She was noted to have sob and bilateral lower extremety edema.    Chest x-ray suggested early pulmonary edema. Mild troponin elevation.  EKG showed sinus tachycardia with no ischemia. Echo 01/02/2019 revealed mildly reduced LV function with ef of 45-50%. No significant valvular abnormalities. Cardiac cath 12/2018 revealed ef 65% with moderate to severe disease in the proximial and distal lad with mid to distal rca stenosis felt to be a candidate for medical therapy due to distal disease. Also noted to have acute on chronic renal insuffiency with creat up to 1.48 from baseline of 0.99.  BNP mildly elevated and improved from previous values. Mild anemia with hgb of 9.9. She is COVID-19 negative by Ag. She was given iv lasix and oxygen.  Patient feels a lot better since coming to the ER.  She states her symptoms worsened overnight.  She did have some peripheral edema which gradually worsened over the previous several days.  Past Medical History:  Diagnosis Date  . Aortic valve stenosis   . Carotid bruit   . CHF (congestive heart failure) ([place])   . Chronic kidney disease   . Diabetes mellitus without complication ([place])   . GERD (gastroesophageal reflux disease)   . Heart murmur   . Hyperlipidemia   . Hypertension   . Pneumonia   . Vitamin D deficiency       Surgical History:  Past Surgical History:  Procedure Laterality Date  . COLONOSCOPY    . COLONOSCOPY WITH PROPOFOL N/A 08/02/2019    Procedure: COLONOSCOPY WITH PROPOFOL;  Surgeon: Virgel Manifold, MD;  Location: [facility] ENDOSCOPY;  Service: Endoscopy;  Laterality: N/A;  . CORONARY ANGIOPLASTY    . ESOPHAGOGASTRODUODENOSCOPY N/A 06/26/2019   Procedure: ESOPHAGOGASTRODUODENOSCOPY (EGD);  Surgeon: Virgel Manifold, MD;  Location: [facility] ENDOSCOPY;  Service: Endoscopy;  Laterality: N/A;  . ESOPHAGOGASTRODUODENOSCOPY (EGD) WITH PROPOFOL N/A 08/02/2019   Procedure: ESOPHAGOGASTRODUODENOSCOPY (EGD) WITH PROPOFOL;  Surgeon: Virgel Manifold, MD;  Location: [facility] ENDOSCOPY;  Service: Endoscopy;  Laterality: N/A;  . LEFT HEART CATH AND CORONARY ANGIOGRAPHY N/A 01/23/2019   Procedure: LEFT HEART CATH AND CORONARY ANGIOGRAPHY;  Surgeon: Yolonda Kida, MD;  Location: [place] CV LAB;  Service: Cardiovascular;  Laterality: N/A;  . ovarian cyst removal       Home Meds: Prior to Admission medications   Medication Sig Start Date End Date Taking? Authorizing Provider  acetaminophen (TYLENOL) 325 MG tablet Take 2 tablets (650 mg total) by mouth every 6 (six) hours as needed for mild pain (or Fever >/= 101). 06/26/19  Yes Gouru, Illene Silver, MD  ALBUTEROL IN Inhale 90 mcg into the lungs every 6 (six) hours as needed (wheezing).   Yes [provider]  amLODipine (NORVASC) 10 MG tablet Take 10 mg by mouth daily.   Yes [provider]  aspirin EC 81 MG EC tablet Take 1 tablet (81 mg total) by mouth daily. 06/27/19  Yes Gouru, Illene Silver, MD  atorvastatin (LIPITOR) 40 MG tablet Take 1 tablet by mouth daily. 07/04/18  Yes [provider]  cetirizine (ZYRTEC) 10 MG tablet Take 10 mg by mouth daily.    Yes [provider]  Choline Fenofibrate 45 MG capsule Take 90 mg by mouth daily.   Yes [provider]  furosemide (LASIX) 40 MG tablet Take 1 tablet (40 mg total) by mouth daily. 06/26/19  Yes Gouru, Aruna, MD  glipiZIDE (GLUCOTROL XL) 10 MG 24 hr tablet Take 10 mg by mouth 2 (two) times daily.   Yes [provider]  hydrALAZINE (APRESOLINE) 25 MG tablet Take 25 mg by mouth 3 (three) times daily.    Yes [provider]  isosorbide mononitrate (IMDUR) 30 MG 24 hr tablet Take 30 mg by mouth daily.   Yes [provider]  LANTUS SOLOSTAR 100 UNIT/ML Solostar Pen Inject 16 Units into the skin at bedtime. 10/19/19  Yes [provider]  losartan (COZAAR) 100 MG tablet Take 100 mg by mouth daily.   Yes [provider]  metFORMIN (GLUCOPHAGE) 1000 MG tablet Take 1,000 mg by mouth 2 (two) times daily with a meal. Just started back. Not sure how many mg   Yes [provider]  metoprolol tartrate (LOPRESSOR) 25 MG tablet Take 1 tablet (25 mg total) by mouth 2 (two) times daily. Patient taking differently: Take 50 mg by mouth 2 (two) times daily.  06/26/19  Yes Gouru, Illene Silver, MD  Multiple Vitamin (MULTIVITAMIN WITH MINERALS) TABS tablet Take 1 tablet by mouth daily.   Yes [provider]  pantoprazole (PROTONIX) 40 MG tablet Take 1 tablet (40 mg total) by mouth 2 (two) times daily. 06/26/19  Yes Gouru, Aruna, MD  sitaGLIPtin (JANUVIA) 100 MG tablet Take 100 mg by mouth daily.   Yes [provider]    Inpatient Medications:     Allergies:  Allergies  Allergen Reactions  . Accupril [Quinapril Hcl]     Social History   Socioeconomic History  . Marital status: Widowed    Spouse name: Not on file  . Number of children: Not on file  . Years of education: Not on file  . Highest education level: Not on file  Occupational History  . Not on file  Tobacco Use  . Smoking status: Former Smoker    Packs/day: 1.00    Years: 40.00    Pack years: 40.00    Types: Cigarettes    Quit date: 07/27/2018    Years since quitting: 1.5  . Smokeless tobacco: Never Used  Substance and Sexual Activity  . Alcohol use: No  . Drug use: No  . Sexual activity: Not on file  Other Topics Concern  . Not on file  Social History Narrative  . Not on file    Social Determinants of Health   Financial Resource Strain:   . Difficulty of Paying Living Expenses: Not on file  Food Insecurity:   . Worried About Charity fundraiser in the Last Year: Not on file  . Ran Out of Food in the Last Year: Not on file  Transportation Needs:   . Lack of Transportation (Medical): Not on file  . Lack of Transportation (Non-Medical): Not on file  Physical Activity:   . Days of Exercise per Week: Not on file  . Minutes of Exercise per Session: Not on file  Stress:   . Feeling of Stress : Not on file  Social Connections:   . Frequency of Communication with Friends and Family: Not on file  . Frequency of Social Gatherings  with Friends and Family: Not on file  . Attends Religious Services: Not on file  . Active Member of Clubs or Organizations: Not on file  . Attends Archivist Meetings: Not on file  . Marital Status: Not on file  Intimate Partner Violence:   . Fear of Current or Ex-Partner: Not on file  . Emotionally Abused: Not on file  . Physically Abused: Not on file  . Sexually Abused: Not on file     Family History  Problem Relation Age of Onset  . Diabetes Neg Hx   . Hypertension Neg Hx   . Breast cancer Neg Hx      Review of Systems: A 12-system review of systems was performed and is negative except as noted in the HPI.  Labs: No results for input(s): CKTOTAL, CKMB, TROPONINI in the last 72 hours. Lab Results  Component Value Date   WBC 13.5 (H) 02/01/2020   HGB 9.9 (L) 02/01/2020   HCT 30.9 (L) 02/01/2020   MCV 81.3 02/01/2020   PLT 362 02/01/2020    Recent Labs  Lab 02/01/20 0515  NA 139  K 3.7  CL 105  CO2 19*  BUN 29*  CREATININE 1.48*  CALCIUM 8.8*  PROT 6.9  BILITOT 0.7  ALKPHOS 46  ALT 13  AST 19  GLUCOSE 307*   No results found for: CHOL, HDL, LDLCALC, TRIG No results found for: DDIMER  Radiology/Studies:  DG Chest Port 1 View  Result Date: 02/01/2020 CLINICAL DATA:  Dyspnea EXAM: PORTABLE CHEST  1 VIEW COMPARISON:  06/24/2019 FINDINGS: Cardiac shadow is stable. Aortic calcifications are again seen. Mild vascular congestion is noted with very mild interstitial edema. No sizable effusion is noted. No focal infiltrate is seen. No bony abnormality is noted. IMPRESSION: Early CHF. Electronically Signed   By: Inez Catalina M.D.   On: 02/01/2020 03:57    Wt Readings from Last 3 Encounters:  02/01/20 78.5 kg  08/02/19 77.1 kg  07/12/19 76.1 kg    EKG: Sinus tachycardia with no ischemia  Physical Exam: No acute distress Blood pressure (!) 156/65, pulse 87, temperature 98.7 F (37.1 C), temperature source Oral, resp. rate (!) 22, height 5\' 7"  (1.702 m), weight 78.5 kg, SpO2 96 %. Body mass index is 27.1 kg/m. General: Well developed, well nourished, in no acute distress. Head: Normocephalic, atraumatic, sclera non-icteric, no xanthomas, nares are without discharge.  Neck: Negative for carotid bruits. JVD not elevated. Lungs: Clear bilaterally to auscultation without wheezes, rales, or rhonchi. Breathing is unlabored. Heart: RRR with S1 S2. No murmurs, rubs, or gallops appreciated. Abdomen: Soft, non-tender, non-distended with normoactive bowel sounds. No hepatomegaly. No rebound/guarding. No obvious abdominal masses. Msk:  Strength and tone appear normal for age. Extremities: No clubbing or cyanosis.  2+ bilateral lower extremity edema.  Distal pedal pulses are 2+ and equal bilaterally. Neuro: Alert and oriented X 3. No facial asymmetry. No focal deficit. Moves all extremities spontaneously. Psych:  Responds to questions appropriately with a normal affect.     Assessment and Plan  79 year old female with history of diabetes, hyperlipidemia, hypertension as well as history of HFpEF with most recent echo done in 2020 in January showing an EF 45 to 50% with a cath in January 2020 felt to show a normal ejection fraction.  She had moderate to severe distal disease felt to be a candidate for  medical management.  She now presents with increasing shortness of breath and evidence of early congestive heart failure on  chest x-ray.  She denies chest pain.  Serum troponin is mildly elevated without EKG changes and chest pain is suggestive of demand ischemia.  She is hemodynamically stable feeling well states she has been compliant with her medications.  She has a mild acute on chronic renal insufficiency with a creatinine up to 1.48 from a baseline of normal.  Her BNP is only mildly elevated and less elevated than on previous admissions.  Would continue with careful diuresis.  Not a candidate for IV heparin.  Does not appear to require any further invasive evaluation given cath approximate 1 year ago showing distal disease amenable to medical management.  We will continue with current regimen.  Will review home meds and adjust as needed.  Would likely be able to be discharged in the morning if she continues to improve and diuresis well.  Will evaluate with you in the morning and discussed discharge plans.  Patient would follow with Dr. Clayborn Bigness as an outpatient.  Ivin Booty MD 02/01/2020, 10:14 AM Pager: [phone]

## 2020-02-01 NOTE — ED Notes (Signed)
Patient provided with warm blanket and TV remote. No further requests at this time

## 2020-02-01 NOTE — Consult Note (Signed)
[facility]  CONSULT NOTE    Date: 02/01/2020                  Patient Name:  Brittney [name]  MRN: [id number]  DOB: 11/23/1941  Age / Sex: 79 y.o., female         PCP: Elisabeth Cara, NP                 Service Requesting Consult: Dr. Derryl Harbor                 Reason for Consult: Acute renal failure            History of Present Illness: Brittney [name] presents with acute exacerbation of congestive heart failure. Patient had been noticing her feet were starting to swell earlier this week. She started having shortness of breath and orthopnea. She felt that this was stomach issue and took antacids with no relief. Patient was drinking more fluids to help her feel better.  She presents to the ED with more weakness.    Medications: Outpatient medications: Medications Prior to Admission  Medication Sig Dispense Refill Last Dose  . acetaminophen (TYLENOL) 325 MG tablet Take 2 tablets (650 mg total) by mouth every 6 (six) hours as needed for mild pain (or Fever >/= 101).   prn at prn  . ALBUTEROL IN Inhale 90 mcg into the lungs every 6 (six) hours as needed (wheezing).   prn at prn  . amLODipine (NORVASC) 10 MG tablet Take 10 mg by mouth daily.   01/31/2020 at Unknown time  . aspirin EC 81 MG EC tablet Take 1 tablet (81 mg total) by mouth daily. 90 tablet 0 01/31/2020 at Unknown time  . atorvastatin (LIPITOR) 40 MG tablet Take 1 tablet by mouth daily.   01/31/2020 at Unknown time  . cetirizine (ZYRTEC) 10 MG tablet Take 10 mg by mouth daily.    01/31/2020 at Unknown time  . Choline Fenofibrate 45 MG capsule Take 90 mg by mouth daily.   01/31/2020 at Unknown time  . furosemide (LASIX) 40 MG tablet Take 1 tablet (40 mg total) by mouth daily. 30 tablet 1 01/31/2020 at Unknown time  . glipiZIDE (GLUCOTROL XL) 10 MG 24 hr tablet Take 10 mg by mouth 2 (two) times daily.   01/31/2020 at Unknown time  . hydrALAZINE (APRESOLINE) 25 MG tablet Take 25 mg by mouth 3 (three) times daily.     01/31/2020 at Unknown time  . isosorbide mononitrate (IMDUR) 30 MG 24 hr tablet Take 30 mg by mouth daily.   01/31/2020 at Unknown time  . LANTUS SOLOSTAR 100 UNIT/ML Solostar Pen Inject 16 Units into the skin at bedtime.   01/31/2020 at Unknown time  . losartan (COZAAR) 100 MG tablet Take 100 mg by mouth daily.   01/31/2020 at Unknown time  . metFORMIN (GLUCOPHAGE) 1000 MG tablet Take 1,000 mg by mouth 2 (two) times daily with a meal. Just started back. Not sure how many mg   01/31/2020 at Unknown time  . metoprolol tartrate (LOPRESSOR) 25 MG tablet Take 1 tablet (25 mg total) by mouth 2 (two) times daily. (Patient taking differently: Take 50 mg by mouth 2 (two) times daily. ) 60 tablet 0 01/31/2020 at Unknown time  . Multiple Vitamin (MULTIVITAMIN WITH MINERALS) TABS tablet Take 1 tablet by mouth daily.   01/31/2020 at Unknown time  . pantoprazole (PROTONIX) 40 MG tablet Take 1 tablet (40 mg total) by mouth  2 (two) times daily. 60 tablet 1 01/31/2020 at Unknown time  . saccharomyces boulardii (FLORASTOR) 250 MG capsule Take 250 mg by mouth 2 (two) times daily.   01/31/2020 at Unknown time  . sitaGLIPtin (JANUVIA) 100 MG tablet Take 100 mg by mouth daily.   01/31/2020 at Unknown time    Current medications: Current Facility-Administered Medications  Medication Dose Route Frequency Provider Last Rate Last Admin  . 0.9 %  sodium chloride infusion  250 mL Intravenous PRN Lenna Sciara, MD      . acetaminophen (TYLENOL) tablet 650 mg  650 mg Oral Q6H PRN Lenna Sciara, MD      . amLODipine (NORVASC) tablet 10 mg  10 mg Oral Daily Lenna Sciara, MD   10 mg at 02/01/20 1433  . aspirin EC tablet 81 mg  81 mg Oral Daily Lenna Sciara, MD   81 mg at 02/01/20 1433  . atorvastatin (LIPITOR) tablet 40 mg  40 mg Oral q1800 Teodoro Spray, MD      . fenofibrate tablet 54 mg  54 mg Oral Daily Lenna Sciara, MD   54 mg at 02/01/20 1603  . furosemide (LASIX) injection 40 mg  40 mg Intravenous BID Lenna Sciara, MD      .  heparin injection 5,000 Units  5,000 Units Subcutaneous Q8H Lenna Sciara, MD   5,000 Units at 02/01/20 1432  . hydrALAZINE (APRESOLINE) tablet 25 mg  25 mg Oral TID Lenna Sciara, MD   25 mg at 02/01/20 1603  . insulin aspart (novoLOG) injection 0-9 Units  0-9 Units Subcutaneous TID WC Lenna Sciara, MD      . insulin glargine (LANTUS) injection 16 Units  16 Units Subcutaneous QHS Lenna Sciara, MD      . isosorbide mononitrate (IMDUR) 24 hr tablet 30 mg  30 mg Oral Daily Lenna Sciara, MD   30 mg at 02/01/20 1433  . loratadine (CLARITIN) tablet 10 mg  10 mg Oral Daily Lenna Sciara, MD   10 mg at 02/01/20 1433  . metoprolol tartrate (LOPRESSOR) tablet 50 mg  50 mg Oral BID Lenna Sciara, MD   50 mg at 02/01/20 1433  . multivitamin with minerals tablet 1 tablet  1 tablet Oral Daily Lenna Sciara, MD   1 tablet at 02/01/20 1432  . ondansetron (ZOFRAN) injection 4 mg  4 mg Intravenous Q6H PRN Lenna Sciara, MD      . pantoprazole (PROTONIX) EC tablet 40 mg  40 mg Oral BID Lenna Sciara, MD   40 mg at 02/01/20 1433  . potassium chloride SA (KLOR-CON) CR tablet 10 mEq  10 mEq Oral Daily Lenna Sciara, MD   10 mEq at 02/01/20 1433  . sodium chloride flush (NS) 0.9 % injection 3 mL  3 mL Intravenous Q12H Lenna Sciara, MD   3 mL at 02/01/20 1434  . sodium chloride flush (NS) 0.9 % injection 3 mL  3 mL Intravenous PRN Lenna Sciara, MD          Allergies: Allergies  Allergen Reactions  . Accupril [Quinapril Hcl]       Past Medical History: Past Medical History:  Diagnosis Date  . Aortic valve stenosis   . Carotid bruit   . CHF (congestive heart failure) ([place])   . Chronic kidney disease   . Diabetes mellitus without complication ([place])   . GERD (gastroesophageal reflux disease)   . Heart murmur   . Hyperlipidemia   . Hypertension   . Pneumonia   . Vitamin  D deficiency      Past Surgical History: Past Surgical History:  Procedure Laterality Date  . COLONOSCOPY    . COLONOSCOPY WITH  PROPOFOL N/A 08/02/2019   Procedure: COLONOSCOPY WITH PROPOFOL;  Surgeon: Virgel Manifold, MD;  Location: [facility] ENDOSCOPY;  Service: Endoscopy;  Laterality: N/A;  . CORONARY ANGIOPLASTY    . ESOPHAGOGASTRODUODENOSCOPY N/A 06/26/2019   Procedure: ESOPHAGOGASTRODUODENOSCOPY (EGD);  Surgeon: Virgel Manifold, MD;  Location: [facility] ENDOSCOPY;  Service: Endoscopy;  Laterality: N/A;  . ESOPHAGOGASTRODUODENOSCOPY (EGD) WITH PROPOFOL N/A 08/02/2019   Procedure: ESOPHAGOGASTRODUODENOSCOPY (EGD) WITH PROPOFOL;  Surgeon: Virgel Manifold, MD;  Location: [facility] ENDOSCOPY;  Service: Endoscopy;  Laterality: N/A;  . LEFT HEART CATH AND CORONARY ANGIOGRAPHY N/A 01/23/2019   Procedure: LEFT HEART CATH AND CORONARY ANGIOGRAPHY;  Surgeon: Yolonda Kida, MD;  Location: [place] CV LAB;  Service: Cardiovascular;  Laterality: N/A;  . ovarian cyst removal       Family History: Family History  Problem Relation Age of Onset  . Diabetes Neg Hx   . Hypertension Neg Hx   . Breast cancer Neg Hx      Social History: Social History   Socioeconomic History  . Marital status: Widowed    Spouse name: Not on file  . Number of children: Not on file  . Years of education: Not on file  . Highest education level: Not on file  Occupational History  . Not on file  Tobacco Use  . Smoking status: Former Smoker    Packs/day: 1.00    Years: 40.00    Pack years: 40.00    Types: Cigarettes    Quit date: 07/27/2018    Years since quitting: 1.5  . Smokeless tobacco: Never Used  Substance and Sexual Activity  . Alcohol use: No  . Drug use: No  . Sexual activity: Not on file  Other Topics Concern  . Not on file  Social History Narrative  . Not on file   Social Determinants of Health   Financial Resource Strain:   . Difficulty of Paying Living Expenses: Not on file  Food Insecurity:   . Worried About Charity fundraiser in the Last Year: Not on file  . Ran Out of Food in the Last Year: Not on file   Transportation Needs:   . Lack of Transportation (Medical): Not on file  . Lack of Transportation (Non-Medical): Not on file  Physical Activity:   . Days of Exercise per Week: Not on file  . Minutes of Exercise per Session: Not on file  Stress:   . Feeling of Stress : Not on file  Social Connections:   . Frequency of Communication with Friends and Family: Not on file  . Frequency of Social Gatherings with Friends and Family: Not on file  . Attends Religious Services: Not on file  . Active Member of Clubs or Organizations: Not on file  . Attends Archivist Meetings: Not on file  . Marital Status: Not on file  Intimate Partner Violence:   . Fear of Current or Ex-Partner: Not on file  . Emotionally Abused: Not on file  . Physically Abused: Not on file  . Sexually Abused: Not on file     Review of Systems: Review of Systems  Constitutional: Positive for malaise/fatigue. Negative for chills, diaphoresis, fever and weight loss.  HENT: Negative.  Negative for congestion, ear discharge, ear pain, hearing loss, nosebleeds, sinus pain, sore throat and tinnitus.   Eyes: Negative.  Negative for blurred vision, double vision, photophobia, pain, discharge and redness.  Respiratory: Positive for cough and shortness of breath. Negative for hemoptysis, sputum production, wheezing and stridor.   Cardiovascular: Positive for orthopnea and leg swelling. Negative for palpitations, claudication and PND.  Gastrointestinal: Positive for abdominal pain, diarrhea, heartburn, nausea and vomiting. Negative for blood in stool, constipation and melena.  Genitourinary: Negative.  Negative for dysuria, flank pain, frequency, hematuria and urgency.  Musculoskeletal: Negative.  Negative for back pain, falls, joint pain, myalgias and neck pain.  Skin: Negative.  Negative for itching and rash.  Neurological: Positive for weakness. Negative for dizziness, tingling, tremors, sensory change, speech change,  focal weakness, seizures, loss of consciousness and headaches.  Endo/Heme/Allergies: Negative.  Negative for environmental allergies and polydipsia. Does not bruise/bleed easily.  Psychiatric/Behavioral: Negative.  Negative for depression, hallucinations, memory loss, substance abuse and suicidal ideas. The patient is not nervous/anxious and does not have insomnia.     Vital Signs: Blood pressure (!) 164/59, pulse 69, temperature 98.7 F (37.1 C), temperature source Oral, resp. rate (!) 24, height 5\' 7"  (1.702 m), weight 78.3 kg, SpO2 96 %.  Weight trends: Filed Weights   02/01/20 0328 02/01/20 1300  Weight: 78.5 kg 78.3 kg    Physical Exam: General: NAD, laying in bed with head up  Head: Normocephalic, atraumatic. Moist oral mucosal membranes  Eyes: Anicteric, PERRL  Neck: Supple, trachea midline  Lungs:  Bilateral crackles  Heart: Regular rate and rhythm, +murmur  Abdomen:  Soft, nontender,   Extremities:  + peripheral edema.  Neurologic: Nonfocal, moving all four extremities  Skin: No lesions         Lab results: Basic Metabolic Panel: Recent Labs  Lab 02/01/20 0515  NA 139  K 3.7  CL 105  CO2 19*  GLUCOSE 307*  BUN 29*  CREATININE 1.48*  CALCIUM 8.8*    Liver Function Tests: Recent Labs  Lab 02/01/20 0515  AST 19  ALT 13  ALKPHOS 46  BILITOT 0.7  PROT 6.9  ALBUMIN 3.6   No results for input(s): LIPASE, AMYLASE in the last 168 hours. No results for input(s): AMMONIA in the last 168 hours.  CBC: Recent Labs  Lab 02/01/20 0445  WBC 13.5*  NEUTROABS 10.0*  HGB 9.9*  HCT 30.9*  MCV 81.3  PLT 362    Cardiac Enzymes: No results for input(s): CKTOTAL, CKMB, CKMBINDEX, TROPONINI in the last 168 hours.  BNP: Invalid input(s): POCBNP  CBG: Recent Labs  Lab 02/01/20 [id number]    Microbiology: Results for orders placed or performed during the hospital encounter of 02/01/20  Respiratory Panel by RT PCR (Flu A&B, Covid) -  Nasopharyngeal Swab     Status: None   Collection Time: 02/01/20  4:14 AM   Specimen: Nasopharyngeal Swab  Result Value Ref Range Status   SARS Coronavirus 2 by RT PCR NEGATIVE NEGATIVE Final    Comment: (NOTE) SARS-CoV-2 target nucleic acids are NOT DETECTED. The SARS-CoV-2 RNA is generally detectable in upper respiratoy specimens during the acute phase of infection. The lowest concentration of SARS-CoV-2 viral copies this assay can detect is 131 copies/mL. A negative result does not preclude SARS-Cov-2 infection and should not be used as the sole basis for treatment or other patient management decisions. A negative result may occur with  improper specimen collection/handling, submission of specimen other than nasopharyngeal swab, presence of viral mutation(s) within the areas targeted by this assay, and inadequate number of viral copies (<131  copies/mL). A negative result must be combined with clinical observations, patient history, and epidemiological information. The expected result is Negative. Fact Sheet for Patients:  PinkCheek.be Fact Sheet for Healthcare Providers:  GravelBags.it This test is not yet ap proved or cleared by the [place] FDA and  has been authorized for detection and/or diagnosis of SARS-CoV-2 by FDA under an Emergency Use Authorization (EUA). This EUA will remain  in effect (meaning this test can be used) for the duration of the COVID-19 declaration under Section 564(b)(1) of the Act, 21 U.S.C. section 360bbb-3(b)(1), unless the authorization is terminated or revoked sooner.    Influenza A by PCR NEGATIVE NEGATIVE Final   Influenza B by PCR NEGATIVE NEGATIVE Final    Comment: (NOTE) The Xpert Xpress SARS-CoV-2/FLU/RSV assay is intended as an aid in  the diagnosis of influenza from Nasopharyngeal swab specimens and  should not be used as a sole basis for treatment. Nasal washings and  aspirates  are unacceptable for Xpert Xpress SARS-CoV-2/FLU/RSV  testing. Fact Sheet for Patients: PinkCheek.be Fact Sheet for Healthcare Providers: GravelBags.it This test is not yet approved or cleared by the [place] FDA and  has been authorized for detection and/or diagnosis of SARS-CoV-2 by  FDA under an Emergency Use Authorization (EUA). This EUA will remain  in effect (meaning this test can be used) for the duration of the  Covid-19 declaration under Section 564(b)(1) of the Act, 21  U.S.C. section 360bbb-3(b)(1), unless the authorization is  terminated or revoked. Performed at [facility], [address] [place], [place] [address]     Coagulation Studies: No results for input(s): LABPROT, INR in the last 72 hours.  Urinalysis: Recent Labs    02/01/20 0415  COLORURINE COLORLESS*  LABSPEC 1.010  PHURINE 6.0  GLUCOSEU 50*  HGBUR NEGATIVE  BILIRUBINUR NEGATIVE  KETONESUR NEGATIVE  PROTEINUR 100*  NITRITE NEGATIVE  LEUKOCYTESUR NEGATIVE      Imaging: DG Chest Port 1 View  Result Date: 02/01/2020 CLINICAL DATA:  Dyspnea EXAM: PORTABLE CHEST 1 VIEW COMPARISON:  06/24/2019 FINDINGS: Cardiac shadow is stable. Aortic calcifications are again seen. Mild vascular congestion is noted with very mild interstitial edema. No sizable effusion is noted. No focal infiltrate is seen. No bony abnormality is noted. IMPRESSION: Early CHF. Electronically Signed   By: Inez Catalina M.D.   On: 02/01/2020 03:57      Assessment & Plan: Brittney [name] is a 79 y.o. black female with hypertension, GERD, diabetes mellitus type II, congestive heart failure, aortic valve stenosis, who was admitted to [facility] on 02/01/2020 for Acute exacerbation of CHF (congestive heart failure) ([place]) [I50.9] Acute on chronic congestive heart failure, unspecified heart failure type ([place]) [I50.9]   1. 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  Chronic kidney disease secondary to diabetic nephropathy Acute renal failure most likely secondary to acute cardiorenal syndrome.  Allergy to ACE-I: quinapril - check renal ultrasound - check urine studies - Holding losartan  2. Hypertension with acute exacerbation of congestive heart failure. Home regimen of amlodipine, furosemide, hydralazine, isosorbide mononitrate, and losartan - IV Furosemide - Echocardiogram pending  3. Diabetes mellitus type II with chronic kidney disease: insulin dependent. Hemoglobin A1c of 8.4% - hold metformin  4. Anemia with chronic kidney disease: hemoglobin 9.9    LOS: 0 Shaquile Lutze 2/5/20215:13 PM

## 2020-02-01 NOTE — ED Notes (Signed)
Assisted patient to restroom.

## 2020-02-01 NOTE — ED Notes (Signed)
Pt assisted to bathroom at this time. Pt ambulatory without assistance other than disconnecting monitors. Steady on feet, denies dizziness or lightheaded feeling while up.

## 2020-02-01 NOTE — ED Notes (Signed)
Patient recently received COVID vaccine

## 2020-02-01 NOTE — H&P (Signed)
History and Physical    Brittney [name] WCH:[id number] DOB: Aug 20, 1941 DOA: 02/01/2020  PCP: Elisabeth Cara, NP  Patient coming from: Shortness of breath  Chief Complaint: Shortness of breath  HPI: Brittney [name] is a 79 y.o. female with medical history significant of CHF EF 45 to 50% 12/2018, CKD, diabetes, GERD, hyperlipidemia, hypertension, vitamin D deficiency presented with shortness of breath, generalized weakness.   Per patient chart review, patient noticed shortness of breath worsening this morning, also feel weakness.  However patient denies chest pain, cough, wheezing.  Patient also report  bilateral lower extremity swelling, reports drinking a lot of water every day including coffee, diet soda.  Patient reports no headache, blurred vision, dizziness, fever, chills, chest pain, palpitation, abdominal pain,  nausea, vomiting, diarrhea, dysuria.  ED Course: In ED, patient was found temperature 98.7, heart rate 91-1 06, now 96, respiratory rate 24, blood pressure 156/65, oxygen saturation 97% on room air.  Labs shows creatinine 1.48, baseline 0.99 07/2019 BNP 190, high sensitive troponin 38.  WBC 13.5, hemoglobin 9.9, otherwise unremarkable CBC SARS-CoV-2 negative, flu negative.  In the ED patient was given aspirin,  Review of Systems: As per HPI otherwise 10 point review of systems negative.  All others reviewed and are negative, except stated in HPI   Past Medical History:  Diagnosis Date  . Aortic valve stenosis   . Carotid bruit   . CHF (congestive heart failure) ([place])   . Chronic kidney disease   . Diabetes mellitus without complication ([place])   . GERD (gastroesophageal reflux disease)   . Heart murmur   . Hyperlipidemia   . Hypertension   . Pneumonia   . Vitamin D deficiency     Past Surgical History:  Procedure Laterality Date  . COLONOSCOPY    . COLONOSCOPY WITH PROPOFOL N/A 08/02/2019   Procedure: COLONOSCOPY WITH PROPOFOL;  Surgeon: Virgel Manifold, MD;   Location: [facility] ENDOSCOPY;  Service: Endoscopy;  Laterality: N/A;  . CORONARY ANGIOPLASTY    . ESOPHAGOGASTRODUODENOSCOPY N/A 06/26/2019   Procedure: ESOPHAGOGASTRODUODENOSCOPY (EGD);  Surgeon: Virgel Manifold, MD;  Location: [facility] ENDOSCOPY;  Service: Endoscopy;  Laterality: N/A;  . ESOPHAGOGASTRODUODENOSCOPY (EGD) WITH PROPOFOL N/A 08/02/2019   Procedure: ESOPHAGOGASTRODUODENOSCOPY (EGD) WITH PROPOFOL;  Surgeon: Virgel Manifold, MD;  Location: [facility] ENDOSCOPY;  Service: Endoscopy;  Laterality: N/A;  . LEFT HEART CATH AND CORONARY ANGIOGRAPHY N/A 01/23/2019   Procedure: LEFT HEART CATH AND CORONARY ANGIOGRAPHY;  Surgeon: Yolonda Kida, MD;  [facility];  Service: Cardiovascular;  Laterality: N/A;  . ovarian cyst removal       reports that she quit smoking about 18 months ago. Her smoking use included cigarettes. She has a 40.00 pack-year smoking history. She has never used smokeless tobacco. She reports that she does not drink alcohol or use drugs.  Allergies  Allergen Reactions  . Accupril [Quinapril Hcl]     Family History  Problem Relation Age of Onset  . Diabetes Neg Hx   . Hypertension Neg Hx   . Breast cancer Neg Hx      Prior to Admission medications   Medication Sig Start Date End Date Taking? Authorizing Provider  acetaminophen (TYLENOL) 325 MG tablet Take 2 tablets (650 mg total) by mouth every 6 (six) hours as needed for mild pain (or Fever >/= 101). 06/26/19   Gouru, Illene Silver, MD  ALBUTEROL IN Inhale 90 mcg into the lungs every 6 (six) hours as needed (wheezing).    [provider]  amLODipine (NORVASC) 10 MG tablet Take 10 mg by mouth daily.    [provider]  aspirin EC 81 MG EC tablet Take 1 tablet (81 mg total) by mouth daily. 06/27/19   Gouru, Illene Silver, MD  atorvastatin (LIPITOR) 40 MG tablet Take 1 tablet by mouth daily. 07/04/18   [provider]  cetirizine (ZYRTEC) 10 MG tablet Take 10 mg by mouth daily.     [provider]  Choline Fenofibrate 45 MG capsule Take 90 mg by mouth daily.    [provider]  furosemide (LASIX) 40 MG tablet Take 1 tablet (40 mg total) by mouth daily. 06/26/19   Gouru, Illene Silver, MD  glipiZIDE (GLUCOTROL XL) 10 MG 24 hr tablet Take 10 mg by mouth 2 (two) times daily.    [provider]  hydrALAZINE (APRESOLINE) 25 MG tablet Take 25 mg by mouth 3 (three) times daily. Taking twice a day    [provider]  isosorbide mononitrate (IMDUR) 30 MG 24 hr tablet Take 30 mg by mouth daily.    [provider]  Liraglutide (VICTOZA) 18 MG/3ML SOPN Inject 1.8 mg into the skin every morning.    [provider]  losartan (COZAAR) 100 MG tablet Take 100 mg by mouth daily.    [provider]  metFORMIN (GLUCOPHAGE) 1000 MG tablet Take 1,000 mg by mouth 2 (two) times daily with a meal. Just started back. Not sure how many mg    [provider]  metoprolol tartrate (LOPRESSOR) 25 MG tablet Take 1 tablet (25 mg total) by mouth 2 (two) times daily. 06/26/19   Gouru, Illene Silver, MD  pantoprazole (PROTONIX) 40 MG tablet Take 1 tablet (40 mg total) by mouth 2 (two) times daily. 06/26/19   Gouru, Illene Silver, MD  sitaGLIPtin (JANUVIA) 100 MG tablet Take 100 mg by mouth daily.    [provider]    Physical Exam: Vitals:   02/01/20 0500 02/01/20 0530 02/01/20 0600 02/01/20 0630  BP: (!) 143/54 (!) 129/59 (!) 147/63 (!) 156/65  Pulse: (!) 103 97 91 96  Resp: (!) 25 (!) 25 (!) 23 (!) 24  Temp:      TempSrc:      SpO2: 95% 94% 94% 97%  Weight:      Height:        Constitutional: NAD, calm, comfortable Vitals:   02/01/20 0500 02/01/20 0530 02/01/20 0600 02/01/20 0630  BP: (!) 143/54 (!) 129/59 (!) 147/63 (!) 156/65  Pulse: (!) 103 97 91 96  Resp: (!) 25 (!) 25 (!) 23 (!) 24  Temp:      TempSrc:      SpO2: 95% 94% 94% 97%  Weight:      Height:       Eyes: PERRL, lids and conjunctivae normal ENMT: Mucous membranes are moist.  Posterior pharynx clear of any exudate or lesions.Normal dentition.  Neck: normal, supple, no masses, no thyromegaly Respiratory: clear to auscultation bilaterally, no wheezing, no crackles. Normal respiratory effort. No accessory muscle use.  Cardiovascular: Regular rate and rhythm, no murmurs / rubs / gallops. No extremity edema. 2+ pedal pulses. No carotid bruits.  Abdomen: no tenderness, no masses palpated. No hepatosplenomegaly. Bowel sounds positive.  Musculoskeletal: no clubbing / cyanosis. No joint deformity upper and lower extremities. Good ROM, no contractures. Normal muscle tone.  Skin: no rashes, lesions, ulcers. No induration Neurologic: CN 2-12 grossly intact. Sensation intact, DTR normal. Strength 5/5 in all 4.  Psychiatric: Normal judgment and  insight. Alert and oriented x 3. Normal mood.   (Anything < 9 systems with 2 bullets each down codes to level 1) (If patient refuses exam can't bill higher level) (Make sure to document decubitus ulcers present on admission -- if possible -- and whether patient has chronic indwelling catheter at time of admission)  Labs on Admission: I have personally reviewed following labs and imaging studies  CBC: Recent Labs  Lab 02/01/20 0445  WBC 13.5*  NEUTROABS 10.0*  HGB 9.9*  HCT 30.9*  MCV 81.3  PLT 250   Basic Metabolic Panel: No results for input(s): NA, K, CL, CO2, GLUCOSE, BUN, CREATININE, CALCIUM, MG, PHOS in the last 168 hours. GFR: CrCl cannot be calculated (Patient's most recent lab result is older than the maximum 21 days allowed.). Liver Function Tests: No results for input(s): AST, ALT, ALKPHOS, BILITOT, PROT, ALBUMIN in the last 168 hours. No results for input(s): LIPASE, AMYLASE in the last 168 hours. No results for input(s): AMMONIA in the last 168 hours. Coagulation Profile: No results for input(s): INR, PROTIME in the last 168 hours. Cardiac Enzymes: No results for input(s): CKTOTAL, CKMB, CKMBINDEX, TROPONINI in  the last 168 hours. BNP (last 3 results) No results for input(s): PROBNP in the last 8760 hours. HbA1C: No results for input(s): HGBA1C in the last 72 hours. CBG: No results for input(s): GLUCAP in the last 168 hours. Lipid Profile: No results for input(s): CHOL, HDL, LDLCALC, TRIG, CHOLHDL, LDLDIRECT in the last 72 hours. Thyroid Function Tests: No results for input(s): TSH, T4TOTAL, FREET4, T3FREE, THYROIDAB in the last 72 hours. Anemia Panel: No results for input(s): VITAMINB12, FOLATE, FERRITIN, TIBC, IRON, RETICCTPCT in the last 72 hours. Urine analysis:    Component Value Date/Time   COLORURINE YELLOW (A) 08/02/2019 1353   APPEARANCEUR CLEAR (A) 08/02/2019 1353   LABSPEC 1.026 08/02/2019 1353   PHURINE 5.0 08/02/2019 1353   GLUCOSEU >=500 (A) 08/02/2019 1353   HGBUR NEGATIVE 08/02/2019 1353   BILIRUBINUR NEGATIVE 08/02/2019 1353   KETONESUR 5 (A) 08/02/2019 1353   PROTEINUR 100 (A) 08/02/2019 1353   NITRITE NEGATIVE 08/02/2019 1353   LEUKOCYTESUR NEGATIVE 08/02/2019 1353    Radiological Exams on Admission: DG Chest Port 1 View  Result Date: 02/01/2020 CLINICAL DATA:  Dyspnea EXAM: PORTABLE CHEST 1 VIEW COMPARISON:  06/24/2019 FINDINGS: Cardiac shadow is stable. Aortic calcifications are again seen. Mild vascular congestion is noted with very mild interstitial edema. No sizable effusion is noted. No focal infiltrate is seen. No bony abnormality is noted. IMPRESSION: Early CHF. Electronically Signed   By: Inez Catalina M.D.   On: 02/01/2020 03:57    EKG: Independently reviewed.  Sinus tachycardia, no significant acute ischemic change  Assessment/Plan Principal Problem:   Acute exacerbation of CHF (congestive heart failure) (HCC) Active Problems:   Benign essential HTN   DM (diabetes mellitus) (HCC)   Elevated troponin   #Shortness of breath: Etiology most likely is decompensated  heart failure, other etiologies including ACS given elevated troponin. Given patient  history of CHF, not on fluid restriction. ED given IV Lasix, will continue, Cardiology consult pending, appreciate recommendations  #Troponin: Most likely demand ischemia secondary to CHF, see above, however cannot rule out ACS although patient has no chest pain EKG no significant acute ischemic change. Troponin slightly trending up, will cycle troponin, EKG Cardiology consult pending, appreciate recommendations  #possible AKI: now Cr 1.48, baseline Cr 0.99 Etiology unclear now. Obstruction/Pre-renal/ATN/?AIN,  Patient is Lasix and losartan at home. Less likely  DDx include but less likely SLE, multiple myeloma, renal arterial stenosis  RTA,  Bladder scan - rule out  obstruction. Foley catheter if needed. UA pending In the setting of decompensated CHF, will continue Lasix.  Hold losartan. Follow BMPs.  Nephrology consult as needed.  #history significant of CHF EF 45 to 50% 12/2018, CKD, diabetes, GERD, hyperlipidemia, hypertension, vitamin D deficiency: Established Active problems see above Continue home medications if appropriate follow-up with his PCP, specialist when necessary     DVT prophylaxis: Heparin (Lovenox/Heparin/SCD's/anticoagulated/None (if comfort care) Code Status: Full (Full/Partial (specify details) Family Communication: No family at bedside (Specify name, relationship. Do not write "discussed with patient". Specify tel # if discussed over the phone) Disposition Plan: Inpatient (specify when and where you expect patient to be discharged) Consults called: Cardiology, nephrology (with names) Admission status: Inpatient (inpatient / obs / tele / medical floor / SDU)   Lenna Sciara MD [facility] Pager [phone]  If 7PM-7AM, please contact night-coverage [email or web address] Password [email or web address]  02/01/2020, 7:12 AM

## 2020-02-01 NOTE — ED Notes (Signed)
Patient's legs are edematous

## 2020-02-02 DIAGNOSIS — I5033 Acute on chronic diastolic (congestive) heart failure: Secondary | ICD-10-CM

## 2020-02-02 LAB — BASIC METABOLIC PANEL
Anion gap: 10 (ref 5–15)
BUN: 24 mg/dL — ABNORMAL HIGH (ref 8–23)
CO2: 29 mmol/L (ref 22–32)
Calcium: 9.3 mg/dL (ref 8.9–10.3)
Chloride: 102 mmol/L (ref 98–111)
Creatinine, Ser: 1.38 mg/dL — ABNORMAL HIGH (ref 0.44–1.00)
GFR calc Af Amer: 42 mL/min — ABNORMAL LOW (ref >60)
GFR calc non Af Amer: 36 mL/min — ABNORMAL LOW (ref >60)
Glucose, Bld: 193 mg/dL — ABNORMAL HIGH (ref 70–99)
Potassium: 3.5 mmol/L (ref 3.5–5.1)
Sodium: 141 mmol/L (ref 135–145)

## 2020-02-02 LAB — GLUCOSE, CAPILLARY
Glucose-Capillary: 172 mg/dL — ABNORMAL HIGH (ref 70–99)
Glucose-Capillary: 340 mg/dL — ABNORMAL HIGH (ref 70–99)
Glucose-Capillary: 221 mg/dL — ABNORMAL HIGH (ref 70–99)
Glucose-Capillary: 272 mg/dL — ABNORMAL HIGH (ref 70–99)

## 2020-02-02 LAB — VITAMIN B12: Vitamin B-12: 183 pg/mL (ref 180–914)

## 2020-02-02 LAB — HEPATITIS B CORE ANTIBODY, IGM: Hep B C IgM: NONREACTIVE

## 2020-02-02 LAB — HEPATITIS B SURFACE ANTIGEN: Hepatitis B Surface Ag: NONREACTIVE

## 2020-02-02 LAB — HEPATITIS B SURFACE ANTIBODY,QUALITATIVE: Hep B S Ab: NONREACTIVE

## 2020-02-02 LAB — IRON AND TIBC
Iron: 48 ug/dL (ref 28–170)
Saturation Ratios: 14 % (ref 10.4–31.8)
TIBC: 335 ug/dL (ref 250–450)
UIBC: 287 ug/dL

## 2020-02-02 LAB — BRAIN NATRIURETIC PEPTIDE: B Natriuretic Peptide: 247 pg/mL — ABNORMAL HIGH (ref 0.0–100.0)

## 2020-02-02 LAB — FERRITIN: Ferritin: 26 ng/mL (ref 11–307)

## 2020-02-02 LAB — PHOSPHORUS: Phosphorus: 4.2 mg/dL (ref 2.5–4.6)

## 2020-02-02 LAB — HEPATITIS C ANTIBODY: HCV Ab: NONREACTIVE

## 2020-02-02 LAB — FOLATE: Folate: 48.3 ng/mL (ref >5.9)

## 2020-02-02 MED ORDER — FUROSEMIDE 40 MG PO TABS
40.0000 mg | ORAL_TABLET | Freq: Two times a day (BID) | ORAL | Status: DC
  Administered 2020-02-02 – 2020-02-03 (×3): 40 mg via ORAL
  Filled 2020-02-02 (×3): qty 1

## 2020-02-02 NOTE — Progress Notes (Signed)
Patient Name: Brittney [name] Date of Encounter: 02/02/2020  Hospital Problem List     Principal Problem:   Acute exacerbation of CHF (congestive heart failure) (HCC) Active Problems:   Benign essential HTN   DM (diabetes mellitus) ([place])   Elevated troponin    Patient Profile     79 y.o. female with history of diabetes mellitus, hyperlipidemia, hypertension as well as COPD presented to the emergency room with complaints of acute shortness of breath. She was noted to have sob and bilateral lower extremety edema.   Chest x-ray suggested early pulmonary edema. Mild troponin elevation. EKG showed sinus tachycardia with no ischemia. Echo 01/02/2019 revealed mildly reduced LV function with ef of 45-50%. No significant valvular abnormalities. Cardiac cath 12/2018 revealed ef 65% with moderate to severe disease in the proximial and distal lad with mid to distal rca stenosis felt to be a candidate for medical therapy due to distal disease. Also noted to have acute on chronic renal insuffiency with creat up to 1.48 from baseline of 0.99.  BNP mildly elevated and improved from previous values. Mild anemia with hgb of 9.9. She is COVID-19 negative by Ag. She was given iv lasix and oxygen.  Patient feels a lot better since coming to the ER.  She states her symptoms worsened overnight.  She did have some peripheral edema which gradually worsened over the previous several days.  She feels better this morning after diuresing approximately 2 L.  Will recheck BNP today.  Subjective   Less short of breath.  No chest pain.  Ruled out for myocardial infarction.  Inpatient Medications    . amLODipine  10 mg Oral Daily  . aspirin EC  81 mg Oral Daily  . atorvastatin  40 mg Oral q1800  . fenofibrate  54 mg Oral Daily  . furosemide  40 mg Intravenous BID  . heparin  5,000 Units Subcutaneous Q8H  . hydrALAZINE  25 mg Oral TID  . insulin aspart  0-9 Units Subcutaneous TID WC  . insulin glargine  16 Units  Subcutaneous QHS  . isosorbide mononitrate  30 mg Oral Daily  . loratadine  10 mg Oral Daily  . metoprolol tartrate  50 mg Oral BID  . multivitamin with minerals  1 tablet Oral Daily  . pantoprazole  40 mg Oral BID  . potassium chloride  10 mEq Oral Daily  . sodium chloride flush  3 mL Intravenous Q12H    Vital Signs    Vitals:   02/01/20 1928 02/01/20 2345 02/02/20 0310 02/02/20 0740  BP: (!) 151/67 (!) 132/54 (!) 146/62 (!) 156/53  Pulse: 75 81 77 67  Resp: 19 19 19 19   Temp: 98 F (36.7 C) 98.2 F (36.8 C) 98.2 F (36.8 C) (!) 97.5 F (36.4 C)  TempSrc: Oral Oral Oral Oral  SpO2: 99% 97% 97% 98%  Weight:   77.3 kg   Height:        Intake/Output Summary (Last 24 hours) at 02/02/2020 0859 Last data filed at 02/02/2020 0858 Gross per 24 hour  Intake -  Output 1600 ml  Net -1600 ml   Filed Weights   02/01/20 0328 02/01/20 1300 02/02/20 0310  Weight: 78.5 kg 78.3 kg 77.3 kg    Physical Exam    GEN: Well nourished, well developed, in no acute distress.  HEENT: normal.  Neck: Supple, no JVD, carotid bruits, or masses. Cardiac: RRR, 2/6 systolic murmur radials/DP/PT 2+ and equal bilaterally.  Respiratory:  Respirations regular  and unlabored, clear to auscultation bilaterally. GI: Soft, nontender, nondistended, BS + x 4. MS: no deformity or atrophy. Skin: warm and dry, no rash. Neuro:  Strength and sensation are intact. Psych: Normal affect.  Labs    CBC Recent Labs    02/01/20 0445  WBC 13.5*  NEUTROABS 10.0*  HGB 9.9*  HCT 30.9*  MCV 81.3  PLT [id number]   Basic Metabolic Panel Recent Labs    02/01/20 0515  NA 139  K 3.7  CL 105  CO2 19*  GLUCOSE 307*  BUN 29*  CREATININE 1.48*  CALCIUM 8.8*   Liver Function Tests Recent Labs    02/01/20 0515  AST 19  ALT 13  ALKPHOS 46  BILITOT 0.7  PROT 6.9  ALBUMIN 3.6   No results for input(s): LIPASE, AMYLASE in the last 72 hours. Cardiac Enzymes No results for input(s): CKTOTAL, CKMB, CKMBINDEX,  TROPONINI in the last 72 hours. BNP Recent Labs    02/01/20 0445  BNP 190.0*   D-Dimer No results for input(s): DDIMER in the last 72 hours. Hemoglobin A1C Recent Labs    02/01/20 0333  HGBA1C 8.4*   Fasting Lipid Panel No results for input(s): CHOL, HDL, LDLCALC, TRIG, CHOLHDL, LDLDIRECT in the last 72 hours. Thyroid Function Tests No results for input(s): TSH, T4TOTAL, T3FREE, THYROIDAB in the last 72 hours.  Invalid input(s): FREET3  Telemetry    Normal sinus rhythm  ECG    Sinus rhythm with no ischemia  Radiology    US RENAL  Result Date: 02/01/2020 CLINICAL DATA:  Acute kidney injury EXAM: RENAL / URINARY TRACT ULTRASOUND COMPLETE COMPARISON:  Renal ultrasound August 06, 2018, CT abdomen pelvis 06/24/2019 FINDINGS: Right Kidney: Renal measurements: 10.8 x 4.4 x 4.6 cm the = volume: 1 4 mL . Echogenicity within normal limits. No mass or hydronephrosis visualized. Left Kidney: Renal measurements: 12.3 x 4.6 x 4.1 cm = volume: 124 mL. Echogenicity within normal limits. No mass or hydronephrosis visualized. Bladder: Partially decompressed at the time of exam. Appears normal for degree of bladder distention. Other: None. IMPRESSION: Unremarkable renal ultrasound. Renal cysts seen on comparison CT are not well visualized on this exam. Electronically Signed   By: Lovena Le M.D.   On: 02/01/2020 19:46   DG Chest Port 1 View  Result Date: 02/01/2020 CLINICAL DATA:  Dyspnea EXAM: PORTABLE CHEST 1 VIEW COMPARISON:  06/24/2019 FINDINGS: Cardiac shadow is stable. Aortic calcifications are again seen. Mild vascular congestion is noted with very mild interstitial edema. No sizable effusion is noted. No focal infiltrate is seen. No bony abnormality is noted. IMPRESSION: Early CHF. Electronically Signed   By: Inez Catalina M.D.   On: 02/01/2020 03:57    Assessment & Plan    79 year old female with history of diabetes, hyperlipidemia, hypertension as well as history of HFpEF with most  recent echo done in 2020 in January showing an EF 45 to 50% with a cath in January 2020 felt to show a normal ejection fraction.  She had moderate to severe distal disease felt to be a candidate for medical management.  She now presents with increasing shortness of breath and evidence of early congestive heart failure on chest x-ray.  She denies chest pain.  Serum troponin is mildly elevated without EKG changes and chest pain is suggestive of demand ischemia.  She is hemodynamically stable feeling well states she has been compliant with her medications.  She has a mild acute on chronic renal insufficiency with a creatinine  up to 1.48 from a baseline of normal.  Her BNP is only mildly elevated and less elevated than on previous admissions.  1.  Coronary artery disease    Status post cardiac catheterization in January of last year showing diffuse distal disease felt to be a candidate for medical management.  Mild troponin elevation.  No chest pain or obvious ischemia.  Elevated troponin consistent with demand given her known anatomy.  We will continue with medical management including aspirin, amlodipine 10 mg daily, Imdur 30 mg daily, metoprolol tartrate 50 mg twice daily.  Follow-up for further ischemia.  No invasive evaluation indicated at present.  2.  HFpEF   We will recheck BNP today.  Has diuresed approximately 2 L.  No significant pulmonary edema edema peripherally.  Will attempt to convert to p.o. Lasix and 40 mg twice daily and discontinue IV Lasix.  Patient appears to be nearing euvolemia.  Will evaluate BNP when available.  Continue with low-sodium diet  3.  Acute kidney injury   Appreciate nephrology evaluation.  BNP and renal function pending from today.  We will continue to hold losartan.  Further renal work-up pending.  4.  Hypertension   Continue with furosemide will attempt p.o. unless deferred by nephrology.  We will continue with home regimen of amlodipine, hydralazine, isosorbide  otherwise.  5.  Diabetes mellitus with chronic kidney disease    Off Metformin due to renal function Signed, Javier Docker. Varun Jourdan MD 02/02/2020, 8:59 AM  Pager: [phone]

## 2020-02-02 NOTE — Progress Notes (Signed)
PROGRESS NOTE    DOMINIQUA [name]  [id number] DOB: 1941-02-07 DOA: 02/01/2020 PCP: Elisabeth Cara, NP   Brief Narrative:  HPI: Brittney [name] is a 79 y.o. female with medical history significant of CHF EF 45 to 50% 12/2018, CKD, diabetes, GERD, hyperlipidemia, hypertension, vitamin D deficiency presented with shortness of breath, generalized weakness.   Per patient chart review, patient noticed shortness of breath worsening this morning, also feel weakness.  However patient denies chest pain, cough, wheezing.  Patient also report  bilateral lower extremity swelling, reports drinking a lot of water every day including coffee, diet soda.  Patient reports no headache, blurred vision, dizziness, fever, chills, chest pain, palpitation, abdominal pain,  nausea, vomiting, diarrhea, dysuria.  2/6: Diuresing well.  Still not quite at goal.  Seen by cardiology this morning.  Recommending 1 additional day of inpatient stay for management and optimization of heart failure.   Assessment & Plan:   Principal Problem:   Acute exacerbation of CHF (congestive heart failure) (HCC) Active Problems:   Benign essential HTN   DM (diabetes mellitus) (HCC)   Elevated troponin  Acute on chronic decompensated diastolic heart failure BNP minimally elevated at 247, appears to be trending up paradoxically Diuresing well, approximately 2 L since admission No overt pulmonary edema Lasix converted to p.o. 40 mg twice daily per cardiology Continue low-sodium diet Continue daily weights Strict I's and O's  Coronary artery disease Patient has diffuse disease noted on cath in January 2020 No PCI, recommended medical management at that time Troponins minimally elevated, consistent with demand ischemia Continue aspirin 81 mg daily Amlodipine 10 mg daily Imdur 30 mg daily Toprol tartrate 50 mg twice daily No inpatient ischemic evaluation recommended at this time  Acute kidney injury on chronic kidney  disease stage IIIa Renal ultrasound unrevealing Urinalysis essentially bland Nephrology following, recommendations appreciated Further work-up pending  Hypertension Furosemide as above Continue amlodipine and Imdur Continue beta-blocker  Diabetes mellitus with chronic kidney disease Last hemoglobin A1c 8.4% Metformin on hold Insulin, basal/bolus regimen  Anemia of chronic kidney disease Hemoglobin stable No negation for transfusion Transfuse as needed hemoglobin less than 7   DVT prophylaxis: SQ H Code Status: Full Family Communication: None today Disposition Plan: Anticipate home, 24 hours.  Pending resolution of decompensated heart failure  Consultants:   Cardiology-[facility], [place]  Procedures:   None  Antimicrobials: None  Subjective: Patient seen and examined Shortness of breath improved On room air No complaints  Objective: Vitals:   02/01/20 2345 02/02/20 0310 02/02/20 0740 02/02/20 1131  BP: (!) 132/54 (!) 146/62 (!) 156/53 (!) 146/58  Pulse: 81 77 67 68  Resp: 19 19 19 18   Temp: 98.2 F (36.8 C) 98.2 F (36.8 C) (!) 97.5 F (36.4 C) (!) 97.5 F (36.4 C)  TempSrc: Oral Oral Oral Oral  SpO2: 97% 97% 98% 97%  Weight:  77.3 kg    Height:        Intake/Output Summary (Last 24 hours) at 02/02/2020 1331 Last data filed at 02/02/2020 0858 Gross per 24 hour  Intake --  Output 1600 ml  Net -1600 ml   Filed Weights   02/01/20 0328 02/01/20 1300 02/02/20 0310  Weight: 78.5 kg 78.3 kg 77.3 kg    Examination:  General exam: Appears calm and comfortable  Respiratory system: Bibasilar crackles.  Normal work of breathing Cardiovascular system: S1 & S2 heard, RRR. No JVD, murmurs, rubs, gallops or clicks.  Trace edema bilaterally Gastrointestinal system: Abdomen is nondistended,  soft and nontender. No organomegaly or masses felt. Normal bowel sounds heard. Central nervous system: Alert and oriented. No focal neurological deficits. Extremities:  Symmetric 5 x 5 power. Skin: No rashes, lesions or ulcers Psychiatry: Judgement and insight appear normal. Mood & affect appropriate.     Data Reviewed: I have personally reviewed following labs and imaging studies  CBC: Recent Labs  Lab 02/01/20 0445  WBC 13.5*  NEUTROABS 10.0*  HGB 9.9*  HCT 30.9*  MCV 81.3  PLT 123XX123   Basic Metabolic Panel: Recent Labs  Lab 02/01/20 0515 02/02/20 0553  NA 139 141  K 3.7 3.5  CL 105 102  CO2 19* 29  GLUCOSE 307* 193*  BUN 29* 24*  CREATININE 1.48* 1.38*  CALCIUM 8.8* 9.3  PHOS  --  4.2   GFR: Estimated Creatinine Clearance: 35.4 mL/min (A) (by C-G formula based on SCr of 1.38 mg/dL (H)). Liver Function Tests: Recent Labs  Lab 02/01/20 0515  AST 19  ALT 13  ALKPHOS 46  BILITOT 0.7  PROT 6.9  ALBUMIN 3.6   No results for input(s): LIPASE, AMYLASE in the last 168 hours. No results for input(s): AMMONIA in the last 168 hours. Coagulation Profile: No results for input(s): INR, PROTIME in the last 168 hours. Cardiac Enzymes: No results for input(s): CKTOTAL, CKMB, CKMBINDEX, TROPONINI in the last 168 hours. BNP (last 3 results) No results for input(s): PROBNP in the last 8760 hours. HbA1C: Recent Labs    02/01/20 0333  HGBA1C 8.4*   CBG: Recent Labs  Lab 02/01/20 1655 02/01/20 2105 02/02/20 0737 02/02/20 1128  GLUCAP 219* 225* 172* 340*   Lipid Profile: No results for input(s): CHOL, HDL, LDLCALC, TRIG, CHOLHDL, LDLDIRECT in the last 72 hours. Thyroid Function Tests: No results for input(s): TSH, T4TOTAL, FREET4, T3FREE, THYROIDAB in the last 72 hours. Anemia Panel: Recent Labs    02/02/20 0553  FOLATE 48.3  FERRITIN 26  TIBC 335  IRON 48   Sepsis Labs: No results for input(s): PROCALCITON, LATICACIDVEN in the last 168 hours.  Recent Results (from the past 240 hour(s))  Respiratory Panel by RT PCR (Flu A&B, Covid) - Nasopharyngeal Swab     Status: None   Collection Time: 02/01/20  4:14 AM   Specimen:  Nasopharyngeal Swab  Result Value Ref Range Status   SARS Coronavirus 2 by RT PCR NEGATIVE NEGATIVE Final    Comment: (NOTE) SARS-CoV-2 target nucleic acids are NOT DETECTED. The SARS-CoV-2 RNA is generally detectable in upper respiratoy specimens during the acute phase of infection. The lowest concentration of SARS-CoV-2 viral copies this assay can detect is 131 copies/mL. A negative result does not preclude SARS-Cov-2 infection and should not be used as the sole basis for treatment or other patient management decisions. A negative result may occur with  improper specimen collection/handling, submission of specimen other than nasopharyngeal swab, presence of viral mutation(s) within the areas targeted by this assay, and inadequate number of viral copies (<131 copies/mL). A negative result must be combined with clinical observations, patient history, and epidemiological information. The expected result is Negative. Fact Sheet for Patients:  PinkCheek.be Fact Sheet for Healthcare Providers:  GravelBags.it This test is not yet ap proved or cleared by the [place] FDA and  has been authorized for detection and/or diagnosis of SARS-CoV-2 by FDA under an Emergency Use Authorization (EUA). This EUA will remain  in effect (meaning this test can be used) for the duration of the COVID-19 declaration under Section 564(b)(1) of  the Act, 21 U.S.C. section 360bbb-3(b)(1), unless the authorization is terminated or revoked sooner.    Influenza A by PCR NEGATIVE NEGATIVE Final   Influenza B by PCR NEGATIVE NEGATIVE Final    Comment: (NOTE) The Xpert Xpress SARS-CoV-2/FLU/RSV assay is intended as an aid in  the diagnosis of influenza from Nasopharyngeal swab specimens and  should not be used as a sole basis for treatment. Nasal washings and  aspirates are unacceptable for Xpert Xpress SARS-CoV-2/FLU/RSV  testing. Fact Sheet for  Patients: PinkCheek.be Fact Sheet for Healthcare Providers: GravelBags.it This test is not yet approved or cleared by the [place] FDA and  has been authorized for detection and/or diagnosis of SARS-CoV-2 by  FDA under an Emergency Use Authorization (EUA). This EUA will remain  in effect (meaning this test can be used) for the duration of the  Covid-19 declaration under Section 564(b)(1) of the Act, 21  U.S.C. section 360bbb-3(b)(1), unless the authorization is  terminated or revoked. Performed at [facility], [address] [place], [place] [address]          Radiology Studies: US RENAL  Result Date: 02/01/2020 CLINICAL DATA:  Acute kidney injury EXAM: RENAL / URINARY TRACT ULTRASOUND COMPLETE COMPARISON:  Renal ultrasound August 06, 2018, CT abdomen pelvis 06/24/2019 FINDINGS: Right Kidney: Renal measurements: 10.8 x 4.4 x 4.6 cm the = volume: 1 4 mL . Echogenicity within normal limits. No mass or hydronephrosis visualized. Left Kidney: Renal measurements: 12.3 x 4.6 x 4.1 cm = volume: 124 mL. Echogenicity within normal limits. No mass or hydronephrosis visualized. Bladder: Partially decompressed at the time of exam. Appears normal for degree of bladder distention. Other: None. IMPRESSION: Unremarkable renal ultrasound. Renal cysts seen on comparison CT are not well visualized on this exam. Electronically Signed   By: Lovena Le M.D.   On: 02/01/2020 19:46   DG Chest Port 1 View  Result Date: 02/01/2020 CLINICAL DATA:  Dyspnea EXAM: PORTABLE CHEST 1 VIEW COMPARISON:  06/24/2019 FINDINGS: Cardiac shadow is stable. Aortic calcifications are again seen. Mild vascular congestion is noted with very mild interstitial edema. No sizable effusion is noted. No focal infiltrate is seen. No bony abnormality is noted. IMPRESSION: Early CHF. Electronically Signed   By: Inez Catalina M.D.   On: 02/01/2020 03:57         Scheduled Meds: . amLODipine  10 mg Oral Daily  . aspirin EC  81 mg Oral Daily  . atorvastatin  40 mg Oral q1800  . fenofibrate  54 mg Oral Daily  . furosemide  40 mg Oral BID  . heparin  5,000 Units Subcutaneous Q8H  . hydrALAZINE  25 mg Oral TID  . insulin aspart  0-9 Units Subcutaneous TID WC  . insulin glargine  16 Units Subcutaneous QHS  . isosorbide mononitrate  30 mg Oral Daily  . loratadine  10 mg Oral Daily  . metoprolol tartrate  50 mg Oral BID  . multivitamin with minerals  1 tablet Oral Daily  . pantoprazole  40 mg Oral BID  . potassium chloride  10 mEq Oral Daily  . sodium chloride flush  3 mL Intravenous Q12H   Continuous Infusions: . sodium chloride       LOS: 1 day    Time spent: 35 minutes    Sidney Ace, MD [facility] Pager [phone]  If 7PM-7AM, please contact night-coverage 02/02/2020, 1:31 PM

## 2020-02-02 NOTE — Progress Notes (Signed)
[facility], [place] 02/02/20  Subjective:   LOS: 1 02/05 0701 - 02/06 0700 In: -  Out: 1100 [Urine:1100] Patient is doing fair today Denies any acute complaints Feels like leg edema is getting better  Objective:  Vital signs in last 24 hours:  Temp:  [97.5 F (36.4 C)-98.2 F (36.8 C)] 97.5 F (36.4 C) (02/06 1131) Pulse Rate:  [67-81] 68 (02/06 1131) Resp:  [18-19] 18 (02/06 1131) BP: (132-156)/(53-67) 146/58 (02/06 1131) SpO2:  [97 %-99 %] 97 % (02/06 1131) Weight:  [77.3 kg-78.3 kg] 77.3 kg (02/06 0310)  Weight change: -0.181 kg Filed Weights   02/01/20 0328 02/01/20 1300 02/02/20 0310  Weight: 78.5 kg 78.3 kg 77.3 kg    Intake/Output:    Intake/Output Summary (Last 24 hours) at 02/02/2020 1206 Last data filed at 02/02/2020 0858 Gross per 24 hour  Intake --  Output 1600 ml  Net -1600 ml     Physical Exam: General:  No acute distress, lying in the bed  HEENT  anicteric, moist oral mucous membrane  Pulm/lungs  clear to auscultation  CVS/Heart  regular rhythm, no rub  Abdomen:   Soft, nontender  Extremities:  Trace to 1+ edema  Neurologic:  Alert, oriented  Skin:  Warm, dry          Basic Metabolic Panel:  Recent Labs  Lab 02/01/20 0515 02/02/20 0553  NA 139 141  K 3.7 3.5  CL 105 102  CO2 19* 29  GLUCOSE 307* 193*  BUN 29* 24*  CREATININE 1.48* 1.38*  CALCIUM 8.8* 9.3  PHOS  --  4.2     CBC: Recent Labs  Lab 02/01/20 0445  WBC 13.5*  NEUTROABS 10.0*  HGB 9.9*  HCT 30.9*  MCV 81.3  PLT 362     No results found for: HEPBSAG, HEPBSAB, HEPBIGM    Microbiology:  Recent Results (from the past 240 hour(s))  Respiratory Panel by RT PCR (Flu A&B, Covid) - Nasopharyngeal Swab     Status: None   Collection Time: 02/01/20  4:14 AM   Specimen: Nasopharyngeal Swab  Result Value Ref Range Status   SARS Coronavirus 2 by RT PCR NEGATIVE NEGATIVE Final    Comment: (NOTE) SARS-CoV-2 target nucleic acids are NOT  DETECTED. The SARS-CoV-2 RNA is generally detectable in upper respiratoy specimens during the acute phase of infection. The lowest concentration of SARS-CoV-2 viral copies this assay can detect is 131 copies/mL. A negative result does not preclude SARS-Cov-2 infection and should not be used as the sole basis for treatment or other patient management decisions. A negative result may occur with  improper specimen collection/handling, submission of specimen other than nasopharyngeal swab, presence of viral mutation(s) within the areas targeted by this assay, and inadequate number of viral copies (<131 copies/mL). A negative result must be combined with clinical observations, patient history, and epidemiological information. The expected result is Negative. Fact Sheet for Patients:  PinkCheek.be Fact Sheet for Healthcare Providers:  GravelBags.it This test is not yet ap proved or cleared by the [place] FDA and  has been authorized for detection and/or diagnosis of SARS-CoV-2 by FDA under an Emergency Use Authorization (EUA). This EUA will remain  in effect (meaning this test can be used) for the duration of the COVID-19 declaration under Section 564(b)(1) of the Act, 21 U.S.C. section 360bbb-3(b)(1), unless the authorization is terminated or revoked sooner.    Influenza A by PCR NEGATIVE NEGATIVE Final   Influenza B by PCR  NEGATIVE NEGATIVE Final    Comment: (NOTE) The Xpert Xpress SARS-CoV-2/FLU/RSV assay is intended as an aid in  the diagnosis of influenza from Nasopharyngeal swab specimens and  should not be used as a sole basis for treatment. Nasal washings and  aspirates are unacceptable for Xpert Xpress SARS-CoV-2/FLU/RSV  testing. Fact Sheet for Patients: PinkCheek.be Fact Sheet for Healthcare Providers: GravelBags.it This test is not yet approved or cleared  by the [place] FDA and  has been authorized for detection and/or diagnosis of SARS-CoV-2 by  FDA under an Emergency Use Authorization (EUA). This EUA will remain  in effect (meaning this test can be used) for the duration of the  Covid-19 declaration under Section 564(b)(1) of the Act, 21  U.S.C. section 360bbb-3(b)(1), unless the authorization is  terminated or revoked. Performed at [facility], [address] [place], [place] [address]     Coagulation Studies: No results for input(s): LABPROT, INR in the last 72 hours.  Urinalysis: Recent Labs    02/01/20 0415  COLORURINE COLORLESS*  LABSPEC 1.010  PHURINE 6.0  GLUCOSEU 50*  HGBUR NEGATIVE  BILIRUBINUR NEGATIVE  KETONESUR NEGATIVE  PROTEINUR 100*  NITRITE NEGATIVE  LEUKOCYTESUR NEGATIVE      Imaging: US RENAL  Result Date: 02/01/2020 CLINICAL DATA:  Acute kidney injury EXAM: RENAL / URINARY TRACT ULTRASOUND COMPLETE COMPARISON:  Renal ultrasound August 06, 2018, CT abdomen pelvis 06/24/2019 FINDINGS: Right Kidney: Renal measurements: 10.8 x 4.4 x 4.6 cm the = volume: 1 4 mL . Echogenicity within normal limits. No mass or hydronephrosis visualized. Left Kidney: Renal measurements: 12.3 x 4.6 x 4.1 cm = volume: 124 mL. Echogenicity within normal limits. No mass or hydronephrosis visualized. Bladder: Partially decompressed at the time of exam. Appears normal for degree of bladder distention. Other: None. IMPRESSION: Unremarkable renal ultrasound. Renal cysts seen on comparison CT are not well visualized on this exam. Electronically Signed   By: Lovena Le M.D.   On: 02/01/2020 19:46   DG Chest Port 1 View  Result Date: 02/01/2020 CLINICAL DATA:  Dyspnea EXAM: PORTABLE CHEST 1 VIEW COMPARISON:  06/24/2019 FINDINGS: Cardiac shadow is stable. Aortic calcifications are again seen. Mild vascular congestion is noted with very mild interstitial edema. No sizable effusion is noted. No focal infiltrate is seen. No bony  abnormality is noted. IMPRESSION: Early CHF. Electronically Signed   By: Inez Catalina M.D.   On: 02/01/2020 03:57     Medications:   . sodium chloride     . amLODipine  10 mg Oral Daily  . aspirin EC  81 mg Oral Daily  . atorvastatin  40 mg Oral q1800  . fenofibrate  54 mg Oral Daily  . furosemide  40 mg Oral BID  . heparin  5,000 Units Subcutaneous Q8H  . hydrALAZINE  25 mg Oral TID  . insulin aspart  0-9 Units Subcutaneous TID WC  . insulin glargine  16 Units Subcutaneous QHS  . isosorbide mononitrate  30 mg Oral Daily  . loratadine  10 mg Oral Daily  . metoprolol tartrate  50 mg Oral BID  . multivitamin with minerals  1 tablet Oral Daily  . pantoprazole  40 mg Oral BID  . potassium chloride  10 mEq Oral Daily  . sodium chloride flush  3 mL Intravenous Q12H   sodium chloride, acetaminophen, ondansetron (ZOFRAN) IV, sodium chloride flush  Assessment/ Plan:  79 y.o. female with hypertension, GERD, diabetes, congestive heart failure with EF 45 to 50% in January  2020, aortic valve stenosis was admitted on February 5 for the following:  Principal Problem:   Acute exacerbation of CHF (congestive heart failure) (HCC) Active Problems:   Benign essential HTN   DM (diabetes mellitus) (HCC)   Elevated troponin   #.  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  Baseline creatinine of 1.14/GFR fifty-three from June 26, 2019 Underlying CKD is likely secondary to diabetes and hypertension Acute kidney injury likely secondary to acute cardiorenal syndrome Patient is allergic to ACE inhibitor, but was taking losartan at home Renal ultrasound from February 5 is unremarkable.   Losartan on hold for now but may be able to restart at the time of discharge Overall clinically improved.  Creatinine is also improving  Recent Labs    02/01/20 0515 02/02/20 0553  CREATININE 1.48* 1.38*    #. Anemia of CKD  Lab Results  Component Value Date   HGB 9.9 (L) 02/01/2020  Will follow up as  outpatient  #. Diabetes type 2 with CKD Hgb A1c MFr Bld (%)  Date Value  02/01/2020 8.4 (H)     LOS: [place] 2/6/202112:06 PM  [facility] [place], [place]

## 2020-02-03 LAB — BASIC METABOLIC PANEL
Anion gap: 10 (ref 5–15)
BUN: 30 mg/dL — ABNORMAL HIGH (ref 8–23)
CO2: 30 mmol/L (ref 22–32)
Calcium: 9.2 mg/dL (ref 8.9–10.3)
Chloride: 99 mmol/L (ref 98–111)
Creatinine, Ser: 1.64 mg/dL — ABNORMAL HIGH (ref 0.44–1.00)
GFR calc Af Amer: 34 mL/min — ABNORMAL LOW (ref >60)
GFR calc non Af Amer: 29 mL/min — ABNORMAL LOW (ref >60)
Glucose, Bld: 288 mg/dL — ABNORMAL HIGH (ref 70–99)
Potassium: 3.5 mmol/L (ref 3.5–5.1)
Sodium: 139 mmol/L (ref 135–145)

## 2020-02-03 LAB — PARATHYROID HORMONE, INTACT (NO CA): PTH: 35 pg/mL (ref 15–65)

## 2020-02-03 LAB — GLUCOSE, CAPILLARY: Glucose-Capillary: 259 mg/dL — ABNORMAL HIGH (ref 70–99)

## 2020-02-03 MED ORDER — POTASSIUM CHLORIDE ER 20 MEQ PO TBCR: 20.0000 meq | EXTENDED_RELEASE_TABLET | Freq: Every day | ORAL | 0 refills | Status: DC

## 2020-02-03 MED ORDER — METOPROLOL TARTRATE 50 MG PO TABS: 50.0000 mg | ORAL_TABLET | Freq: Two times a day (BID) | ORAL | 0 refills | Status: DC

## 2020-02-03 MED ORDER — FUROSEMIDE 40 MG PO TABS: 40.0000 mg | ORAL_TABLET | Freq: Two times a day (BID) | ORAL | 0 refills | Status: DC

## 2020-02-03 NOTE — Progress Notes (Signed)
[place], [place] 02/03/20  Subjective:   LOS: 2 02/06 0701 - 02/07 0700 In: -  Out: 2500 [Urine:2500] Patient is doing fair today Denies any acute complaints Leg edema has improved significantly Denies any shortness of breath.  Currently on room air Able to eat without nausea or vomiting Slight increase in creatinine is noted  Objective:  Vital signs in last 24 hours:  Temp:  [97.4 F (36.3 C)-98 F (36.7 C)] 98 F (36.7 C) (02/07 0725) Pulse Rate:  [65-80] 70 (02/07 0725) Resp:  [18-20] 18 (02/07 0725) BP: (141-167)/(62-76) 141/68 (02/07 0725) SpO2:  [96 %-98 %] 97 % (02/07 0725) Weight:  [76.7 kg] 76.7 kg (02/07 0458)  Weight change: -1.633 kg Filed Weights   02/01/20 1300 02/02/20 0310 02/03/20 0458  Weight: 78.3 kg 77.3 kg 76.7 kg    Intake/Output:    Intake/Output Summary (Last 24 hours) at 02/03/2020 1353 Last data filed at 02/03/2020 0612 Gross per 24 hour  Intake --  Output 2000 ml  Net -2000 ml     Physical Exam: General:  No acute distress, lying in the bed  HEENT  anicteric, moist oral mucous membrane  Pulm/lungs  clear to auscultation  CVS/Heart  regular rhythm, no rub  Abdomen:   Soft, nontender  Extremities:  Trace edema  Neurologic:  Alert, oriented  Skin:  Warm, dry          Basic Metabolic Panel:  Recent Labs  Lab 02/01/20 0515 02/02/20 0553 02/03/20 0351  NA 139 141 139  K 3.7 3.5 3.5  CL 105 102 99  CO2 19* 29 30  GLUCOSE 307* 193* 288*  BUN 29* 24* 30*  CREATININE 1.48* 1.38* 1.64*  CALCIUM 8.8* 9.3 9.2  PHOS  --  4.2  --      CBC: Recent Labs  Lab 02/01/20 0445  WBC 13.5*  NEUTROABS 10.0*  HGB 9.9*  HCT 30.9*  MCV 81.3  PLT 362      Lab Results  Component Value Date   HEPBSAG NON REACTIVE 02/02/2020   HEPBSAB NON REACTIVE 02/02/2020   HEPBIGM NON REACTIVE 02/02/2020      Microbiology:  Recent Results (from the past 240 hour(s))  Respiratory Panel by RT PCR (Flu A&B,  Covid) - Nasopharyngeal Swab     Status: None   Collection Time: 02/01/20  4:14 AM   Specimen: Nasopharyngeal Swab  Result Value Ref Range Status   SARS Coronavirus 2 by RT PCR NEGATIVE NEGATIVE Final    Comment: (NOTE) SARS-CoV-2 target nucleic acids are NOT DETECTED. The SARS-CoV-2 RNA is generally detectable in upper respiratoy specimens during the acute phase of infection. The lowest concentration of SARS-CoV-2 viral copies this assay can detect is 131 copies/mL. A negative result does not preclude SARS-Cov-2 infection and should not be used as the sole basis for treatment or other patient management decisions. A negative result may occur with  improper specimen collection/handling, submission of specimen other than nasopharyngeal swab, presence of viral mutation(s) within the areas targeted by this assay, and inadequate number of viral copies (<131 copies/mL). A negative result must be combined with clinical observations, patient history, and epidemiological information. The expected result is Negative. Fact Sheet for Patients:  PinkCheek.be Fact Sheet for Healthcare Providers:  GravelBags.it This test is not yet ap proved or cleared by the [place] FDA and  has been authorized for detection and/or diagnosis of SARS-CoV-2 by FDA under an Emergency Use Authorization (EUA). This EUA will  remain  in effect (meaning this test can be used) for the duration of the COVID-19 declaration under Section 564(b)(1) of the Act, 21 U.S.C. section 360bbb-3(b)(1), unless the authorization is terminated or revoked sooner.    Influenza A by PCR NEGATIVE NEGATIVE Final   Influenza B by PCR NEGATIVE NEGATIVE Final    Comment: (NOTE) The Xpert Xpress SARS-CoV-2/FLU/RSV assay is intended as an aid in  the diagnosis of influenza from Nasopharyngeal swab specimens and  should not be used as a sole basis for treatment. Nasal washings and   aspirates are unacceptable for Xpert Xpress SARS-CoV-2/FLU/RSV  testing. Fact Sheet for Patients: PinkCheek.be Fact Sheet for Healthcare Providers: GravelBags.it This test is not yet approved or cleared by the [place] FDA and  has been authorized for detection and/or diagnosis of SARS-CoV-2 by  FDA under an Emergency Use Authorization (EUA). This EUA will remain  in effect (meaning this test can be used) for the duration of the  Covid-19 declaration under Section 564(b)(1) of the Act, 21  U.S.C. section 360bbb-3(b)(1), unless the authorization is  terminated or revoked. Performed at [facility], [address] [place], [place] [address]     Coagulation Studies: No results for input(s): LABPROT, INR in the last 72 hours.  Urinalysis: Recent Labs    02/01/20 0415  COLORURINE COLORLESS*  LABSPEC 1.010  PHURINE 6.0  GLUCOSEU 50*  HGBUR NEGATIVE  BILIRUBINUR NEGATIVE  KETONESUR NEGATIVE  PROTEINUR 100*  NITRITE NEGATIVE  LEUKOCYTESUR NEGATIVE      Imaging: US RENAL  Result Date: 02/01/2020 CLINICAL DATA:  Acute kidney injury EXAM: RENAL / URINARY TRACT ULTRASOUND COMPLETE COMPARISON:  Renal ultrasound August 06, 2018, CT abdomen pelvis 06/24/2019 FINDINGS: Right Kidney: Renal measurements: 10.8 x 4.4 x 4.6 cm the = volume: 1 4 mL . Echogenicity within normal limits. No mass or hydronephrosis visualized. Left Kidney: Renal measurements: 12.3 x 4.6 x 4.1 cm = volume: 124 mL. Echogenicity within normal limits. No mass or hydronephrosis visualized. Bladder: Partially decompressed at the time of exam. Appears normal for degree of bladder distention. Other: None. IMPRESSION: Unremarkable renal ultrasound. Renal cysts seen on comparison CT are not well visualized on this exam. Electronically Signed   By: Lovena Le M.D.   On: 02/01/2020 19:46     Medications:   . sodium chloride     . amLODipine  10 mg  Oral Daily  . aspirin EC  81 mg Oral Daily  . atorvastatin  40 mg Oral q1800  . fenofibrate  54 mg Oral Daily  . furosemide  40 mg Oral BID  . heparin  5,000 Units Subcutaneous Q8H  . hydrALAZINE  25 mg Oral TID  . insulin aspart  0-9 Units Subcutaneous TID WC  . insulin glargine  16 Units Subcutaneous QHS  . isosorbide mononitrate  30 mg Oral Daily  . loratadine  10 mg Oral Daily  . metoprolol tartrate  50 mg Oral BID  . multivitamin with minerals  1 tablet Oral Daily  . pantoprazole  40 mg Oral BID  . potassium chloride  10 mEq Oral Daily  . sodium chloride flush  3 mL Intravenous Q12H   sodium chloride, acetaminophen, ondansetron (ZOFRAN) IV, sodium chloride flush  Assessment/ Plan:  79 y.o. female with hypertension, GERD, diabetes, congestive heart failure with EF 45 to 50% in January 2020, aortic valve stenosis was admitted on February 5 for the following:  Principal Problem:   Acute exacerbation of CHF (congestive heart failure) (  [place]) Active Problems:   Benign essential HTN   DM (diabetes mellitus) ([place])   Elevated troponin   #.  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  Baseline creatinine of 1.14/GFR 53 from June 26, 2019 Underlying CKD is likely secondary to diabetes and hypertension Acute kidney injury likely secondary to acute cardiorenal syndrome Patient is allergic to ACE inhibitor, but was taking losartan at home  Renal ultrasound from February 5 is unremarkable.   Losartan on hold for now but may be able to restart as outpatient Slight increase in creatinine is noted.  We will follow closely as outpatient  Recent Labs    02/01/20 0515 02/02/20 0553 02/03/20 0351  CREATININE 1.48* 1.38* 1.64*    #. Anemia of CKD  Lab Results  Component Value Date   HGB 9.9 (L) 02/01/2020  Will follow up as outpatient  #. Diabetes type 2 with CKD Hgb A1c MFr Bld (%)  Date Value  02/01/2020 8.4 (H)     LOS: Brittney [name] 2/7/20211:53 [place], [place]

## 2020-02-03 NOTE — Progress Notes (Signed)
Discharge instructions given to patient. Her prescriptions have been electronically sent to her pharmacy. All questions answered.

## 2020-02-03 NOTE — Discharge Summary (Signed)
Physician Discharge Summary  ENGRID VIGGIANO [id number] DOB: November 07, 1941 DOA: 02/01/2020  PCP: Elisabeth Cara, NP  Admit date: 02/01/2020 Discharge date: 02/03/2020  Admitted From: Home Disposition: Home  Recommendations for Outpatient Follow-up:  1. Follow up with PCP in 1-2 weeks 2.   Home Health: No Equipment/Devices: None Discharge Condition: Stable CODE STATUS: Full Diet recommendation: Heart Healthy  Brief/Interim Summary: [id number] J Boldenis a 79 y.o.femalewith medical history significant ofCHF EF 45 to 50% 12/2018, CKD, diabetes, GERD, hyperlipidemia, hypertension, vitamin D deficiency presented with shortness of breath, generalized weakness.   Per patient chart review, patient noticed shortness of breath worsening this morning,also feel weakness. However patient denies chest pain, cough, wheezing.Patient alsoreportbilateral lower extremity swelling,reports drinking a lot of water every day including coffee, diet soda. Patient reports no headache, blurred vision, dizziness, fever, chills, chest pain, palpitation, abdominal pain, nausea, vomiting, diarrhea, dysuria.  2/6: Diuresing well.  Still not quite at goal.  Seen by cardiology this morning.  Recommending 1 additional day of inpatient stay for management and optimization of heart failure.  2/7: Patient seen and examined.  Diuresing well.  Net -4 L during admission.  Case discussed with cardiology.  Recommending discharge on 40 mg p.o. Lasix twice daily with follow-up with patient's established cardiologist at [facility].  Case also discussed with nephrology prior to discharge.  Plan to hold the patient's losartan in the setting of acute kidney injury and recommendations per cardiology to increase Lasix dosing to twice daily.  They will also see patient in clinic as follow-up  Discharge Diagnoses:  Principal Problem:   Acute exacerbation of CHF (congestive heart failure) ([place]) Active Problems:    Benign essential HTN   DM (diabetes mellitus) ([facility])   Elevated troponin  Acute on chronic decompensated diastolic heart failure BNP minimally elevated at 247, appears to be trending up paradoxically Diuresed well, approximately 4 L net negative since admission Some mild bibasilar crackles but no overt pulmonary edema Case discussed with cardiology, cleared for discharge DC on 40 mg p.o. Lasix twice daily, previous home dose of 40 mg once daily Continue metoprolol Continue calcium channel blocker Continue Imdur Continue hydralazine Hold ACE inhibitor Follow-up with [facility] established cardiologist Dr Clayborn Bigness  Coronary artery disease Patient has diffuse disease noted on cath in January 2020 No PCI, recommended medical management at that time Troponins minimally elevated, consistent with demand ischemia Continue aspirin 81 mg daily Amlodipine 10 mg daily Imdur 30 mg daily Toprol tartrate 50 mg twice daily No inpatient ischemic evaluation recommended at this time  Acute kidney injury on chronic kidney disease stage IIIa Renal ultrasound unrevealing Urinalysis essentially bland Nephrology following, recommendations appreciated Discussed with nephrology on the day of discharge Recommending holding losartan on discharge 40 mg p.o. Lasix twice daily as above Follow-up outpatient nephrology  Hypertension Furosemide as above Continue amlodipine and Imdur Continue beta-blocker  Diabetes mellitus with chronic kidney disease Last hemoglobin A1c 8.4% Metformin on hold Insulin, basal/bolus regimen  Anemia of chronic kidney disease Hemoglobin stable No negation for transfusion Transfuse as needed hemoglobin less than 7  Discharge Instructions  Discharge Instructions    Diet - low sodium heart healthy   Complete by: As directed    Increase activity slowly   Complete by: As directed      Allergies as of 02/03/2020      Reactions   Accupril [quinapril Hcl]        Medication List    STOP taking these medications   losartan 100  MG tablet Commonly known as: COZAAR     TAKE these medications   acetaminophen 325 MG tablet Commonly known as: TYLENOL Take 2 tablets (650 mg total) by mouth every 6 (six) hours as needed for mild pain (or Fever >/= 101).   ALBUTEROL IN Inhale 90 mcg into the lungs every 6 (six) hours as needed (wheezing).   amLODipine 10 MG tablet Commonly known as: NORVASC Take 10 mg by mouth daily.   aspirin 81 MG EC tablet Take 1 tablet (81 mg total) by mouth daily.   atorvastatin 40 MG tablet Commonly known as: LIPITOR Take 1 tablet by mouth daily.   cetirizine 10 MG tablet Commonly known as: ZYRTEC Take 10 mg by mouth daily.   Choline Fenofibrate 45 MG capsule Take 90 mg by mouth daily.   furosemide 40 MG tablet Commonly known as: LASIX Take 1 tablet (40 mg total) by mouth 2 (two) times daily. What changed: when to take this   glipiZIDE 10 MG 24 hr tablet Commonly known as: GLUCOTROL XL Take 10 mg by mouth 2 (two) times daily.   hydrALAZINE 25 MG tablet Commonly known as: APRESOLINE Take 25 mg by mouth 3 (three) times daily.   isosorbide mononitrate 30 MG 24 hr tablet Commonly known as: IMDUR Take 30 mg by mouth daily.   Lantus SoloStar 100 UNIT/ML Solostar Pen Generic drug: Insulin Glargine Inject 16 Units into the skin at bedtime.   metFORMIN 1000 MG tablet Commonly known as: GLUCOPHAGE Take 1,000 mg by mouth 2 (two) times daily with a meal. Just started back. Not sure how many mg   metoprolol tartrate 50 MG tablet Commonly known as: LOPRESSOR Take 1 tablet (50 mg total) by mouth 2 (two) times daily. What changed:   medication strength  how much to take   multivitamin with minerals Tabs tablet Take 1 tablet by mouth daily.   pantoprazole 40 MG tablet Commonly known as: PROTONIX Take 1 tablet (40 mg total) by mouth 2 (two) times daily.   Potassium Chloride ER 20 MEQ Tbcr Take 20 mEq  by mouth daily.   saccharomyces boulardii 250 MG capsule Commonly known as: FLORASTOR Take 250 mg by mouth 2 (two) times daily.   sitaGLIPtin 100 MG tablet Commonly known as: JANUVIA Take 100 mg by mouth daily.      Follow-up Information    Callwood, Dwayne D, MD Follow up in 1 week(s).   Specialties: Cardiology, Internal Medicine Contact information: [place] [address] [phone]        [facility] Follow up on 02/06/2020.   Specialty: Cardiology Why: at 10:00am. Enter through the [place] entrance Contact information: [place]         Allergies  Allergen Reactions  . Accupril [Quinapril Hcl]     Consultations: Cardiology-[facility] Nephrology-[facility]  Procedures/Studies: US RENAL  Result Date: 02/01/2020 CLINICAL DATA:  Acute kidney injury EXAM: RENAL / URINARY TRACT ULTRASOUND COMPLETE COMPARISON:  Renal ultrasound August 06, 2018, CT abdomen pelvis 06/24/2019 FINDINGS: Right Kidney: Renal measurements: 10.8 x 4.4 x 4.6 cm the = volume: 1 4 mL . Echogenicity within normal limits. No mass or hydronephrosis visualized. Left Kidney: Renal measurements: 12.3 x 4.6 x 4.1 cm = volume: 124 mL. Echogenicity within normal limits. No mass or hydronephrosis visualized. Bladder: Partially decompressed at the time of exam. Appears normal for degree of bladder distention. Other:  None. IMPRESSION: Unremarkable renal ultrasound. Renal cysts seen on comparison CT are not well visualized on this exam. Electronically Signed   By: Lovena Le M.D.   On: 02/01/2020 19:46   DG Chest Port 1 View  Result Date: 02/01/2020 CLINICAL DATA:  Dyspnea EXAM: PORTABLE CHEST 1 VIEW COMPARISON:  06/24/2019 FINDINGS: Cardiac shadow is stable. Aortic calcifications are again seen. Mild vascular congestion is noted with very mild interstitial  edema. No sizable effusion is noted. No focal infiltrate is seen. No bony abnormality is noted. IMPRESSION: Early CHF. Electronically Signed   By: Inez Catalina M.D.   On: 02/01/2020 03:57    (Echo, Carotid, EGD, Colonoscopy, ERCP)    Subjective: Seen and examined on the day of discharge No complaints, feels well Case discussed with cardiology and nephrology prior to discharge   Discharge Exam: Vitals:   02/03/20 0458 02/03/20 0725  BP: (!) 144/76 (!) 141/68  Pulse: 77 70  Resp: 18 18  Temp: 97.6 F (36.4 C) 98 F (36.7 C)  SpO2: 98% 97%   Vitals:   02/02/20 1550 02/02/20 1920 02/03/20 0458 02/03/20 0725  BP: (!) 143/62 (!) 167/66 (!) 144/76 (!) 141/68  Pulse: 65 80 77 70  Resp: 19 20 18 18   Temp: (!) 97.4 F (36.3 C) (!) 97.4 F (36.3 C) 97.6 F (36.4 C) 98 F (36.7 C)  TempSrc: Oral Oral Oral Oral  SpO2: 97% 96% 98% 97%  Weight:   76.7 kg   Height:        General: Pt is alert, awake, not in acute distress Cardiovascular: RRR, S1/S2 +, no rubs, no gallops Respiratory: CTA bilaterally, no wheezing, no rhonchi Abdominal: Soft, NT, ND, bowel sounds + Extremities: no edema, no cyanosis    The results of significant diagnostics from this hospitalization (including imaging, microbiology, ancillary and laboratory) are listed below for reference.     Microbiology: Recent Results (from the past 240 hour(s))  Respiratory Panel by RT PCR (Flu A&B, Covid) - Nasopharyngeal Swab     Status: None   Collection Time: 02/01/20  4:14 AM   Specimen: Nasopharyngeal Swab  Result Value Ref Range Status   SARS Coronavirus 2 by RT PCR NEGATIVE NEGATIVE Final    Comment: (NOTE) SARS-CoV-2 target nucleic acids are NOT DETECTED. The SARS-CoV-2 RNA is generally detectable in upper respiratoy specimens during the acute phase of infection. The lowest concentration of SARS-CoV-2 viral copies this assay can detect is 131 copies/mL. A negative result does not preclude  SARS-Cov-2 infection and should not be used as the sole basis for treatment or other patient management decisions. A negative result may occur with  improper specimen collection/handling, submission of specimen other than nasopharyngeal swab, presence of viral mutation(s) within the areas targeted by this assay, and inadequate number of viral copies (<131 copies/mL). A negative result must be combined with clinical observations, patient history, and epidemiological information. The expected result is Negative. Fact Sheet for Patients:  PinkCheek.be Fact Sheet for Healthcare Providers:  GravelBags.it This test is not yet ap proved or cleared by the [place] FDA and  has been authorized for detection and/or diagnosis of SARS-CoV-2 by FDA under an Emergency Use Authorization (EUA). This EUA will remain  in effect (meaning this test can be used) for the duration of the COVID-19 declaration under Section 564(b)(1) of the Act, 21 U.S.C. section 360bbb-3(b)(1), unless the authorization is terminated or revoked sooner.    Influenza A by PCR NEGATIVE NEGATIVE Final   Influenza  B by PCR NEGATIVE NEGATIVE Final    Comment: (NOTE) The Xpert Xpress SARS-CoV-2/FLU/RSV assay is intended as an aid in  the diagnosis of influenza from Nasopharyngeal swab specimens and  should not be used as a sole basis for treatment. Nasal washings and  aspirates are unacceptable for Xpert Xpress SARS-CoV-2/FLU/RSV  testing. Fact Sheet for Patients: PinkCheek.be Fact Sheet for Healthcare Providers: GravelBags.it This test is not yet approved or cleared by the [place] FDA and  has been authorized for detection and/or diagnosis of SARS-CoV-2 by  FDA under an Emergency Use Authorization (EUA). This EUA will remain  in effect (meaning this test can be used) for the duration of the  Covid-19  declaration under Section 564(b)(1) of the Act, 21  U.S.C. section 360bbb-3(b)(1), unless the authorization is  terminated or revoked. Performed at [facility], [address] [place], [place] [address]      Labs: BNP (last 3 results) Recent Labs    02/01/20 0445 02/02/20 0559  BNP 190.0* [id number]   Basic Metabolic Panel: Recent Labs  Lab 02/01/20 0515 02/02/20 0553 02/03/20 0351  NA 139 141 139  K 3.7 3.5 3.5  CL 105 102 99  CO2 19* 29 30  GLUCOSE 307* 193* 288*  BUN 29* 24* 30*  CREATININE 1.48* 1.38* 1.64*  CALCIUM 8.8* 9.3 9.2  PHOS  --  4.2  --    Liver Function Tests: Recent Labs  Lab 02/01/20 0515  AST 19  ALT 13  ALKPHOS 46  BILITOT 0.7  PROT 6.9  ALBUMIN 3.6   No results for input(s): LIPASE, AMYLASE in the last 168 hours. No results for input(s): AMMONIA in the last 168 hours. CBC: Recent Labs  Lab 02/01/20 0445  WBC 13.5*  NEUTROABS 10.0*  HGB 9.9*  HCT 30.9*  MCV 81.3  PLT 362   Cardiac Enzymes: No results for input(s): CKTOTAL, CKMB, CKMBINDEX, TROPONINI in the last 168 hours. BNP: Invalid input(s): POCBNP CBG: Recent Labs  Lab 02/02/20 0737 02/02/20 1128 02/02/20 1623 02/02/20 2104 02/03/20 0730  GLUCAP 172* 340* 221* 272* 259*   D-Dimer No results for input(s): DDIMER in the last 72 hours. Hgb A1c Recent Labs    02/01/20 0333  HGBA1C 8.4*   Lipid Profile No results for input(s): CHOL, HDL, LDLCALC, TRIG, CHOLHDL, LDLDIRECT in the last 72 hours. Thyroid function studies No results for input(s): TSH, T4TOTAL, T3FREE, THYROIDAB in the last 72 hours.  Invalid input(s): FREET3 Anemia work up Recent Labs    02/02/20 0553  VITAMINB12 183  FOLATE 48.3  FERRITIN 26  TIBC 335  IRON 48   Urinalysis    Component Value Date/Time   COLORURINE COLORLESS (A) 02/01/2020 0415   APPEARANCEUR CLEAR (A) 02/01/2020 0415   LABSPEC 1.010 02/01/2020 0415   PHURINE 6.0 02/01/2020 0415   GLUCOSEU 50 (A) 02/01/2020 0415    HGBUR NEGATIVE 02/01/2020 0415   [place] NEGATIVE 02/01/2020 0415   KETONESUR NEGATIVE 02/01/2020 0415   PROTEINUR 100 (A) 02/01/2020 0415   NITRITE NEGATIVE 02/01/2020 0415   LEUKOCYTESUR NEGATIVE 02/01/2020 0415   Sepsis Labs Invalid input(s): PROCALCITONIN,  WBC,  LACTICIDVEN Microbiology Recent Results (from the past 240 hour(s))  Respiratory Panel by RT PCR (Flu A&B, Covid) - Nasopharyngeal Swab     Status: None   Collection Time: 02/01/20  4:14 AM   Specimen: Nasopharyngeal Swab  Result Value Ref Range Status   SARS Coronavirus 2 by RT PCR NEGATIVE NEGATIVE Final    Comment: (  NOTE) SARS-CoV-2 target nucleic acids are NOT DETECTED. The SARS-CoV-2 RNA is generally detectable in upper respiratoy specimens during the acute phase of infection. The lowest concentration of SARS-CoV-2 viral copies this assay can detect is 131 copies/mL. A negative result does not preclude SARS-Cov-2 infection and should not be used as the sole basis for treatment or other patient management decisions. A negative result may occur with  improper specimen collection/handling, submission of specimen other than nasopharyngeal swab, presence of viral mutation(s) within the areas targeted by this assay, and inadequate number of viral copies (<131 copies/mL). A negative result must be combined with clinical observations, patient history, and epidemiological information. The expected result is Negative. Fact Sheet for Patients:  PinkCheek.be Fact Sheet for Healthcare Providers:  GravelBags.it This test is not yet ap proved or cleared by the [place] FDA and  has been authorized for detection and/or diagnosis of SARS-CoV-2 by FDA under an Emergency Use Authorization (EUA). This EUA will remain  in effect (meaning this test can be used) for the duration of the COVID-19 declaration under Section 564(b)(1) of the Act, 21 U.S.C. section  360bbb-3(b)(1), unless the authorization is terminated or revoked sooner.    Influenza A by PCR NEGATIVE NEGATIVE Final   Influenza B by PCR NEGATIVE NEGATIVE Final    Comment: (NOTE) The Xpert Xpress SARS-CoV-2/FLU/RSV assay is intended as an aid in  the diagnosis of influenza from Nasopharyngeal swab specimens and  should not be used as a sole basis for treatment. Nasal washings and  aspirates are unacceptable for Xpert Xpress SARS-CoV-2/FLU/RSV  testing. Fact Sheet for Patients: PinkCheek.be Fact Sheet for Healthcare Providers: GravelBags.it This test is not yet approved or cleared by the [place] FDA and  has been authorized for detection and/or diagnosis of SARS-CoV-2 by  FDA under an Emergency Use Authorization (EUA). This EUA will remain  in effect (meaning this test can be used) for the duration of the  Covid-19 declaration under Section 564(b)(1) of the Act, 21  U.S.C. section 360bbb-3(b)(1), unless the authorization is  terminated or revoked. Performed at [facility], [address] [place], [place] [address]      Time coordinating discharge: Over 30 minutes  SIGNED:   Sidney Ace, MD  [facility] 02/03/2020, 1:47 PM Pager   If 7PM-7AM, please contact night-coverage

## 2020-02-03 NOTE — Progress Notes (Signed)
Patient Name: Brittney [name] Date of Encounter: 02/03/2020  Hospital Problem List     Principal Problem:   Acute exacerbation of CHF (congestive heart failure) (HCC) Active Problems:   Benign essential HTN   DM (diabetes mellitus) ([place])   Elevated troponin    Patient Profile     79 y.o.femalewith history ofdiabetes mellitus, hyperlipidemia, hypertension as well as COPD presented to the emergency room with complaints of acute shortness of breath. She was noted to havesob and bilateral lower extremety edema.Chest x-ray suggestedearlypulmonary edema. Mild troponin elevation. EKG showed sinus tachycardia with no ischemia.Echo1/7/2020revealed mildly reduced LV functionwith ef of 45-50%. No significant valvular abnormalities. Cardiac cath 12/2018 revealed ef 65% with moderate to severe disease in the proximial and distal lad with mid to distal rca stenosis felt to be a candidate for medical therapy due to distal disease  Subjective   Doing well this morning.  Denies shortness of breath.  Creatinine somewhat up from baseline with mild rise in BNP however asymptomatic.  Inpatient Medications    . amLODipine  10 mg Oral Daily  . aspirin EC  81 mg Oral Daily  . atorvastatin  40 mg Oral q1800  . fenofibrate  54 mg Oral Daily  . furosemide  40 mg Oral BID  . heparin  5,000 Units Subcutaneous Q8H  . hydrALAZINE  25 mg Oral TID  . insulin aspart  0-9 Units Subcutaneous TID WC  . insulin glargine  16 Units Subcutaneous QHS  . isosorbide mononitrate  30 mg Oral Daily  . loratadine  10 mg Oral Daily  . metoprolol tartrate  50 mg Oral BID  . multivitamin with minerals  1 tablet Oral Daily  . pantoprazole  40 mg Oral BID  . potassium chloride  10 mEq Oral Daily  . sodium chloride flush  3 mL Intravenous Q12H    Vital Signs    Vitals:   02/02/20 1550 02/02/20 1920 02/03/20 0458 02/03/20 0725  BP: (!) 143/62 (!) 167/66 (!) 144/76 (!) 141/68  Pulse: 65 80 77 70  Resp: 19  20 18 18   Temp: (!) 97.4 F (36.3 C) (!) 97.4 F (36.3 C) 97.6 F (36.4 C) 98 F (36.7 C)  TempSrc: Oral Oral Oral Oral  SpO2: 97% 96% 98% 97%  Weight:   76.7 kg   Height:        Intake/Output Summary (Last 24 hours) at 02/03/2020 0954 Last data filed at 02/03/2020 0612 Gross per 24 hour  Intake --  Output 2000 ml  Net -2000 ml   Filed Weights   02/01/20 1300 02/02/20 0310 02/03/20 0458  Weight: 78.3 kg 77.3 kg 76.7 kg    Physical Exam    GEN: Well nourished, well developed, in no acute distress.  HEENT: normal.  Neck: Supple, no JVD, carotid bruits, or masses. Cardiac: RRR, no murmurs, rubs, or gallops. No clubbing, cyanosis, edema.  Radials/DP/PT 2+ and equal bilaterally.  Respiratory:  Respirations regular and unlabored, clear to auscultation bilaterally. GI: Soft, nontender, nondistended, BS + x 4. MS: no deformity or atrophy. Skin: warm and dry, no rash. Neuro:  Strength and sensation are intact. Psych: Normal affect.  Labs    CBC Recent Labs    02/01/20 0445  WBC 13.5*  NEUTROABS 10.0*  HGB 9.9*  HCT 30.9*  MCV 81.3  PLT [id number]   Basic Metabolic Panel Recent Labs    02/02/20 0553 02/03/20 0351  NA 141 139  K 3.5 3.5  CL 102 99  CO2 29 30  GLUCOSE 193* 288*  BUN 24* 30*  CREATININE 1.38* 1.64*  CALCIUM 9.3 9.2  PHOS 4.2  --    Liver Function Tests Recent Labs    02/01/20 0515  AST 19  ALT 13  ALKPHOS 46  BILITOT 0.7  PROT 6.9  ALBUMIN 3.6   No results for input(s): LIPASE, AMYLASE in the last 72 hours. Cardiac Enzymes No results for input(s): CKTOTAL, CKMB, CKMBINDEX, TROPONINI in the last 72 hours. BNP Recent Labs    02/01/20 0445 02/02/20 0559  BNP 190.0* 247.0*   D-Dimer No results for input(s): DDIMER in the last 72 hours. Hemoglobin A1C Recent Labs    02/01/20 0333  HGBA1C 8.4*   Fasting Lipid Panel No results for input(s): CHOL, HDL, LDLCALC, TRIG, CHOLHDL, LDLDIRECT in the last 72 hours. Thyroid Function Tests No  results for input(s): TSH, T4TOTAL, T3FREE, THYROIDAB in the last 72 hours.  Invalid input(s): FREET3  Telemetry    Normal sinus rhythm  ECG    Normal sinus rhythm with no ischemia  Radiology    US RENAL  Result Date: 02/01/2020 CLINICAL DATA:  Acute kidney injury EXAM: RENAL / URINARY TRACT ULTRASOUND COMPLETE COMPARISON:  Renal ultrasound August 06, 2018, CT abdomen pelvis 06/24/2019 FINDINGS: Right Kidney: Renal measurements: 10.8 x 4.4 x 4.6 cm the = volume: 1 4 mL . Echogenicity within normal limits. No mass or hydronephrosis visualized. Left Kidney: Renal measurements: 12.3 x 4.6 x 4.1 cm = volume: 124 mL. Echogenicity within normal limits. No mass or hydronephrosis visualized. Bladder: Partially decompressed at the time of exam. Appears normal for degree of bladder distention. Other: None. IMPRESSION: Unremarkable renal ultrasound. Renal cysts seen on comparison CT are not well visualized on this exam. Electronically Signed   By: Lovena Le M.D.   On: 02/01/2020 19:46   DG Chest Port 1 View  Result Date: 02/01/2020 CLINICAL DATA:  Dyspnea EXAM: PORTABLE CHEST 1 VIEW COMPARISON:  06/24/2019 FINDINGS: Cardiac shadow is stable. Aortic calcifications are again seen. Mild vascular congestion is noted with very mild interstitial edema. No sizable effusion is noted. No focal infiltrate is seen. No bony abnormality is noted. IMPRESSION: Early CHF. Electronically Signed   By: Inez Catalina M.D.   On: 02/01/2020 03:57    Assessment & Plan    -year-old female with history of diabetes, hyperlipidemia, hypertension as well as history of HFpEF with most recent echo done in 2020 in January showing an EF 45 to 50% with a cath in January 2020 felt to show a normal ejection fraction. She had moderate to severe distal disease felt to be a candidate for medical management. She now presents with increasing shortness of breath and evidence of early congestive heart failure on chest x-ray. She denies  chest pain. Serum troponin is mildly elevated without EKG changes and chest pain is suggestive of demand ischemia. She is hemodynamically stable feeling well states she has been compliant with her medications.  1.  Coronary artery disease               Status post cardiac catheterization in January of last year showing diffuse distal disease felt to be a candidate for medical management.  Mild troponin elevation.  No chest pain or obvious ischemia.  Elevated troponin consistent with demand given her known anatomy.    Appears stable for discharge today.  We will continue with medical management including aspirin, amlodipine 10 mg daily, Imdur 30 mg daily, metoprolol tartrate 50 mg  twice daily.    No invasive evaluation indicated at present.  2.  HFpEF              We will recheck BNP today.  Has diuresed approximately 4 L.  No significant pulmonary edema edema peripherally.  Will continue with Lasix at 40 mg twice daily with early follow-up as an outpatient. Patient appears to be nearing euvolemia.    Continue with low-sodium diet  3.  Acute kidney injury              Appreciate nephrology evaluation.    Follow-up with nephrology as outpatient as needed. 4.  Hypertension              Continue with furosemide at 40 mg twice daily along  with home regimen of amlodipine, hydralazine, isosorbide otherwise.  5.  Diabetes mellitus with chronic kidney disease            Okay for discharge from cardiac standpoint.  Signed, Javier Docker Kazaria Gaertner MD 02/03/2020, 9:54 AM  Pager: [phone]

## 2020-02-04 LAB — PROTEIN ELECTROPHORESIS, SERUM
A/G Ratio: 1 (ref 0.7–1.7)
Albumin ELP: 3.2 g/dL (ref 2.9–4.4)
Alpha-1-Globulin: 0.2 g/dL (ref 0.0–0.4)
Alpha-2-Globulin: 1.1 g/dL — ABNORMAL HIGH (ref 0.4–1.0)
Beta Globulin: 1 g/dL (ref 0.7–1.3)
Gamma Globulin: 0.9 g/dL (ref 0.4–1.8)
Globulin, Total: 3.2 g/dL (ref 2.2–3.9)
Total Protein ELP: 6.4 g/dL (ref 6.0–8.5)

## 2020-02-04 LAB — PROTEIN ELECTRO, RANDOM URINE
Albumin ELP, Urine: 67.9 %
Alpha-1-Globulin, U: 6 %
Alpha-2-Globulin, U: 5.3 %
Beta Globulin, U: 11.2 %
Gamma Globulin, U: 9.7 %
Total Protein, Urine: 219.6 mg/dL

## 2020-02-06 ENCOUNTER — Other Ambulatory Visit: Payer: Self-pay

## 2020-02-06 ENCOUNTER — Encounter: Payer: Self-pay | Admitting: Family

## 2020-02-06 ENCOUNTER — Ambulatory Visit: Payer: Medicare HMO | Attending: Family | Admitting: Family

## 2020-02-06 VITALS — BP 162/55 | HR 89 | Resp 18 | Ht 67.0 in | Wt 166.8 lb

## 2020-02-06 DIAGNOSIS — E119 Type 2 diabetes mellitus without complications: Secondary | ICD-10-CM | POA: Diagnosis present

## 2020-02-06 DIAGNOSIS — I10 Essential (primary) hypertension: Secondary | ICD-10-CM

## 2020-02-06 DIAGNOSIS — Z79899 Other long term (current) drug therapy: Secondary | ICD-10-CM | POA: Diagnosis not present

## 2020-02-06 DIAGNOSIS — I130 Hypertensive heart and chronic kidney disease with heart failure and stage 1 through stage 4 chronic kidney disease, or unspecified chronic kidney disease: Secondary | ICD-10-CM | POA: Diagnosis not present

## 2020-02-06 DIAGNOSIS — Z7982 Long term (current) use of aspirin: Secondary | ICD-10-CM | POA: Insufficient documentation

## 2020-02-06 DIAGNOSIS — E785 Hyperlipidemia, unspecified: Secondary | ICD-10-CM | POA: Insufficient documentation

## 2020-02-06 DIAGNOSIS — Z87891 Personal history of nicotine dependence: Secondary | ICD-10-CM | POA: Diagnosis not present

## 2020-02-06 DIAGNOSIS — Z794 Long term (current) use of insulin: Secondary | ICD-10-CM | POA: Diagnosis not present

## 2020-02-06 DIAGNOSIS — E1122 Type 2 diabetes mellitus with diabetic chronic kidney disease: Secondary | ICD-10-CM | POA: Diagnosis not present

## 2020-02-06 DIAGNOSIS — I5032 Chronic diastolic (congestive) heart failure: Secondary | ICD-10-CM | POA: Insufficient documentation

## 2020-02-06 DIAGNOSIS — N189 Chronic kidney disease, unspecified: Secondary | ICD-10-CM | POA: Insufficient documentation

## 2020-02-06 DIAGNOSIS — K219 Gastro-esophageal reflux disease without esophagitis: Secondary | ICD-10-CM | POA: Insufficient documentation

## 2020-02-06 LAB — GLUCOSE, CAPILLARY: Glucose-Capillary: 364 mg/dL — ABNORMAL HIGH (ref 70–99)

## 2020-02-06 LAB — KAPPA/LAMBDA LIGHT CHAINS
Kappa free light chain: 55.3 mg/L — ABNORMAL HIGH (ref 3.3–19.4)
Kappa, lambda light chain ratio: 1.89 — ABNORMAL HIGH (ref 0.26–1.65)
Lambda free light chains: 29.3 mg/L — ABNORMAL HIGH (ref 5.7–26.3)

## 2020-02-06 NOTE — Patient Instructions (Addendum)
Resume weighing daily and call for an overnight weight gain of > 2 pounds or a weekly weight gain of >5 pounds. 

## 2020-02-06 NOTE — Progress Notes (Signed)
Patient ID: Brittney [name], female    DOB: [birth year], 79 y.o.   MRN: [id number]  HPI  Ms Brittney [name] is a 79 y/o female with a history of DM, hyperlipidemia, HTN, CKD, GERD, vitamin D deficiency, previous tobacco use and chronic heart failure.   Echo report from 01/02/2019 reviewed and showed an EF of 45-50%.  Catheterization done 01/23/2019: EF >65%  Mid RCA lesion is 99% stenosed.  Prox RCA to Mid RCA lesion is 25% stenosed.  Prox LAD lesion is 70% stenosed.  Mid LAD lesion is 70% stenosed.  Ost Cx to Mid Cx lesion is 15% stenosed.  The left ventricular systolic function is normal.  LV end diastolic pressure is normal.  The left ventricular ejection fraction is greater than 65% by visual estimate.  Admitted 02/01/20 due to acute on chronic HF. Cardiology and nephrology consults obtained. Initially given IV lasix and then transitioned to oral diuretics with change to BID dosing (was daily). Lost ~ 4L during admission. Renal ultrasound unrevealing. Losartan held due to renal function.  Discharged after 2 days.    She presents today for a follow-up visit with a chief complaint of moderate shortness of breath upon minimal exertion. She describes this as chronic in nature having been present for several years. She has associated fatigue, light-headedness and occasional pedal edema. She denies any difficulty sleeping (usually), abdominal distention, palpitations, chest pain or cough.   Hasn't been weighing herself daily. Normally drinks a very large cup of coffee along with 2 sugar free sodas daily and was wondering if that was too much caffeine.   Past Medical History:  Diagnosis Date  . Aortic valve stenosis   . Carotid bruit   . CHF (congestive heart failure) ([place])   . Chronic kidney disease   . Diabetes mellitus without complication ([place])   . GERD (gastroesophageal reflux disease)   . Heart murmur   . Hyperlipidemia   . Hypertension   . Pneumonia   . Vitamin D deficiency     Past Surgical History:  Procedure Laterality Date  . COLONOSCOPY    . COLONOSCOPY WITH PROPOFOL N/A 08/02/2019   Procedure: COLONOSCOPY WITH PROPOFOL;  Surgeon: Virgel Manifold, MD;  Location: [facility] ENDOSCOPY;  Service: Endoscopy;  Laterality: N/A;  . CORONARY ANGIOPLASTY    . ESOPHAGOGASTRODUODENOSCOPY N/A 06/26/2019   Procedure: ESOPHAGOGASTRODUODENOSCOPY (EGD);  Surgeon: Virgel Manifold, MD;  Location: [facility] ENDOSCOPY;  Service: Endoscopy;  Laterality: N/A;  . ESOPHAGOGASTRODUODENOSCOPY (EGD) WITH PROPOFOL N/A 08/02/2019   Procedure: ESOPHAGOGASTRODUODENOSCOPY (EGD) WITH PROPOFOL;  Surgeon: Virgel Manifold, MD;  Location: [facility] ENDOSCOPY;  Service: Endoscopy;  Laterality: N/A;  . LEFT HEART CATH AND CORONARY ANGIOGRAPHY N/A 01/23/2019   Procedure: LEFT HEART CATH AND CORONARY ANGIOGRAPHY;  Surgeon: Yolonda Kida, MD;  Location: [facility];  Service: Cardiovascular;  Laterality: N/A;  . ovarian cyst removal     Family History  Problem Relation Age of Onset  . Diabetes Neg Hx   . Hypertension Neg Hx   . Breast cancer Neg Hx    Social History   Tobacco Use  . Smoking status: Former Smoker    Packs/day: 1.00    Years: 40.00    Pack years: 40.00    Types: Cigarettes    Quit date: 07/27/2018    Years since quitting: 1.5  . Smokeless tobacco: Never Used  Substance Use Topics  . Alcohol use: No   Allergies  Allergen Reactions  . Accupril [Quinapril Hcl]  Prior to Admission medications   Medication Sig Start Date End Date Taking? Authorizing Provider  acetaminophen (TYLENOL) 325 MG tablet Take 2 tablets (650 mg total) by mouth every 6 (six) hours as needed for mild pain (or Fever >/= 101). 06/26/19  Yes Gouru, Illene Silver, MD  ALBUTEROL IN Inhale 90 mcg into the lungs every 6 (six) hours as needed (wheezing).   Yes [provider]  amLODipine (NORVASC) 10 MG tablet Take 10 mg by mouth daily.   Yes [provider]  aspirin EC 81 MG EC tablet  Take 1 tablet (81 mg total) by mouth daily. 06/27/19  Yes Gouru, Illene Silver, MD  atorvastatin (LIPITOR) 40 MG tablet Take 1 tablet by mouth daily. 07/04/18  Yes [provider]  cetirizine (ZYRTEC) 10 MG tablet Take 10 mg by mouth daily.    Yes [provider]  Choline Fenofibrate 45 MG capsule Take 90 mg by mouth daily.   Yes [provider]  furosemide (LASIX) 40 MG tablet Take 1 tablet (40 mg total) by mouth 2 (two) times daily. 02/03/20 03/04/20 Yes Sreenath, Sudheer B, MD  glipiZIDE (GLUCOTROL XL) 10 MG 24 hr tablet Take 10 mg by mouth 2 (two) times daily.   Yes [provider]  hydrALAZINE (APRESOLINE) 25 MG tablet Take 25 mg by mouth 3 (three) times daily.    Yes [provider]  isosorbide mononitrate (IMDUR) 30 MG 24 hr tablet Take 30 mg by mouth daily.   Yes [provider]  LANTUS SOLOSTAR 100 UNIT/ML Solostar Pen Inject 16 Units into the skin at bedtime. 10/19/19  Yes [provider]  metFORMIN (GLUCOPHAGE) 1000 MG tablet Take 1,000 mg by mouth 2 (two) times daily with a meal. Just started back. Not sure how many mg   Yes [provider]  metoprolol tartrate (LOPRESSOR) 50 MG tablet Take 1 tablet (50 mg total) by mouth 2 (two) times daily. 02/03/20 03/04/20 Yes Sreenath, Sudheer B, MD  Multiple Vitamin (MULTIVITAMIN WITH MINERALS) TABS tablet Take 1 tablet by mouth daily.   Yes [provider]  pantoprazole (PROTONIX) 40 MG tablet Take 1 tablet (40 mg total) by mouth 2 (two) times daily. 06/26/19  Yes Gouru, Illene Silver, MD  potassium chloride 20 MEQ TBCR Take 20 mEq by mouth daily. 02/03/20 03/04/20 Yes Sreenath, Trula Slade, MD  saccharomyces boulardii (FLORASTOR) 250 MG capsule Take 250 mg by mouth 2 (two) times daily.   Yes [provider]  sitaGLIPtin (JANUVIA) 100 MG tablet Take 100 mg by mouth daily.   Yes [provider]     Review of Systems  Constitutional: Positive for fatigue. Negative for appetite  change.  HENT: Positive for sneezing. Negative for congestion and postnasal drip.   Eyes: Negative.   Respiratory: Positive for shortness of breath. Negative for cough.   Cardiovascular: Positive for leg swelling (at times). Negative for chest pain and palpitations.  Gastrointestinal: Negative for abdominal distention and abdominal pain.  Endocrine: Negative.   Genitourinary: Negative.   Musculoskeletal: Negative for back pain.  Skin: Negative.   Allergic/Immunologic: Negative.   Neurological: Positive for light-headedness. Negative for dizziness.  Hematological: Negative for adenopathy. Does not bruise/bleed easily.  Psychiatric/Behavioral: Negative for dysphoric mood and sleep disturbance (sleeping on 1 pillow). The patient is not nervous/anxious.    Vitals:   02/06/20 0957  BP: (!) 162/55  Pulse: 89  Resp: 18  SpO2: 100%  Weight: 166 lb 12.8 oz (75.7 kg)  Height: 5\' 7"  (1.702 m)  Wt Readings from Last 3 Encounters:  02/06/20 166 lb 12.8 oz (75.7 kg)  02/03/20 169 lb (76.7 kg)  08/02/19 170 lb (77.1 kg)   Lab Results  Component Value Date   CREATININE 1.64 (H) 02/03/2020   CREATININE 1.38 (H) 02/02/2020   CREATININE 1.48 (H) 02/01/2020     Physical Exam Vitals and nursing note reviewed.  Constitutional:      Appearance: Normal appearance.  HENT:     Head: Normocephalic and atraumatic.  Cardiovascular:     Rate and Rhythm: Normal rate and regular rhythm.  Pulmonary:     Effort: Pulmonary effort is normal.     Breath sounds: No wheezing or rales.  Abdominal:     General: There is no distension.     Palpations: Abdomen is soft.  Musculoskeletal:        General: No tenderness.     Cervical back: Normal range of motion and neck supple.     Right lower leg: Edema (trace pitting) present.     Left lower leg: No edema.  Skin:    General: Skin is warm and dry.  Neurological:     General: No focal deficit present.     Mental Status: She is alert and oriented to  person, place, and time.  Psychiatric:        Mood and Affect: Mood normal.        Behavior: Behavior normal.        Thought Content: Thought content normal.   Assessment & Plan:  1: Chronic heart failure with preserved ejection fraction- - NYHA class II - euvolemic today - not weighing daily; instructed to resume weighing daily so that she can call for an overnight weight gain of >2 pounds or a weekly weight gain of >5 pounds - weight down 2 pounds from last visit here 11 months ago - not adding salt to her food and continues to follow a low sodium diet - saw cardiology Clayborn Bigness) 11/05/2019 - BNP 02/02/20 was 247.0 - encouraged her to decrease her caffeine consumption and change out her coffee by going 1/2 and 1/2 and then switch to decaf completely - patient says that she's received her flu vaccine and her first COVID vaccine  2: HTN- - BP elevated but she hasn't taken her medications yet today; emphasized that she needed to take her medications prior to coming to her visits so that we can assess the effectiveness of the medication - she says that she'll take her medications once she returns home - follows with PCP Frederico Hamman) at [place] from 02/03/20 reviewed and showed sodium 139, potassium 3.5, creatinine 1.64 and GFR 34  3: Diabetes- - glucose at home this morning was 364; says that she had creamer in her coffee; will be going home and taking her medications - A1c 02/01/20 was 8.4%  Patient did not bring her medications nor a list. Each medication was verbally reviewed with the patient and she was encouraged to bring the bottles to every visit to confirm accuracy of list.  Return in 2 months or sooner for any questions/problems before then.

## 2020-02-26 DIAGNOSIS — N1831 Chronic kidney disease, stage 3a: Secondary | ICD-10-CM | POA: Insufficient documentation

## 2020-02-26 DIAGNOSIS — D631 Anemia in chronic kidney disease: Secondary | ICD-10-CM | POA: Insufficient documentation

## 2020-02-26 DIAGNOSIS — I509 Heart failure, unspecified: Secondary | ICD-10-CM | POA: Insufficient documentation

## 2020-02-26 DIAGNOSIS — N189 Chronic kidney disease, unspecified: Secondary | ICD-10-CM | POA: Insufficient documentation

## 2020-02-26 DIAGNOSIS — I129 Hypertensive chronic kidney disease with stage 1 through stage 4 chronic kidney disease, or unspecified chronic kidney disease: Secondary | ICD-10-CM | POA: Insufficient documentation

## 2020-02-26 DIAGNOSIS — N2581 Secondary hyperparathyroidism of renal origin: Secondary | ICD-10-CM | POA: Insufficient documentation

## 2020-02-26 DIAGNOSIS — E876 Hypokalemia: Secondary | ICD-10-CM | POA: Insufficient documentation

## 2020-03-18 ENCOUNTER — Encounter: Payer: Self-pay | Admitting: Emergency Medicine

## 2020-03-18 ENCOUNTER — Emergency Department: Payer: Medicare HMO

## 2020-03-18 ENCOUNTER — Other Ambulatory Visit: Payer: Self-pay

## 2020-03-18 DIAGNOSIS — N179 Acute kidney failure, unspecified: Secondary | ICD-10-CM | POA: Diagnosis not present

## 2020-03-18 DIAGNOSIS — R7989 Other specified abnormal findings of blood chemistry: Secondary | ICD-10-CM | POA: Diagnosis not present

## 2020-03-18 DIAGNOSIS — I130 Hypertensive heart and chronic kidney disease with heart failure and stage 1 through stage 4 chronic kidney disease, or unspecified chronic kidney disease: Secondary | ICD-10-CM | POA: Diagnosis not present

## 2020-03-18 DIAGNOSIS — N1831 Chronic kidney disease, stage 3a: Secondary | ICD-10-CM | POA: Insufficient documentation

## 2020-03-18 DIAGNOSIS — Z794 Long term (current) use of insulin: Secondary | ICD-10-CM | POA: Insufficient documentation

## 2020-03-18 DIAGNOSIS — E1122 Type 2 diabetes mellitus with diabetic chronic kidney disease: Secondary | ICD-10-CM | POA: Insufficient documentation

## 2020-03-18 DIAGNOSIS — E785 Hyperlipidemia, unspecified: Secondary | ICD-10-CM | POA: Diagnosis not present

## 2020-03-18 DIAGNOSIS — D72829 Elevated white blood cell count, unspecified: Secondary | ICD-10-CM | POA: Insufficient documentation

## 2020-03-18 DIAGNOSIS — Z79899 Other long term (current) drug therapy: Secondary | ICD-10-CM | POA: Diagnosis not present

## 2020-03-18 DIAGNOSIS — I5042 Chronic combined systolic (congestive) and diastolic (congestive) heart failure: Secondary | ICD-10-CM | POA: Diagnosis not present

## 2020-03-18 DIAGNOSIS — I25118 Atherosclerotic heart disease of native coronary artery with other forms of angina pectoris: Principal | ICD-10-CM | POA: Insufficient documentation

## 2020-03-18 DIAGNOSIS — Z7982 Long term (current) use of aspirin: Secondary | ICD-10-CM | POA: Diagnosis not present

## 2020-03-18 DIAGNOSIS — I700 Atherosclerosis of aorta: Secondary | ICD-10-CM | POA: Insufficient documentation

## 2020-03-18 DIAGNOSIS — I350 Nonrheumatic aortic (valve) stenosis: Secondary | ICD-10-CM | POA: Insufficient documentation

## 2020-03-18 DIAGNOSIS — Z20822 Contact with and (suspected) exposure to covid-19: Secondary | ICD-10-CM | POA: Insufficient documentation

## 2020-03-18 DIAGNOSIS — R079 Chest pain, unspecified: Secondary | ICD-10-CM | POA: Diagnosis present

## 2020-03-18 DIAGNOSIS — K2100 Gastro-esophageal reflux disease with esophagitis, without bleeding: Secondary | ICD-10-CM | POA: Diagnosis not present

## 2020-03-18 LAB — CBC WITH DIFFERENTIAL/PLATELET
Abs Immature Granulocytes: 0.05 10*3/uL (ref 0.00–0.07)
Basophils Absolute: 0.1 10*3/uL (ref 0.0–0.1)
Basophils Relative: 1 %
Eosinophils Absolute: 0.3 10*3/uL (ref 0.0–0.5)
Eosinophils Relative: 2 %
HCT: 31.2 % — ABNORMAL LOW (ref 36.0–46.0)
Hemoglobin: 10.1 g/dL — ABNORMAL LOW (ref 12.0–15.0)
Immature Granulocytes: 0 %
Lymphocytes Relative: 20 %
Lymphs Abs: 2.5 10*3/uL (ref 0.7–4.0)
MCH: 26.5 pg (ref 26.0–34.0)
MCHC: 32.4 g/dL (ref 30.0–36.0)
MCV: 81.9 fL (ref 80.0–100.0)
Monocytes Absolute: 0.9 10*3/uL (ref 0.1–1.0)
Monocytes Relative: 8 %
Neutro Abs: 8.3 10*3/uL — ABNORMAL HIGH (ref 1.7–7.7)
Neutrophils Relative %: 69 %
Platelets: 364 10*3/uL (ref 150–400)
RBC: 3.81 MIL/uL — ABNORMAL LOW (ref 3.87–5.11)
RDW: 15 % (ref 11.5–15.5)
WBC: 12.2 10*3/uL — ABNORMAL HIGH (ref 4.0–10.5)
nRBC: 0 % (ref 0.0–0.2)

## 2020-03-18 LAB — COMPREHENSIVE METABOLIC PANEL
ALT: 13 U/L (ref 0–44)
AST: 16 U/L (ref 15–41)
Albumin: 4.2 g/dL (ref 3.5–5.0)
Alkaline Phosphatase: 42 U/L (ref 38–126)
Anion gap: 16 — ABNORMAL HIGH (ref 5–15)
BUN: 50 mg/dL — ABNORMAL HIGH (ref 8–23)
CO2: 18 mmol/L — ABNORMAL LOW (ref 22–32)
Calcium: 9.2 mg/dL (ref 8.9–10.3)
Chloride: 105 mmol/L (ref 98–111)
Creatinine, Ser: 2.29 mg/dL — ABNORMAL HIGH (ref 0.44–1.00)
GFR calc Af Amer: 23 mL/min — ABNORMAL LOW (ref >60)
GFR calc non Af Amer: 20 mL/min — ABNORMAL LOW (ref >60)
Glucose, Bld: 90 mg/dL (ref 70–99)
Potassium: 5.1 mmol/L (ref 3.5–5.1)
Sodium: 139 mmol/L (ref 135–145)
Total Bilirubin: 0.7 mg/dL (ref 0.3–1.2)
Total Protein: 7.9 g/dL (ref 6.5–8.1)

## 2020-03-18 LAB — TROPONIN I (HIGH SENSITIVITY): Troponin I (High Sensitivity): 11 ng/L (ref <18)

## 2020-03-18 NOTE — ED Triage Notes (Signed)
Pt to triage via w/c with no distress noted, brought in by EMS from home, mask in place; pt reports since about 7pm, having upper CP,nonradiating with no accomp symptoms

## 2020-03-19 ENCOUNTER — Observation Stay
Admit: 2020-03-19 | Discharge: 2020-03-19 | Disposition: A | Discharge status: Home / Self Care | Payer: Medicare HMO | Attending: Internal Medicine | Admitting: Internal Medicine

## 2020-03-19 ENCOUNTER — Observation Stay
Admission: EM | Admit: 2020-03-19 | Discharge: 2020-03-20 | Disposition: A | Discharge status: Home / Self Care | Payer: Medicare HMO | Attending: Internal Medicine | Admitting: Internal Medicine

## 2020-03-19 DIAGNOSIS — K219 Gastro-esophageal reflux disease without esophagitis: Secondary | ICD-10-CM | POA: Diagnosis present

## 2020-03-19 DIAGNOSIS — N179 Acute kidney failure, unspecified: Secondary | ICD-10-CM | POA: Diagnosis present

## 2020-03-19 DIAGNOSIS — I5022 Chronic systolic (congestive) heart failure: Secondary | ICD-10-CM | POA: Diagnosis not present

## 2020-03-19 DIAGNOSIS — R7989 Other specified abnormal findings of blood chemistry: Secondary | ICD-10-CM | POA: Diagnosis present

## 2020-03-19 DIAGNOSIS — I10 Essential (primary) hypertension: Secondary | ICD-10-CM | POA: Diagnosis not present

## 2020-03-19 DIAGNOSIS — I2510 Atherosclerotic heart disease of native coronary artery without angina pectoris: Secondary | ICD-10-CM | POA: Diagnosis present

## 2020-03-19 DIAGNOSIS — D72829 Elevated white blood cell count, unspecified: Secondary | ICD-10-CM | POA: Diagnosis present

## 2020-03-19 DIAGNOSIS — R778 Other specified abnormalities of plasma proteins: Secondary | ICD-10-CM | POA: Diagnosis present

## 2020-03-19 DIAGNOSIS — E1129 Type 2 diabetes mellitus with other diabetic kidney complication: Secondary | ICD-10-CM | POA: Diagnosis present

## 2020-03-19 DIAGNOSIS — I214 Non-ST elevation (NSTEMI) myocardial infarction: Secondary | ICD-10-CM

## 2020-03-19 DIAGNOSIS — R079 Chest pain, unspecified: Secondary | ICD-10-CM | POA: Diagnosis not present

## 2020-03-19 DIAGNOSIS — N1831 Chronic kidney disease, stage 3a: Secondary | ICD-10-CM

## 2020-03-19 LAB — URINALYSIS, COMPLETE (UACMP) WITH MICROSCOPIC
Bacteria, UA: NONE SEEN
Bilirubin Urine: NEGATIVE
Glucose, UA: NEGATIVE mg/dL
Hgb urine dipstick: NEGATIVE
Ketones, ur: NEGATIVE mg/dL
Leukocytes,Ua: NEGATIVE
Nitrite: NEGATIVE
Protein, ur: 100 mg/dL — AB
Specific Gravity, Urine: 1.009 (ref 1.005–1.030)
Squamous Epithelial / HPF: NONE SEEN (ref 0–5)
pH: 5 (ref 5.0–8.0)

## 2020-03-19 LAB — GLUCOSE, CAPILLARY
Glucose-Capillary: 59 mg/dL — ABNORMAL LOW (ref 70–99)
Glucose-Capillary: 118 mg/dL — ABNORMAL HIGH (ref 70–99)
Glucose-Capillary: 154 mg/dL — ABNORMAL HIGH (ref 70–99)
Glucose-Capillary: 261 mg/dL — ABNORMAL HIGH (ref 70–99)

## 2020-03-19 LAB — PROTIME-INR
INR: 0.8 (ref 0.8–1.2)
Prothrombin Time: 11.4 seconds (ref 11.4–15.2)

## 2020-03-19 LAB — TROPONIN I (HIGH SENSITIVITY)
Troponin I (High Sensitivity): 34 ng/L — ABNORMAL HIGH (ref <18)
Troponin I (High Sensitivity): 47 ng/L — ABNORMAL HIGH (ref <18)
Troponin I (High Sensitivity): 38 ng/L — ABNORMAL HIGH (ref <18)
Troponin I (High Sensitivity): 34 ng/L — ABNORMAL HIGH (ref <18)

## 2020-03-19 LAB — SARS CORONAVIRUS 2 (TAT 6-24 HRS): SARS Coronavirus 2: NEGATIVE

## 2020-03-19 LAB — HEPARIN LEVEL (UNFRACTIONATED)
Heparin Unfractionated: 0.56 IU/mL (ref 0.30–0.70)
Heparin Unfractionated: 0.46 IU/mL (ref 0.30–0.70)

## 2020-03-19 LAB — BRAIN NATRIURETIC PEPTIDE: B Natriuretic Peptide: 112 pg/mL — ABNORMAL HIGH (ref 0.0–100.0)

## 2020-03-19 LAB — APTT: aPTT: 29 seconds (ref 24–36)

## 2020-03-19 MED ORDER — HEPARIN (PORCINE) 25000 UT/250ML-% IV SOLN
950.0000 [IU]/h | INTRAVENOUS | Status: DC
  Administered 2020-03-19 – 2020-03-20 (×2): 1000 [IU]/h via INTRAVENOUS
  Filled 2020-03-19 (×2): qty 250

## 2020-03-19 MED ORDER — HYDRALAZINE HCL 25 MG PO TABS
25.0000 mg | ORAL_TABLET | Freq: Three times a day (TID) | ORAL | Status: DC
  Administered 2020-03-19 – 2020-03-20 (×3): 25 mg via ORAL
  Filled 2020-03-19 (×3): qty 1

## 2020-03-19 MED ORDER — LORATADINE 10 MG PO TABS
10.0000 mg | ORAL_TABLET | Freq: Every day | ORAL | Status: DC
  Administered 2020-03-19 – 2020-03-20 (×2): 10 mg via ORAL
  Filled 2020-03-19 (×2): qty 1

## 2020-03-19 MED ORDER — ASPIRIN EC 81 MG PO TBEC
81.0000 mg | DELAYED_RELEASE_TABLET | Freq: Every day | ORAL | Status: DC
  Administered 2020-03-20: 81 mg via ORAL
  Filled 2020-03-19: qty 1

## 2020-03-19 MED ORDER — ACETAMINOPHEN 325 MG PO TABS: 650.0000 mg | ORAL_TABLET | Freq: Four times a day (QID) | ORAL | Status: DC | PRN

## 2020-03-19 MED ORDER — FENOFIBRATE 54 MG PO TABS
54.0000 mg | ORAL_TABLET | Freq: Every day | ORAL | Status: DC
  Administered 2020-03-19 – 2020-03-20 (×2): 54 mg via ORAL
  Filled 2020-03-19 (×2): qty 1

## 2020-03-19 MED ORDER — ALBUTEROL SULFATE HFA 108 (90 BASE) MCG/ACT IN AERS: 2.0000 | INHALATION_SPRAY | RESPIRATORY_TRACT | Status: DC | PRN

## 2020-03-19 MED ORDER — ADULT MULTIVITAMIN W/MINERALS CH
1.0000 | ORAL_TABLET | Freq: Every day | ORAL | Status: DC
  Administered 2020-03-19 – 2020-03-20 (×2): 1 via ORAL
  Filled 2020-03-19 (×2): qty 1

## 2020-03-19 MED ORDER — ONDANSETRON HCL 4 MG/2ML IJ SOLN: 4.0000 mg | Freq: Three times a day (TID) | INTRAMUSCULAR | Status: DC | PRN

## 2020-03-19 MED ORDER — INSULIN ASPART 100 UNIT/ML ~~LOC~~ SOLN
0.0000 [IU] | Freq: Three times a day (TID) | SUBCUTANEOUS | Status: DC
  Administered 2020-03-20 (×2): 2 [IU] via SUBCUTANEOUS
  Filled 2020-03-19 (×2): qty 1

## 2020-03-19 MED ORDER — ATORVASTATIN CALCIUM 20 MG PO TABS
40.0000 mg | ORAL_TABLET | Freq: Every day | ORAL | Status: DC
  Administered 2020-03-19 – 2020-03-20 (×2): 40 mg via ORAL
  Filled 2020-03-19 (×2): qty 2

## 2020-03-19 MED ORDER — ASPIRIN 81 MG PO CHEW
324.0000 mg | CHEWABLE_TABLET | Freq: Once | ORAL | Status: AC
  Administered 2020-03-19: 324 mg via ORAL
  Filled 2020-03-19: qty 4

## 2020-03-19 MED ORDER — AMLODIPINE BESYLATE 10 MG PO TABS
10.0000 mg | ORAL_TABLET | Freq: Every day | ORAL | Status: DC
  Administered 2020-03-19 – 2020-03-20 (×2): 10 mg via ORAL
  Filled 2020-03-19 (×2): qty 1

## 2020-03-19 MED ORDER — METOPROLOL TARTRATE 50 MG PO TABS
50.0000 mg | ORAL_TABLET | Freq: Two times a day (BID) | ORAL | Status: DC
  Administered 2020-03-19 – 2020-03-20 (×3): 50 mg via ORAL
  Filled 2020-03-19 (×3): qty 1

## 2020-03-19 MED ORDER — HEPARIN BOLUS VIA INFUSION
4000.0000 [IU] | Freq: Once | INTRAVENOUS | Status: AC
  Administered 2020-03-19: 4000 [IU] via INTRAVENOUS
  Filled 2020-03-19: qty 4000

## 2020-03-19 MED ORDER — SACCHAROMYCES BOULARDII 250 MG PO CAPS
250.0000 mg | ORAL_CAPSULE | Freq: Two times a day (BID) | ORAL | Status: DC
  Administered 2020-03-19 – 2020-03-20 (×3): 250 mg via ORAL
  Filled 2020-03-19 (×4): qty 1

## 2020-03-19 MED ORDER — INSULIN ASPART 100 UNIT/ML ~~LOC~~ SOLN
0.0000 [IU] | Freq: Every day | SUBCUTANEOUS | Status: DC
  Administered 2020-03-19: 3 [IU] via SUBCUTANEOUS
  Filled 2020-03-19: qty 1

## 2020-03-19 MED ORDER — HYDRALAZINE HCL 25 MG PO TABS: 25.0000 mg | ORAL_TABLET | Freq: Three times a day (TID) | ORAL | Status: DC | PRN

## 2020-03-19 MED ORDER — PANTOPRAZOLE SODIUM 40 MG PO TBEC
40.0000 mg | DELAYED_RELEASE_TABLET | Freq: Two times a day (BID) | ORAL | Status: DC
  Administered 2020-03-19 – 2020-03-20 (×3): 40 mg via ORAL
  Filled 2020-03-19 (×3): qty 1

## 2020-03-19 MED ORDER — ALBUTEROL SULFATE (2.5 MG/3ML) 0.083% IN NEBU: 2.5000 mg | INHALATION_SOLUTION | RESPIRATORY_TRACT | Status: DC | PRN

## 2020-03-19 MED ORDER — NITROGLYCERIN 0.4 MG SL SUBL
0.4000 mg | SUBLINGUAL_TABLET | Freq: Once | SUBLINGUAL | Status: AC
  Administered 2020-03-19: 0.4 mg via SUBLINGUAL
  Filled 2020-03-19: qty 1

## 2020-03-19 MED ORDER — ISOSORBIDE MONONITRATE ER 30 MG PO TB24
30.0000 mg | ORAL_TABLET | Freq: Three times a day (TID) | ORAL | Status: DC
  Administered 2020-03-19 – 2020-03-20 (×3): 30 mg via ORAL
  Filled 2020-03-19 (×3): qty 1

## 2020-03-19 MED ORDER — NITROGLYCERIN 0.4 MG SL SUBL: 0.4000 mg | SUBLINGUAL_TABLET | SUBLINGUAL | Status: DC | PRN

## 2020-03-19 MED ORDER — MORPHINE SULFATE (PF) 2 MG/ML IV SOLN: 1.0000 mg | INTRAVENOUS | Status: DC | PRN

## 2020-03-19 NOTE — ED Notes (Signed)
Pt reports that at approx 1930 she began to experience chest pain that radiated to the left neck. States that she experienced some SOB with pain. Denies any worsening pain or SOB at this time. PT in bed, connected to monitor, call light in reach and blanket provided for comfort.

## 2020-03-19 NOTE — Progress Notes (Signed)
ANTICOAGULATION CONSULT NOTE - Initial Consult  Pharmacy Consult for heparin Indication: chest pain/ACS  Allergies  Allergen Reactions  . Accupril [Quinapril Hcl] Other (See Comments)    Reaction: unknown    Patient Measurements: Height: 5\' 7"  (170.2 cm) Weight: 170 lb 8 oz (77.3 kg) IBW/kg (Calculated) : 61.6 Heparin Dosing Weight: 68 kg  Vital Signs: Temp: 98.6 F (37 C) (03/24 1255) Temp Source: Oral (03/24 1255) BP: 145/74 (03/24 1255) Pulse Rate: 77 (03/24 1255)  Labs: Recent Labs    03/18/20 2056 03/18/20 2056 03/19/20 0057 03/19/20 1009 03/19/20 1028 03/19/20 1304  HGB 10.1*  --   --   --   --   --   HCT 31.2*  --   --   --   --   --   PLT 364  --   --   --   --   --   APTT  --   --  29  --   --   --   LABPROT  --   --  11.4  --   --   --   INR  --   --  0.8  --   --   --   HEPARINUNFRC  --   --   --   --  0.56  --   CREATININE 2.29*  --   --   --   --   --   TROPONINIHS 11   < > 34* 47*  --  38*   < > = values in this interval not displayed.    Estimated Creatinine Clearance: 21.4 mL/min (A) (by C-G formula based on SCr of 2.29 mg/dL (H)).   Medical History: Past Medical History:  Diagnosis Date  . Aortic valve stenosis   . Carotid bruit   . CHF (congestive heart failure) ([address])   . Chronic kidney disease   . Diabetes mellitus without complication ([place])   . GERD (gastroesophageal reflux disease)   . Heart murmur   . Hyperlipidemia   . Hypertension   . Pneumonia   . Vitamin D deficiency     Medications:  Scheduled:  . amLODipine  10 mg Oral Daily  . [START ON 03/20/2020] aspirin EC  81 mg Oral Daily  . atorvastatin  40 mg Oral Daily  . fenofibrate  54 mg Oral Daily  . hydrALAZINE  25 mg Oral TID  . insulin aspart  0-5 Units Subcutaneous QHS  . insulin aspart  0-9 Units Subcutaneous TID WC  . isosorbide mononitrate  30 mg Oral TID  . loratadine  10 mg Oral Daily  . metoprolol tartrate  50 mg Oral BID  . multivitamin with minerals  1  tablet Oral Daily  . pantoprazole  40 mg Oral BID  . saccharomyces boulardii  250 mg Oral BID    Assessment: Patient w/ h/o CKD, CHF w/ cardiomyopathy, DMII, COPD, aortic valve stenosis. Patient arrives x c/o CP non-radiating w/o other symptoms. trops of 11 >> 34, EKG showing normal sinus. Baseline CBC WNL, aPTT/INR pending. No anticoagulation PTA. Being started on heparin drip for management of possible NSTEMI/UA angina.  3/24 Heparin initiated at 1000 units/hr 3/24 10:28 HL 0.56, therapeutic x 1  Goal of Therapy:  Heparin level 0.3-0.7 units/ml Monitor platelets by anticoagulation protocol: Yes   Plan:  HL 0.56 is therapeutic. Will continue with current rate of 1000 units/hr. Will check HL in 8 hours for confirmation. Daily CBC while on Heparin drip.  Paulina Fusi, PharmD,  BCPS 03/19/2020 1:49 PM

## 2020-03-19 NOTE — ED Notes (Signed)
RN called and Spoke to Retail banker for [facility] and was informed that they are unable to receive this patient at this time due to RN staff having max number of patients and not enough RNs to take on more patients. RN has informed Agricultural consultant in ED of delay.

## 2020-03-19 NOTE — ED Provider Notes (Signed)
[facility] Emergency Department Provider Note  ____________________________________________   First MD Initiated Contact with Patient 03/19/20 0038     (approximate)  I have reviewed the triage vital signs and the nursing notes.   HISTORY  Chief Complaint Chest Pain    HPI Brittney [name] is a 79 y.o. female with below list of previous medical conditions including hypertension hyperlipidemia congestive heart failure (EF of 45 to 50%) aortic valve stenosis presents to the emergency department secondary to acute onset of nonradiating upper chest pain that began at 7 PM tonight with no accompanying symptoms.  Patient states that current pain score 6 out of 10.  Most recent cardiac catheterization from 01/23/2019 revealed mid RCA 99% stenosis.  Proximal to mid RCA 25% stenosis LAD proximal and mid with 70% stenosis that was treated medically with Imdur".  Patient was referred to tertiary care center for medical therapy was unsuccessful".     Past Medical History:  Diagnosis Date  . Aortic valve stenosis   . Carotid bruit   . CHF (congestive heart failure) ([place])   . Chronic kidney disease   . Diabetes mellitus without complication ([place])   . GERD (gastroesophageal reflux disease)   . Heart murmur   . Hyperlipidemia   . Hypertension   . Pneumonia   . Vitamin D deficiency     Patient Active Problem List   Diagnosis Date Noted  . Acute exacerbation of CHF (congestive heart failure) ([place]) 02/01/2020  . Elevated troponin 02/01/2020  . Reflux esophagitis   . CLE (columnar lined esophagus)   . Duodenitis 06/25/2019  . Chronic heart failure with preserved ejection fraction (HFpEF) ([place]) 02/26/2019  . Benign essential HTN 01/17/2019  . DM (diabetes mellitus) ([place]) 01/17/2019  . Diabetes mellitus type 2, uncontrolled ([place]) 07/24/2015  . GERD without esophagitis 07/24/2015  . Hyperlipidemia 07/24/2015    Past Surgical History:  Procedure Laterality Date   . COLONOSCOPY    . COLONOSCOPY WITH PROPOFOL N/A 08/02/2019   Procedure: COLONOSCOPY WITH PROPOFOL;  Surgeon: Virgel Manifold, MD;  Location: [facility] ENDOSCOPY;  Service: Endoscopy;  Laterality: N/A;  . CORONARY ANGIOPLASTY    . ESOPHAGOGASTRODUODENOSCOPY N/A 06/26/2019   Procedure: ESOPHAGOGASTRODUODENOSCOPY (EGD);  Surgeon: Virgel Manifold, MD;  Location[facility] ENDOSCOPY;  Service: Endoscopy;  Laterality: N/A;  . ESOPHAGOGASTRODUODENOSCOPY (EGD) WITH PROPOFOL N/A 08/02/2019   Procedure: ESOPHAGOGASTRODUODENOSCOPY (EGD) WITH PROPOFOL;  Surgeon: Virgel Manifold, MD;  Location: [facility] ENDOSCOPY;  Service: Endoscopy;  Laterality: N/A;  . LEFT HEART CATH AND CORONARY ANGIOGRAPHY N/A 01/23/2019   Procedure: LEFT HEART CATH AND CORONARY ANGIOGRAPHY;  Surgeon: Yolonda Kida, MD;  Location: [facility] CV LAB;  Service: Cardiovascular;  Laterality: N/A;  . ovarian cyst removal      Prior to Admission medications   Medication Sig Start Date End Date Taking? Authorizing Provider  acetaminophen (TYLENOL) 325 MG tablet Take 2 tablets (650 mg total) by mouth every 6 (six) hours as needed for mild pain (or Fever >/= 101). 06/26/19   Gouru, Illene Silver, MD  ALBUTEROL IN Inhale 90 mcg into the lungs every 6 (six) hours as needed (wheezing).    [provider]  amLODipine (NORVASC) 10 MG tablet Take 10 mg by mouth daily.    [provider]  aspirin EC 81 MG EC tablet Take 1 tablet (81 mg total) by mouth daily. 06/27/19   Gouru, Illene Silver, MD  atorvastatin (LIPITOR) 40 MG tablet Take 1 tablet by mouth daily. 07/04/18  [provider]  cetirizine (ZYRTEC) 10 MG tablet Take 10 mg by mouth daily.     [provider]  Choline Fenofibrate 45 MG capsule Take 90 mg by mouth daily.    [provider]  furosemide (LASIX) 40 MG tablet Take 1 tablet (40 mg total) by mouth 2 (two) times daily. 02/03/20 03/04/20  Sidney Ace, MD  glipiZIDE (GLUCOTROL XL) 10 MG 24 hr tablet  Take 10 mg by mouth 2 (two) times daily.    [provider]  hydrALAZINE (APRESOLINE) 25 MG tablet Take 25 mg by mouth 3 (three) times daily.     [provider]  isosorbide mononitrate (IMDUR) 30 MG 24 hr tablet Take 30 mg by mouth daily.    [provider]  LANTUS SOLOSTAR 100 UNIT/ML Solostar Pen Inject 16 Units into the skin at bedtime. 10/19/19   [provider]  metFORMIN (GLUCOPHAGE) 1000 MG tablet Take 1,000 mg by mouth 2 (two) times daily with a meal. Just started back. Not sure how many mg    [provider]  metoprolol tartrate (LOPRESSOR) 50 MG tablet Take 1 tablet (50 mg total) by mouth 2 (two) times daily. 02/03/20 03/04/20  Sidney Ace, MD  Multiple Vitamin (MULTIVITAMIN WITH MINERALS) TABS tablet Take 1 tablet by mouth daily.    [provider]  pantoprazole (PROTONIX) 40 MG tablet Take 1 tablet (40 mg total) by mouth 2 (two) times daily. 06/26/19   Gouru, Illene Silver, MD  potassium chloride 20 MEQ TBCR Take 20 mEq by mouth daily. 02/03/20 03/04/20  Sidney Ace, MD  saccharomyces boulardii (FLORASTOR) 250 MG capsule Take 250 mg by mouth 2 (two) times daily.    [provider]  sitaGLIPtin (JANUVIA) 100 MG tablet Take 100 mg by mouth daily.    [provider]    Allergies Accupril [quinapril hcl]  Family History  Problem Relation Age of Onset  . Diabetes Neg Hx   . Hypertension Neg Hx   . Breast cancer Neg Hx     Social History Social History   Tobacco Use  . Smoking status: Former Smoker    Packs/day: 1.00    Years: 40.00    Pack years: 40.00    Types: Cigarettes    Quit date: 07/27/2018    Years since quitting: 1.6  . Smokeless tobacco: Never Used  Substance Use Topics  . Alcohol use: No  . Drug use: No    Review of Systems Constitutional: No fever/chills Eyes: No visual changes. ENT: No sore throat. Cardiovascular: Positive for chest pain. Respiratory: Denies shortness of  breath. Gastrointestinal: No abdominal pain.  No nausea, no vomiting.  No diarrhea.  No constipation. Genitourinary: Negative for dysuria. Musculoskeletal: Negative for neck pain.  Negative for back pain. Integumentary: Negative for rash. Neurological: Negative for headaches, focal weakness or numbness.   ____________________________________________   PHYSICAL EXAM:  VITAL SIGNS: ED Triage Vitals  Enc Vitals Group     BP 03/18/20 2049 (!) 154/57     Pulse Rate 03/18/20 2049 97     Resp 03/18/20 2049 20     Temp 03/18/20 2049 98 F (36.7 C)     Temp Source 03/18/20 2049 Oral     SpO2 03/18/20 2049 98 %     Weight 03/18/20 2048 78 kg (172 lb)     Height 03/18/20 2048 1.702 m (5\' 7" )     Head Circumference --      Peak Flow --  Pain Score 03/18/20 2048 6     Pain Loc --      Pain Edu? --      Excl. in [place]? --     Constitutional: Alert and oriented.  Eyes: Conjunctivae are normal.  Mouth/Throat: Patient is wearing a mask. Neck: No stridor.  No meningeal signs.   Cardiovascular: Normal rate, regular rhythm. Good peripheral circulation. Grossly normal heart sounds. Respiratory: Normal respiratory effort.  No retractions. Gastrointestinal: Soft and nontender. No distention.  Musculoskeletal: No lower extremity tenderness nor edema. No gross deformities of extremities. Neurologic:  Normal speech and language. No gross focal neurologic deficits are appreciated.  Skin:  Skin is warm, dry and intact. Psychiatric: Mood and affect are normal. Speech and behavior are normal.  ____________________________________________   LABS (all labs ordered are listed, but only abnormal results are displayed)  Labs Reviewed  CBC WITH DIFFERENTIAL/PLATELET - Abnormal; Notable for the following components:      Result Value   WBC 12.2 (*)    RBC 3.81 (*)    Hemoglobin 10.1 (*)    HCT 31.2 (*)    Neutro Abs 8.3 (*)    All other components within normal limits  COMPREHENSIVE METABOLIC  PANEL - Abnormal; Notable for the following components:   CO2 18 (*)    BUN 50 (*)    Creatinine, Ser 2.29 (*)    GFR calc non Af Amer 20 (*)    GFR calc Af Amer 23 (*)    Anion gap 16 (*)    All other components within normal limits  TROPONIN I (HIGH SENSITIVITY)  TROPONIN I (HIGH SENSITIVITY)   ____________________________________________  EKG  ED ECG REPORT I, [place] N Bonny Egger, the attending physician, personally viewed and interpreted this ECG.   Date: 03/19/2020  EKG Time: 8:54 PM  Rate: 92  Rhythm: Normal sinus rhythm  Axis: Normal  Intervals: Normal  ST&T Change: None  ____________________________________________  RADIOLOGY I, [place] N Musette Kisamore, personally viewed and evaluated these images (plain radiographs) as part of my medical decision making, as well as reviewing the written report by the radiologist.  ED MD interpretation: No acute cardiopulmonary disease noted on chest x-ray  Official radiology report(s): DG Chest 2 View  Result Date: 03/18/2020 CLINICAL DATA:  Chest pain EXAM: CHEST - 2 VIEW COMPARISON:  02/01/2020 FINDINGS: Heart is normal size. Aortic atherosclerosis. Lungs are clear. No effusions. No acute bony abnormality. IMPRESSION: Aortic atherosclerosis.  No acute cardiopulmonary disease. Electronically Signed   By: Rolm Baptise M.D.   On: 03/18/2020 21:41     Procedures   ____________________________________________   INITIAL IMPRESSION / MDM / ASSESSMENT AND PLAN / ED COURSE  As part of my medical decision making, I reviewed the following data within the [place] NUMBER   79 year old female presented with above-stated history and physical exam secondary chest pain differential diagnosis including but not limited to ACS, angina.  EKG revealed no evidence of ischemia infarction initial troponin normal with a value of 11 however repeat high sensitive troponin now 34.  Patient with ongoing chest pain.  As such patient discussed with  Dr. Damita Dunnings for hospital admission for further evaluation and management.  Patient was given aspirin 3 and 24 mg and heparin. ____________________________________________  FINAL CLINICAL IMPRESSION(S) / ED DIAGNOSES  Final diagnoses:  Chest pain, unspecified type     MEDICATIONS GIVEN DURING THIS VISIT:  Medications - No data to display   ED Discharge Orders    None      *  Please note:  WILLONA PHARISS was evaluated in Emergency Department on 03/19/2020 for the symptoms described in the history of present illness. She was evaluated in the context of the global COVID-19 pandemic, which necessitated consideration that the patient might be at risk for infection with the SARS-CoV-2 virus that causes COVID-19. Institutional protocols and algorithms that pertain to the evaluation of patients at risk for COVID-19 are in a state of rapid change based on information released by regulatory bodies including the CDC and federal and state organizations. These policies and algorithms were followed during the patient's care in the ED.  Some ED evaluations and interventions may be delayed as a result of limited staffing during the pandemic.*  Note:  This document was prepared using Dragon voice recognition software and may include unintentional dictation errors.   Gregor Hams, MD 03/19/20 [phone]

## 2020-03-19 NOTE — ED Notes (Signed)
Attempted to notify pt's daughter Glenard Haring that the patient was in a room, no answer.

## 2020-03-19 NOTE — ED Notes (Signed)
Report given to Amy RN for 2AA, Room 247

## 2020-03-19 NOTE — ED Notes (Signed)
Patient provided crackers with peanut butter and orange juice. RN will re-check CBG in 20 - 30 minutes.

## 2020-03-19 NOTE — ED Notes (Signed)
Warm blankets provided to pt.

## 2020-03-19 NOTE — ED Notes (Signed)
Pt daughter to bedside.

## 2020-03-19 NOTE — Progress Notes (Signed)
ANTICOAGULATION CONSULT NOTE - Initial Consult  Pharmacy Consult for heparin Indication: chest pain/ACS  Allergies  Allergen Reactions  . Accupril [Quinapril Hcl] Other (See Comments)    Reaction: unknown    Patient Measurements: Height: 5\' 7"  (170.2 cm) Weight: 172 lb (78 kg) IBW/kg (Calculated) : 61.6 Heparin Dosing Weight: 68 kg  Vital Signs: Temp: 98 F (36.7 C) (03/23 2049) Temp Source: Oral (03/23 2049) BP: 151/63 (03/24 0230) Pulse Rate: 89 (03/24 0230)  Labs: Recent Labs    03/18/20 2056 03/19/20 0057  HGB 10.1*  --   HCT 31.2*  --   PLT 364  --   CREATININE 2.29*  --   TROPONINIHS 11 34*    Estimated Creatinine Clearance: 21.4 mL/min (A) (by C-G formula based on SCr of 2.29 mg/dL (H)).   Medical History: Past Medical History:  Diagnosis Date  . Aortic valve stenosis   . Carotid bruit   . CHF (congestive heart failure) ([place])   . Chronic kidney disease   . Diabetes mellitus without complication ([place])   . GERD (gastroesophageal reflux disease)   . Heart murmur   . Hyperlipidemia   . Hypertension   . Pneumonia   . Vitamin D deficiency     Medications:  Scheduled:  . heparin  4,000 Units Intravenous Once    Assessment: Patient w/ h/o CKD, CHF w/ cardiomyopathy, DMII, COPD, aortic valve stenosis. Patient arrives x c/o CP non-radiating w/o other symptoms. trops of 11 >> 34, EKG showing normal sinus. Baseline CBC WNL, aPTT/INR pending. No anticoagulation PTA. Being started on heparin drip for management of possible NSTEMI/UA angina.  Goal of Therapy:  Heparin level 0.3-0.7 units/ml Monitor platelets by anticoagulation protocol: Yes   Plan:  Will bolus heparin 4000 units IV x 1 Will start rate at 1000 units/hr and will check anti-Xa at 1100. Will monitor daily CBC's and adjust per anti-Xa levels.  Tobie Lords, PharmD, BCPS Clinical Pharmacist 03/19/2020,3:29 AM

## 2020-03-19 NOTE — ED Notes (Signed)
Lab calls with troponin result. Result is 34 at this time. Dr. Owens Shark notified.

## 2020-03-19 NOTE — Progress Notes (Signed)
*  PRELIMINARY RESULTS* Echocardiogram 2D Echocardiogram has been performed.  Sherrie Sport 03/19/2020, 2:25 PM

## 2020-03-19 NOTE — ED Notes (Signed)
Pt ambulates on own to toilet for urination and BM. Do distress or pain

## 2020-03-19 NOTE — H&P (Signed)
History and Physical    ADDA STOKES MBT:[id number] DOB: Dec 01, 1941 DOA: 03/19/2020  Referring MD/NP/PA:   PCP: Elisabeth Cara, NP   Patient coming from:  The patient is coming from home.  At baseline, pt is independent for most of ADL.        Chief Complaint: chest pain  HPI: Brittney [name] is a 79 y.o. female with medical history significant of hypertension, hyperlipidemia, diabetes mellitus, GERD, sCHF with EF of 45-50%, CAD, who presents with chest pain.  Patient states that her chest pain started yesterday late afternoon, which is located in substernal area, pressure-like 6 out of 10 severity, radiating to the left neck.  Associated with mild shortness of breath.  No cough, fever or chills.  Patient does not have nausea, vomiting, diarrhea.  She reports suprapubic tenderness.  Bladder scan showed 559 ml of urine in bladder in ED. no symptoms of UTI.  Denies recent fall. No rectal bleeding or dark stool.   Pt had cardiac cath on 01/23/2019 by Dr. Clayborn Bigness:  Mid RCA lesion is 99% stenosed.  Prox RCA to Mid RCA lesion is 25% stenosed.  Prox LAD lesion is 70% stenosed.  Mid LAD lesion is 70% stenosed.  Ost Cx to Mid Cx lesion is 15% stenosed.  The left ventricular systolic function is normal.  LV end diastolic pressure is normal.  The left ventricular ejection fraction is greater than 65% by visual estimate.  Conclusion:  Diagnostic cardiac cath from right  radial position Left ventriculogram suggested normal left ventricular function around 65% Left coronary system appears to have moderate to severe disease in the proximal LAD of around 70% as well as a 70% distal LAD lesion RCA had a 99% mid to distal lesion with calcification Patient was treated medically with Imdur Has been referred to tertiary care center for either complex high risk intervention versus coronary bypass surgery if medical therapy is unsuccessful  ED Course: pt was found to have trop 11 -->34,  INR 0.8, PTT 29, WBC 12.2, pending urinalysis, worsening renal function, temperature normal, blood pressure 130/51, heart rate 62, oxygen saturation 94% on room air, chest x-ray negative.  Patient is placed on telemetry bed for observation.  IV heparin is started by EDP.  Cardiology, Dr. Nehemiah Massed is consulted.  Review of Systems:   General: no fevers, chills, no body weight gain, has fatigue HEENT: no blurry vision, hearing changes or sore throat Respiratory: has mild dyspnea, no coughing, wheezing CV: has chest pain, no palpitations GI: no nausea, vomiting, diarrhea, constipation. Has suprapubic pain GU: no dysuria, burning on urination, increased urinary frequency, hematuria  Ext: no leg edema Neuro: no unilateral weakness, numbness, or tingling, no vision change or hearing loss Skin: no rash, no skin tear. MSK: No muscle spasm, no deformity, no limitation of range of movement in spin Heme: No easy bruising.  Travel history: No recent long distant travel.  Allergy:  Allergies  Allergen Reactions  . Accupril [Quinapril Hcl] Other (See Comments)    Reaction: unknown    Past Medical History:  Diagnosis Date  . Aortic valve stenosis   . Carotid bruit   . CHF (congestive heart failure) ([place])   . Chronic kidney disease   . Diabetes mellitus without complication ([place])   . GERD (gastroesophageal reflux disease)   . Heart murmur   . Hyperlipidemia   . Hypertension   . Pneumonia   . Vitamin D deficiency     Past Surgical History:  Procedure Laterality  Date  . COLONOSCOPY    . COLONOSCOPY WITH PROPOFOL N/A 08/02/2019   Procedure: COLONOSCOPY WITH PROPOFOL;  Surgeon: Virgel Manifold, MD;  Location: [facility] ENDOSCOPY;  Service: Endoscopy;  Laterality: N/A;  . CORONARY ANGIOPLASTY    . ESOPHAGOGASTRODUODENOSCOPY N/A 06/26/2019   Procedure: ESOPHAGOGASTRODUODENOSCOPY (EGD);  Surgeon: Virgel Manifold, MD;  Location: [facility] ENDOSCOPY;  Service: Endoscopy;  Laterality: N/A;  .  ESOPHAGOGASTRODUODENOSCOPY (EGD) WITH PROPOFOL N/A 08/02/2019   Procedure: ESOPHAGOGASTRODUODENOSCOPY (EGD) WITH PROPOFOL;  Surgeon: Virgel Manifold, MD;  Location: [facility] ENDOSCOPY;  Service: Endoscopy;  Laterality: N/A;  . LEFT HEART CATH AND CORONARY ANGIOGRAPHY N/A 01/23/2019   Procedure: LEFT HEART CATH AND CORONARY ANGIOGRAPHY;  Surgeon: Yolonda Kida, MD;  Location: [place] [facility];  Service: Cardiovascular;  Laterality: N/A;  . ovarian cyst removal      Social History:  reports that she quit smoking about 19 months ago. Her smoking use included cigarettes. She has a 40.00 pack-year smoking history. She has never used smokeless tobacco. She reports that she does not drink alcohol or use drugs.  Family History:  Family History  Problem Relation Age of Onset  . Diabetes Neg Hx   . Hypertension Neg Hx   . Breast cancer Neg Hx      Prior to Admission medications   Medication Sig Start Date End Date Taking? Authorizing Provider  acetaminophen (TYLENOL) 325 MG tablet Take 2 tablets (650 mg total) by mouth every 6 (six) hours as needed for mild pain (or Fever >/= 101). 06/26/19  Yes Gouru, Aruna, MD  albuterol (VENTOLIN HFA) 108 (90 Base) MCG/ACT inhaler Inhale 2 puffs into the lungs every 6 (six) hours as needed for wheezing.   Yes [provider]  amLODipine (NORVASC) 10 MG tablet Take 10 mg by mouth daily.   Yes [provider]  aspirin EC 81 MG EC tablet Take 1 tablet (81 mg total) by mouth daily. 06/27/19  Yes Gouru, Illene Silver, MD  atorvastatin (LIPITOR) 40 MG tablet Take 40 mg by mouth daily.  07/04/18  Yes [provider]  cetirizine (ZYRTEC) 10 MG tablet Take 10 mg by mouth daily.    Yes [provider]  Choline Fenofibrate 45 MG capsule Take 45 mg by mouth daily.    Yes [provider]  furosemide (LASIX) 40 MG tablet Take 40 mg by mouth 2 (two) times daily.   Yes [provider]  glipiZIDE (GLUCOTROL XL) 10 MG 24 hr tablet  Take 10 mg by mouth 2 (two) times daily.   Yes [provider]  hydrALAZINE (APRESOLINE) 25 MG tablet Take 25 mg by mouth 3 (three) times daily.    Yes [provider]  isosorbide mononitrate (IMDUR) 30 MG 24 hr tablet Take 30 mg by mouth 3 (three) times daily.    Yes [provider]  LANTUS SOLOSTAR 100 UNIT/ML Solostar Pen Inject 16 Units into the skin at bedtime. 10/19/19  Yes [provider]  losartan (COZAAR) 100 MG tablet Take 100 mg by mouth daily.   Yes [provider]  metFORMIN (GLUCOPHAGE) 1000 MG tablet Take 1,000 mg by mouth 2 (two) times daily with a meal.    Yes [provider]  metoprolol tartrate (LOPRESSOR) 50 MG tablet Take 50 mg by mouth 2 (two) times daily.   Yes [provider]  Multiple Vitamin (MULTIVITAMIN WITH MINERALS) TABS tablet Take 1 tablet by mouth daily.   Yes [provider]  pantoprazole (PROTONIX) 40 MG  tablet Take 1 tablet (40 mg total) by mouth 2 (two) times daily. 06/26/19  Yes Gouru, Illene Silver, MD  saccharomyces boulardii (FLORASTOR) 250 MG capsule Take 250 mg by mouth 2 (two) times daily.   Yes [provider]  sitaGLIPtin (JANUVIA) 100 MG tablet Take 100 mg by mouth daily.   Yes [provider]  spironolactone (ALDACTONE) 25 MG tablet Take 25 mg by mouth 2 (two) times daily. 02/07/20  Yes [provider]    Physical Exam: Vitals:   03/19/20 0900 03/19/20 0930 03/19/20 1000 03/19/20 1030  BP: (!) 155/55 (!) 135/53 (!) 159/77 (!) 151/56  Pulse: 78 74 87 71  Resp: (!) 22 20 18 20   Temp:      TempSrc:      SpO2: 96% 94% 95% 97%  Weight:      Height:       General: Not in acute distress HEENT:       Eyes: PERRL, EOMI, no scleral icterus.       ENT: No discharge from the ears and nose, no pharynx injection, no tonsillar enlargement.        Neck: No JVD, no mass felt. Heme: No neck lymph node enlargement. Cardiac: S1/S2, RRR, has 2/6 systolic murmurs, No  gallops or rubs. Respiratory: No rales, wheezing, rhonchi or rubs. GI: Soft, nondistended, no rebound pain, no organomegaly, BS present. Has suprapubic tenderness which is most likely due to extended bladder. GU: No hematuria Ext: No pitting leg edema bilaterally. 2+DP/PT pulse bilaterally. Musculoskeletal: No joint deformities, No joint redness or warmth, no limitation of ROM in spin. Skin: No rashes.  Neuro: Alert, oriented X3, cranial nerves II-XII grossly intact, moves all extremities normally.   Psych: Patient is not psychotic, no suicidal or hemocidal ideation.  Labs on Admission: I have personally reviewed following labs and imaging studies  CBC: Recent Labs  Lab 03/18/20 2056  WBC 12.2*  NEUTROABS 8.3*  HGB 10.1*  HCT 31.2*  MCV 81.9  PLT 382   Basic Metabolic Panel: Recent Labs  Lab 03/18/20 2056  NA 139  K 5.1  CL 105  CO2 18*  GLUCOSE 90  BUN 50*  CREATININE 2.29*  CALCIUM 9.2   GFR: Estimated Creatinine Clearance: 21.4 mL/min (A) (by C-G formula based on SCr of 2.29 mg/dL (H)). Liver Function Tests: Recent Labs  Lab 03/18/20 2056  AST 16  ALT 13  ALKPHOS 42  BILITOT 0.7  PROT 7.9  ALBUMIN 4.2   No results for input(s): LIPASE, AMYLASE in the last 168 hours. No results for input(s): AMMONIA in the last 168 hours. Coagulation Profile: Recent Labs  Lab 03/19/20 0057  INR 0.8   Cardiac Enzymes: No results for input(s): CKTOTAL, CKMB, CKMBINDEX, TROPONINI in the last 168 hours. BNP (last 3 results) No results for input(s): PROBNP in the last 8760 hours. HbA1C: No results for input(s): HGBA1C in the last 72 hours. CBG: Recent Labs  Lab 03/19/20 1107 03/19/20 1131  GLUCAP 59* 118*   Lipid Profile: No results for input(s): CHOL, HDL, LDLCALC, TRIG, CHOLHDL, LDLDIRECT in the last 72 hours. Thyroid Function Tests: No results for input(s): TSH, T4TOTAL, FREET4, T3FREE, THYROIDAB in the last 72 hours. Anemia Panel: No results for input(s):  VITAMINB12, FOLATE, FERRITIN, TIBC, IRON, RETICCTPCT in the last 72 hours. Urine analysis:    Component Value Date/Time   COLORURINE STRAW (A) 03/19/2020 1028   APPEARANCEUR CLEAR (A) 03/19/2020 1028   LABSPEC 1.009 03/19/2020 1028   PHURINE 5.0 03/19/2020  [place] 03/19/2020 1028   HGBUR NEGATIVE 03/19/2020 1028   BILIRUBINUR NEGATIVE 03/19/2020 1028   [place] 03/19/2020 1028   PROTEINUR 100 (A) 03/19/2020 1028   NITRITE NEGATIVE 03/19/2020 1028   LEUKOCYTESUR NEGATIVE 03/19/2020 1028   Sepsis Labs: @LABRCNTIP (procalcitonin:4,lacticidven:4) )No results found for this or any previous visit (from the past 240 hour(s)).   Radiological Exams on Admission: DG Chest 2 View  Result Date: 03/18/2020 CLINICAL DATA:  Chest pain EXAM: CHEST - 2 VIEW COMPARISON:  02/01/2020 FINDINGS: Heart is normal size. Aortic atherosclerosis. Lungs are clear. No effusions. No acute bony abnormality. IMPRESSION: Aortic atherosclerosis.  No acute cardiopulmonary disease. Electronically Signed   By: Rolm Baptise M.D.   On: 03/18/2020 21:41     EKG: Independently reviewed.  Sinus rhythm, QTC 447, LAE, LAD, no obvious ischemic change  Assessment/Plan Principal Problem:   Chest pain Active Problems:   Benign essential HTN   Type II diabetes mellitus with renal manifestations (HCC)   GERD without esophagitis   Elevated troponin   Acute renal failure superimposed on stage 3a chronic kidney disease (HCC)   CAD (coronary artery disease)   Chronic systolic CHF (congestive heart failure) (HCC)   Leukocytosis   Chest pain, elevated trop and hx of CAD: trop 11 -->34 -->47. Card. Dr. Nehemiah Massed is consulted  - will place on Tele bed for obs - IV heparin was started by EDP - Trend Trop - Repeat EKG in the am  - prn Nitroglycerin, Morphine, and aspirin, lipitor, fenofibrate, metoprolol, Imdur - Risk factor stratification: will check FLP and A1C  - 2d echo  Benign essential  HTN: -Continue home medications: Metoprolol, amlodipine, hydralazine -hold Cozaar, Lasix, spironolactone due to worsening renal function -hydralazine prn  Type II diabetes mellitus with renal manifestations ([place]): Most recent A1c 8.4, poorly controled. Patient is taking Januvia, Metformin, glipizide at home.  Patient used to take Lantus, currently patient is not taking Lantus.  Blood sugar 90 by BMP -SSI  GERD without esophagitis: -protonix  Acute renal failure superimposed on stage 3a chronic kidney disease ([place]): Baseline Cre is 1.3 -1.6 , pt's Cre is 2.29 and BUN 50 on admission. Likely due to dehydration and continuation of ARB - Follow up renal function by BMP - Avoid using renal toxic medications, hypotension and contrast dye (or carefully use) - Hold spironolactone, Lasix, Cozaar  Chronic systolic CHF (congestive heart failure) ([place]): 2D echo on 01/02/2019 showed EF of 45-50%.  Patient does not have leg edema CHF seem to be compensated. -Check BNP -hold Lasix, spironolactone  Leukocytosis: WBC 12.2. No fever, likely reactive -Follow-up with a CBC  Suprapubic tenderness: Most likely due to bladder extension. UA negative for UTI. -asked RN to do In and out cath       DVT ppx: on IV Heparin   Code Status: Full code Family Communication:   Yes, patient's daughter at bed side Disposition Plan:  Anticipate discharge back to previous home environment Consults called: Dr. Nehemiah Massed of Card Admission status:   Tele bed for obs  Date of Service 03/19/2020    [facility]   If 7PM-7AM, please contact night-coverage [email or web address] 03/19/2020, 11:39 AM

## 2020-03-19 NOTE — Consult Note (Signed)
[facility] Cardiology Consultation Note  Patient ID: Brittney [name], MRN: [id number], DOB/AGE: October 05, 1941 79 y.o. Admit date: 03/19/2020   Date of Consult: 03/19/2020 Primary Physician: Elisabeth Cara, NP Primary Cardiologist: [facility]  Chief Complaint:  Chief Complaint  Patient presents with  . Chest Pain   Reason for Consult: Chest pain  HPI: 79 y.o. female with known coronary artery disease with previous diffuse coronary atherosclerosis medically managed with cardiac catheterization last year showing changes but medical management was performed.  The patient has done fairly well over the last year with appropriate medication management including metoprolol isosorbide hydralazine atorvastatin amlodipine.  The patient also has apparent aortic valve stenosis for which has caused some symptoms as well.  She has had new onset of chest discomfort which is lasted throughout the afternoon and now is completely resolved but she could not tell exactly how it was resolved.  Currently she is hemodynamically stable.  She does have an EKG showing normal sinus rhythm.  Normal EKG troponin level has increased from 11-47 with a glomerular filtration rate of 20 a chest x-ray which is normal and a previous LV systolic dysfunction with ejection fraction of 40% by previous echocardiogram.  Currently there is no evidence of primary acute coronary syndrome and patient is on maximize medication management and therefore may need further medication management or treatment prior to consideration of cardiac catheterization  Past Medical History:  Diagnosis Date  . Aortic valve stenosis   . Carotid bruit   . CHF (congestive heart failure) ([place])   . Chronic kidney disease   . Diabetes mellitus without complication ([place])   . GERD (gastroesophageal reflux disease)   . Heart murmur   . Hyperlipidemia   . Hypertension   . Pneumonia   . Vitamin D deficiency       Surgical History:  Past Surgical History:   Procedure Laterality Date  . COLONOSCOPY    . COLONOSCOPY WITH PROPOFOL N/A 08/02/2019   Procedure: COLONOSCOPY WITH PROPOFOL;  Surgeon: Virgel Manifold, MD;  Location: [facility] ENDOSCOPY;  Service: Endoscopy;  Laterality: N/A;  . CORONARY ANGIOPLASTY    . ESOPHAGOGASTRODUODENOSCOPY N/A 06/26/2019   Procedure: ESOPHAGOGASTRODUODENOSCOPY (EGD);  Surgeon: Virgel Manifold, MD;  Location: [facility] ENDOSCOPY;  Service: Endoscopy;  Laterality: N/A;  . ESOPHAGOGASTRODUODENOSCOPY (EGD) WITH PROPOFOL N/A 08/02/2019   Procedure: ESOPHAGOGASTRODUODENOSCOPY (EGD) WITH PROPOFOL;  Surgeon: Virgel Manifold, MD;  Location: [facility] ENDOSCOPY;  Service: Endoscopy;  Laterality: N/A;  . LEFT HEART CATH AND CORONARY ANGIOGRAPHY N/A 01/23/2019   Procedure: LEFT HEART CATH AND CORONARY ANGIOGRAPHY;  Surgeon: Yolonda Kida, MD;  Location: [facility];  Service: Cardiovascular;  Laterality: N/A;  . ovarian cyst removal       Home Meds: Prior to Admission medications   Medication Sig Start Date End Date Taking? Authorizing Provider  acetaminophen (TYLENOL) 325 MG tablet Take 2 tablets (650 mg total) by mouth every 6 (six) hours as needed for mild pain (or Fever >/= 101). 06/26/19  Yes Gouru, Aruna, MD  albuterol (VENTOLIN HFA) 108 (90 Base) MCG/ACT inhaler Inhale 2 puffs into the lungs every 6 (six) hours as needed for wheezing.   Yes [provider]  amLODipine (NORVASC) 10 MG tablet Take 10 mg by mouth daily.   Yes [provider]  aspirin EC 81 MG EC tablet Take 1 tablet (81 mg total) by mouth daily. 06/27/19  Yes Gouru, Illene Silver, MD  atorvastatin (LIPITOR) 40 MG tablet Take 40 mg by mouth daily.  07/04/18  Yes [provider]  cetirizine (ZYRTEC) 10 MG tablet Take 10 mg by mouth daily.    Yes [provider]  Choline Fenofibrate 45 MG capsule Take 45 mg by mouth daily.    Yes [provider]  furosemide (LASIX) 40 MG tablet Take 40 mg by mouth 2 (two) times daily.    Yes [provider]  glipiZIDE (GLUCOTROL XL) 10 MG 24 hr tablet Take 10 mg by mouth 2 (two) times daily.   Yes [provider]  hydrALAZINE (APRESOLINE) 25 MG tablet Take 25 mg by mouth 3 (three) times daily.    Yes [provider]  isosorbide mononitrate (IMDUR) 30 MG 24 hr tablet Take 30 mg by mouth 3 (three) times daily.    Yes [provider]  LANTUS SOLOSTAR 100 UNIT/ML Solostar Pen Inject 16 Units into the skin at bedtime. 10/19/19  Yes [provider]  losartan (COZAAR) 100 MG tablet Take 100 mg by mouth daily.   Yes [provider]  metFORMIN (GLUCOPHAGE) 1000 MG tablet Take 1,000 mg by mouth 2 (two) times daily with a meal.    Yes [provider]  metoprolol tartrate (LOPRESSOR) 50 MG tablet Take 50 mg by mouth 2 (two) times daily.   Yes [provider]  Multiple Vitamin (MULTIVITAMIN WITH MINERALS) TABS tablet Take 1 tablet by mouth daily.   Yes [provider]  pantoprazole (PROTONIX) 40 MG tablet Take 1 tablet (40 mg total) by mouth 2 (two) times daily. 06/26/19  Yes Gouru, Illene Silver, MD  saccharomyces boulardii (FLORASTOR) 250 MG capsule Take 250 mg by mouth 2 (two) times daily.   Yes [provider]  sitaGLIPtin (JANUVIA) 100 MG tablet Take 100 mg by mouth daily.   Yes [provider]  spironolactone (ALDACTONE) 25 MG tablet Take 25 mg by mouth 2 (two) times daily. 02/07/20  Yes [provider]    Inpatient Medications:  . amLODipine  10 mg Oral Daily  . [START ON 03/20/2020] aspirin EC  81 mg Oral Daily  . atorvastatin  40 mg Oral Daily  . fenofibrate  54 mg Oral Daily  . hydrALAZINE  25 mg Oral TID  . insulin aspart  0-5 Units Subcutaneous QHS  . insulin aspart  0-9 Units Subcutaneous TID WC  . isosorbide mononitrate  30 mg Oral TID  . loratadine  10 mg Oral Daily  . metoprolol tartrate  50 mg Oral BID  . multivitamin with minerals  1 tablet Oral Daily  . pantoprazole  40  mg Oral BID  . saccharomyces boulardii  250 mg Oral BID   . heparin 1,000 Units/hr (03/19/20 0356)    Allergies:  Allergies  Allergen Reactions  . Accupril [Quinapril Hcl] Other (See Comments)    Reaction: unknown    Social History   Socioeconomic History  . Marital status: Widowed    Spouse name: Not on file  . Number of children: Not on file  . Years of education: Not on file  . Highest education level: Not on file  Occupational History  . Not on file  Tobacco Use  . Smoking status: Former Smoker    Packs/day: 1.00    Years: 40.00    Pack years: 40.00    Types: Cigarettes    Quit date: 07/27/2018    Years since quitting: 1.6  . Smokeless tobacco: Never Used  Substance and Sexual Activity  . Alcohol use: No  . Drug use: No  .  Sexual activity: Not on file  Other Topics Concern  . Not on file  Social History Narrative  . Not on file   Social Determinants of Health   Financial Resource Strain:   . Difficulty of Paying Living Expenses:   Food Insecurity:   . Worried About Charity fundraiser in the Last Year:   . Arboriculturist in the Last Year:   Transportation Needs:   . Film/video editor (Medical):   Marland Kitchen Lack of Transportation (Non-Medical):   Physical Activity:   . Days of Exercise per Week:   . Minutes of Exercise per Session:   Stress:   . Feeling of Stress :   Social Connections:   . Frequency of Communication with Friends and Family:   . Frequency of Social Gatherings with Friends and Family:   . Attends Religious Services:   . Active Member of Clubs or Organizations:   . Attends Archivist Meetings:   Marland Kitchen Marital Status:   Intimate Partner Violence:   . Fear of Current or Ex-Partner:   . Emotionally Abused:   Marland Kitchen Physically Abused:   . Sexually Abused:      Family History  Problem Relation Age of Onset  . Diabetes Neg Hx   . Hypertension Neg Hx   . Breast cancer Neg Hx      Review of Systems Positive for chest pain Negative  for: General:  chills, fever, night sweats or weight changes.  Cardiovascular: PND orthopnea syncope dizziness  Dermatological skin lesions rashes Respiratory: Cough congestion Urologic: Frequent urination urination at night and hematuria Abdominal: negative for nausea, vomiting, diarrhea, bright red blood per rectum, melena, or hematemesis Neurologic: negative for visual changes, and/or hearing changes  All other systems reviewed and are otherwise negative except as noted above.  Labs: No results for input(s): CKTOTAL, CKMB, TROPONINI in the last 72 hours. Lab Results  Component Value Date   WBC 12.2 (H) 03/18/2020   HGB 10.1 (L) 03/18/2020   HCT 31.2 (L) 03/18/2020   MCV 81.9 03/18/2020   PLT 364 03/18/2020    Recent Labs  Lab 03/18/20 2056  NA 139  K 5.1  CL 105  CO2 18*  BUN 50*  CREATININE 2.29*  CALCIUM 9.2  PROT 7.9  BILITOT 0.7  ALKPHOS 42  ALT 13  AST 16  GLUCOSE 90   No results found for: CHOL, HDL, LDLCALC, TRIG No results found for: DDIMER  Radiology/Studies:  DG Chest 2 View  Result Date: 03/18/2020 CLINICAL DATA:  Chest pain EXAM: CHEST - 2 VIEW COMPARISON:  02/01/2020 FINDINGS: Heart is normal size. Aortic atherosclerosis. Lungs are clear. No effusions. No acute bony abnormality. IMPRESSION: Aortic atherosclerosis.  No acute cardiopulmonary disease. Electronically Signed   By: Rolm Baptise M.D.   On: 03/18/2020 21:41    EKG: Normal sinus rhythm otherwise normal EKG  Weights: Filed Weights   03/18/20 2048  Weight: 78 kg     Physical Exam: Blood pressure (!) 145/74, pulse 77, temperature 98.6 F (37 C), temperature source Oral, resp. rate 16, height 5\' 7"  (1.702 m), weight 78 kg, SpO2 99 %. Body mass index is 26.94 kg/m. General: Well developed, well nourished, in no acute distress. Head eyes ears nose throat: Normocephalic, atraumatic, sclera non-icteric, no xanthomas, nares are without discharge. No apparent thyromegaly and/or mass  Lungs:  Normal respiratory effort.  no wheezes, no rales, no rhonchi.  Heart: RRR with normal S1 SOFT S2.  3+ aortic murmur  gallop, no rub, PMI is normal size and placement, carotid upstroke normal without bruit, jugular venous pressure is normal Abdomen: Soft, non-tender, non-distended with normoactive bowel sounds. No hepatomegaly. No rebound/guarding. No obvious abdominal masses. Abdominal aorta is normal size without bruit Extremities: Trace edema. no cyanosis, no clubbing, no ulcers  Peripheral : 2+ bilateral upper extremity pulses, 2+ bilateral femoral pulses, 2+ bilateral dorsal pedal pulse Neuro: Alert and oriented. No facial asymmetry. No focal deficit. Moves all extremities spontaneously. Musculoskeletal: Normal muscle tone without kyphosis Psych:  Responds to questions appropriately with a normal affect.    Assessment: 79 year old female with aortic valve stenosis mild LV systolic dysfunction chronic kidney disease stage IV hypertension hyperlipidemia diabetes on appropriate previous medication management for anginal symptoms with an anginal symptom with minimal elevation of troponin and no clear apparent acute coronary syndrome  Plan: 1.  Continue serial ECG and enzymes to assess for possible myocardial infarction or acute coronary syndrome 2.  Continuation of aggressive medication management for known coronary artery disease including hypertension control with hydralazine metoprolol amlodipine 3.  Anginal symptom control with isosorbide metoprolol and amlodipine 4.  High intensity cholesterol therapy 5.  Begin ambulation and follow for improvements of symptoms and possible further diagnostic depending on above  Signed, Corey Skains M.D. [facility] 03/19/2020, 12:58 PM

## 2020-03-19 NOTE — Progress Notes (Signed)
ANTICOAGULATION CONSULT NOTE - Initial Consult  Pharmacy Consult for heparin Indication: chest pain/ACS  Allergies  Allergen Reactions  . Accupril [Quinapril Hcl] Other (See Comments)    Reaction: unknown    Patient Measurements: Height: 5\' 7"  (170.2 cm) Weight: 170 lb 8 oz (77.3 kg) IBW/kg (Calculated) : 61.6 Heparin Dosing Weight: 68 kg  Vital Signs: Temp: 98.7 F (37.1 C) (03/24 1644) Temp Source: Oral (03/24 1644) BP: 134/65 (03/24 1644) Pulse Rate: 62 (03/24 1644)  Labs: Recent Labs    03/18/20 2056 03/18/20 2056 03/19/20 0057 03/19/20 0057 03/19/20 1009 03/19/20 1028 03/19/20 1304 03/19/20 1606 03/19/20 1900  HGB 10.1*  --   --   --   --   --   --   --   --   HCT 31.2*  --   --   --   --   --   --   --   --   PLT 364  --   --   --   --   --   --   --   --   APTT  --   --  29  --   --   --   --   --   --   LABPROT  --   --  11.4  --   --   --   --   --   --   INR  --   --  0.8  --   --   --   --   --   --   HEPARINUNFRC  --   --   --   --   --  0.56  --   --  0.46  CREATININE 2.29*  --   --   --   --   --   --   --   --   TROPONINIHS 11   < > 34*   < > 47*  --  38* 34*  --    < > = values in this interval not displayed.    Estimated Creatinine Clearance: 21.4 mL/min (A) (by C-G formula based on SCr of 2.29 mg/dL (H)).   Medical History: Past Medical History:  Diagnosis Date  . Aortic valve stenosis   . Carotid bruit   . CHF (congestive heart failure) ([place])   . Chronic kidney disease   . Diabetes mellitus without complication ([place])   . GERD (gastroesophageal reflux disease)   . Heart murmur   . Hyperlipidemia   . Hypertension   . Pneumonia   . Vitamin D deficiency     Medications:  Scheduled:  . amLODipine  10 mg Oral Daily  . [START ON 03/20/2020] aspirin EC  81 mg Oral Daily  . atorvastatin  40 mg Oral Daily  . fenofibrate  54 mg Oral Daily  . hydrALAZINE  25 mg Oral TID  . insulin aspart  0-5 Units Subcutaneous QHS  . insulin aspart   0-9 Units Subcutaneous TID WC  . isosorbide mononitrate  30 mg Oral TID  . loratadine  10 mg Oral Daily  . metoprolol tartrate  50 mg Oral BID  . multivitamin with minerals  1 tablet Oral Daily  . pantoprazole  40 mg Oral BID  . saccharomyces boulardii  250 mg Oral BID    Assessment: Patient w/ h/o CKD, CHF w/ cardiomyopathy, DMII, COPD, aortic valve stenosis. Patient arrives x c/o CP non-radiating w/o other symptoms. trops of 11 >> 34, EKG showing  normal sinus. Baseline CBC WNL, aPTT/INR pending. No anticoagulation PTA. Being started on heparin drip for management of possible NSTEMI/UA angina.  3/24 Heparin initiated at 1000 units/hr 3/24 10:28 HL 0.56, therapeutic x 1 3/24 1900 HL 0.46   Goal of Therapy:  Heparin level 0.3-0.7 units/ml Monitor platelets by anticoagulation protocol: Yes   Plan:  Heparin level is therapeutic x 2. Will continue with current rate of 1000 units/hr. Will check HL and CBC with AM labs. Daily CBC while on Heparin drip.  Eleonore Chiquito, PharmD, BCPS 03/19/2020 7:21 PM

## 2020-03-19 NOTE — Progress Notes (Signed)
Ch attempted visit for prayer. Pt was asleep. Will pass OR on to in-coming chaplains tomorrow morning.

## 2020-03-20 DIAGNOSIS — I10 Essential (primary) hypertension: Secondary | ICD-10-CM | POA: Diagnosis not present

## 2020-03-20 DIAGNOSIS — R079 Chest pain, unspecified: Secondary | ICD-10-CM | POA: Diagnosis not present

## 2020-03-20 DIAGNOSIS — I5022 Chronic systolic (congestive) heart failure: Secondary | ICD-10-CM | POA: Diagnosis not present

## 2020-03-20 LAB — CBC
HCT: 31 % — ABNORMAL LOW (ref 36.0–46.0)
Hemoglobin: 10.1 g/dL — ABNORMAL LOW (ref 12.0–15.0)
MCH: 26.3 pg (ref 26.0–34.0)
MCHC: 32.6 g/dL (ref 30.0–36.0)
MCV: 80.7 fL (ref 80.0–100.0)
Platelets: 371 10*3/uL (ref 150–400)
RBC: 3.84 MIL/uL — ABNORMAL LOW (ref 3.87–5.11)
RDW: 14.9 % (ref 11.5–15.5)
WBC: 8.7 10*3/uL (ref 4.0–10.5)
nRBC: 0 % (ref 0.0–0.2)

## 2020-03-20 LAB — URINE CULTURE: Culture: NO GROWTH

## 2020-03-20 LAB — GLUCOSE, CAPILLARY
Glucose-Capillary: 159 mg/dL — ABNORMAL HIGH (ref 70–99)
Glucose-Capillary: 182 mg/dL — ABNORMAL HIGH (ref 70–99)

## 2020-03-20 LAB — LIPID PANEL
Cholesterol: 123 mg/dL (ref 0–200)
HDL: 31 mg/dL — ABNORMAL LOW (ref >40)
LDL Cholesterol: 57 mg/dL (ref 0–99)
Total CHOL/HDL Ratio: 4 RATIO
Triglycerides: 176 mg/dL — ABNORMAL HIGH (ref <150)
VLDL: 35 mg/dL (ref 0–40)

## 2020-03-20 LAB — HEMOGLOBIN A1C
Hgb A1c MFr Bld: 8.6 % — ABNORMAL HIGH (ref 4.8–5.6)
Mean Plasma Glucose: 200.12 mg/dL

## 2020-03-20 LAB — HEPARIN LEVEL (UNFRACTIONATED): Heparin Unfractionated: 0.76 IU/mL — ABNORMAL HIGH (ref 0.30–0.70)

## 2020-03-20 LAB — ECHOCARDIOGRAM COMPLETE

## 2020-03-20 MED ORDER — NITROGLYCERIN 0.4 MG SL SUBL: 0.4000 mg | SUBLINGUAL_TABLET | SUBLINGUAL | 0 refills | Status: AC | PRN

## 2020-03-20 NOTE — Evaluation (Signed)
Physical Therapy Evaluation Patient Details Name: RIPLEY LOVECCHIO MRN: [id number] DOB: January 19, 1941 Today's Date: 03/20/2020   History of Present Illness  Pt is a 79 y.o. female presenting to ED 03/18/20 secondary acute onset nonradiating upper chest pain.  Pt admitted with chest pain and elevated troponin.  PMH includes htn, HLD, CHF, aortic valve stenosis, CKD, DM, and PNA.  Clinical Impression  Prior to hospital admission, pt was independent with ambulation and lives with family.  Currently pt is modified independent with bed mobility; independent with transfers; CGA to SBA with ambulation 300 feet (no AD); and CGA to hand hold assist with navigating stairs.  Pt reports her stairs at home are shorter than the hospital steps and her home stairs are easier to navigate (pt educated to have someone assist her on steps at home for safety--pt reporting she would have this assist available).  Pt appears close to pt's self reported functional baseline.  Plan for pt to discharge home today.  No further PT anticipated/recommended upon hospital discharge (MD notified).    Follow Up Recommendations No PT follow up;Other (comment)(assist for stairs)    Equipment Recommendations  None recommended by PT    Recommendations for Other Services       Precautions / Restrictions Precautions Precautions: Fall Restrictions Weight Bearing Restrictions: No      Mobility  Bed Mobility Overal bed mobility: Modified Independent             General bed mobility comments: no difficulties noted  Transfers Overall transfer level: Independent Equipment used: None             General transfer comment: fairly strong transfers noted  Ambulation/Gait Ambulation/Gait assistance: Min guard;Supervision Gait Distance (Feet): 300 Feet Assistive device: None   Gait velocity: mildly decreased   General Gait Details: mildly increased BOS and increased B lateral sway noted but pt steady without any loss  of balance (pt reports this is her baseline walking mechanics)  Stairs Stairs: Yes Stairs assistance: (CGA to hand hold assist)   Number of Stairs: (6 steps plus 3 steps) General stair comments: pt mildly unsteady ascending 6 steps with no UE support (CGA provided for safety) and hand hold assist provided descending 6 steps; then ascended/descended 3 steps with hand hold assist safely  Wheelchair Mobility    Modified Rankin (Stroke Patients Only)       Balance Overall balance assessment: Needs assistance Sitting-balance support: No upper extremity supported;Feet supported Sitting balance-Leahy Scale: Normal Sitting balance - Comments: steady sitting reaching outside BOS   Standing balance support: No upper extremity supported;During functional activity Standing balance-Leahy Scale: Good(no loss of balance noted with ambulation)                 High Level Balance Comments: No loss of balance noted with ambulation and head turns R/L/up/down, increasing/decreasing speed, and turning 180 degrees and stopping             Pertinent Vitals/Pain Pain Assessment: No/denies pain  Vitals (HR and O2 on room air) stable and WFL throughout treatment session.    Home Living Family/patient expects to be discharged to:: Private residence Living Arrangements: Children(Pt's daughter and 70 y.o. great grandson) Available Help at Discharge: Family Type of Home: House Home Access: Stairs to enter Entrance Stairs-Rails: None Entrance Stairs-Number of Steps: 3-4 Home Layout: One level Home Equipment: Environmental consultant - 2 wheels;Cane - single point      Prior Function Level of Independence: Independent with assistive device(s)  Comments: Pt independent with ambulation in home (uses SPC outside and in community).  Pt reports no falls in past 6 months.     Hand Dominance        Extremity/Trunk Assessment   Upper Extremity Assessment Upper Extremity Assessment: Overall WFL for  tasks assessed    Lower Extremity Assessment Lower Extremity Assessment: Overall WFL for tasks assessed    Cervical / Trunk Assessment Cervical / Trunk Assessment: Normal  Communication   Communication: No difficulties  Cognition Arousal/Alertness: Awake/alert Behavior During Therapy: WFL for tasks assessed/performed Overall Cognitive Status: Within Functional Limits for tasks assessed                                        General Comments   Nursing cleared pt for participation in physical therapy.  Pt agreeable to PT session.    Exercises     Assessment/Plan    PT Assessment Patent does not need any further PT services  PT Problem List         PT Treatment Interventions      PT Goals (Current goals can be found in the Care Plan section)  Acute Rehab PT Goals Patient Stated Goal: to go home PT Goal Formulation: With patient Time For Goal Achievement: 04/03/20 Potential to Achieve Goals: Good    Frequency     Barriers to discharge        Co-evaluation               AM-PAC PT "6 Clicks" Mobility  Outcome Measure Help needed turning from your back to your side while in a flat bed without using bedrails?: None Help needed moving from lying on your back to sitting on the side of a flat bed without using bedrails?: None Help needed moving to and from a bed to a chair (including a wheelchair)?: None Help needed standing up from a chair using your arms (e.g., wheelchair or bedside chair)?: None Help needed to walk in hospital room?: A Little Help needed climbing 3-5 steps with a railing? : A Little 6 Click Score: 22    End of Session Equipment Utilized During Treatment: Gait belt Activity Tolerance: Patient tolerated treatment well Patient left: in bed;with call bell/phone within reach;with bed alarm set Nurse Communication: Mobility status;Precautions PT Visit Diagnosis: Muscle weakness (generalized) (M62.81)    Time: 2297-9892 PT Time  Calculation (min) (ACUTE ONLY): 23 min   Charges:   PT Evaluation $PT Eval Low Complexity: 1 Low         Sabastien Tyler, PT 03/20/20, 4:02 PM

## 2020-03-20 NOTE — Discharge Summary (Addendum)
Physician Discharge Summary  Brittney [name] XBJ:[id number] DOB: 06-Jul-1941 DOA: 03/19/2020  PCP: Elisabeth Cara, NP  Admit date: 03/19/2020 Discharge date: 03/20/2020  Admitted From: home Disposition:  home  Recommendations for Outpatient Follow-up:  1. Follow up with PCP in 1-2 weeks 2. Please obtain BMP/CBC in one week 3. Please follow up with cardiology in 1 week  Home Health: yes, RN and PT  Equipment/Devices: none  Discharge Condition: Stable  CODE STATUS: Full Diet recommendation: Heart Healthy / Carb Modified  Brief/Interim Summary:  Brittney [name] is a 79 y.o. female with medical history significant of hypertension, hyperlipidemia, diabetes mellitus, GERD, sCHF with EF of 45-50%, CAD, who presented to the ED on 3/24 with chest pain x 1 day, substernal, pressure-like, 6 out of 10 severity, radiating to the left neck, with associated mild shortness of breath.  Patient had cardiac cath done in Jan 2020 and has been medically managed for her coronary artery disease.  In the ED, vitals stable.  Labs notable for troponin 11 -->34 (later peaked at 47 and down-trended), WBC 12.2, worsening renal function, chest x-ray negative.  Patient admitted to hospitalist service with [facility] cardiology consulted and advised continued aggressive medical management and close outpatient follow up.  No further inpatient diagnostics were recommended.  Patient's chest pain resolved.  She is stable for discharge home today.  Home health nurse was arranged for assistance with her multiple medications.  Patient evaluated by PT and found to be at her functional baseline without need for continued therapy.     Discharge Diagnoses: Principal Problem:   Chest pain Active Problems:   Benign essential HTN   Type II diabetes mellitus with renal manifestations (HCC)   GERD without esophagitis   Elevated troponin   Acute renal failure superimposed on stage 3a chronic kidney disease (HCC)   CAD (coronary artery  disease)   Chronic systolic CHF (congestive heart failure) (HCC)   Leukocytosis  Chest pain, atypical, likely secondary to Angina, without ACS - resolved.   Discharge Instructions   Discharge Instructions    (HEART FAILURE PATIENTS) Call MD:  Anytime you have any of the following symptoms: 1) 3 pound weight gain in 24 hours or 5 pounds in 1 week 2) shortness of breath, with or without a dry hacking cough 3) swelling in the hands, feet or stomach 4) if you have to sleep on extra pillows at night in order to breathe.   Complete by: As directed    Call MD for:  extreme fatigue   Complete by: As directed    Call MD for:  persistant dizziness or light-headedness   Complete by: As directed    Call MD for:  severe uncontrolled pain   Complete by: As directed    Diet - low sodium heart healthy   Complete by: As directed    Increase activity slowly   Complete by: As directed      Allergies as of 03/20/2020      Reactions   Accupril [quinapril Hcl] Other (See Comments)   Reaction: unknown      Medication List    TAKE these medications   acetaminophen 325 MG tablet Commonly known as: TYLENOL Take 2 tablets (650 mg total) by mouth every 6 (six) hours as needed for mild pain (or Fever >/= 101).   albuterol 108 (90 Base) MCG/ACT inhaler Commonly known as: VENTOLIN HFA Inhale 2 puffs into the lungs every 6 (six) hours as needed for wheezing.   amLODipine 10  MG tablet Commonly known as: NORVASC Take 10 mg by mouth daily.   aspirin 81 MG EC tablet Take 1 tablet (81 mg total) by mouth daily.   atorvastatin 40 MG tablet Commonly known as: LIPITOR Take 40 mg by mouth daily.   cetirizine 10 MG tablet Commonly known as: ZYRTEC Take 10 mg by mouth daily.   Choline Fenofibrate 45 MG capsule Take 45 mg by mouth daily.   furosemide 40 MG tablet Commonly known as: LASIX Take 40 mg by mouth 2 (two) times daily.   glipiZIDE 10 MG 24 hr tablet Commonly known as: GLUCOTROL XL Take 10  mg by mouth 2 (two) times daily.   hydrALAZINE 25 MG tablet Commonly known as: APRESOLINE Take 25 mg by mouth 3 (three) times daily.   isosorbide mononitrate 30 MG 24 hr tablet Commonly known as: IMDUR Take 30 mg by mouth 3 (three) times daily.   Lantus SoloStar 100 UNIT/ML Solostar Pen Generic drug: insulin glargine Inject 16 Units into the skin at bedtime.   losartan 100 MG tablet Commonly known as: COZAAR Take 100 mg by mouth daily.   metFORMIN 1000 MG tablet Commonly known as: GLUCOPHAGE Take 1,000 mg by mouth 2 (two) times daily with a meal.   metoprolol tartrate 50 MG tablet Commonly known as: LOPRESSOR Take 50 mg by mouth 2 (two) times daily.   multivitamin with minerals Tabs tablet Take 1 tablet by mouth daily.   nitroGLYCERIN 0.4 MG SL tablet Commonly known as: NITROSTAT Place 1 tablet (0.4 mg total) under the tongue every 5 (five) minutes as needed for chest pain.   pantoprazole 40 MG tablet Commonly known as: PROTONIX Take 1 tablet (40 mg total) by mouth 2 (two) times daily.   saccharomyces boulardii 250 MG capsule Commonly known as: FLORASTOR Take 250 mg by mouth 2 (two) times daily.   sitaGLIPtin 100 MG tablet Commonly known as: JANUVIA Take 100 mg by mouth daily.   spironolactone 25 MG tablet Commonly known as: ALDACTONE Take 25 mg by mouth 2 (two) times daily.      Follow-up Information    Lujean Amel D, MD. Schedule an appointment as soon as possible for a visit in 1 week(s).   Specialties: Cardiology, Internal Medicine Contact information: [place] [address] [phone]        Elisabeth Cara, NP. Schedule an appointment as soon as possible for a visit in 1 week(s).   Specialty: Nurse Practitioner Contact information: [place] [address] [phone]          Allergies  Allergen Reactions  . Accupril [Quinapril Hcl] Other (See Comments)    Reaction: unknown     Consultations:  Cardiology, Dr. Nehemiah Massed    Procedures/Studies: DG Chest 2 View  Result Date: 03/18/2020 CLINICAL DATA:  Chest pain EXAM: CHEST - 2 VIEW COMPARISON:  02/01/2020 FINDINGS: Heart is normal size. Aortic atherosclerosis. Lungs are clear. No effusions. No acute bony abnormality. IMPRESSION: Aortic atherosclerosis.  No acute cardiopulmonary disease. Electronically Signed   By: Rolm Baptise M.D.   On: 03/18/2020 21:41   ECHOCARDIOGRAM COMPLETE  Result Date: 03/20/2020    ECHOCARDIOGRAM REPORT   Patient Name:   Brittney [name] Date of Exam: 03/19/2020 Medical Rec #:  [id number]        Height:       67.0 in Accession #:    [id number]       Weight:       172.0 lb  Date of Birth:  July 07, 1941       BSA:          1.897 m Patient Age:    51 years         BP:           145/74 mmHg Patient Gender: F                HR:           77 bpm. Exam Location:  [facility] Procedure: 2D Echo, Cardiac Doppler and Color Doppler Indications:     Chest pain 786.50  History:         Patient has prior history of Echocardiogram examinations, most                  recent 01/02/2019. CHF, Signs/Symptoms:Murmur; Risk                  Factors:Hypertension and Diabetes.  Sonographer:     Sherrie Sport RDCS (AE) Referring Phys:  [id number] Soledad Gerlach NIU Diagnosing Phys: Neoma Laming MD IMPRESSIONS  1. Left ventricular ejection fraction, by estimation, is 60 to 65%. The left ventricle has normal function. The left ventricle has no regional wall motion abnormalities. Left ventricular diastolic parameters are consistent with Grade I diastolic dysfunction (impaired relaxation).  2. Right ventricular systolic function is normal. The right ventricular size is normal. There is mildly elevated pulmonary artery systolic pressure.  3. The mitral valve is normal in structure. Mild mitral valve regurgitation. No evidence of mitral stenosis.  4. The aortic valve is normal in structure. Aortic valve regurgitation is not visualized. No aortic stenosis is  present.  5. The inferior vena cava is normal in size with greater than 50% respiratory variability, suggesting right atrial pressure of 3 mmHg. FINDINGS  Left Ventricle: Left ventricular ejection fraction, by estimation, is 60 to 65%. The left ventricle has normal function. The left ventricle has no regional wall motion abnormalities. The left ventricular internal cavity size was normal in size. There is  no left ventricular hypertrophy. Left ventricular diastolic parameters are consistent with Grade I diastolic dysfunction (impaired relaxation). Right Ventricle: The right ventricular size is normal. No increase in right ventricular wall thickness. Right ventricular systolic function is normal. There is mildly elevated pulmonary artery systolic pressure. The tricuspid regurgitant velocity is 2.31  m/s, and with an assumed right atrial pressure of 10 mmHg, the estimated right ventricular systolic pressure is 80.9 mmHg. Left Atrium: Left atrial size was normal in size. Right Atrium: Right atrial size was normal in size. Pericardium: There is no evidence of pericardial effusion. Mitral Valve: The mitral valve is normal in structure. Normal mobility of the mitral valve leaflets. Mild mitral valve regurgitation. No evidence of mitral valve stenosis. Tricuspid Valve: The tricuspid valve is normal in structure. Tricuspid valve regurgitation is trivial. No evidence of tricuspid stenosis. Aortic Valve: The aortic valve is normal in structure. Aortic valve regurgitation is not visualized. No aortic stenosis is present. Aortic valve mean gradient measures 6.0 mmHg. Aortic valve peak gradient measures 9.9 mmHg. Aortic valve area, by VTI measures 2.37 cm. Pulmonic Valve: The pulmonic valve was normal in structure. Pulmonic valve regurgitation is not visualized. No evidence of pulmonic stenosis. Aorta: The aortic root is normal in size and structure. Venous: The inferior vena cava is normal in size with greater than 50%  respiratory variability, suggesting right atrial pressure of 3 mmHg. IAS/Shunts: No atrial level shunt detected by color flow Doppler.  LEFT VENTRICLE PLAX 2D LVIDd:         5.05 cm  Diastology LVIDs:         3.15 cm  LV e' lateral:   10.10 cm/s LV PW:         0.94 cm  LV E/e' lateral: 12.2 LV IVS:        0.80 cm  LV e' medial:    5.66 cm/s LVOT diam:     2.20 cm  LV E/e' medial:  21.7 LV SV:         88 LV SV Index:   46 LVOT Area:     3.80 cm  RIGHT VENTRICLE RV Basal diam:  3.06 cm RV S prime:     12.80 cm/s TAPSE (M-mode): 3.5 cm LEFT ATRIUM             Index       RIGHT ATRIUM           Index LA diam:        3.80 cm 2.00 cm/m  RA Area:     15.80 cm LA Vol (A2C):   66.8 ml 35.22 ml/m RA Volume:   37.80 ml  19.93 ml/m LA Vol (A4C):   89.8 ml 47.35 ml/m LA Biplane Vol: 81.0 ml 42.71 ml/m  AORTIC VALVE                    PULMONIC VALVE AV Area (Vmax):    2.14 cm     PV Vmax:        0.98 m/s AV Area (Vmean):   2.24 cm     PV Peak grad:   3.8 mmHg AV Area (VTI):     2.37 cm     RVOT Peak grad: 5 mmHg AV Vmax:           157.67 cm/s AV Vmean:          114.000 cm/s AV VTI:            0.371 m AV Peak Grad:      9.9 mmHg AV Mean Grad:      6.0 mmHg LVOT Vmax:         88.70 cm/s LVOT Vmean:        67.300 cm/s LVOT VTI:          0.232 m LVOT/AV VTI ratio: 0.62  AORTA Ao Root diam: 2.90 cm MITRAL VALVE                TRICUSPID VALVE MV Area (PHT): 2.82 cm     TR Peak grad:   21.3 mmHg MV Decel Time: 269 msec     TR Vmax:        231.00 cm/s MV E velocity: 123.00 cm/s MV A velocity: 138.00 cm/s  SHUNTS MV E/A ratio:  0.89         Systemic VTI:  0.23 m                             Systemic Diam: 2.20 cm Neoma Laming MD Electronically signed by Neoma Laming MD Signature Date/Time: 03/20/2020/9:38:09 AM    Final          Subjective: Patient seen with daughter at bedside this AM.  She denies chest pain or SOB or other complaints.  Daughter reports concern over patient's multiple medications and frequent adjustments  in dosing and/or frequency.     Discharge Exam:  Vitals:   03/20/20 0801 03/20/20 1155  BP: (!) 151/75 (!) 144/68  Pulse: 80 (!) 58  Resp: 18 18  Temp: 98.4 F (36.9 C)   SpO2: 98% 97%   Vitals:   03/20/20 0018 03/20/20 0442 03/20/20 0801 03/20/20 1155  BP: (!) 116/45 (!) 141/69 (!) 151/75 (!) 144/68  Pulse: 63 65 80 (!) 58  Resp: 16  18 18   Temp: 98.6 F (37 C) 98.2 F (36.8 C) 98.4 F (36.9 C)   TempSrc: Oral Oral Oral   SpO2: 98% 100% 98% 97%  Weight:      Height:        General: Pt is alert, awake, not in acute distress Cardiovascular: RRR, S1/S2 +, no rubs, no gallops Respiratory: CTA bilaterally, no wheezing, no rhonchi Abdominal: Soft, NT, ND, bowel sounds + Extremities: no edema, no cyanosis    The results of significant diagnostics from this hospitalization (including imaging, microbiology, ancillary and laboratory) are listed below for reference.     Microbiology: Recent Results (from the past 240 hour(s))  SARS CORONAVIRUS 2 (TAT 6-24 HRS) Nasopharyngeal Nasopharyngeal Swab     Status: None   Collection Time: 03/19/20  2:55 AM   Specimen: Nasopharyngeal Swab  Result Value Ref Range Status   SARS Coronavirus 2 NEGATIVE NEGATIVE Final    Comment: (NOTE) SARS-CoV-2 target nucleic acids are NOT DETECTED. The SARS-CoV-2 RNA is generally detectable in upper and lower respiratory specimens during the acute phase of infection. Negative results do not preclude SARS-CoV-2 infection, do not rule out co-infections with other pathogens, and should not be used as the sole basis for treatment or other patient management decisions. Negative results must be combined with clinical observations, patient history, and epidemiological information. The expected result is Negative. Fact Sheet for Patients: SugarRoll.be Fact Sheet for Healthcare Providers: https://www.woods-mathews.com/ This test is not yet approved or cleared by  the [place] FDA and  has been authorized for detection and/or diagnosis of SARS-CoV-2 by FDA under an Emergency Use Authorization (EUA). This EUA will remain  in effect (meaning this test can be used) for the duration of the COVID-19 declaration under Section 56 4(b)(1) of the Act, 21 U.S.C. section 360bbb-3(b)(1), unless the authorization is terminated or revoked sooner. Performed at [facility], [place] [address], [place], [place] [address]   Urine Culture     Status: None   Collection Time: 03/19/20 10:28 AM   Specimen: Urine, Random  Result Value Ref Range Status   Specimen Description   Final    URINE, RANDOM Performed at [facility], [address], [place], [place] [address]    Special Requests   Final    NONE Performed at [facility], [address], [place], [place] [address]    Culture   Final    NO GROWTH Performed at [facility], [place] [address], [place],  [address]    Report Status 03/20/2020 FINAL  Final     Labs: BNP (last 3 results) Recent Labs    02/01/20 0445 02/02/20 0559 03/19/20 1028  BNP 190.0* 247.0* 735.3*   Basic Metabolic Panel: Recent Labs  Lab 03/18/20 2056  NA 139  K 5.1  CL 105  CO2 18*  GLUCOSE 90  BUN 50*  CREATININE 2.29*  CALCIUM 9.2   Liver Function Tests: Recent Labs  Lab 03/18/20 2056  AST 16  ALT 13  ALKPHOS 42  BILITOT 0.7  PROT 7.9  ALBUMIN 4.2  No results for input(s): LIPASE, AMYLASE in the last 168 hours. No results for input(s): AMMONIA in the last 168 hours. CBC: Recent Labs  Lab 03/18/20 2056 03/20/20 0614  WBC 12.2* 8.7  NEUTROABS 8.3*  --   HGB 10.1* 10.1*  HCT 31.2* 31.0*  MCV 81.9 80.7  PLT 364 371   Cardiac Enzymes: No results for input(s): CKTOTAL, CKMB, CKMBINDEX, TROPONINI in the last 168 hours. BNP: Invalid input(s): POCBNP CBG: Recent Labs  Lab 03/19/20 1131 03/19/20 1900 03/19/20 2137 03/20/20 0802 03/20/20 1153  GLUCAP 118*  154* 261* 159* 182*   D-Dimer No results for input(s): DDIMER in the last 72 hours. Hgb A1c Recent Labs    03/20/20 0614  HGBA1C 8.6*   Lipid Profile Recent Labs    03/20/20 0614  CHOL 123  HDL 31*  LDLCALC 57  TRIG 176*  CHOLHDL 4.0   Thyroid function studies No results for input(s): TSH, T4TOTAL, T3FREE, THYROIDAB in the last 72 hours.  Invalid input(s): FREET3 Anemia work up No results for input(s): VITAMINB12, FOLATE, FERRITIN, TIBC, IRON, RETICCTPCT in the last 72 hours. Urinalysis    Component Value Date/Time   COLORURINE STRAW (A) 03/19/2020 1028   APPEARANCEUR CLEAR (A) 03/19/2020 1028   LABSPEC 1.009 03/19/2020 1028   PHURINE 5.0 03/19/2020 1028   GLUCOSEU NEGATIVE 03/19/2020 1028   HGBUR NEGATIVE 03/19/2020 1028   BILIRUBINUR NEGATIVE 03/19/2020 1028   KETONESUR NEGATIVE 03/19/2020 1028   PROTEINUR 100 (A) 03/19/2020 1028   NITRITE NEGATIVE 03/19/2020 1028   LEUKOCYTESUR NEGATIVE 03/19/2020 1028   Sepsis Labs Invalid input(s): PROCALCITONIN,  WBC,  LACTICIDVEN Microbiology Recent Results (from the past 240 hour(s))  SARS CORONAVIRUS 2 (TAT 6-24 HRS) Nasopharyngeal Nasopharyngeal Swab     Status: None   Collection Time: 03/19/20  2:55 AM   Specimen: Nasopharyngeal Swab  Result Value Ref Range Status   SARS Coronavirus 2 NEGATIVE NEGATIVE Final    Comment: (NOTE) SARS-CoV-2 target nucleic acids are NOT DETECTED. The SARS-CoV-2 RNA is generally detectable in upper and lower respiratory specimens during the acute phase of infection. Negative results do not preclude SARS-CoV-2 infection, do not rule out co-infections with other pathogens, and should not be used as the sole basis for treatment or other patient management decisions. Negative results must be combined with clinical observations, patient history, and epidemiological information. The expected result is Negative. Fact Sheet for Patients: SugarRoll.be Fact Sheet  for Healthcare Providers: https://www.woods-mathews.com/ This test is not yet approved or cleared by the [place] FDA and  has been authorized for detection and/or diagnosis of SARS-CoV-2 by FDA under an Emergency Use Authorization (EUA). This EUA will remain  in effect (meaning this test can be used) for the duration of the COVID-19 declaration under Section 56 4(b)(1) of the Act, 21 U.S.C. section 360bbb-3(b)(1), unless the authorization is terminated or revoked sooner. Performed at [facility], [place] [address], [place], [place] [address]   Urine Culture     Status: None   Collection Time: 03/19/20 10:28 AM   Specimen: Urine, Random  Result Value Ref Range Status   Specimen Description   Final    URINE, RANDOM Performed at [facility], [address], [place], [place] [address]    Special Requests   Final    NONE Performed at [facility], [address], [place], [place] [address]    Culture   Final    NO GROWTH Performed at [facility], [place]  [address] [place], [place] [address]    Report Status 03/20/2020 FINAL  Final     Time coordinating discharge: Over 30 minutes  SIGNED:   Ezekiel Slocumb, DO [facility] 03/20/2020, 1:50 PM   If 7PM-7AM, please contact night-coverage [email or web address]

## 2020-03-20 NOTE — Progress Notes (Signed)
Patient had uneventful night; denied chest pain or other complaints. Heparin continues at 1000 units per hour. EKG completed this am, hard copy placed in chart. Showed SB at 59 and NSR at 64.

## 2020-03-20 NOTE — TOC Initial Note (Signed)
Transition of Care [facility]) - Initial/Assessment Note    Patient Details  Name: Brittney [name] MRN: [id number] Date of Birth: [birth year]  Transition of Care [facility]) CM/SW Contact:    Victorino Dike, RN Phone Number: 03/20/2020, 1:14 PM  Clinical Narrative:               Met with patient and daughter in room, Daughter had questions about pill packs.  After doing a little research, Patient will be seen by primary care at a clinic that has a pharmacy and the daughter will request at that time.  If that does not work, she was given resources for other pharmacies that provide this service.  Patient will be receiving HH RN and PT after discharge by [facility].  No further TOC needs at this time, please re-consult for new needs.   Expected Discharge Plan: [place] Barriers to Discharge: Continued Medical Work up   Patient Goals and CMS Choice Patient states their goals for this hospitalization and ongoing recovery are:: to return home CMS Medicare.gov Compare Post Acute Care list provided to:: Patient Choice offered to / list presented to : Patient, Adult Children  Expected Discharge Plan and Services Expected Discharge Plan: [place]   Discharge Planning Services: CM Consult Post Acute Care Choice: [place] arrangements for the past 2 months: Single Family Home                           HH Arranged: PT, RN [facility] Agency: [facility] Date [place]: 03/20/20 Time [place]: 7829 Representative spoke with at [place]: [place] Arrangements/Services Living arrangements for the past 2 months: [place] with:: Self Patient language and need for interpreter reviewed:: Yes Do you feel safe going back to the place where you live?: Yes      Need for Family Participation in Patient Care: Yes (Comment) Care giver support system in place?: Yes (comment)   Criminal Activity/Legal  Involvement Pertinent to Current Situation/Hospitalization: No - Comment as needed  Activities of Daily Living Home Assistive Devices/Equipment: Cane (specify quad or straight), Bedside commode/3-in-1, Shower chair with back, CBG Meter, Blood pressure cuff, Dentures (specify type), Eyeglasses ADL Screening (condition at time of admission) Patient's cognitive ability adequate to safely complete daily activities?: Yes Is the patient deaf or have difficulty hearing?: No Does the patient have difficulty seeing, even when wearing glasses/contacts?: No Does the patient have difficulty concentrating, remembering, or making decisions?: No Patient able to express need for assistance with ADLs?: Yes Does the patient have difficulty dressing or bathing?: No Independently performs ADLs?: Yes (appropriate for developmental age) Does the patient have difficulty walking or climbing stairs?: No Weakness of Legs: None Weakness of Arms/Hands: None  Permission Sought/Granted                  Emotional Assessment Appearance:: Appears stated age, Well-Groomed Attitude/Demeanor/Rapport: Engaged Affect (typically observed): Accepting Orientation: : Oriented to Self, Oriented to Place, Oriented to  Time, Oriented to Situation Alcohol / Substance Use: Not Applicable Psych Involvement: No (comment)  Admission diagnosis:  NSTEMI (non-ST elevated myocardial infarction) ([place]) [I21.4] Chest pain [R07.9] Chest pain, unspecified type [R07.9] Patient Active Problem List   Diagnosis Date Noted  . Chest pain 03/19/2020  . Acute renal failure superimposed on stage 3a chronic kidney disease ([place]) 03/19/2020  . CAD (coronary artery disease) 03/19/2020  .  Chronic systolic CHF (congestive heart failure) ([place]) 03/19/2020  . Leukocytosis 03/19/2020  . Acute exacerbation of CHF (congestive heart failure) ([place]) 02/01/2020  . Elevated troponin 02/01/2020  . Reflux esophagitis   . CLE (columnar lined esophagus)   .  Duodenitis 06/25/2019  . Chronic heart failure with preserved ejection fraction (HFpEF) ([place]) 02/26/2019  . Benign essential HTN 01/17/2019  . DM (diabetes mellitus) ([place]) 01/17/2019  . Type II diabetes mellitus with renal manifestations ([place]) 07/24/2015  . GERD without esophagitis 07/24/2015  . Hyperlipidemia 07/24/2015   PCP:  Elisabeth Cara, NP Pharmacy:   [facility] [phone] - [address], [place] [address] [place] [address] Phone: [phone] Fax: [phone]     Social Determinants of Health (SDOH) Interventions    Readmission Risk Interventions Readmission Risk Prevention Plan 08/10/2018  Transportation Screening Complete  PCP or Specialist Appt within 5-7 Days Complete  Home Care Screening Complete  Medication Review (RN CM) Complete  Some recent data might be hidden

## 2020-03-20 NOTE — Progress Notes (Signed)
[place] received OR for prayer for pt. yesterday but several visit attempts were unsuccessful.  Today pt. was awake and sitting on bedside, just finished w/PT, when [facility] arrived.  Pt. in good spirits; said she was about to be discharged.  Pt. lives in her own house w/dtr. and great grandson.  Pt. was brought to hospital w/shortness of breath and chest pain, but reported that she feels much better today.  Pt. requested [place] sit on bed beside her and pray for her.  [facility] offered prayer of blessing for pt. and her family as she returns home.     03/20/20 1430  Clinical Encounter Type  Visited With Patient  Visit Type Initial  Referral From Nurse  Spiritual Encounters  Spiritual Needs Emotional;Prayer

## 2020-03-20 NOTE — Progress Notes (Addendum)
[place] for heparin Indication: chest pain/ACS  Allergies  Allergen Reactions  . Accupril [Quinapril Hcl] Other (See Comments)    Reaction: unknown    Patient Measurements: Height: 5\' 7"  (170.2 cm) Weight: 170 lb 8 oz (77.3 kg) IBW/kg (Calculated) : 61.6 Heparin Dosing Weight: 68 kg  Vital Signs: Temp: 98.2 F (36.8 C) (03/25 0442) Temp Source: Oral (03/25 0442) BP: 141/69 (03/25 0442) Pulse Rate: 65 (03/25 0442)  Labs: Recent Labs    03/18/20 2056 03/18/20 2056 03/19/20 0057 03/19/20 0057 03/19/20 1009 03/19/20 1028 03/19/20 1304 03/19/20 1606 03/19/20 1900 03/20/20 0614  HGB 10.1*  --   --   --   --   --   --   --   --  10.1*  HCT 31.2*  --   --   --   --   --   --   --   --  31.0*  PLT 364  --   --   --   --   --   --   --   --  371  APTT  --   --  29  --   --   --   --   --   --   --   LABPROT  --   --  11.4  --   --   --   --   --   --   --   INR  --   --  0.8  --   --   --   --   --   --   --   HEPARINUNFRC  --   --   --   --   --  0.56  --   --  0.46 0.76*  CREATININE 2.29*  --   --   --   --   --   --   --   --   --   TROPONINIHS 11   < > 34*   < > 47*  --  38* 34*  --   --    < > = values in this interval not displayed.    Estimated Creatinine Clearance: 21.4 mL/min (A) (by C-G formula based on SCr of 2.29 mg/dL (H)).   Medical History: Past Medical History:  Diagnosis Date  . Aortic valve stenosis   . Carotid bruit   . CHF (congestive heart failure) ([place])   . Chronic kidney disease   . Diabetes mellitus without complication ([place])   . GERD (gastroesophageal reflux disease)   . Heart murmur   . Hyperlipidemia   . Hypertension   . Pneumonia   . Vitamin D deficiency     Medications:  Scheduled:  . amLODipine  10 mg Oral Daily  . aspirin EC  81 mg Oral Daily  . atorvastatin  40 mg Oral Daily  . fenofibrate  54 mg Oral Daily  . hydrALAZINE  25 mg Oral TID  . insulin aspart  0-5 Units Subcutaneous  QHS  . insulin aspart  0-9 Units Subcutaneous TID WC  . isosorbide mononitrate  30 mg Oral TID  . loratadine  10 mg Oral Daily  . metoprolol tartrate  50 mg Oral BID  . multivitamin with minerals  1 tablet Oral Daily  . pantoprazole  40 mg Oral BID  . saccharomyces boulardii  250 mg Oral BID    Assessment: Patient w/ h/o CKD, CHF w/ cardiomyopathy, DMII, COPD, aortic valve stenosis.  Patient arrives x c/o CP non-radiating w/o other symptoms. trops of 11 >> 34, EKG showing normal sinus. Baseline CBC WNL, aPTT/INR pending. No anticoagulation PTA. Being started on heparin drip for management of possible NSTEMI/UA angina.  3/24 Heparin initiated at 1000 units/hr 3/24 10:28 HL 0.56, therapeutic x 1 3/24 1900 HL 0.46  3/24 0703 HL 0.76, slightly supratherapeutic  Goal of Therapy:  Heparin level 0.3-0.7 units/ml Monitor platelets by anticoagulation protocol: Yes   Plan:  Will decreased heparin rate to 950 units/hr- nursing notified of change. Will check HL in 6 hours after rate change and CBC with AM labs. Daily CBC while on Heparin drip.  Kristeen Miss, PharmD Clinical Pharmacist 03/20/2020 7:31 AM

## 2020-03-20 NOTE — Care Management Obs Status (Addendum)
MEDICARE OBSERVATION STATUS NOTIFICATION   Patient Details  Name: Brittney [name] MRN: [id number] Date of Birth: Aug 23, 1941   Medicare Observation Status Notification Given:  Yes  Explained to Daughter at bedside and mother.  Printed form and gave a copy.  Victorino Dike, RN 03/20/2020, 1:20 PM

## 2020-03-20 NOTE — Progress Notes (Signed)
[facility] Encounter Note  Patient: Brittney [name] / Admit Date: 03/19/2020 / Date of Encounter: 03/20/2020, 8:38 AM   Subjective: Patient with no further significant anginal symptoms congestive heart failure or further concerns from the cardiac standpoint.  Has been ambulating relatively well and tolerating continued outpatient medication management for coronary artery disease with stable angina and aortic valve stenosis.  Elevated troponin flat and below 100 throughout last 24 hours suggesting no evidence of myocardial infarction or acute coronary syndrome.  Review of Systems: Positive for: None Negative for: Vision change, hearing change, syncope, dizziness, nausea, vomiting,diarrhea, bloody stool, stomach pain, cough, congestion, diaphoresis, urinary frequency, urinary pain,skin lesions, skin rashes Others previously listed  Objective: Telemetry: Normal sinus rhythm Physical Exam: Blood pressure (!) 151/75, pulse 80, temperature 98.4 F (36.9 C), temperature source Oral, resp. rate 18, height 5\' 7"  (1.702 m), weight 77.3 kg, SpO2 98 %. Body mass index is 26.7 kg/m. General: Well developed, well nourished, in no acute distress. Head: Normocephalic, atraumatic, sclera non-icteric, no xanthomas, nares are without discharge. Neck: No apparent masses Lungs: Normal respirations with no wheezes, no rhonchi, no rales , few crackles   Heart: Regular rate and rhythm, normal S1 S2, 2 to 3+ upper sternal border murmur, no rub, no gallop, PMI is normal size and placement, carotid upstroke normal without bruit, jugular venous pressure normal Abdomen: Soft, non-tender, non-distended with normoactive bowel sounds. No hepatosplenomegaly. Abdominal aorta is normal size without bruit Extremities: Trace edema, no clubbing, no cyanosis, no ulcers,  Peripheral: 2+ radial, 2+ femoral, 2+ dorsal pedal pulses Neuro: Alert and oriented. Moves all extremities spontaneously. Psych:   Responds to questions appropriately with a normal affect.   Intake/Output Summary (Last 24 hours) at 03/20/2020 0838 Last data filed at 03/19/2020 1500 Gross per 24 hour  Intake 148.73 ml  Output 530 ml  Net -381.27 ml    Inpatient Medications:  . amLODipine  10 mg Oral Daily  . aspirin EC  81 mg Oral Daily  . atorvastatin  40 mg Oral Daily  . fenofibrate  54 mg Oral Daily  . hydrALAZINE  25 mg Oral TID  . insulin aspart  0-5 Units Subcutaneous QHS  . insulin aspart  0-9 Units Subcutaneous TID WC  . isosorbide mononitrate  30 mg Oral TID  . loratadine  10 mg Oral Daily  . metoprolol tartrate  50 mg Oral BID  . multivitamin with minerals  1 tablet Oral Daily  . pantoprazole  40 mg Oral BID  . saccharomyces boulardii  250 mg Oral BID   Infusions:  . heparin 950 Units/hr (03/20/20 0807)    Labs: Recent Labs    03/18/20 2056  NA 139  K 5.1  CL 105  CO2 18*  GLUCOSE 90  BUN 50*  CREATININE 2.29*  CALCIUM 9.2   Recent Labs    03/18/20 2056  AST 16  ALT 13  ALKPHOS 42  BILITOT 0.7  PROT 7.9  ALBUMIN 4.2   Recent Labs    03/18/20 2056 03/20/20 0614  WBC 12.2* 8.7  NEUTROABS 8.3*  --   HGB 10.1* 10.1*  HCT 31.2* 31.0*  MCV 81.9 80.7  PLT 364 371   No results for input(s): CKTOTAL, CKMB, TROPONINI in the last 72 hours. Invalid input(s): POCBNP No results for input(s): HGBA1C in the last 72 hours.   Weights: Filed Weights   03/18/20 2048 03/19/20 1255  Weight: 78 kg 77.3 kg     Radiology/Studies:  DG  Chest 2 View  Result Date: 03/18/2020 CLINICAL DATA:  Chest pain EXAM: CHEST - 2 VIEW COMPARISON:  02/01/2020 FINDINGS: Heart is normal size. Aortic atherosclerosis. Lungs are clear. No effusions. No acute bony abnormality. IMPRESSION: Aortic atherosclerosis.  No acute cardiopulmonary disease. Electronically Signed   By: Rolm Baptise M.D.   On: 03/18/2020 21:41     Assessment and Recommendation  79 y.o. female with known coronary artery disease mild LV  systolic dysfunction chronic kidney disease stage IV diabetes hypertension hyperlipidemia previously on appropriate medication management for coronary atherosclerosis having episode of substernal chest discomfort atypical in nature without evidence of myocardial infarction and/or acute coronary syndrome 1.  Continue medication management as previous from outpatient for coronary disease stable angina hypertension hyperlipidemia and diabetes 2.  Echocardiogram pending for extent of aortic valve stenosis but clinically not significant requiring further intervention at this time 3.  Begin ambulation and follow for improvements of symptoms and may be adjustment of mild medication management for coronary artery disease and multiple risk factors as above 4.  No further cardiac diagnostics necessary at this time 5.  If ambulating well okay for discharge home with follow-up next week for further adjustments of medication management as needed  Signed, Serafina Royals M.D. FACC

## 2020-03-31 ENCOUNTER — Encounter: Payer: Self-pay | Admitting: Emergency Medicine

## 2020-03-31 ENCOUNTER — Other Ambulatory Visit: Payer: Self-pay

## 2020-03-31 ENCOUNTER — Inpatient Hospital Stay
Admission: EM | Admit: 2020-03-31 | Discharge: 2020-04-04 | DRG: 392 | Disposition: A | Discharge status: Home / Self Care | Payer: Medicare HMO | Attending: Internal Medicine | Admitting: Internal Medicine

## 2020-03-31 ENCOUNTER — Emergency Department: Payer: Medicare HMO

## 2020-03-31 DIAGNOSIS — Z7982 Long term (current) use of aspirin: Secondary | ICD-10-CM

## 2020-03-31 DIAGNOSIS — D631 Anemia in chronic kidney disease: Secondary | ICD-10-CM | POA: Diagnosis present

## 2020-03-31 DIAGNOSIS — E1122 Type 2 diabetes mellitus with diabetic chronic kidney disease: Secondary | ICD-10-CM | POA: Diagnosis present

## 2020-03-31 DIAGNOSIS — R1084 Generalized abdominal pain: Secondary | ICD-10-CM | POA: Diagnosis not present

## 2020-03-31 DIAGNOSIS — K2980 Duodenitis without bleeding: Secondary | ICD-10-CM | POA: Diagnosis present

## 2020-03-31 DIAGNOSIS — N1832 Chronic kidney disease, stage 3b: Secondary | ICD-10-CM | POA: Diagnosis present

## 2020-03-31 DIAGNOSIS — I5022 Chronic systolic (congestive) heart failure: Secondary | ICD-10-CM

## 2020-03-31 DIAGNOSIS — E785 Hyperlipidemia, unspecified: Secondary | ICD-10-CM | POA: Diagnosis present

## 2020-03-31 DIAGNOSIS — K317 Polyp of stomach and duodenum: Secondary | ICD-10-CM | POA: Diagnosis present

## 2020-03-31 DIAGNOSIS — I10 Essential (primary) hypertension: Secondary | ICD-10-CM | POA: Diagnosis not present

## 2020-03-31 DIAGNOSIS — Z79899 Other long term (current) drug therapy: Secondary | ICD-10-CM | POA: Diagnosis not present

## 2020-03-31 DIAGNOSIS — I350 Nonrheumatic aortic (valve) stenosis: Secondary | ICD-10-CM | POA: Diagnosis present

## 2020-03-31 DIAGNOSIS — Y92239 Unspecified place in hospital as the place of occurrence of the external cause: Secondary | ICD-10-CM | POA: Diagnosis present

## 2020-03-31 DIAGNOSIS — E1121 Type 2 diabetes mellitus with diabetic nephropathy: Secondary | ICD-10-CM

## 2020-03-31 DIAGNOSIS — Z888 Allergy status to other drugs, medicaments and biological substances status: Secondary | ICD-10-CM | POA: Diagnosis not present

## 2020-03-31 DIAGNOSIS — Z9861 Coronary angioplasty status: Secondary | ICD-10-CM | POA: Diagnosis not present

## 2020-03-31 DIAGNOSIS — Z8711 Personal history of peptic ulcer disease: Secondary | ICD-10-CM | POA: Diagnosis not present

## 2020-03-31 DIAGNOSIS — I2510 Atherosclerotic heart disease of native coronary artery without angina pectoris: Secondary | ICD-10-CM | POA: Diagnosis present

## 2020-03-31 DIAGNOSIS — T502X5A Adverse effect of carbonic-anhydrase inhibitors, benzothiadiazides and other diuretics, initial encounter: Secondary | ICD-10-CM | POA: Diagnosis present

## 2020-03-31 DIAGNOSIS — N179 Acute kidney failure, unspecified: Secondary | ICD-10-CM

## 2020-03-31 DIAGNOSIS — R109 Unspecified abdominal pain: Secondary | ICD-10-CM | POA: Diagnosis present

## 2020-03-31 DIAGNOSIS — I130 Hypertensive heart and chronic kidney disease with heart failure and stage 1 through stage 4 chronic kidney disease, or unspecified chronic kidney disease: Secondary | ICD-10-CM | POA: Diagnosis present

## 2020-03-31 DIAGNOSIS — N189 Chronic kidney disease, unspecified: Secondary | ICD-10-CM | POA: Diagnosis not present

## 2020-03-31 DIAGNOSIS — E1165 Type 2 diabetes mellitus with hyperglycemia: Secondary | ICD-10-CM | POA: Diagnosis present

## 2020-03-31 DIAGNOSIS — Z87891 Personal history of nicotine dependence: Secondary | ICD-10-CM | POA: Diagnosis not present

## 2020-03-31 DIAGNOSIS — Z20822 Contact with and (suspected) exposure to covid-19: Secondary | ICD-10-CM | POA: Diagnosis present

## 2020-03-31 DIAGNOSIS — Z794 Long term (current) use of insulin: Secondary | ICD-10-CM

## 2020-03-31 DIAGNOSIS — R933 Abnormal findings on diagnostic imaging of other parts of digestive tract: Secondary | ICD-10-CM | POA: Diagnosis not present

## 2020-03-31 DIAGNOSIS — M25511 Pain in right shoulder: Secondary | ICD-10-CM

## 2020-03-31 DIAGNOSIS — K219 Gastro-esophageal reflux disease without esophagitis: Secondary | ICD-10-CM | POA: Diagnosis present

## 2020-03-31 LAB — URINALYSIS, COMPLETE (UACMP) WITH MICROSCOPIC
Bilirubin Urine: NEGATIVE
Glucose, UA: NEGATIVE mg/dL
Hgb urine dipstick: NEGATIVE
Ketones, ur: NEGATIVE mg/dL
Nitrite: NEGATIVE
Protein, ur: 100 mg/dL — AB
Specific Gravity, Urine: 1.015 (ref 1.005–1.030)
pH: 5 (ref 5.0–8.0)

## 2020-03-31 LAB — COMPREHENSIVE METABOLIC PANEL
ALT: 12 U/L (ref 0–44)
AST: 13 U/L — ABNORMAL LOW (ref 15–41)
Albumin: 4 g/dL (ref 3.5–5.0)
Alkaline Phosphatase: 39 U/L (ref 38–126)
Anion gap: 11 (ref 5–15)
BUN: 62 mg/dL — ABNORMAL HIGH (ref 8–23)
CO2: 23 mmol/L (ref 22–32)
Calcium: 8.8 mg/dL — ABNORMAL LOW (ref 8.9–10.3)
Chloride: 101 mmol/L (ref 98–111)
Creatinine, Ser: 3.66 mg/dL — ABNORMAL HIGH (ref 0.44–1.00)
GFR calc Af Amer: 13 mL/min — ABNORMAL LOW (ref >60)
GFR calc non Af Amer: 11 mL/min — ABNORMAL LOW (ref >60)
Glucose, Bld: 274 mg/dL — ABNORMAL HIGH (ref 70–99)
Potassium: 4.8 mmol/L (ref 3.5–5.1)
Sodium: 135 mmol/L (ref 135–145)
Total Bilirubin: 0.8 mg/dL (ref 0.3–1.2)
Total Protein: 7.9 g/dL (ref 6.5–8.1)

## 2020-03-31 LAB — GLUCOSE, CAPILLARY: Glucose-Capillary: 131 mg/dL — ABNORMAL HIGH (ref 70–99)

## 2020-03-31 LAB — CBC
HCT: 33 % — ABNORMAL LOW (ref 36.0–46.0)
Hemoglobin: 10.5 g/dL — ABNORMAL LOW (ref 12.0–15.0)
MCH: 26.4 pg (ref 26.0–34.0)
MCHC: 31.8 g/dL (ref 30.0–36.0)
MCV: 83.1 fL (ref 80.0–100.0)
Platelets: 415 10*3/uL — ABNORMAL HIGH (ref 150–400)
RBC: 3.97 MIL/uL (ref 3.87–5.11)
RDW: 14.3 % (ref 11.5–15.5)
WBC: 10.7 10*3/uL — ABNORMAL HIGH (ref 4.0–10.5)
nRBC: 0 % (ref 0.0–0.2)

## 2020-03-31 LAB — LIPASE, BLOOD: Lipase: 37 U/L (ref 11–51)

## 2020-03-31 MED ORDER — ATORVASTATIN CALCIUM 20 MG PO TABS
40.0000 mg | ORAL_TABLET | Freq: Every day | ORAL | Status: DC
  Administered 2020-04-01 – 2020-04-04 (×3): 40 mg via ORAL
  Filled 2020-03-31 (×3): qty 2

## 2020-03-31 MED ORDER — PANTOPRAZOLE SODIUM 40 MG IV SOLR
40.0000 mg | Freq: Every day | INTRAVENOUS | Status: DC
  Administered 2020-04-01 – 2020-04-04 (×3): 40 mg via INTRAVENOUS
  Filled 2020-03-31 (×3): qty 40

## 2020-03-31 MED ORDER — DEXTROSE-NACL 5-0.45 % IV SOLN: INTRAVENOUS | Status: DC

## 2020-03-31 MED ORDER — LORATADINE 10 MG PO TABS
10.0000 mg | ORAL_TABLET | Freq: Every day | ORAL | Status: DC
  Administered 2020-04-01 – 2020-04-04 (×4): 10 mg via ORAL
  Filled 2020-03-31 (×4): qty 1

## 2020-03-31 MED ORDER — ALBUTEROL SULFATE (2.5 MG/3ML) 0.083% IN NEBU: 2.5000 mg | INHALATION_SOLUTION | Freq: Four times a day (QID) | RESPIRATORY_TRACT | Status: DC | PRN

## 2020-03-31 MED ORDER — ADULT MULTIVITAMIN W/MINERALS CH
1.0000 | ORAL_TABLET | Freq: Every day | ORAL | Status: DC
  Administered 2020-04-01 – 2020-04-04 (×3): 1 via ORAL
  Filled 2020-03-31 (×3): qty 1

## 2020-03-31 MED ORDER — INSULIN ASPART 100 UNIT/ML ~~LOC~~ SOLN
0.0000 [IU] | SUBCUTANEOUS | Status: DC
  Administered 2020-03-31: 2 [IU] via SUBCUTANEOUS
  Administered 2020-04-01: 8 [IU] via SUBCUTANEOUS
  Administered 2020-04-01: 5 [IU] via SUBCUTANEOUS
  Administered 2020-04-01 (×2): 2 [IU] via SUBCUTANEOUS
  Administered 2020-04-02: 5 [IU] via SUBCUTANEOUS
  Administered 2020-04-02: 3 [IU] via SUBCUTANEOUS
  Administered 2020-04-02: 2 [IU] via SUBCUTANEOUS
  Administered 2020-04-02: 8 [IU] via SUBCUTANEOUS
  Administered 2020-04-02: 2 [IU] via SUBCUTANEOUS
  Administered 2020-04-03: 8 [IU] via SUBCUTANEOUS
  Administered 2020-04-03: 3 [IU] via SUBCUTANEOUS
  Administered 2020-04-03 (×2): 5 [IU] via SUBCUTANEOUS
  Administered 2020-04-03: 3 [IU] via SUBCUTANEOUS
  Administered 2020-04-04 (×3): 5 [IU] via SUBCUTANEOUS
  Filled 2020-03-31 (×19): qty 1

## 2020-03-31 MED ORDER — SODIUM CHLORIDE 0.9 % IV BOLUS
500.0000 mL | Freq: Once | INTRAVENOUS | Status: AC
  Administered 2020-03-31: 500 mL via INTRAVENOUS

## 2020-03-31 MED ORDER — HEPARIN SODIUM (PORCINE) 5000 UNIT/ML IJ SOLN
5000.0000 [IU] | Freq: Three times a day (TID) | INTRAMUSCULAR | Status: DC
  Administered 2020-03-31 – 2020-04-04 (×11): 5000 [IU] via SUBCUTANEOUS
  Filled 2020-03-31 (×11): qty 1

## 2020-03-31 MED ORDER — AMLODIPINE BESYLATE 10 MG PO TABS
10.0000 mg | ORAL_TABLET | Freq: Every day | ORAL | Status: DC
  Administered 2020-04-01 – 2020-04-04 (×4): 10 mg via ORAL
  Filled 2020-03-31 (×4): qty 1

## 2020-03-31 MED ORDER — SACCHAROMYCES BOULARDII 250 MG PO CAPS
250.0000 mg | ORAL_CAPSULE | Freq: Two times a day (BID) | ORAL | Status: DC
  Administered 2020-03-31 – 2020-04-04 (×7): 250 mg via ORAL
  Filled 2020-03-31 (×8): qty 1

## 2020-03-31 MED ORDER — ASPIRIN EC 81 MG PO TBEC
81.0000 mg | DELAYED_RELEASE_TABLET | Freq: Every day | ORAL | Status: DC
  Administered 2020-04-01 – 2020-04-04 (×4): 81 mg via ORAL
  Filled 2020-03-31 (×4): qty 1

## 2020-03-31 MED ORDER — ACETAMINOPHEN 325 MG PO TABS
650.0000 mg | ORAL_TABLET | Freq: Four times a day (QID) | ORAL | Status: DC | PRN
  Administered 2020-04-01 – 2020-04-02 (×3): 650 mg via ORAL
  Filled 2020-03-31 (×3): qty 2

## 2020-03-31 MED ORDER — NITROGLYCERIN 0.4 MG SL SUBL: 0.4000 mg | SUBLINGUAL_TABLET | SUBLINGUAL | Status: DC | PRN

## 2020-03-31 MED ORDER — PANTOPRAZOLE SODIUM 40 MG IV SOLR
40.0000 mg | Freq: Once | INTRAVENOUS | Status: AC
  Administered 2020-03-31: 40 mg via INTRAVENOUS
  Filled 2020-03-31: qty 40

## 2020-03-31 NOTE — ED Provider Notes (Signed)
[facility] Emergency Department Provider Note  ____________________________________________   First MD Initiated Contact with Patient 03/31/20 1456     (approximate)  I have reviewed the triage vital signs and the nursing notes.   HISTORY  Chief Complaint Abdominal Pain    HPI ELLARAE NEVITT is a 79 y.o. female with hypertension, hyperlipidemia, CKD, CHF who comes in with abdominal pain.  Patient reports over the past 5 days she is had worsening lower abdominal pain.  The pain has been constant, nothing makes it better, nothing makes it worse.  Has tried Alka-Seltzer and Pepto-Bismol.  Denies having this previously.  No nausea, vomiting, diarrhea.  States she is urinating normally.          Past Medical History:  Diagnosis Date  . Aortic valve stenosis   . Carotid bruit   . CHF (congestive heart failure) ([place])   . Chronic kidney disease   . Diabetes mellitus without complication ([place])   . GERD (gastroesophageal reflux disease)   . Heart murmur   . Hyperlipidemia   . Hypertension   . Pneumonia   . Vitamin D deficiency     Patient Active Problem List   Diagnosis Date Noted  . Chest pain 03/19/2020  . Acute renal failure superimposed on stage 3a chronic kidney disease ([place]) 03/19/2020  . CAD (coronary artery disease) 03/19/2020  . Chronic systolic CHF (congestive heart failure) ([place]) 03/19/2020  . Leukocytosis 03/19/2020  . Acute exacerbation of CHF (congestive heart failure) ([place]) 02/01/2020  . Elevated troponin 02/01/2020  . Reflux esophagitis   . CLE (columnar lined esophagus)   . Duodenitis 06/25/2019  . Chronic heart failure with preserved ejection fraction (HFpEF) ([place]) 02/26/2019  . Benign essential HTN 01/17/2019  . DM (diabetes mellitus) ([place]) 01/17/2019  . Type II diabetes mellitus with renal manifestations ([place]) 07/24/2015  . GERD without esophagitis 07/24/2015  . Hyperlipidemia 07/24/2015    Past Surgical History:    Procedure Laterality Date  . COLONOSCOPY    . COLONOSCOPY WITH PROPOFOL N/A 08/02/2019   Procedure: COLONOSCOPY WITH PROPOFOL;  Surgeon: Virgel Manifold, MD;  Location: [facility] ENDOSCOPY;  Service: Endoscopy;  Laterality: N/A;  . CORONARY ANGIOPLASTY    . ESOPHAGOGASTRODUODENOSCOPY N/A 06/26/2019   Procedure: ESOPHAGOGASTRODUODENOSCOPY (EGD);  Surgeon: Virgel Manifold, MD;  Location: [facility] ENDOSCOPY;  Service: Endoscopy;  Laterality: N/A;  . ESOPHAGOGASTRODUODENOSCOPY (EGD) WITH PROPOFOL N/A 08/02/2019   Procedure: ESOPHAGOGASTRODUODENOSCOPY (EGD) WITH PROPOFOL;  Surgeon: Virgel Manifold, MD;  Location: [facility] ENDOSCOPY;  Service: Endoscopy;  Laterality: N/A;  . LEFT HEART CATH AND CORONARY ANGIOGRAPHY N/A 01/23/2019   Procedure: LEFT HEART CATH AND CORONARY ANGIOGRAPHY;  Surgeon: Yolonda Kida, MD;  Location: [employer] CV LAB;  Service: Cardiovascular;  Laterality: N/A;  . ovarian cyst removal      Prior to Admission medications   Medication Sig Start Date End Date Taking? Authorizing Provider  acetaminophen (TYLENOL) 325 MG tablet Take 2 tablets (650 mg total) by mouth every 6 (six) hours as needed for mild pain (or Fever >/= 101). 06/26/19   Gouru, Illene Silver, MD  albuterol (VENTOLIN HFA) 108 (90 Base) MCG/ACT inhaler Inhale 2 puffs into the lungs every 6 (six) hours as needed for wheezing.    [provider]  amLODipine (NORVASC) 10 MG tablet Take 10 mg by mouth daily.    [provider]  aspirin EC 81 MG EC tablet Take 1 tablet (81 mg total) by mouth daily. 06/27/19   Gouru, Illene Silver,  MD  atorvastatin (LIPITOR) 40 MG tablet Take 40 mg by mouth daily.  07/04/18   [provider]  cetirizine (ZYRTEC) 10 MG tablet Take 10 mg by mouth daily.     [provider]  Choline Fenofibrate 45 MG capsule Take 45 mg by mouth daily.     [provider]  furosemide (LASIX) 40 MG tablet Take 40 mg by mouth 2 (two) times daily.    [provider]   glipiZIDE (GLUCOTROL XL) 10 MG 24 hr tablet Take 10 mg by mouth 2 (two) times daily.    [provider]  hydrALAZINE (APRESOLINE) 25 MG tablet Take 25 mg by mouth 3 (three) times daily.     [provider]  isosorbide mononitrate (IMDUR) 30 MG 24 hr tablet Take 30 mg by mouth 3 (three) times daily.     [provider]  LANTUS SOLOSTAR 100 UNIT/ML Solostar Pen Inject 16 Units into the skin at bedtime. 10/19/19   [provider]  losartan (COZAAR) 100 MG tablet Take 100 mg by mouth daily.    [provider]  metFORMIN (GLUCOPHAGE) 1000 MG tablet Take 1,000 mg by mouth 2 (two) times daily with a meal.     [provider]  metoprolol tartrate (LOPRESSOR) 50 MG tablet Take 50 mg by mouth 2 (two) times daily.    [provider]  Multiple Vitamin (MULTIVITAMIN WITH MINERALS) TABS tablet Take 1 tablet by mouth daily.    [provider]  nitroGLYCERIN (NITROSTAT) 0.4 MG SL tablet Place 1 tablet (0.4 mg total) under the tongue every 5 (five) minutes as needed for chest pain. 03/20/20   Ezekiel Slocumb, DO  pantoprazole (PROTONIX) 40 MG tablet Take 1 tablet (40 mg total) by mouth 2 (two) times daily. 06/26/19   Nicholes Mango, MD  saccharomyces boulardii (FLORASTOR) 250 MG capsule Take 250 mg by mouth 2 (two) times daily.    [provider]  sitaGLIPtin (JANUVIA) 100 MG tablet Take 100 mg by mouth daily.    [provider]  spironolactone (ALDACTONE) 25 MG tablet Take 25 mg by mouth 2 (two) times daily. 02/07/20   [provider]    Allergies Accupril [quinapril hcl]  Family History  Problem Relation Age of Onset  . Diabetes Neg Hx   . Hypertension Neg Hx   . Breast cancer Neg Hx     Social History Social History   Tobacco Use  . Smoking status: Former Smoker    Packs/day: 1.00    Years: 40.00    Pack years: 40.00    Types: Cigarettes    Quit date: 07/27/2018    Years since quitting: 1.6  .  Smokeless tobacco: Never Used  Substance Use Topics  . Alcohol use: No  . Drug use: No      Review of Systems Constitutional: No fever/chills Eyes: No visual changes. ENT: No sore throat. Cardiovascular: Denies chest pain. Respiratory: Denies shortness of breath. Gastrointestinal: Positive abdominal pain no nausea, no vomiting.  No diarrhea.  No constipation. Genitourinary: Negative for dysuria. Musculoskeletal: Negative for back pain. Skin: Negative for rash. Neurological: Negative for headaches, focal weakness or numbness. All other ROS negative ____________________________________________   PHYSICAL EXAM:  VITAL SIGNS: ED Triage Vitals  Enc Vitals Group     BP 03/31/20 1401 (!) 112/43     Pulse Rate 03/31/20 1401 63     Resp 03/31/20 1401 18     Temp 03/31/20 1401 98.5 F (36.9 C)  Temp Source 03/31/20 1401 Oral     SpO2 03/31/20 1401 98 %     Weight 03/31/20 1401 170 lb (77.1 kg)     Height 03/31/20 1401 5\' 7"  (1.702 m)     Head Circumference --      Peak Flow --      Pain Score 03/31/20 1404 6     Pain Loc --      Pain Edu? --      Excl. in [place]? --     Constitutional: Alert and oriented. Well appearing and in no acute distress. Eyes: Conjunctivae are normal. EOMI. Head: Atraumatic. Nose: No congestion/rhinnorhea. Mouth/Throat: Mucous membranes are moist.   Neck: No stridor. Trachea Midline. FROM Cardiovascular: Normal rate, regular rhythm. Grossly normal heart sounds.  Good peripheral circulation. Respiratory: Normal respiratory effort.  No retractions. Lungs CTAB. Gastrointestinal: Tender in the lower abdomen without any rebound or guarding.  No distention. No abdominal bruits.  Musculoskeletal: No lower extremity tenderness nor edema.  No joint effusions. Neurologic:  Normal speech and language. No gross focal neurologic deficits are appreciated.  Skin:  Skin is warm, dry and intact. No rash noted. Psychiatric: Mood and affect are normal. Speech and  behavior are normal. GU: Deferred   ____________________________________________   LABS (all labs ordered are listed, but only abnormal results are displayed)  Labs Reviewed  COMPREHENSIVE METABOLIC PANEL - Abnormal; Notable for the following components:      Result Value   Glucose, Bld 274 (*)    BUN 62 (*)    Creatinine, Ser 3.66 (*)    Calcium 8.8 (*)    AST 13 (*)    GFR calc non Af Amer 11 (*)    GFR calc Af Amer 13 (*)    All other components within normal limits  CBC - Abnormal; Notable for the following components:   WBC 10.7 (*)    Hemoglobin 10.5 (*)    HCT 33.0 (*)    Platelets 415 (*)    All other components within normal limits  URINALYSIS, COMPLETE (UACMP) WITH MICROSCOPIC - Abnormal; Notable for the following components:   Color, Urine YELLOW (*)    APPearance HAZY (*)    Protein, ur 100 (*)    Leukocytes,Ua SMALL (*)    Bacteria, UA RARE (*)    All other components within normal limits  LIPASE, BLOOD   ____________________________________________   RADIOLOGY   Official radiology report(s): CT ABDOMEN PELVIS WO CONTRAST  Result Date: 03/31/2020 CLINICAL DATA:  Abdominal distension, lower abdominal pain EXAM: CT ABDOMEN AND PELVIS WITHOUT CONTRAST TECHNIQUE: Multidetector CT imaging of the abdomen and pelvis was performed following the standard protocol without IV contrast. COMPARISON:  06/24/2019 FINDINGS: Lower chest: No acute abnormality. Hepatobiliary: No focal liver abnormality is seen. Cholelithiasis. No biliary ductal dilatation. Pancreas: Unremarkable. No pancreatic ductal dilatation or surrounding inflammatory changes. Spleen: Normal in size without focal abnormality. Adrenals/Urinary Tract: Adrenal glands are unremarkable. Kidneys are normal, without renal calculi, focal lesion, or hydronephrosis. Bladder is unremarkable. Stomach/Bowel: Severe bowel wall thickening of the first portion of the duodenum with surrounding inflammatory changes which may be  related to a duodenal ulcer or severe duodenitis secondary to an infectious or inflammatory etiology. No extraluminal gas to suggest perforation. No bowel dilatation to suggest obstruction. Vascular/Lymphatic: Normal caliber abdominal aorta with severe atherosclerosis. No lymphadenopathy. Reproductive: Uterus and bilateral adnexa are unremarkable. Other: No abdominal wall hernia or abnormality. No abdominopelvic ascites. Musculoskeletal: Degenerative disease with disc height loss at  L5-S1 with bilateral facet arthropathy and bilateral foraminal stenosis. Grade 1 anterolisthesis of L4 on L5 secondary to facet disease. IMPRESSION: 1. Severe bowel wall thickening of the first portion of the duodenum with surrounding inflammatory changes which may be related to a duodenal ulcer or severe duodenitis secondary to an infectious or inflammatory etiology. No extraluminal gas to suggest perforation. 2.  Aortic Atherosclerosis (ICD10-I70.0). Electronically Signed   By: Kathreen Devoid   On: 03/31/2020 16:24    ____________________________________________   PROCEDURES  Procedure(s) performed (including Critical Care):  Procedures   ____________________________________________   INITIAL IMPRESSION / ASSESSMENT AND PLAN / ED COURSE  AJAH VANHOOSE was evaluated in Emergency Department on 03/31/2020 for the symptoms described in the history of present illness. She was evaluated in the context of the global COVID-19 pandemic, which necessitated consideration that the patient might be at risk for infection with the SARS-CoV-2 virus that causes COVID-19. Institutional protocols and algorithms that pertain to the evaluation of patients at risk for COVID-19 are in a state of rapid change based on information released by regulatory bodies including the CDC and federal and state organizations. These policies and algorithms were followed during the patient's care in the ED.    Patient is a 79 year old who comes in with  lower abdominal pain.  Will get labs to evaluate for electrolyte abnormalities, AKI, UTI.  Will get CT scan to make sure no evidence of diverticulitis, perforation, SBO.  Patient's labs are notable for increasing creatinine from 1.6  33-month ago to 2.2  13 days ago now up to 3.66. Glucose is elevated but no evidence of DKA White count slightly elevated at 10.7 Hemoglobin stable at baseline  CT scan is concerning for duodenal ulcer versus severe duodenitis.  Discussed with GI Dr. Vicente Males who stated usually these are from a peptic ulcer and rarely infectious.  Can hold off on antibiotics and can start on a PPI.  Can do a stool study for H. Pylori.  Creatinine has been significantly increasing but was normal back in January and now is 3.66.  Patient is also Lasix I wonder she just be overdiuresis.  She had no edema in her legs and no shortness of breath.  We will give her a small fluid bolus to see if this helps improve it.  Given patient significantly rising creatinine in the setting of this new duodenitis seen on CT scan will discuss with the hospital team for admission  ____________________________________________   FINAL CLINICAL IMPRESSION(S) / ED DIAGNOSES   Final diagnoses:  Duodenitis  AKI (acute kidney injury) ([place])      MEDICATIONS GIVEN DURING THIS VISIT:  Medications  pantoprazole (PROTONIX) injection 40 mg (has no administration in time range)  sodium chloride 0.9 % bolus 500 mL (has no administration in time range)     ED Discharge Orders    None       Note:  This document was prepared using Dragon voice recognition software and may include unintentional dictation errors.   Vanessa Aldrich, MD 03/31/20 [phone]

## 2020-03-31 NOTE — H&P (Signed)
History and Physical    Brittney [name] XBD:[id number] DOB: 11-07-1941 DOA: 03/31/2020  PCP: Elisabeth Cara, NP  Patient coming from: Home   Chief Complaint: Abdominal pain  HPI: Brittney [name] is a 79 y.o. female with medical history significant of  Brittney [name] is a 79 y.o. female with medical history significant of hypertension, hyperlipidemia, diabetes mellitus, GERD, sCHF with EF of 45-50%, CAD with being 60 to 65% on echo done on 3/21 presents complaining of abdominal pain which started 5 days ago.  Abdominal pain is located at the lower quadrants.  No nausea or vomiting.  Has tolerated her p.o. intake including liquid intake.  Denies any fever, chills, or any other symptoms.  ED Course: In ED she had a CT of abdomen and pelvis revealing Severe bowel wall thickening of the first portion of the duodenum with surrounding inflammatory changes which may be related to a duodenal ulcer or severe duodenitis secondary to an infectious or inflammatory etiology. No extraluminal gas to suggest perforation.  Dr. Morey Hummingbird ED Dr. Damaris Schooner to GI Dr. Vicente Males who stated usually these are from peptic ulcer and rarely infectious.  Can hold off on antibiotics and can start PPI.  Also obtain stool study for H. Pylori.  Creatinine at 3.66, glucose 274, WBC 10.7, hemoglobin 10.5 hematocrit 33.0   Review of Systems: All systems reviewed and otherwise negative.    Past Medical History:  Diagnosis Date  . Aortic valve stenosis   . Carotid bruit   . CHF (congestive heart failure) ([place])   . Chronic kidney disease   . Diabetes mellitus without complication ([place])   . GERD (gastroesophageal reflux disease)   . Heart murmur   . Hyperlipidemia   . Hypertension   . Pneumonia   . Vitamin D deficiency     Past Surgical History:  Procedure Laterality Date  . COLONOSCOPY    . COLONOSCOPY WITH PROPOFOL N/A 08/02/2019   Procedure: COLONOSCOPY WITH PROPOFOL;  Surgeon: Virgel Manifold, MD;  Location: [facility]  ENDOSCOPY;  Service: Endoscopy;  Laterality: N/A;  . CORONARY ANGIOPLASTY    . ESOPHAGOGASTRODUODENOSCOPY N/A 06/26/2019   Procedure: ESOPHAGOGASTRODUODENOSCOPY (EGD);  Surgeon: Virgel Manifold, MD;  Location: [facility] ENDOSCOPY;  Service: Endoscopy;  Laterality: N/A;  . ESOPHAGOGASTRODUODENOSCOPY (EGD) WITH PROPOFOL N/A 08/02/2019   Procedure: ESOPHAGOGASTRODUODENOSCOPY (EGD) WITH PROPOFOL;  Surgeon: Virgel Manifold, MD;  Location: [facility] ENDOSCOPY;  Service: Endoscopy;  Laterality: N/A;  . LEFT HEART CATH AND CORONARY ANGIOGRAPHY N/A 01/23/2019   Procedure: LEFT HEART CATH AND CORONARY ANGIOGRAPHY;  Surgeon: Yolonda Kida, MD;  Location: [place] [facility];  Service: Cardiovascular;  Laterality: N/A;  . ovarian cyst removal       reports that she quit smoking about 20 months ago. Her smoking use included cigarettes. She has a 40.00 pack-year smoking history. She has never used smokeless tobacco. She reports that she does not drink alcohol or use drugs.  Allergies  Allergen Reactions  . Accupril [Quinapril Hcl] Other (See Comments)    Reaction: unknown    Family History  Problem Relation Age of Onset  . Diabetes Neg Hx   . Hypertension Neg Hx   . Breast cancer Neg Hx      Prior to Admission medications   Medication Sig Start Date End Date Taking? Authorizing Provider  acetaminophen (TYLENOL) 325 MG tablet Take 2 tablets (650 mg total) by mouth every 6 (six) hours as needed for mild pain (or Fever >/= 101). 06/26/19  Gouru, Illene Silver, MD  albuterol (VENTOLIN HFA) 108 (90 Base) MCG/ACT inhaler Inhale 2 puffs into the lungs every 6 (six) hours as needed for wheezing.    [provider]  amLODipine (NORVASC) 10 MG tablet Take 10 mg by mouth daily.    [provider]  aspirin EC 81 MG EC tablet Take 1 tablet (81 mg total) by mouth daily. 06/27/19   Gouru, Illene Silver, MD  atorvastatin (LIPITOR) 40 MG tablet Take 40 mg by mouth daily.  07/04/18   [provider]   cetirizine (ZYRTEC) 10 MG tablet Take 10 mg by mouth daily.     [provider]  Choline Fenofibrate 45 MG capsule Take 45 mg by mouth daily.     [provider]  furosemide (LASIX) 40 MG tablet Take 40 mg by mouth 2 (two) times daily.    [provider]  glipiZIDE (GLUCOTROL XL) 10 MG 24 hr tablet Take 10 mg by mouth 2 (two) times daily.    [provider]  hydrALAZINE (APRESOLINE) 25 MG tablet Take 25 mg by mouth 3 (three) times daily.     [provider]  isosorbide mononitrate (IMDUR) 30 MG 24 hr tablet Take 30 mg by mouth 3 (three) times daily.     [provider]  LANTUS SOLOSTAR 100 UNIT/ML Solostar Pen Inject 16 Units into the skin at bedtime. 10/19/19   [provider]  losartan (COZAAR) 100 MG tablet Take 100 mg by mouth daily.    [provider]  metFORMIN (GLUCOPHAGE) 1000 MG tablet Take 1,000 mg by mouth 2 (two) times daily with a meal.     [provider]  metoprolol tartrate (LOPRESSOR) 50 MG tablet Take 50 mg by mouth 2 (two) times daily.    [provider]  Multiple Vitamin (MULTIVITAMIN WITH MINERALS) TABS tablet Take 1 tablet by mouth daily.    [provider]  nitroGLYCERIN (NITROSTAT) 0.4 MG SL tablet Place 1 tablet (0.4 mg total) under the tongue every 5 (five) minutes as needed for chest pain. 03/20/20   Ezekiel Slocumb, DO  pantoprazole (PROTONIX) 40 MG tablet Take 1 tablet (40 mg total) by mouth 2 (two) times daily. 06/26/19   Nicholes Mango, MD  saccharomyces boulardii (FLORASTOR) 250 MG capsule Take 250 mg by mouth 2 (two) times daily.    [provider]  sitaGLIPtin (JANUVIA) 100 MG tablet Take 100 mg by mouth daily.    [provider]  spironolactone (ALDACTONE) 25 MG tablet Take 25 mg by mouth 2 (two) times daily. 02/07/20   [provider]    Physical Exam: Vitals:   03/31/20 1401  BP: (!) 112/43  Pulse: 63  Resp: 18  Temp: 98.5 F (36.9  C)  TempSrc: Oral  SpO2: 98%  Weight: 77.1 kg  Height: 5\' 7"  (1.702 m)    Constitutional: NAD, calm, comfortable Vitals:   03/31/20 1401  BP: (!) 112/43  Pulse: 63  Resp: 18  Temp: 98.5 F (36.9 C)  TempSrc: Oral  SpO2: 98%  Weight: 77.1 kg  Height: 5\' 7"  (1.702 m)   Eyes: PERRL, lids and conjunctivae normal ENMT:mask on Neck: normal, supple Respiratory: clear to auscultation bilaterally, no wheezing, no crackles. Normal respiratory effort. No accessory muscle use.  Cardiovascular: Regular rate and rhythm, no murmurs / rubs / gallops. No extremity edema.  Abdomen: no tenderness, no masses palpated. No hepatosplenomegaly. Bowel sounds positive.  Musculoskeletal: no clubbing / cyanosis. No edema b/l Skin: warm ,  dry Neurologic: CN 2-12 grossly intact.AAxox3 Psychiatric: Normal judgment and insight.. Normal mood.    Labs on Admission: I have personally reviewed following labs and imaging studies  CBC: Recent Labs  Lab 03/31/20 1402  WBC 10.7*  HGB 10.5*  HCT 33.0*  MCV 83.1  PLT 751*   Basic Metabolic Panel: Recent Labs  Lab 03/31/20 1402  NA 135  K 4.8  CL 101  CO2 23  GLUCOSE 274*  BUN 62*  CREATININE 3.66*  CALCIUM 8.8*   GFR: Estimated Creatinine Clearance: 13.3 mL/min (A) (by C-G formula based on SCr of 3.66 mg/dL (H)). Liver Function Tests: Recent Labs  Lab 03/31/20 1402  AST 13*  ALT 12  ALKPHOS 39  BILITOT 0.8  PROT 7.9  ALBUMIN 4.0   Recent Labs  Lab 03/31/20 1402  LIPASE 37   No results for input(s): AMMONIA in the last 168 hours. Coagulation Profile: No results for input(s): INR, PROTIME in the last 168 hours. Cardiac Enzymes: No results for input(s): CKTOTAL, CKMB, CKMBINDEX, TROPONINI in the last 168 hours. BNP (last 3 results) No results for input(s): PROBNP in the last 8760 hours. HbA1C: No results for input(s): HGBA1C in the last 72 hours. CBG: No results for input(s): GLUCAP in the last 168 hours. Lipid Profile: No  results for input(s): CHOL, HDL, LDLCALC, TRIG, CHOLHDL, LDLDIRECT in the last 72 hours. Thyroid Function Tests: No results for input(s): TSH, T4TOTAL, FREET4, T3FREE, THYROIDAB in the last 72 hours. Anemia Panel: No results for input(s): VITAMINB12, FOLATE, FERRITIN, TIBC, IRON, RETICCTPCT in the last 72 hours. Urine analysis:    Component Value Date/Time   COLORURINE YELLOW (A) 03/31/2020 1457   APPEARANCEUR HAZY (A) 03/31/2020 1457   LABSPEC 1.015 03/31/2020 1457   PHURINE 5.0 03/31/2020 1457   GLUCOSEU NEGATIVE 03/31/2020 1457   HGBUR NEGATIVE 03/31/2020 1457   [place] 03/31/2020 [place] 03/31/2020 1457   PROTEINUR 100 (A) 03/31/2020 1457   NITRITE NEGATIVE 03/31/2020 1457   LEUKOCYTESUR SMALL (A) 03/31/2020 1457    Radiological Exams on Admission: CT ABDOMEN PELVIS WO CONTRAST  Result Date: 03/31/2020 CLINICAL DATA:  Abdominal distension, lower abdominal pain EXAM: CT ABDOMEN AND PELVIS WITHOUT CONTRAST TECHNIQUE: Multidetector CT imaging of the abdomen and pelvis was performed following the standard protocol without IV contrast. COMPARISON:  06/24/2019 FINDINGS: Lower chest: No acute abnormality. Hepatobiliary: No focal liver abnormality is seen. Cholelithiasis. No biliary ductal dilatation. Pancreas: Unremarkable. No pancreatic ductal dilatation or surrounding inflammatory changes. Spleen: Normal in size without focal abnormality. Adrenals/Urinary Tract: Adrenal glands are unremarkable. Kidneys are normal, without renal calculi, focal lesion, or hydronephrosis. Bladder is unremarkable. Stomach/Bowel: Severe bowel wall thickening of the first portion of the duodenum with surrounding inflammatory changes which may be related to a duodenal ulcer or severe duodenitis secondary to an infectious or inflammatory etiology. No extraluminal gas to suggest perforation. No bowel dilatation to suggest obstruction. Vascular/Lymphatic: Normal caliber abdominal aorta with  severe atherosclerosis. No lymphadenopathy. Reproductive: Uterus and bilateral adnexa are unremarkable. Other: No abdominal wall hernia or abnormality. No abdominopelvic ascites. Musculoskeletal: Degenerative disease with disc height loss at L5-S1 with bilateral facet arthropathy and bilateral foraminal stenosis. Grade 1 anterolisthesis of L4 on L5 secondary to facet disease. IMPRESSION: 1. Severe bowel wall thickening of the first portion of the duodenum with surrounding inflammatory changes which may be related to a duodenal ulcer or severe duodenitis secondary to an infectious or inflammatory etiology. No extraluminal gas to suggest perforation. 2.  Aortic Atherosclerosis (ICD10-I70.0). Electronically Signed   By: Kathreen Devoid   On: 03/31/2020 16:24    EKG: none done yet  Assessment/Plan Active Problems:   Abdominal pain   1.Abd pain-duodenal ulcer versus severe duodenitis found on CT We will keep her n.p.o. except meds IV Protonix IV fluids GI consulted  2.A/CKD-worsening.  Possibly due to prerenal.  Will hold spironolactone and Lasix.  Also hold losartan. 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 CAD- asx. Cp free. Will hold beta blk and imdur for now as her bp was low and needs hydration. Resume it in am if bp stable. Continue on statin and aspirin  5.  Essential hypertension-for now continue amlodipine.  Hold metoprolol and hydralazine, hold Lasix, spironolactone, Cozaar Resume beta-blockers if BP stable  6.  Chronic systolic heart failure-echo from March 2021 with normal EF now. Euvolemic Continue to monitor  DVT prophylaxis: heparin Code Status: full  Family Communication: none at bedside  Disposition Plan: back to home  Consults called: GI, Nephrology  Admission status: inpatient , as pt  requires 2 [place] stays for the medical management.    Nolberto Hanlon MD [facility] Pager [phone]  If 7PM-7AM, please contact night-coverage [email or web address] Password [facility]  03/31/2020, 5:16 PM

## 2020-03-31 NOTE — ED Triage Notes (Signed)
Pt c/o lower abdominal pain since last Thursday.  denies any accompanying sx.  No NVD, constipation, urinary sx or fever.  Only c/o pain.  VSS. Color WNL

## 2020-04-01 DIAGNOSIS — R933 Abnormal findings on diagnostic imaging of other parts of digestive tract: Secondary | ICD-10-CM

## 2020-04-01 DIAGNOSIS — E1122 Type 2 diabetes mellitus with diabetic chronic kidney disease: Secondary | ICD-10-CM

## 2020-04-01 LAB — GLUCOSE, CAPILLARY
Glucose-Capillary: 131 mg/dL — ABNORMAL HIGH (ref 70–99)
Glucose-Capillary: 136 mg/dL — ABNORMAL HIGH (ref 70–99)
Glucose-Capillary: 247 mg/dL — ABNORMAL HIGH (ref 70–99)
Glucose-Capillary: 255 mg/dL — ABNORMAL HIGH (ref 70–99)
Glucose-Capillary: 92 mg/dL (ref 70–99)
Glucose-Capillary: 95 mg/dL (ref 70–99)

## 2020-04-01 LAB — BASIC METABOLIC PANEL
Anion gap: 11 (ref 5–15)
BUN: 59 mg/dL — ABNORMAL HIGH (ref 8–23)
CO2: 23 mmol/L (ref 22–32)
Calcium: 8.6 mg/dL — ABNORMAL LOW (ref 8.9–10.3)
Chloride: 105 mmol/L (ref 98–111)
Creatinine, Ser: 3.3 mg/dL — ABNORMAL HIGH (ref 0.44–1.00)
GFR calc Af Amer: 15 mL/min — ABNORMAL LOW (ref >60)
GFR calc non Af Amer: 13 mL/min — ABNORMAL LOW (ref >60)
Glucose, Bld: 102 mg/dL — ABNORMAL HIGH (ref 70–99)
Potassium: 4.1 mmol/L (ref 3.5–5.1)
Sodium: 139 mmol/L (ref 135–145)

## 2020-04-01 LAB — SARS CORONAVIRUS 2 (TAT 6-24 HRS): SARS Coronavirus 2: NEGATIVE

## 2020-04-01 MED ORDER — ONDANSETRON HCL 4 MG PO TABS
4.0000 mg | ORAL_TABLET | Freq: Three times a day (TID) | ORAL | Status: DC | PRN
  Administered 2020-04-01 – 2020-04-04 (×2): 4 mg via ORAL
  Filled 2020-04-01 (×2): qty 1

## 2020-04-01 NOTE — Progress Notes (Signed)
[facility]  ROUNDING NOTE   Subjective:   [name] admitted to [facility] on 03/31/2020 for AKI (acute kidney injury) [facility]) [N17.9] Abdominal pain [R10.9] Duodenitis [K29.80]  Patient complains of left lower quadrant abdominal pain. Found to have duodenal inflammation on imaging.   Nephrology consulted for acute renal failure. Patient had a serum creatinine at Nephrology office on 1.57, GFR of 31.   Patient has been taking her diuretics, losartan and potassium chloride but not eating well.   Objective:  Vital signs in last 24 hours:  Temp:  [97.9 F (36.6 C)-98.1 F (36.7 C)] 97.9 F (36.6 C) (04/06 0440) Pulse Rate:  [56-70] 60 (04/06 1242) Resp:  [18] 18 (04/06 0440) BP: (104-152)/(50-111) 139/53 (04/06 1242) SpO2:  [97 %-100 %] 97 % (04/06 1242)  Weight change:  Filed Weights   03/31/20 1401  Weight: 77.1 kg    Intake/Output: I/O last 3 completed shifts: In: 106 [I.V.:106] Out: 300 [Urine:300]   Intake/Output this shift:  No intake/output data recorded.  Physical Exam: General: NAD,   Head: Normocephalic, atraumatic. Moist oral mucosal membranes  Eyes: Anicteric, PERRL  Neck: Supple, trachea midline  Lungs:  Clear to auscultation  Heart: Regular rate and rhythm  Abdomen:  LLQ tenderness.   Extremities:  no peripheral edema.  Neurologic: Nonfocal, moving all four extremities  Skin: No lesions        Basic Metabolic Panel: Recent Labs  Lab 03/31/20 1402 04/01/20 0446  NA 135 139  K 4.8 4.1  CL 101 105  CO2 23 23  GLUCOSE 274* 102*  BUN 62* 59*  CREATININE 3.66* 3.30*  CALCIUM 8.8* 8.6*    Liver Function Tests: Recent Labs  Lab 03/31/20 1402  AST 13*  ALT 12  ALKPHOS 39  BILITOT 0.8  PROT 7.9  ALBUMIN 4.0   Recent Labs  Lab 03/31/20 1402  LIPASE 37   No results for input(s): AMMONIA in the last 168 hours.  CBC: Recent Labs  Lab 03/31/20 1402  WBC 10.7*  HGB 10.5*  HCT 33.0*  MCV 83.1  PLT 415*     Cardiac Enzymes: No results for input(s): CKTOTAL, CKMB, CKMBINDEX, TROPONINI in the last 168 hours.  BNP: Invalid input(s): POCBNP  CBG: Recent Labs  Lab 03/31/20 2050 04/01/20 0030 04/01/20 0335 04/01/20 0800 04/01/20 1239  GLUCAP 131* 131* 92 95 136*    Microbiology: Results for orders placed or performed during the hospital encounter of 03/31/20  SARS CORONAVIRUS 2 (TAT 6-24 HRS) Nasopharyngeal Nasopharyngeal Swab     Status: None   Collection Time: 03/31/20  5:14 PM   Specimen: Nasopharyngeal Swab  Result Value Ref Range Status   SARS Coronavirus 2 NEGATIVE NEGATIVE Final    Comment: (NOTE) SARS-CoV-2 target nucleic acids are NOT DETECTED. The SARS-CoV-2 RNA is generally detectable in upper and lower respiratory specimens during the acute phase of infection. Negative results do not preclude SARS-CoV-2 infection, do not rule out co-infections with other pathogens, and should not be used as the sole basis for treatment or other patient management decisions. Negative results must be combined with clinical observations, patient history, and epidemiological information. The expected result is Negative. Fact Sheet for Patients: SugarRoll.be Fact Sheet for Healthcare Providers: https://www.woods-mathews.com/ This test is not yet approved or cleared by the [place] FDA and  has been authorized for detection and/or diagnosis of SARS-CoV-2 by FDA under an Emergency Use Authorization (EUA). This EUA will remain  in effect (meaning this test can  be used) for the duration of the COVID-19 declaration under Section 56 4(b)(1) of the Act, 21 U.S.C. section 360bbb-3(b)(1), unless the authorization is terminated or revoked sooner. Performed at [facility], [place] [address] [place], [place] [address]     Coagulation Studies: No results for input(s): LABPROT, INR in the last 72 hours.  Urinalysis: Recent Labs     03/31/20 1457  COLORURINE YELLOW*  LABSPEC 1.015  PHURINE 5.0  GLUCOSEU NEGATIVE  HGBUR NEGATIVE  BILIRUBINUR NEGATIVE  KETONESUR NEGATIVE  PROTEINUR 100*  NITRITE NEGATIVE  LEUKOCYTESUR SMALL*      Imaging: CT ABDOMEN PELVIS WO CONTRAST  Result Date: 03/31/2020 CLINICAL DATA:  Abdominal distension, lower abdominal pain EXAM: CT ABDOMEN AND PELVIS WITHOUT CONTRAST TECHNIQUE: Multidetector CT imaging of the abdomen and pelvis was performed following the standard protocol without IV contrast. COMPARISON:  06/24/2019 FINDINGS: Lower chest: No acute abnormality. Hepatobiliary: No focal liver abnormality is seen. Cholelithiasis. No biliary ductal dilatation. Pancreas: Unremarkable. No pancreatic ductal dilatation or surrounding inflammatory changes. Spleen: Normal in size without focal abnormality. Adrenals/Urinary Tract: Adrenal glands are unremarkable. Kidneys are normal, without renal calculi, focal lesion, or hydronephrosis. Bladder is unremarkable. Stomach/Bowel: Severe bowel wall thickening of the first portion of the duodenum with surrounding inflammatory changes which may be related to a duodenal ulcer or severe duodenitis secondary to an infectious or inflammatory etiology. No extraluminal gas to suggest perforation. No bowel dilatation to suggest obstruction. Vascular/Lymphatic: Normal caliber abdominal aorta with severe atherosclerosis. No lymphadenopathy. Reproductive: Uterus and bilateral adnexa are unremarkable. Other: No abdominal wall hernia or abnormality. No abdominopelvic ascites. Musculoskeletal: Degenerative disease with disc height loss at L5-S1 with bilateral facet arthropathy and bilateral foraminal stenosis. Grade 1 anterolisthesis of L4 on L5 secondary to facet disease. IMPRESSION: 1. Severe bowel wall thickening of the first portion of the duodenum with surrounding inflammatory changes which may be related to a duodenal ulcer or severe duodenitis secondary to an infectious or  inflammatory etiology. No extraluminal gas to suggest perforation. 2.  Aortic Atherosclerosis (ICD10-I70.0). Electronically Signed   By: Kathreen Devoid   On: 03/31/2020 16:24     Medications:   . dextrose 5 % and 0.45% NaCl 50 mL/hr at 04/01/20 1344   . amLODipine  10 mg Oral Daily  . aspirin EC  81 mg Oral Daily  . atorvastatin  40 mg Oral Daily  . heparin  5,000 Units Subcutaneous Q8H  . insulin aspart  0-15 Units Subcutaneous Q4H  . loratadine  10 mg Oral Daily  . multivitamin with minerals  1 tablet Oral Daily  . pantoprazole (PROTONIX) IV  40 mg Intravenous Daily  . saccharomyces boulardii  250 mg Oral BID   acetaminophen, albuterol, nitroGLYCERIN, ondansetron  Assessment/ Plan:  [name] is a 79 y.o. black female with hypertension, systolic congestive heart failure, hyperlipidemia, diabetes mellitus type II insulin dependent, aortic valve stenosis, who is admitted to [facility] on 03/31/2020 for AKI (acute kidney injury) ([place]) [N17.9] Abdominal pain [R10.9] Duodenitis [K29.80]   1. 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  Acute renal failure secondary to overdiuresis and poor po intake.  - holding home regimen of furosemide, spironolactone, and losartan.  - gentle IV fluids  2. Hypertension: well controlled. Holding agents as above.  - Continue amlodipine   LOS: 1 Tammye Kahler 4/6/20213:02 PM

## 2020-04-01 NOTE — Progress Notes (Signed)
PROGRESS NOTE    Brittney [name]  HER:[id number] DOB: 04-03-1941 DOA: 03/31/2020 PCP: Elisabeth Cara, NP    Brief Narrative:  Brittney [name] is a 79 y.o. female with medical history significant of  Brittney [name] a 79 y.o.femalewith medical history significant ofhypertension, hyperlipidemia, diabetes mellitus, GERD,sCHF with EF of 45-50%, CAD with being 60 to 65% on echo done on 3/21 presents complaining of abdominal pain which started 5 days ago.  Abdominal pain is located at the lower quadrants.  No nausea or vomiting.  Has tolerated her p.o. intake including liquid intake.  Denies any fever, chills, or any other symptoms.  Had a CT of the abdomen that revealed severe duodenitis.  GI consult    Consultants:   GI  Procedures: ct  Antimicrobials:      Subjective: Abdominal pain resolved this AM.  Has some nausea now.  No vomiting.  Objective: Vitals:   03/31/20 1900 03/31/20 1930 03/31/20 2054 04/01/20 0440  BP: (!) 143/71 (!) 146/57 (!) 114/59 (!) 104/50  Pulse: (!) 58 70 65 (!) 56  Resp:   18 18  Temp:   98.1 F (36.7 C) 97.9 F (36.6 C)  TempSrc:   Oral Oral  SpO2: 98% 97% 100% 97%  Weight:      Height:        Intake/Output Summary (Last 24 hours) at 04/01/2020 0725 Last data filed at 04/01/2020 0300 Gross per 24 hour  Intake 106 ml  Output 300 ml  Net -194 ml   Filed Weights   03/31/20 1401  Weight: 77.1 kg    Examination:  General exam: Appears calm and comfortable  Respiratory system: Clear to auscultation. Respiratory effort normal. Cardiovascular system: S1 & S2 heard, RRR. No JVD, murmurs, rubs, gallops or clicks.  Gastrointestinal system: Abdomen is nondistended, soft and nontender.Normal bowel sounds heard. Central nervous system: Alert and oriented. No focal neurological deficits. Extremities: No edema Skin: Warm dry Psychiatry: Judgement and insight appear normal. Mood & affect appropriate.     Data Reviewed: I have personally  reviewed following labs and imaging studies  CBC: Recent Labs  Lab 03/31/20 1402  WBC 10.7*  HGB 10.5*  HCT 33.0*  MCV 83.1  PLT 856*   Basic Metabolic Panel: Recent Labs  Lab 03/31/20 1402 04/01/20 0446  NA 135 139  K 4.8 4.1  CL 101 105  CO2 23 23  GLUCOSE 274* 102*  BUN 62* 59*  CREATININE 3.66* 3.30*  CALCIUM 8.8* 8.6*   GFR: Estimated Creatinine Clearance: 14.8 mL/min (A) (by C-G formula based on SCr of 3.3 mg/dL (H)). Liver Function Tests: Recent Labs  Lab 03/31/20 1402  AST 13*  ALT 12  ALKPHOS 39  BILITOT 0.8  PROT 7.9  ALBUMIN 4.0   Recent Labs  Lab 03/31/20 1402  LIPASE 37   No results for input(s): AMMONIA in the last 168 hours. Coagulation Profile: No results for input(s): INR, PROTIME in the last 168 hours. Cardiac Enzymes: No results for input(s): CKTOTAL, CKMB, CKMBINDEX, TROPONINI in the last 168 hours. BNP (last 3 results) No results for input(s): PROBNP in the last 8760 hours. HbA1C: No results for input(s): HGBA1C in the last 72 hours. CBG: Recent Labs  Lab 03/31/20 2050 04/01/20 0030 04/01/20 0335  GLUCAP 131* 131* 92   Lipid Profile: No results for input(s): CHOL, HDL, LDLCALC, TRIG, CHOLHDL, LDLDIRECT in the last 72 hours. Thyroid Function Tests: No results for input(s): TSH, T4TOTAL, FREET4, T3FREE, THYROIDAB in the last  72 hours. Anemia Panel: No results for input(s): VITAMINB12, FOLATE, FERRITIN, TIBC, IRON, RETICCTPCT in the last 72 hours. Sepsis Labs: No results for input(s): PROCALCITON, LATICACIDVEN in the last 168 hours.  Recent Results (from the past 240 hour(s))  SARS CORONAVIRUS 2 (TAT 6-24 HRS) Nasopharyngeal Nasopharyngeal Swab     Status: None   Collection Time: 03/31/20  5:14 PM   Specimen: Nasopharyngeal Swab  Result Value Ref Range Status   SARS Coronavirus 2 NEGATIVE NEGATIVE Final    Comment: (NOTE) SARS-CoV-2 target nucleic acids are NOT DETECTED. The SARS-CoV-2 RNA is generally detectable in upper  and lower respiratory specimens during the acute phase of infection. Negative results do not preclude SARS-CoV-2 infection, do not rule out co-infections with other pathogens, and should not be used as the sole basis for treatment or other patient management decisions. Negative results must be combined with clinical observations, patient history, and epidemiological information. The expected result is Negative. Fact Sheet for Patients: SugarRoll.be Fact Sheet for Healthcare Providers: https://www.woods-mathews.com/ This test is not yet approved or cleared by the [place] FDA and  has been authorized for detection and/or diagnosis of SARS-CoV-2 by FDA under an Emergency Use Authorization (EUA). This EUA will remain  in effect (meaning this test can be used) for the duration of the COVID-19 declaration under Section 56 4(b)(1) of the Act, 21 U.S.C. section 360bbb-3(b)(1), unless the authorization is terminated or revoked sooner. Performed at [facility], [place] [address] [place], [place] [address]          Radiology Studies: CT ABDOMEN PELVIS WO CONTRAST  Result Date: 03/31/2020 CLINICAL DATA:  Abdominal distension, lower abdominal pain EXAM: CT ABDOMEN AND PELVIS WITHOUT CONTRAST TECHNIQUE: Multidetector CT imaging of the abdomen and pelvis was performed following the standard protocol without IV contrast. COMPARISON:  06/24/2019 FINDINGS: Lower chest: No acute abnormality. Hepatobiliary: No focal liver abnormality is seen. Cholelithiasis. No biliary ductal dilatation. Pancreas: Unremarkable. No pancreatic ductal dilatation or surrounding inflammatory changes. Spleen: Normal in size without focal abnormality. Adrenals/Urinary Tract: Adrenal glands are unremarkable. Kidneys are normal, without renal calculi, focal lesion, or hydronephrosis. Bladder is unremarkable. Stomach/Bowel: Severe bowel wall thickening of the first portion of the  duodenum with surrounding inflammatory changes which may be related to a duodenal ulcer or severe duodenitis secondary to an infectious or inflammatory etiology. No extraluminal gas to suggest perforation. No bowel dilatation to suggest obstruction. Vascular/Lymphatic: Normal caliber abdominal aorta with severe atherosclerosis. No lymphadenopathy. Reproductive: Uterus and bilateral adnexa are unremarkable. Other: No abdominal wall hernia or abnormality. No abdominopelvic ascites. Musculoskeletal: Degenerative disease with disc height loss at L5-S1 with bilateral facet arthropathy and bilateral foraminal stenosis. Grade 1 anterolisthesis of L4 on L5 secondary to facet disease. IMPRESSION: 1. Severe bowel wall thickening of the first portion of the duodenum with surrounding inflammatory changes which may be related to a duodenal ulcer or severe duodenitis secondary to an infectious or inflammatory etiology. No extraluminal gas to suggest perforation. 2.  Aortic Atherosclerosis (ICD10-I70.0). Electronically Signed   By: Kathreen Devoid   On: 03/31/2020 16:24        Scheduled Meds: . amLODipine  10 mg Oral Daily  . aspirin EC  81 mg Oral Daily  . atorvastatin  40 mg Oral Daily  . heparin  5,000 Units Subcutaneous Q8H  . insulin aspart  0-15 Units Subcutaneous Q4H  . loratadine  10 mg Oral Daily  . multivitamin with minerals  1 tablet Oral Daily  . pantoprazole (  PROTONIX) IV  40 mg Intravenous Daily  . saccharomyces boulardii  250 mg Oral BID   Continuous Infusions: . dextrose 5 % and 0.45% NaCl Stopped (03/31/20 2027)    Assessment & Plan:   Active Problems:   Abdominal pain   1.Abd pain-duodenal ulcer/severe duodenitis found on CT We will keep her n.p.o. except meds  Protonix 40 mg p.o. twice daily IV fluids GI input was appreciated EGD aiming for Friday morning  2.A/CKD-worsening.  Possibly due to prerenal due overdiuresis Will hold spironolactone and Lasix.  Also hold  losartan. 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 CAD- asx. Cp free. Will hold beta blk and imdur for now as her bp was low and needs hydration. Resume it in am if bp stable. Continue on statin and aspirin  5.  Essential hypertension-for now continue amlodipine.  Hold metoprolol and hydralazine, hold Lasix, spironolactone, Cozaar Resume beta-blockers if BP stable  6.  Chronic systolic heart failure-echo from March 2021 with normal EF now. Euvolemic Continue to monitor volume status as she is getting gentle hydration  DVT prophylaxis: Heparin Code Status: Full Family Communication: Daughter at bedside updated Disposition Plan: Back home Barrier: Still nauseous.  Needs EGD on Friday       LOS: 1 day   Time spent: 45 minutes with more than 50% COC    Nolberto Hanlon, MD [facility] Pager [phone]  If 7PM-7AM, please contact night-coverage [email or web address] Password [email or web address] 04/01/2020, 7:25 AM

## 2020-04-01 NOTE — Consult Note (Addendum)
Brittney [name] , MD [address] [place], [name], [place], [address] [address], [place], [place], [place], [address] Phone: [phone]  Fax: [phone]  Consultation  Referring Provider:     Dr Kurtis Bushman  Primary Care Physician:  Elisabeth Cara, NP Primary Gastroenterologist:  Dr. Bonna Gains         Reason for Consultation:     Abnormal findings on CT scan.  Date of Admission:  03/31/2020 Date of Consultation:  04/01/2020         HPI:   Brittney [name] is a 79 y.o. female is a patient who has been seen at our practice particularly Dr. Bonna Gains back in July 2020.  At that point of time she was seen for duodenal ulcers seen on the prior EGD.  Had been on a PPI.  H. pylori IgG was positive at that time.  Not treated with any antibiotics.  Plan at that time was to repeat EGD in a couple of months. Appears subsequently EGD was canceled.  She presented to the hospital on 03/31/2020 with abdominal pain.  CT scan of the abdomen showed severe bowel wall thickening of the first portion of the duodenum with surrounding inflammatory changes which may be related to a duodenal ulcer or duodenitis.  On admission noted to be in AKI.  Hb 10.5 g.  She states that she takes [employer] on a regular basis, although not daily but often . Came into the hospital with lower abdominal pain, non radiating , no clear aggravating or releiving factors. Normal brown stool and denies any melena.  Past Medical History:  Diagnosis Date  . Aortic valve stenosis   . Carotid bruit   . CHF (congestive heart failure) ([facility])   . Chronic kidney disease   . Diabetes mellitus without complication ([facility])   . GERD (gastroesophageal reflux disease)   . Heart murmur   . Hyperlipidemia   . Hypertension   . Pneumonia   . Vitamin D deficiency     Past Surgical History:  Procedure Laterality Date  . COLONOSCOPY    . COLONOSCOPY WITH PROPOFOL N/A 08/02/2019   Procedure: COLONOSCOPY WITH PROPOFOL;  Surgeon: Virgel Manifold, MD;  Location: [facility] ENDOSCOPY;  Service: Endoscopy;  Laterality: N/A;  . CORONARY ANGIOPLASTY    . ESOPHAGOGASTRODUODENOSCOPY N/A 06/26/2019   Procedure: ESOPHAGOGASTRODUODENOSCOPY (EGD);  Surgeon: Virgel Manifold, MD;  Location: [facility] ENDOSCOPY;  Service: Endoscopy;  Laterality: N/A;  . ESOPHAGOGASTRODUODENOSCOPY (EGD) WITH PROPOFOL N/A 08/02/2019   Procedure: ESOPHAGOGASTRODUODENOSCOPY (EGD) WITH PROPOFOL;  Surgeon: Virgel Manifold, MD;  Location: [facility] ENDOSCOPY;  Service: Endoscopy;  Laterality: N/A;  . LEFT HEART CATH AND CORONARY ANGIOGRAPHY N/A 01/23/2019   Procedure: LEFT HEART CATH AND CORONARY ANGIOGRAPHY;  Surgeon: Yolonda Kida, MD;  Location: [facility];  Service: Cardiovascular;  Laterality: N/A;  . ovarian cyst removal      Prior to Admission medications   Medication Sig Start Date End Date Taking? Authorizing Provider  acetaminophen (TYLENOL) 325 MG tablet Take 2 tablets (650 mg total) by mouth every 6 (six) hours as needed for mild pain (or Fever >/= 101). 06/26/19  Yes Gouru, Aruna, MD  albuterol (VENTOLIN HFA) 108 (90 Base) MCG/ACT inhaler Inhale 2 puffs into the lungs every 6 (six) hours as needed for wheezing.   Yes [provider]  amLODipine (NORVASC) 10 MG tablet Take 10 mg by mouth daily.   Yes [provider]  aspirin EC 81 MG EC tablet Take  1 tablet (81 mg total) by mouth daily. 06/27/19  Yes Gouru, Illene Silver, MD  aspirin-sod bicarb-citric acid (ALKA-SELTZER) 325 MG TBEF tablet Take 325 mg by mouth every 6 (six) hours as needed.   Yes [provider]  atorvastatin (LIPITOR) 40 MG tablet Take 40 mg by mouth daily.  07/04/18  Yes [provider]  bismuth subsalicylate (PEPTO BISMOL) 262 MG/15ML suspension Take 30 mLs by mouth every 6 (six) hours as needed.   Yes [provider]  cetirizine (ZYRTEC) 10 MG tablet Take 10 mg by mouth daily.    Yes [provider]  Choline Fenofibrate 45 MG capsule  Take 45 mg by mouth daily.    Yes [provider]  furosemide (LASIX) 40 MG tablet Take 40 mg by mouth 2 (two) times daily.   Yes [provider]  glipiZIDE (GLUCOTROL XL) 10 MG 24 hr tablet Take 10 mg by mouth 2 (two) times daily.   Yes [provider]  hydrALAZINE (APRESOLINE) 50 MG tablet Take 50 mg by mouth 3 (three) times daily.   Yes [provider]  isosorbide mononitrate (IMDUR) 30 MG 24 hr tablet Take 30 mg by mouth 3 (three) times daily.    Yes [provider]  LANTUS SOLOSTAR 100 UNIT/ML Solostar Pen Inject 16 Units into the skin at bedtime. 10/19/19  Yes [provider]  metFORMIN (GLUCOPHAGE) 1000 MG tablet Take 1,000 mg by mouth 2 (two) times daily with a meal.    Yes [provider]  metoprolol tartrate (LOPRESSOR) 50 MG tablet Take 50 mg by mouth 2 (two) times daily.   Yes [provider]  Multiple Vitamin (MULTIVITAMIN WITH MINERALS) TABS tablet Take 1 tablet by mouth daily.   Yes [provider]  nitroGLYCERIN (NITROSTAT) 0.4 MG SL tablet Place 1 tablet (0.4 mg total) under the tongue every 5 (five) minutes as needed for chest pain. 03/20/20  Yes Nicole Kindred A, DO  omeprazole (PRILOSEC) 20 MG capsule Take 20 mg by mouth daily.   Yes [provider]  pantoprazole (PROTONIX) 40 MG tablet Take 1 tablet (40 mg total) by mouth 2 (two) times daily. 06/26/19  Yes Gouru, Illene Silver, MD  saccharomyces boulardii (FLORASTOR) 250 MG capsule Take 250 mg by mouth 2 (two) times daily.   Yes [provider]  sitaGLIPtin (JANUVIA) 100 MG tablet Take 100 mg by mouth daily.   Yes [provider]  spironolactone (ALDACTONE) 25 MG tablet Take 12.5 mg by mouth 2 (two) times daily.  02/07/20  Yes [provider]    Family History  Problem Relation Age of Onset  . Diabetes Neg Hx   . Hypertension Neg Hx   . Breast cancer Neg Hx      Social History   Tobacco Use  . Smoking status:  Former Smoker    Packs/day: 1.00    Years: 40.00    Pack years: 40.00    Types: Cigarettes    Quit date: 07/27/2018    Years since quitting: 1.6  . Smokeless tobacco: Never Used  Substance Use Topics  . Alcohol use: No  . Drug use: No    Allergies as of 03/31/2020 - Review Complete 03/31/2020  Allergen Reaction Noted  . Accupril [quinapril hcl] Other (See Comments) 08/01/2015    Review of Systems:    All systems reviewed and negative except where noted in HPI.   Physical Exam:  Vital signs in last 24 hours: Temp:  [97.9 F (36.6 C)-98.5 F (  36.9 C)] 97.9 F (36.6 C) (04/06 0440) Pulse Rate:  [56-70] 56 (04/06 0440) Resp:  [18] 18 (04/06 0440) BP: (104-152)/(43-111) 139/68 (04/06 1100) SpO2:  [97 %-100 %] 97 % (04/06 0440) Weight:  [77.1 kg] 77.1 kg (04/05 1401)   General:   Pleasant, cooperative in NAD Head:  Normocephalic and atraumatic. Eyes:   No icterus.   Conjunctiva pink. PERRLA. Ears:  Normal auditory acuity. Lungs: Respirations even and unlabored. Lungs clear to auscultation bilaterally.   No wheezes, crackles, or rhonchi.  Heart:  Regular rate and rhythm;  Without murmur, clicks, rubs or gallops Abdomen:  Soft, nondistended, nontender. Normal bowel sounds. No appreciable masses or hepatomegaly.  No rebound or guarding.  Neurologic:  Alert and oriented x3;  grossly normal neurologically. Psych:  Alert and cooperative. Normal affect.  LAB RESULTS: Recent Labs    03/31/20 1402  WBC 10.7*  HGB 10.5*  HCT 33.0*  PLT 415*   BMET Recent Labs    03/31/20 1402 04/01/20 0446  NA 135 139  K 4.8 4.1  CL 101 105  CO2 23 23  GLUCOSE 274* 102*  BUN 62* 59*  CREATININE 3.66* 3.30*  CALCIUM 8.8* 8.6*   LFT Recent Labs    03/31/20 1402  PROT 7.9  ALBUMIN 4.0  AST 13*  ALT 12  ALKPHOS 39  BILITOT 0.8   PT/INR No results for input(s): LABPROT, INR in the last 72 hours.  STUDIES: CT ABDOMEN PELVIS WO CONTRAST  Result Date: 03/31/2020 CLINICAL DATA:   Abdominal distension, lower abdominal pain EXAM: CT ABDOMEN AND PELVIS WITHOUT CONTRAST TECHNIQUE: Multidetector CT imaging of the abdomen and pelvis was performed following the standard protocol without IV contrast. COMPARISON:  06/24/2019 FINDINGS: Lower chest: No acute abnormality. Hepatobiliary: No focal liver abnormality is seen. Cholelithiasis. No biliary ductal dilatation. Pancreas: Unremarkable. No pancreatic ductal dilatation or surrounding inflammatory changes. Spleen: Normal in size without focal abnormality. Adrenals/Urinary Tract: Adrenal glands are unremarkable. Kidneys are normal, without renal calculi, focal lesion, or hydronephrosis. Bladder is unremarkable. Stomach/Bowel: Severe bowel wall thickening of the first portion of the duodenum with surrounding inflammatory changes which may be related to a duodenal ulcer or severe duodenitis secondary to an infectious or inflammatory etiology. No extraluminal gas to suggest perforation. No bowel dilatation to suggest obstruction. Vascular/Lymphatic: Normal caliber abdominal aorta with severe atherosclerosis. No lymphadenopathy. Reproductive: Uterus and bilateral adnexa are unremarkable. Other: No abdominal wall hernia or abnormality. No abdominopelvic ascites. Musculoskeletal: Degenerative disease with disc height loss at L5-S1 with bilateral facet arthropathy and bilateral foraminal stenosis. Grade 1 anterolisthesis of L4 on L5 secondary to facet disease. IMPRESSION: 1. Severe bowel wall thickening of the first portion of the duodenum with surrounding inflammatory changes which may be related to a duodenal ulcer or severe duodenitis secondary to an infectious or inflammatory etiology. No extraluminal gas to suggest perforation. 2.  Aortic Atherosclerosis (ICD10-I70.0). Electronically Signed   By: Kathreen Devoid   On: 03/31/2020 16:24      Impression / Plan:   Brittney [name] is a 79 y.o. y/o female with history of duodenal ulcer seen on endoscopy in  June 2020.  She follows with Dr. Bonna Gains as an outpatient.  Plan was for repeat EGD in a couple of months but patient did not follow-up.  H. pylori IgG positive in the past but not been treated.  Presents to the hospital with abdominal pain and CT scan shows findings of severe duodenitis/ulceration.Likely secondary to long term NSAID use (Alka  seltzer often in a week)  Plan 1.  Omeprazole 40 mg p.o. twice daily 2.  Follow-up H. pylori stool antigen 3.  Suggest rehydration and treatment of the AKI subsequently will perform an EGD which we can aim for Thursday morning. 4. No NSAID's - needs to stop- likely worsened AKI   I have discussed alternative options, risks & benefits,  which include, but are not limited to, bleeding, infection, perforation,respiratory complication & drug reaction.  The patient agrees with this plan & written consent will be obtained.      Thank you for involving me in the care of this patient.      LOS: 1 day   Brittney Bellows, MD  04/01/2020, 11:29 AM

## 2020-04-02 ENCOUNTER — Inpatient Hospital Stay: Payer: Medicare HMO

## 2020-04-02 LAB — GLUCOSE, CAPILLARY
Glucose-Capillary: 118 mg/dL — ABNORMAL HIGH (ref 70–99)
Glucose-Capillary: 132 mg/dL — ABNORMAL HIGH (ref 70–99)
Glucose-Capillary: 136 mg/dL — ABNORMAL HIGH (ref 70–99)
Glucose-Capillary: 176 mg/dL — ABNORMAL HIGH (ref 70–99)
Glucose-Capillary: 214 mg/dL — ABNORMAL HIGH (ref 70–99)
Glucose-Capillary: 237 mg/dL — ABNORMAL HIGH (ref 70–99)
Glucose-Capillary: 265 mg/dL — ABNORMAL HIGH (ref 70–99)

## 2020-04-02 LAB — BASIC METABOLIC PANEL
Anion gap: 10 (ref 5–15)
BUN: 45 mg/dL — ABNORMAL HIGH (ref 8–23)
CO2: 22 mmol/L (ref 22–32)
Calcium: 8.8 mg/dL — ABNORMAL LOW (ref 8.9–10.3)
Chloride: 105 mmol/L (ref 98–111)
Creatinine, Ser: 2.78 mg/dL — ABNORMAL HIGH (ref 0.44–1.00)
GFR calc Af Amer: 18 mL/min — ABNORMAL LOW (ref >60)
GFR calc non Af Amer: 16 mL/min — ABNORMAL LOW (ref >60)
Glucose, Bld: 123 mg/dL — ABNORMAL HIGH (ref 70–99)
Potassium: 4.5 mmol/L (ref 3.5–5.1)
Sodium: 137 mmol/L (ref 135–145)

## 2020-04-02 LAB — URINE CULTURE: Culture: >=100000 — AB

## 2020-04-02 NOTE — Progress Notes (Signed)
[name] , MD [address], [place], [place], [place], [address] [address], [place], [place], [place], [address] Phone: [phone]  Fax: [phone]   Brittney [name] is being followed for abnormal ct scan of duodenum  Day 2 of follow up   Subjective: Doing well no complaints  Objective: Vital signs in last 24 hours: Vitals:   04/01/20 1242 04/01/20 1950 04/02/20 0504 04/02/20 0812  BP: (!) 139/53 (!) 142/55 122/70 (!) 146/52  Pulse: 60 70 64 63  Resp: 18 20 18 16   Temp: 97.7 F (36.5 C) 98.4 F (36.9 C) 98 F (36.7 C) 98.3 F (36.8 C)  TempSrc: Oral Oral Oral   SpO2: 97% 100% 96% 97%  Weight:      Height:       Weight change:   Intake/Output Summary (Last 24 hours) at 04/02/2020 0093 Last data filed at 04/02/2020 8182 Gross per 24 hour  Intake 1309.23 ml  Output 850 ml  Net 459.23 ml     Exam:  Alert and oriented x3 not in any pain or distress  Lab Results: @LABTEST2 @ Micro Results: Recent Results (from the past 240 hour(s))  Urine culture     Status: Abnormal   Collection Time: 03/31/20  2:57 PM   Specimen: Urine, Random  Result Value Ref Range Status   Specimen Description   Final    URINE, RANDOM Performed at [facility], [address], [place], [place] [address]    Special Requests   Final    NONE Performed at [facility], [address] [place], [place] [address]    Culture (A)  Final    >=100,000 COLONIES/mL MULTIPLE SPECIES PRESENT, SUGGEST RECOLLECTION   Report Status 04/02/2020 FINAL  Final  SARS CORONAVIRUS 2 (TAT 6-24 HRS) Nasopharyngeal Nasopharyngeal Swab     Status: None   Collection Time: 03/31/20  5:14 PM   Specimen: Nasopharyngeal Swab  Result Value Ref Range Status   SARS Coronavirus 2 NEGATIVE NEGATIVE Final    Comment: (NOTE) SARS-CoV-2 target nucleic acids are NOT DETECTED. The SARS-CoV-2 RNA is generally detectable in upper and lower respiratory specimens during the acute phase of  infection. Negative results do not preclude SARS-CoV-2 infection, do not rule out co-infections with other pathogens, and should not be used as the sole basis for treatment or other patient management decisions. Negative results must be combined with clinical observations, patient history, and epidemiological information. The expected result is Negative. Fact Sheet for Patients: SugarRoll.be Fact Sheet for Healthcare Providers: https://www.woods-mathews.com/ This test is not yet approved or cleared by the [place] FDA and  has been authorized for detection and/or diagnosis of SARS-CoV-2 by FDA under an Emergency Use Authorization (EUA). This EUA will remain  in effect (meaning this test can be used) for the duration of the COVID-19 declaration under Section 56 4(b)(1) of the Act, 21 U.S.C. section 360bbb-3(b)(1), unless the authorization is terminated or revoked sooner. Performed at [facility], [place] [address] [place], [place] [address]    Studies/Results: CT ABDOMEN PELVIS WO CONTRAST  Result Date: 03/31/2020 CLINICAL DATA:  Abdominal distension, lower abdominal pain EXAM: CT ABDOMEN AND PELVIS WITHOUT CONTRAST TECHNIQUE: Multidetector CT imaging of the abdomen and pelvis was performed following the standard protocol without IV contrast. COMPARISON:  06/24/2019 FINDINGS: Lower chest: No acute abnormality. Hepatobiliary: No focal liver abnormality is seen. Cholelithiasis. No biliary ductal dilatation. Pancreas: Unremarkable. No pancreatic ductal dilatation or surrounding inflammatory changes. Spleen: Normal in  size without focal abnormality. Adrenals/Urinary Tract: Adrenal glands are unremarkable. Kidneys are normal, without renal calculi, focal lesion, or hydronephrosis. Bladder is unremarkable. Stomach/Bowel: Severe bowel wall thickening of the first portion of the duodenum with surrounding inflammatory changes which may be related to a  duodenal ulcer or severe duodenitis secondary to an infectious or inflammatory etiology. No extraluminal gas to suggest perforation. No bowel dilatation to suggest obstruction. Vascular/Lymphatic: Normal caliber abdominal aorta with severe atherosclerosis. No lymphadenopathy. Reproductive: Uterus and bilateral adnexa are unremarkable. Other: No abdominal wall hernia or abnormality. No abdominopelvic ascites. Musculoskeletal: Degenerative disease with disc height loss at L5-S1 with bilateral facet arthropathy and bilateral foraminal stenosis. Grade 1 anterolisthesis of L4 on L5 secondary to facet disease. IMPRESSION: 1. Severe bowel wall thickening of the first portion of the duodenum with surrounding inflammatory changes which may be related to a duodenal ulcer or severe duodenitis secondary to an infectious or inflammatory etiology. No extraluminal gas to suggest perforation. 2.  Aortic Atherosclerosis (ICD10-I70.0). Electronically Signed   By: Kathreen Devoid   On: 03/31/2020 16:24   Medications: I have reviewed the patient's current medications. Scheduled Meds: . amLODipine  10 mg Oral Daily  . aspirin EC  81 mg Oral Daily  . atorvastatin  40 mg Oral Daily  . heparin  5,000 Units Subcutaneous Q8H  . insulin aspart  0-15 Units Subcutaneous Q4H  . loratadine  10 mg Oral Daily  . multivitamin with minerals  1 tablet Oral Daily  . pantoprazole (PROTONIX) IV  40 mg Intravenous Daily  . saccharomyces boulardii  250 mg Oral BID   Continuous Infusions: . dextrose 5 % and 0.45% NaCl 50 mL/hr at 04/02/20 0820   PRN Meds:.acetaminophen, albuterol, nitroGLYCERIN, ondansetron   Assessment: Active Problems:   Abdominal pain  Brittney [name] is a 79 y.o. y/o female with history of duodenal ulcer seen on endoscopy in June 2020.  She follows with Dr. Bonna Gains as an outpatient.  Plan was for repeat EGD in a couple of months but patient did not follow-up.  H. pylori IgG positive in the past but not been  treated.  Presents to the hospital with abdominal pain and CT scan shows findings of severe duodenitis/ulceration.Likely secondary to long term NSAID use (Alka seltzer often in a week)  Plan 1.  Omeprazole 40 mg p.o. twice daily 2.  Follow-up H. pylori stool antigen 3.  EGD tomorrow  4. No NSAID's - needs to stop- likely worsened AKI     LOS: 2 days   [name], MD 04/02/2020, 9:10 AM

## 2020-04-02 NOTE — Progress Notes (Signed)
[facility]  ROUNDING NOTE   Subjective:   Creatinine 2.78 (3.3) D5 1/2 NS at 32mL/hr  Objective:  Vital signs in last 24 hours:  Temp:  [98 F (36.7 C)-98.4 F (36.9 C)] 98 F (36.7 C) (04/07 1140) Pulse Rate:  [63-70] 65 (04/07 1140) Resp:  [16-20] 16 (04/07 1140) BP: (122-146)/(52-70) 125/59 (04/07 1140) SpO2:  [96 %-100 %] 98 % (04/07 1140)  Weight change:  Filed Weights   03/31/20 1401  Weight: 77.1 kg    Intake/Output: I/O last 3 completed shifts: In: 1415.2 [P.O.:540; I.V.:875.2] Out: 1150 [Urine:1150]   Intake/Output this shift:  Total I/O In: 478.2 [I.V.:478.2] Out: 700 [Urine:700]  Physical Exam: General: NAD,   Head: Normocephalic, atraumatic. Moist oral mucosal membranes  Eyes: Anicteric, PERRL  Neck: Supple, trachea midline  Lungs:  Clear to auscultation  Heart: Regular rate and rhythm  Abdomen:  LLQ tenderness.   Extremities:  no peripheral edema.  Neurologic: Nonfocal, moving all four extremities  Skin: No lesions        Basic Metabolic Panel: Recent Labs  Lab 03/31/20 1402 04/01/20 0446 04/02/20 0522  NA 135 139 137  K 4.8 4.1 4.5  CL 101 105 105  CO2 23 23 22   GLUCOSE 274* 102* 123*  BUN 62* 59* 45*  CREATININE 3.66* 3.30* 2.78*  CALCIUM 8.8* 8.6* 8.8*    Liver Function Tests: Recent Labs  Lab 03/31/20 1402  AST 13*  ALT 12  ALKPHOS 39  BILITOT 0.8  PROT 7.9  ALBUMIN 4.0   Recent Labs  Lab 03/31/20 1402  LIPASE 37   No results for input(s): AMMONIA in the last 168 hours.  CBC: Recent Labs  Lab 03/31/20 1402  WBC 10.7*  HGB 10.5*  HCT 33.0*  MCV 83.1  PLT 415*    Cardiac Enzymes: No results for input(s): CKTOTAL, CKMB, CKMBINDEX, TROPONINI in the last 168 hours.  BNP: Invalid input(s): POCBNP  CBG: Recent Labs  Lab 04/01/20 2357 04/02/20 0500 04/02/20 0750 04/02/20 1140 04/02/20 1618  GLUCAP 132* 118* 136* 176* 265*    Microbiology: Results for orders placed or performed  during the hospital encounter of 03/31/20  Urine culture     Status: Abnormal   Collection Time: 03/31/20  2:57 PM   Specimen: Urine, Random  Result Value Ref Range Status   Specimen Description   Final    URINE, RANDOM Performed at [facility], [address], [place], [place] [address]    Special Requests   Final    NONE Performed at [facility], [address] [place], [place] [address]    Culture (A)  Final    >=100,000 COLONIES/mL MULTIPLE SPECIES PRESENT, SUGGEST RECOLLECTION   Report Status 04/02/2020 FINAL  Final  SARS CORONAVIRUS 2 (TAT 6-24 HRS) Nasopharyngeal Nasopharyngeal Swab     Status: None   Collection Time: 03/31/20  5:14 PM   Specimen: Nasopharyngeal Swab  Result Value Ref Range Status   SARS Coronavirus 2 NEGATIVE NEGATIVE Final    Comment: (NOTE) SARS-CoV-2 target nucleic acids are NOT DETECTED. The SARS-CoV-2 RNA is generally detectable in upper and lower respiratory specimens during the acute phase of infection. Negative results do not preclude SARS-CoV-2 infection, do not rule out co-infections with other pathogens, and should not be used as the sole basis for treatment or other patient management decisions. Negative results must be combined with clinical observations, patient history, and epidemiological information. The expected result is Negative. Fact Sheet for Patients: SugarRoll.be  Fact Sheet for Healthcare Providers: https://www.woods-mathews.com/ This test is not yet approved or cleared by the [place] FDA and  has been authorized for detection and/or diagnosis of SARS-CoV-2 by FDA under an Emergency Use Authorization (EUA). This EUA will remain  in effect (meaning this test can be used) for the duration of the COVID-19 declaration under Section 56 4(b)(1) of the Act, 21 U.S.C. section 360bbb-3(b)(1), unless the authorization is terminated or revoked sooner. Performed at [facility], [place] [address], [place], [place] [address]     Coagulation Studies: No results for input(s): LABPROT, INR in the last 72 hours.  Urinalysis: Recent Labs    03/31/20 1457  COLORURINE YELLOW*  LABSPEC 1.015  PHURINE 5.0  GLUCOSEU NEGATIVE  HGBUR NEGATIVE  BILIRUBINUR NEGATIVE  KETONESUR NEGATIVE  PROTEINUR 100*  NITRITE NEGATIVE  LEUKOCYTESUR SMALL*      Imaging: DG Shoulder Right  Result Date: 04/02/2020 CLINICAL DATA:  Right shoulder pain. No known injury. EXAM: RIGHT SHOULDER - 2+ VIEW COMPARISON:  None. FINDINGS: [facility] joint and glenohumeral joints are fairly well maintained. No significant degenerative changes. No acute bony findings or abnormal soft tissue calcifications. The right lung is clear and the right ribs are intact per IMPRESSION: No acute bony findings or significant degenerative changes. Electronically Signed   By: Marijo Sanes M.D.   On: 04/02/2020 11:02     Medications:   . dextrose 5 % and 0.45% NaCl 50 mL/hr at 04/02/20 1530   . amLODipine  10 mg Oral Daily  . aspirin EC  81 mg Oral Daily  . atorvastatin  40 mg Oral Daily  . heparin  5,000 Units Subcutaneous Q8H  . insulin aspart  0-15 Units Subcutaneous Q4H  . loratadine  10 mg Oral Daily  . multivitamin with minerals  1 tablet Oral Daily  . pantoprazole (PROTONIX) IV  40 mg Intravenous Daily  . saccharomyces boulardii  250 mg Oral BID   acetaminophen, albuterol, nitroGLYCERIN, ondansetron  Assessment/ Plan:  [name] is a 79 y.o. black female with hypertension, systolic congestive heart failure, hyperlipidemia, diabetes mellitus type II insulin dependent, aortic valve stenosis, who is admitted to [facility] on 03/31/2020 for AKI (acute kidney injury) ([place]) [N17.9] Abdominal pain [R10.9] Duodenitis [K29.80]   1. 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  Acute renal  failure secondary to overdiuresis and poor po intake.  - holding home regimen of furosemide, spironolactone, and losartan.  - gentle IV fluids  2. Hypertension: well controlled. Holding agents as above.  - Continue amlodipine  3. Anemia with renal failure: with duodenitis. Endoscopy as per GI.    LOS: 2 Shulamit Donofrio 4/7/20216:01 PM

## 2020-04-02 NOTE — Progress Notes (Signed)
PROGRESS NOTE    Brittney [name]  HKV:[id number] DOB: 10/23/1941 DOA: 03/31/2020 PCP: Elisabeth Cara, NP     Assessment & Plan:   Active Problems:   Abdominal pain   Abd pain: secondary duodenal ulcer/severe duodenitis found on CT. Continue on IV protonix. Continue on IVFs. EGD likely tomorrow as per GI. GI following and recs apprec   AKI on CKDIIIb: possibly due to prerenal due overdiuresis. Cr is trending down today. Continue to hold spironolactone, lasix & losartan. Continue on IVFs. Nephro following and recs apprec  DM2: with renal manifestations. Most recent A1c 8.4, poorly controlled. Continue on SSI w/ accuchecks   CAD: stable. No chest pain. Continue to hold BB and imdur for now as her bp is low of normal. Continue on statin and aspirin  Essential hypertension: continue amlodipine. Hold metoprolol, hydralazine, lasix, spironolactone, cozaar  Chronic systolic heart failure: echo from March 2021 with normal EF now. Euvolemic   DVT prophylaxis: heparin SQ Code Status: full  Family Communication: discussed w/ pt's family who was at bedside  Disposition Plan:   Consultants:   GI    Procedures:    Antimicrobials:    Subjective: Pt c/o abd pain  Objective: Vitals:   04/01/20 1950 04/02/20 0504 04/02/20 0812 04/02/20 1140  BP: (!) 142/55 122/70 (!) 146/52 (!) 125/59  Pulse: 70 64 63 65  Resp: 20 18 16 16   Temp: 98.4 F (36.9 C) 98 F (36.7 C) 98.3 F (36.8 C) 98 F (36.7 C)  TempSrc: Oral Oral  Oral  SpO2: 100% 96% 97% 98%  Weight:      Height:        Intake/Output Summary (Last 24 hours) at 04/02/2020 1405 Last data filed at 04/02/2020 0509 Gross per 24 hour  Intake 1309.23 ml  Output 850 ml  Net 459.23 ml   Filed Weights   03/31/20 1401  Weight: 77.1 kg    Examination:  General exam: Appears calm and comfortable  Respiratory system: Clear to auscultation. Decreased breath sounds b/l Cardiovascular system: S1 & S2 + No rubs, gallops  or clicks.  Gastrointestinal system: Abdomen is nondistended, soft and mild tenderness to palpation. Normal bowel sounds heard. Central nervous system: Alert and oriented. Moves all 4 extremities  Psychiatry: Judgement and insight appear normal. Flat mood and affect     Data Reviewed: I have personally reviewed following labs and imaging studies  CBC: Recent Labs  Lab 03/31/20 1402  WBC 10.7*  HGB 10.5*  HCT 33.0*  MCV 83.1  PLT 564*   Basic Metabolic Panel: Recent Labs  Lab 03/31/20 1402 04/01/20 0446 04/02/20 0522  NA 135 139 137  K 4.8 4.1 4.5  CL 101 105 105  CO2 23 23 22   GLUCOSE 274* 102* 123*  BUN 62* 59* 45*  CREATININE 3.66* 3.30* 2.78*  CALCIUM 8.8* 8.6* 8.8*   GFR: Estimated Creatinine Clearance: 17.6 mL/min (A) (by C-G formula based on SCr of 2.78 mg/dL (H)). Liver Function Tests: Recent Labs  Lab 03/31/20 1402  AST 13*  ALT 12  ALKPHOS 39  BILITOT 0.8  PROT 7.9  ALBUMIN 4.0   Recent Labs  Lab 03/31/20 1402  LIPASE 37   No results for input(s): AMMONIA in the last 168 hours. Coagulation Profile: No results for input(s): INR, PROTIME in the last 168 hours. Cardiac Enzymes: No results for input(s): CKTOTAL, CKMB, CKMBINDEX, TROPONINI in the last 168 hours. BNP (last 3 results) No results for input(s): PROBNP in the last 8760  hours. HbA1C: No results for input(s): HGBA1C in the last 72 hours. CBG: Recent Labs  Lab 04/01/20 2025 04/01/20 2357 04/02/20 0500 04/02/20 0750 04/02/20 1140  GLUCAP 247* 132* 118* 136* 176*   Lipid Profile: No results for input(s): CHOL, HDL, LDLCALC, TRIG, CHOLHDL, LDLDIRECT in the last 72 hours. Thyroid Function Tests: No results for input(s): TSH, T4TOTAL, FREET4, T3FREE, THYROIDAB in the last 72 hours. Anemia Panel: No results for input(s): VITAMINB12, FOLATE, FERRITIN, TIBC, IRON, RETICCTPCT in the last 72 hours. Sepsis Labs: No results for input(s): PROCALCITON, LATICACIDVEN in the last 168  hours.  Recent Results (from the past 240 hour(s))  Urine culture     Status: Abnormal   Collection Time: 03/31/20  2:57 PM   Specimen: Urine, Random  Result Value Ref Range Status   Specimen Description   Final    URINE, RANDOM Performed at [facility], [address], [place], [place] [address]    Special Requests   Final    NONE Performed at [facility], [address] [place], [place] [address]    Culture (A)  Final    >=100,000 COLONIES/mL MULTIPLE SPECIES PRESENT, SUGGEST RECOLLECTION   Report Status 04/02/2020 FINAL  Final  SARS CORONAVIRUS 2 (TAT 6-24 HRS) Nasopharyngeal Nasopharyngeal Swab     Status: None   Collection Time: 03/31/20  5:14 PM   Specimen: Nasopharyngeal Swab  Result Value Ref Range Status   SARS Coronavirus 2 NEGATIVE NEGATIVE Final    Comment: (NOTE) SARS-CoV-2 target nucleic acids are NOT DETECTED. The SARS-CoV-2 RNA is generally detectable in upper and lower respiratory specimens during the acute phase of infection. Negative results do not preclude SARS-CoV-2 infection, do not rule out co-infections with other pathogens, and should not be used as the sole basis for treatment or other patient management decisions. Negative results must be combined with clinical observations, patient history, and epidemiological information. The expected result is Negative. Fact Sheet for Patients: SugarRoll.be Fact Sheet for Healthcare Providers: https://www.woods-mathews.com/ This test is not yet approved or cleared by the [place] FDA and  has been authorized for detection and/or diagnosis of SARS-CoV-2 by FDA under an Emergency Use Authorization (EUA). This EUA will remain  in effect (meaning this test can be used) for the duration of the COVID-19 declaration under Section 56 4(b)(1) of the Act, 21 U.S.C. section 360bbb-3(b)(1), unless the authorization is terminated or revoked  sooner. Performed at [facility], [place] [address], [place], [place] [address]          Radiology Studies: CT ABDOMEN PELVIS WO CONTRAST  Result Date: 03/31/2020 CLINICAL DATA:  Abdominal distension, lower abdominal pain EXAM: CT ABDOMEN AND PELVIS WITHOUT CONTRAST TECHNIQUE: Multidetector CT imaging of the abdomen and pelvis was performed following the standard protocol without IV contrast. COMPARISON:  06/24/2019 FINDINGS: Lower chest: No acute abnormality. Hepatobiliary: No focal liver abnormality is seen. Cholelithiasis. No biliary ductal dilatation. Pancreas: Unremarkable. No pancreatic ductal dilatation or surrounding inflammatory changes. Spleen: Normal in size without focal abnormality. Adrenals/Urinary Tract: Adrenal glands are unremarkable. Kidneys are normal, without renal calculi, focal lesion, or hydronephrosis. Bladder is unremarkable. Stomach/Bowel: Severe bowel wall thickening of the first portion of the duodenum with surrounding inflammatory changes which may be related to a duodenal ulcer or severe duodenitis secondary to an infectious or inflammatory etiology. No extraluminal gas to suggest perforation. No bowel dilatation to suggest obstruction. Vascular/Lymphatic: Normal caliber abdominal aorta with severe atherosclerosis. No lymphadenopathy. Reproductive: Uterus and bilateral adnexa are  unremarkable. Other: No abdominal wall hernia or abnormality. No abdominopelvic ascites. Musculoskeletal: Degenerative disease with disc height loss at L5-S1 with bilateral facet arthropathy and bilateral foraminal stenosis. Grade 1 anterolisthesis of L4 on L5 secondary to facet disease. IMPRESSION: 1. Severe bowel wall thickening of the first portion of the duodenum with surrounding inflammatory changes which may be related to a duodenal ulcer or severe duodenitis secondary to an infectious or inflammatory etiology. No extraluminal gas to suggest perforation. 2.  Aortic Atherosclerosis  (ICD10-I70.0). Electronically Signed   By: Kathreen Devoid   On: 03/31/2020 16:24   DG Shoulder Right  Result Date: 04/02/2020 CLINICAL DATA:  Right shoulder pain. No known injury. EXAM: RIGHT SHOULDER - 2+ VIEW COMPARISON:  None. FINDINGS: [facility] joint and glenohumeral joints are fairly well maintained. No significant degenerative changes. No acute bony findings or abnormal soft tissue calcifications. The right lung is clear and the right ribs are intact per IMPRESSION: No acute bony findings or significant degenerative changes. Electronically Signed   By: Marijo Sanes M.D.   On: 04/02/2020 11:02        Scheduled Meds: . amLODipine  10 mg Oral Daily  . aspirin EC  81 mg Oral Daily  . atorvastatin  40 mg Oral Daily  . heparin  5,000 Units Subcutaneous Q8H  . insulin aspart  0-15 Units Subcutaneous Q4H  . loratadine  10 mg Oral Daily  . multivitamin with minerals  1 tablet Oral Daily  . pantoprazole (PROTONIX) IV  40 mg Intravenous Daily  . saccharomyces boulardii  250 mg Oral BID   Continuous Infusions: . dextrose 5 % and 0.45% NaCl 50 mL/hr at 04/02/20 0820     LOS: 2 days    Time spent: 35 mins    Wyvonnia Dusky, MD [facility] Pager [phone]  If 7PM-7AM, please contact night-coverage [email or web address] 04/02/2020, 2:05 PM

## 2020-04-02 NOTE — TOC Initial Note (Signed)
Transition of Care [facility]) - Initial/Assessment Note    Patient Details  Name: Brittney [name] MRN: [id number] Date of Birth: 12/16/1941  Transition of Care [facility]) CM/SW Contact:    Magnus Ivan, LCSW Phone Number: 04/02/2020, 11:16 AM  Clinical Narrative:            CSW met with patient and daughter at bedside for Readmission Screen. Explained CSW role. Patient lives with her daughter and great grandson. Her daughters provide transportation to appointments. Patient uses [facility] representative in [place] and denied issues with obtaining medications. PCP is [facility]. Patient denied having DME. Patient used [facility] recently, but denied current Home Health involvement. Denied SNF history. CSW encouraged patient and family to reach out with any needs. CSW will continue to follow.        Expected Discharge Plan: Home/Self Care Barriers to Discharge: Continued Medical Work up   Patient Goals and CMS Choice        Expected Discharge Plan and Services Expected Discharge Plan: Home/Self Care       Living arrangements for the past 2 months: Single Family Home                                      Prior Living Arrangements/Services Living arrangements for the past 2 months: Single Family Home Lives with:: Adult Children(daughter and great grandson) Patient language and need for interpreter reviewed:: Yes Do you feel safe going back to the place where you live?: Yes      Need for Family Participation in Patient Care: Yes (Comment) Care giver support system in place?: Yes (comment)   Criminal Activity/Legal Involvement Pertinent to Current Situation/Hospitalization: No - Comment as needed  Activities of Daily Living Home Assistive Devices/Equipment: Cane (specify quad or straight), Bedside commode/3-in-1, Shower chair with back, CBG Meter, Dentures (specify type), Eyeglasses, Blood pressure cuff ADL Screening (condition at time of admission) Patient's  cognitive ability adequate to safely complete daily activities?: Yes Is the patient deaf or have difficulty hearing?: No Does the patient have difficulty seeing, even when wearing glasses/contacts?: No Does the patient have difficulty concentrating, remembering, or making decisions?: No Patient able to express need for assistance with ADLs?: Yes Does the patient have difficulty dressing or bathing?: No Independently performs ADLs?: Yes (appropriate for developmental age) Does the patient have difficulty walking or climbing stairs?: No Weakness of Legs: Both(uses a cane at home) Weakness of Arms/Hands: None  Permission Sought/Granted                  Emotional Assessment Appearance:: Appears stated age Attitude/Demeanor/Rapport: Engaged, Gracious Affect (typically observed): Calm, Appropriate Orientation: : Oriented to Self, Oriented to Place, Oriented to  Time, Oriented to Situation Alcohol / Substance Use: Not Applicable Psych Involvement: No (comment)  Admission diagnosis:  AKI (acute kidney injury) ([place]) [N17.9] Abdominal pain [R10.9] Duodenitis [K29.80] Patient Active Problem List   Diagnosis Date Noted  . Abdominal pain 03/31/2020  . Chest pain 03/19/2020  . Acute renal failure superimposed on stage 3a chronic kidney disease ([place]) 03/19/2020  . CAD (coronary artery disease) 03/19/2020  . Chronic systolic CHF (congestive heart failure) ([place]) 03/19/2020  . Leukocytosis 03/19/2020  . Acute exacerbation of CHF (congestive heart failure) ([place]) 02/01/2020  . Elevated troponin 02/01/2020  . Reflux esophagitis   . CLE (columnar lined esophagus)   . Duodenitis 06/25/2019  . Chronic heart  failure with preserved ejection fraction (HFpEF) ([place]) 02/26/2019  . Benign essential HTN 01/17/2019  . DM (diabetes mellitus) ([place]) 01/17/2019  . Type II diabetes mellitus with renal manifestations ([place]) 07/24/2015  . GERD without esophagitis 07/24/2015  . Hyperlipidemia 07/24/2015    PCP:  Elisabeth Cara, NP Pharmacy:   [facility] [phone] - [address], [address] [place] [address] Phone: [phone] Fax: [phone]     Social Determinants of Health (SDOH) Interventions    Readmission Risk Interventions Readmission Risk Prevention Plan 04/02/2020 08/10/2018  Transportation Screening Complete Complete  PCP or Specialist Appt within 5-7 Days - Complete  PCP or Specialist Appt within 3-5 Days Complete -  Home Care Screening - Complete  Medication Review (RN CM) - Complete  HRI or Home Care Consult Complete -  Medication Review (RN Care Manager) Complete -  Some recent data might be hidden

## 2020-04-03 ENCOUNTER — Encounter
Admission: EM | Disposition: A | Discharge status: Home / Self Care | Payer: Self-pay | Source: Home / Self Care | Attending: Internal Medicine

## 2020-04-03 ENCOUNTER — Inpatient Hospital Stay: Payer: Medicare HMO | Admitting: Registered Nurse

## 2020-04-03 ENCOUNTER — Encounter: Payer: Self-pay | Admitting: *Deleted

## 2020-04-03 DIAGNOSIS — K2980 Duodenitis without bleeding: Principal | ICD-10-CM

## 2020-04-03 HISTORY — PX: ESOPHAGOGASTRODUODENOSCOPY (EGD) WITH PROPOFOL: SHX5813

## 2020-04-03 LAB — H. PYLORI ANTIGEN, STOOL: H. Pylori Stool Ag, Eia: NEGATIVE

## 2020-04-03 LAB — BASIC METABOLIC PANEL
Anion gap: 8 (ref 5–15)
BUN: 31 mg/dL — ABNORMAL HIGH (ref 8–23)
CO2: 25 mmol/L (ref 22–32)
Calcium: 9.2 mg/dL (ref 8.9–10.3)
Chloride: 105 mmol/L (ref 98–111)
Creatinine, Ser: 2.2 mg/dL — ABNORMAL HIGH (ref 0.44–1.00)
GFR calc Af Amer: 24 mL/min — ABNORMAL LOW (ref >60)
GFR calc non Af Amer: 21 mL/min — ABNORMAL LOW (ref >60)
Glucose, Bld: 182 mg/dL — ABNORMAL HIGH (ref 70–99)
Potassium: 4.4 mmol/L (ref 3.5–5.1)
Sodium: 138 mmol/L (ref 135–145)

## 2020-04-03 LAB — CBC
HCT: 27.6 % — ABNORMAL LOW (ref 36.0–46.0)
Hemoglobin: 9.1 g/dL — ABNORMAL LOW (ref 12.0–15.0)
MCH: 26.6 pg (ref 26.0–34.0)
MCHC: 33 g/dL (ref 30.0–36.0)
MCV: 80.7 fL (ref 80.0–100.0)
Platelets: 383 K/uL (ref 150–400)
RBC: 3.42 MIL/uL — ABNORMAL LOW (ref 3.87–5.11)
RDW: 14.1 % (ref 11.5–15.5)
WBC: 6.9 K/uL (ref 4.0–10.5)
nRBC: 0 % (ref 0.0–0.2)

## 2020-04-03 LAB — GLUCOSE, CAPILLARY
Glucose-Capillary: 206 mg/dL — ABNORMAL HIGH (ref 70–99)
Glucose-Capillary: 168 mg/dL — ABNORMAL HIGH (ref 70–99)
Glucose-Capillary: 162 mg/dL — ABNORMAL HIGH (ref 70–99)
Glucose-Capillary: 188 mg/dL — ABNORMAL HIGH (ref 70–99)
Glucose-Capillary: 256 mg/dL — ABNORMAL HIGH (ref 70–99)
Glucose-Capillary: 199 mg/dL — ABNORMAL HIGH (ref 70–99)

## 2020-04-03 SURGERY — ESOPHAGOGASTRODUODENOSCOPY (EGD) WITH PROPOFOL
Anesthesia: General

## 2020-04-03 MED ORDER — SODIUM CHLORIDE 0.9 % IV SOLN
INTRAVENOUS | Status: DC
  Administered 2020-04-03: 1000 mL via INTRAVENOUS

## 2020-04-03 MED ORDER — PROPOFOL 10 MG/ML IV BOLUS
INTRAVENOUS | Status: DC | PRN
  Administered 2020-04-03: 70 mg via INTRAVENOUS

## 2020-04-03 MED ORDER — METOPROLOL TARTRATE 50 MG PO TABS
50.0000 mg | ORAL_TABLET | Freq: Two times a day (BID) | ORAL | Status: DC
  Administered 2020-04-03 – 2020-04-04 (×3): 50 mg via ORAL
  Filled 2020-04-03 (×3): qty 1

## 2020-04-03 MED ORDER — LIDOCAINE HCL (CARDIAC) PF 100 MG/5ML IV SOSY
PREFILLED_SYRINGE | INTRAVENOUS | Status: DC | PRN
  Administered 2020-04-03: 100 mg via INTRAVENOUS

## 2020-04-03 NOTE — H&P (Signed)
[name], MD [address], [place], [place], [place], [address] [address], [place], [place], [place], [address] Phone: [phone]  Fax: [phone]  Primary Care Physician:  Elisabeth Cara, NP   Pre-Procedure History & Physical: HPI:  Brittney [name] is a 79 y.o. female is here for an endoscopy    Past Medical History:  Diagnosis Date  . Aortic valve stenosis   . Carotid bruit   . CHF (congestive heart failure) ([place])   . Chronic kidney disease   . Diabetes mellitus without complication ([place])   . GERD (gastroesophageal reflux disease)   . Heart murmur   . Hyperlipidemia   . Hypertension   . Pneumonia   . Vitamin D deficiency     Past Surgical History:  Procedure Laterality Date  . COLONOSCOPY    . COLONOSCOPY WITH PROPOFOL N/A 08/02/2019   Procedure: COLONOSCOPY WITH PROPOFOL;  Surgeon: Virgel Manifold, MD;  Location: [facility] ENDOSCOPY;  Service: Endoscopy;  Laterality: N/A;  . CORONARY ANGIOPLASTY    . ESOPHAGOGASTRODUODENOSCOPY N/A 06/26/2019   Procedure: ESOPHAGOGASTRODUODENOSCOPY (EGD);  Surgeon: Virgel Manifold, MD;  Location: [facility] ENDOSCOPY;  Service: Endoscopy;  Laterality: N/A;  . ESOPHAGOGASTRODUODENOSCOPY (EGD) WITH PROPOFOL N/A 08/02/2019   Procedure: ESOPHAGOGASTRODUODENOSCOPY (EGD) WITH PROPOFOL;  Surgeon: Virgel Manifold, MD;  Location: [facility] ENDOSCOPY;  Service: Endoscopy;  Laterality: N/A;  . LEFT HEART CATH AND CORONARY ANGIOGRAPHY N/A 01/23/2019   Procedure: LEFT HEART CATH AND CORONARY ANGIOGRAPHY;  Surgeon: Yolonda Kida, MD;  Location: [facility];  Service: Cardiovascular;  Laterality: N/A;  . ovarian cyst removal      Prior to Admission medications   Medication Sig Start Date End Date Taking? Authorizing Provider  acetaminophen (TYLENOL) 325 MG tablet Take 2 tablets (650 mg total) by mouth every 6 (six) hours as needed for mild pain (or Fever >/= 101). 06/26/19  Yes Gouru, Aruna, MD  albuterol (VENTOLIN HFA) 108  (90 Base) MCG/ACT inhaler Inhale 2 puffs into the lungs every 6 (six) hours as needed for wheezing.   Yes [provider]  amLODipine (NORVASC) 10 MG tablet Take 10 mg by mouth daily.   Yes [provider]  aspirin EC 81 MG EC tablet Take 1 tablet (81 mg total) by mouth daily. 06/27/19  Yes Gouru, Illene Silver, MD  aspirin-sod bicarb-citric acid (ALKA-SELTZER) 325 MG TBEF tablet Take 325 mg by mouth every 6 (six) hours as needed.   Yes [provider]  atorvastatin (LIPITOR) 40 MG tablet Take 40 mg by mouth daily.  07/04/18  Yes [provider]  bismuth subsalicylate (PEPTO BISMOL) 262 MG/15ML suspension Take 30 mLs by mouth every 6 (six) hours as needed.   Yes [provider]  cetirizine (ZYRTEC) 10 MG tablet Take 10 mg by mouth daily.    Yes [provider]  Choline Fenofibrate 45 MG capsule Take 45 mg by mouth daily.    Yes [provider]  furosemide (LASIX) 40 MG tablet Take 40 mg by mouth 2 (two) times daily.   Yes [provider]  glipiZIDE (GLUCOTROL XL) 10 MG 24 hr tablet Take 10 mg by mouth 2 (two) times daily.   Yes [provider]  hydrALAZINE (APRESOLINE) 50 MG tablet Take 50 mg by mouth 3 (three) times daily.   Yes [provider]  isosorbide mononitrate (IMDUR) 30 MG 24 hr tablet Take 30 mg by mouth 3 (three) times daily.    Yes [provider]  LANTUS SOLOSTAR 100 UNIT/ML Solostar Pen Inject 16 Units into the skin at bedtime. 10/19/19  Yes [provider]  metFORMIN (GLUCOPHAGE) 1000 MG tablet Take 1,000 mg by mouth 2 (two) times daily with a meal.    Yes [provider]  metoprolol tartrate (LOPRESSOR) 50 MG tablet Take 50 mg by mouth 2 (two) times daily.   Yes [provider]  Multiple Vitamin (MULTIVITAMIN WITH MINERALS) TABS tablet Take 1 tablet by mouth daily.   Yes [provider]  nitroGLYCERIN (NITROSTAT) 0.4 MG SL tablet Place 1 tablet (0.4 mg total)  under the tongue every 5 (five) minutes as needed for chest pain. 03/20/20  Yes Nicole Kindred A, DO  omeprazole (PRILOSEC) 20 MG capsule Take 20 mg by mouth daily.   Yes [provider]  pantoprazole (PROTONIX) 40 MG tablet Take 1 tablet (40 mg total) by mouth 2 (two) times daily. 06/26/19  Yes Gouru, Illene Silver, MD  saccharomyces boulardii (FLORASTOR) 250 MG capsule Take 250 mg by mouth 2 (two) times daily.   Yes [provider]  sitaGLIPtin (JANUVIA) 100 MG tablet Take 100 mg by mouth daily.   Yes [provider]  spironolactone (ALDACTONE) 25 MG tablet Take 12.5 mg by mouth 2 (two) times daily.  02/07/20  Yes [provider]    Allergies as of 03/31/2020 - Review Complete 03/31/2020  Allergen Reaction Noted  . Accupril [quinapril hcl] Other (See Comments) 08/01/2015    Family History  Problem Relation Age of Onset  . Diabetes Neg Hx   . Hypertension Neg Hx   . Breast cancer Neg Hx     Social History   Socioeconomic History  . Marital status: Widowed    Spouse name: Not on file  . Number of children: Not on file  . Years of education: Not on file  . Highest education level: Not on file  Occupational History  . Not on file  Tobacco Use  . Smoking status: Former Smoker    Packs/day: 1.00    Years: 40.00    Pack years: 40.00    Types: Cigarettes    Quit date: 07/27/2018    Years since quitting: 1.6  . Smokeless tobacco: Never Used  Substance and Sexual Activity  . Alcohol use: No  . Drug use: No  . Sexual activity: Not on file  Other Topics Concern  . Not on file  Social History Narrative  . Not on file   Social Determinants of Health   Financial Resource Strain:   . Difficulty of Paying Living Expenses:   Food Insecurity:   . Worried About Charity fundraiser in the Last Year:   . Arboriculturist in the Last Year:   Transportation Needs:   . Film/video editor (Medical):   Marland Kitchen Lack of Transportation (Non-Medical):   Physical  Activity:   . Days of Exercise per Week:   . Minutes of Exercise per Session:   Stress:   . Feeling of Stress :   Social Connections:   . Frequency of Communication with Friends and Family:   . Frequency of Social Gatherings with Friends and Family:   . Attends Religious Services:   . Active Member of Clubs or Organizations:   . Attends Archivist Meetings:   Marland Kitchen Marital Status:   Intimate Partner Violence:   . Fear of Current or Ex-Partner:   . Emotionally Abused:   Marland Kitchen Physically Abused:   . Sexually Abused:  Review of Systems: See HPI, otherwise negative ROS  Physical Exam: BP (!) 164/55   Pulse 71   Temp 98.6 F (37 C) (Tympanic)   Resp 20   Ht 5\' 7"  (1.702 m)   Wt 77.1 kg   SpO2 99%   BMI 26.63 kg/m  General:   Alert,  pleasant and cooperative in NAD Head:  Normocephalic and atraumatic. Neck:  Supple; no masses or thyromegaly. Lungs:  Clear throughout to auscultation, normal respiratory effort.    Heart:  +S1, +S2, Regular rate and rhythm, No edema. Abdomen:  Soft, nontender and nondistended. Normal bowel sounds, without guarding, and without rebound.   Neurologic:  Alert and  oriented x4;  grossly normal neurologically.  Impression/Plan: MAKEBA DELCASTILLO is here for an endoscopy  to be performed for  evaluation of abnormal ct scan of abdomen    Risks, benefits, limitations, and alternatives regarding endoscopy have been reviewed with the patient.  Questions have been answered.  All parties agreeable.   [name], MD  04/03/2020, 10:39 AM

## 2020-04-03 NOTE — Care Management Important Message (Signed)
Important Message  Patient Details  Name: Brittney [name] MRN: [id number] Date of Birth: Jun 15, 1941   Medicare Important Message Given:  Yes     Dannette Barbara 04/03/2020, 1:38 PM

## 2020-04-03 NOTE — Transfer of Care (Signed)
Immediate Anesthesia Transfer of Care Note  Patient: EKNOOR NOVACK  Procedure(s) Performed: ESOPHAGOGASTRODUODENOSCOPY (EGD) WITH PROPOFOL (N/A )  Patient Location: PACU  Anesthesia Type:General  Level of Consciousness: sedated  Airway & Oxygen Therapy: Patient Spontanous Breathing  Post-op Assessment: Report given to RN and Post -op Vital signs reviewed and stable  Post vital signs: Reviewed and stable  Last Vitals:  Vitals Value Taken Time  BP 157/60 04/03/20 1056  Temp 36.4 C 04/03/20 1054  Pulse 72 04/03/20 1057  Resp 27 04/03/20 1057  SpO2 99 % 04/03/20 1057  Vitals shown include unvalidated device data.  Last Pain:  Vitals:   04/03/20 1054  TempSrc: Temporal  PainSc:          Complications: No apparent anesthesia complications

## 2020-04-03 NOTE — Anesthesia Preprocedure Evaluation (Addendum)
Anesthesia Evaluation  Patient identified by MRN, date of birth, ID band Patient awake    Reviewed: Allergy & Precautions, H&P , NPO status , Patient's Chart, lab work & pertinent test results  Airway Mallampati: II  TM Distance: >3 FB     Dental  (+) Edentulous Lower, Edentulous Upper   Pulmonary former smoker,    breath sounds clear to auscultation       Cardiovascular hypertension, + CAD and +CHF (HFpEF, grade 1 diastolic dysfunction)   Rhythm:regular Rate:Normal + Systolic murmurs Mildly elevated PA pressure   Neuro/Psych negative neurological ROS  negative psych ROS   GI/Hepatic Neg liver ROS, GERD  ,  Endo/Other  diabetes  Renal/GU Renal disease ([facility])     Musculoskeletal   Abdominal   Peds  Hematology  (+) Blood dyscrasia, anemia ,   Anesthesia Other Findings Past Medical History: No date: Aortic valve stenosis No date: Carotid bruit No date: CHF (congestive heart failure) (HCC) No date: Chronic kidney disease No date: Diabetes mellitus without complication (H[facility]) No date: GERD (gastroesophageal reflux disease) No date: Heart murmur No date: Hyperlipidemia No date: Hypertension No date: Pneumonia No date: Vitamin D deficiency  Past Surgical History: No date: COLONOSCOPY 08/02/2019: COLONOSCOPY WITH PROPOFOL; N/A     Comment:  Procedure: COLONOSCOPY WITH PROPOFOL;  Surgeon:               Virgel Manifold, MD;  Location: [facility] ENDOSCOPY;                Service: Endoscopy;  Laterality: N/A; No date: CORONARY ANGIOPLASTY 06/26/2019: ESOPHAGOGASTRODUODENOSCOPY; N/A     Comment:  Procedure: ESOPHAGOGASTRODUODENOSCOPY (EGD);  Surgeon:               Virgel Manifold, MD;  Location: [facility] ENDOSCOPY;                Service: Endoscopy;  Laterality: N/A; 08/02/2019: ESOPHAGOGASTRODUODENOSCOPY (EGD) WITH PROPOFOL; N/A     Comment:  Procedure: ESOPHAGOGASTRODUODENOSCOPY (EGD) WITH               PROPOFOL;   Surgeon: Virgel Manifold, MD;  Location:               [facility] ENDOSCOPY;  Service: Endoscopy;  Laterality: N/A; 01/23/2019: LEFT HEART CATH AND CORONARY ANGIOGRAPHY; N/A     Comment:  Procedure: LEFT HEART CATH AND CORONARY ANGIOGRAPHY;                Surgeon: Yolonda Kida, MD;  Location: [facility];  Service: Cardiovascular;  Laterality: N/A; No date: ovarian cyst removal  BMI    Body Mass Index: 26.63 kg/m      Reproductive/Obstetrics negative OB ROS                            Anesthesia Physical Anesthesia Plan  ASA: III  Anesthesia Plan: General   Post-op Pain Management:    Induction:   PONV Risk Score and Plan: Treatment may vary due to age or medical condition, Propofol infusion and TIVA  Airway Management Planned: Natural Airway and Nasal Cannula  Additional Equipment:   Intra-op Plan:   Post-operative Plan:   Informed Consent: I have reviewed the patients History and Physical, chart, labs and discussed the procedure including the risks, benefits and alternatives for the proposed anesthesia with the patient or authorized  representative who has indicated his/her understanding and acceptance.     Dental Advisory Given  Plan Discussed with: Anesthesiologist  Anesthesia Plan Comments:         Anesthesia Quick Evaluation

## 2020-04-03 NOTE — Anesthesia Postprocedure Evaluation (Signed)
Anesthesia Post Note  Patient: Brittney [name]  Procedure(s) Performed: ESOPHAGOGASTRODUODENOSCOPY (EGD) WITH PROPOFOL (N/A )  Patient location during evaluation: PACU Anesthesia Type: General Level of consciousness: awake and alert Pain management: pain level controlled Vital Signs Assessment: post-procedure vital signs reviewed and stable Respiratory status: spontaneous breathing, nonlabored ventilation and respiratory function stable Cardiovascular status: blood pressure returned to baseline and stable Postop Assessment: no apparent nausea or vomiting Anesthetic complications: no     Last Vitals:  Vitals:   04/03/20 1114 04/03/20 1121  BP: (!) 183/71 (!) 175/71  Pulse: 90 89  Resp: (!) 26 (!) 25  Temp:    SpO2: 100% 98%    Last Pain:  Vitals:   04/03/20 1121  TempSrc:   PainSc: 0-No pain                 Tera Mater

## 2020-04-03 NOTE — Progress Notes (Signed)
PROGRESS NOTE    Brittney [name]  RAQ:[id number] DOB: 06/23/1941 DOA: 03/31/2020 PCP: Elisabeth Cara, NP     Assessment & Plan:   Active Problems:   Abdominal pain   Abd pain: secondary duodenal ulcer/severe duodenitis found on CT. Continue on IV protonix. Continue on IVFs. EGD today as per GI. GI following and recs apprec   AKI on CKDIIIb: possibly due to prerenal due overdiuresis. Cr continues to trend down daily. Continue to hold spironolactone, lasix & losartan. Continue on IVFs. Nephro following and recs apprec  DM2: with renal manifestations. Most recent A1c 8.4, poorly controlled. Continue on SSI w/ accuchecks   CAD: stable. No chest pain. Continue on metoprolol & continue to hold imdur. Continue on statin and aspirin  Essential hypertension: continue amlodipine, metoprolol. Hold hydralazine, lasix, spironolactone, cozaar  Chronic systolic heart failure: echo from March 2021 with normal EF now. Euvolemic   DVT prophylaxis: heparin SQ Code Status: full  Family Communication: discussed w/ pt's family who was at bedside  Disposition Plan: likely will d/c back home    Consultants:   GI    Procedures:    Antimicrobials:    Subjective: Pt c/o fatigue  Objective: Vitals:   04/02/20 0812 04/02/20 1140 04/02/20 1942 04/03/20 0404  BP: (!) 146/52 (!) 125/59 (!) 151/58 (!) 145/60  Pulse: 63 65 74 84  Resp: 16 16 20 16   Temp: 98.3 F (36.8 C) 98 F (36.7 C) 98.7 F (37.1 C) 98.6 F (37 C)  TempSrc:  Oral Oral Oral  SpO2: 97% 98% 98% 97%  Weight:      Height:        Intake/Output Summary (Last 24 hours) at 04/03/2020 0752 Last data filed at 04/03/2020 0649 Gross per 24 hour  Intake 478.18 ml  Output 1600 ml  Net -1121.82 ml   Filed Weights   03/31/20 1401  Weight: 77.1 kg    Examination:  General exam: Appears calm and comfortable  Respiratory system:  Diminished breath sounds b/l. No rales Cardiovascular system: S1 & S2 + No rubs, gallops  or clicks.  Gastrointestinal system: Abdomen is nondistended, soft and mild tenderness to palpation. hypoactive bowel sounds heard. Central nervous system: Alert and oriented. Moves all 4 extremities  Psychiatry: Judgement and insight appear normal. Flat mood and affect     Data Reviewed: I have personally reviewed following labs and imaging studies  CBC: Recent Labs  Lab 03/31/20 1402 04/03/20 0553  WBC 10.7* 6.9  HGB 10.5* 9.1*  HCT 33.0* 27.6*  MCV 83.1 80.7  PLT 415* 456   Basic Metabolic Panel: Recent Labs  Lab 03/31/20 1402 04/01/20 0446 04/02/20 0522 04/03/20 0553  NA 135 139 137 138  K 4.8 4.1 4.5 4.4  CL 101 105 105 105  CO2 23 23 22 25   GLUCOSE 274* 102* 123* 182*  BUN 62* 59* 45* 31*  CREATININE 3.66* 3.30* 2.78* 2.20*  CALCIUM 8.8* 8.6* 8.8* 9.2   GFR: Estimated Creatinine Clearance: 22.2 mL/min (A) (by C-G formula based on SCr of 2.2 mg/dL (H)). Liver Function Tests: Recent Labs  Lab 03/31/20 1402  AST 13*  ALT 12  ALKPHOS 39  BILITOT 0.8  PROT 7.9  ALBUMIN 4.0   Recent Labs  Lab 03/31/20 1402  LIPASE 37   No results for input(s): AMMONIA in the last 168 hours. Coagulation Profile: No results for input(s): INR, PROTIME in the last 168 hours. Cardiac Enzymes: No results for input(s): CKTOTAL, CKMB, CKMBINDEX, TROPONINI in the  last 168 hours. BNP (last 3 results) No results for input(s): PROBNP in the last 8760 hours. HbA1C: No results for input(s): HGBA1C in the last 72 hours. CBG: Recent Labs  Lab 04/02/20 1618 04/02/20 1944 04/02/20 2332 04/03/20 0359 04/03/20 0728  GLUCAP 265* 237* 214* 206* 168*   Lipid Profile: No results for input(s): CHOL, HDL, LDLCALC, TRIG, CHOLHDL, LDLDIRECT in the last 72 hours. Thyroid Function Tests: No results for input(s): TSH, T4TOTAL, FREET4, T3FREE, THYROIDAB in the last 72 hours. Anemia Panel: No results for input(s): VITAMINB12, FOLATE, FERRITIN, TIBC, IRON, RETICCTPCT in the last 72  hours. Sepsis Labs: No results for input(s): PROCALCITON, LATICACIDVEN in the last 168 hours.  Recent Results (from the past 240 hour(s))  Urine culture     Status: Abnormal   Collection Time: 03/31/20  2:57 PM   Specimen: Urine, Random  Result Value Ref Range Status   Specimen Description   Final    URINE, RANDOM Performed at [facility], [address], [place], [place] [address]    Special Requests   Final    NONE Performed at [facility], [address] [place], [place] [address]    Culture (A)  Final    >=100,000 COLONIES/mL MULTIPLE SPECIES PRESENT, SUGGEST RECOLLECTION   Report Status 04/02/2020 FINAL  Final  SARS CORONAVIRUS 2 (TAT 6-24 HRS) Nasopharyngeal Nasopharyngeal Swab     Status: None   Collection Time: 03/31/20  5:14 PM   Specimen: Nasopharyngeal Swab  Result Value Ref Range Status   SARS Coronavirus 2 NEGATIVE NEGATIVE Final    Comment: (NOTE) SARS-CoV-2 target nucleic acids are NOT DETECTED. The SARS-CoV-2 RNA is generally detectable in upper and lower respiratory specimens during the acute phase of infection. Negative results do not preclude SARS-CoV-2 infection, do not rule out co-infections with other pathogens, and should not be used as the sole basis for treatment or other patient management decisions. Negative results must be combined with clinical observations, patient history, and epidemiological information. The expected result is Negative. Fact Sheet for Patients: SugarRoll.be Fact Sheet for Healthcare Providers: https://www.woods-mathews.com/ This test is not yet approved or cleared by the [place] FDA and  has been authorized for detection and/or diagnosis of SARS-CoV-2 by FDA under an Emergency Use Authorization (EUA). This EUA will remain  in effect (meaning this test can be used) for the duration of the COVID-19 declaration under Section 56 4(b)(1) of the Act, 21  U.S.C. section 360bbb-3(b)(1), unless the authorization is terminated or revoked sooner. Performed at [facility], [place] [address] [place], [place] [address]          Radiology Studies: DG Shoulder Right  Result Date: 04/02/2020 CLINICAL DATA:  Right shoulder pain. No known injury. EXAM: RIGHT SHOULDER - 2+ VIEW COMPARISON:  None. FINDINGS: [facility] joint and glenohumeral joints are fairly well maintained. No significant degenerative changes. No acute bony findings or abnormal soft tissue calcifications. The right lung is clear and the right ribs are intact per IMPRESSION: No acute bony findings or significant degenerative changes. Electronically Signed   By: Marijo Sanes M.D.   On: 04/02/2020 11:02        Scheduled Meds: . amLODipine  10 mg Oral Daily  . aspirin EC  81 mg Oral Daily  . atorvastatin  40 mg Oral Daily  . heparin  5,000 Units Subcutaneous Q8H  . insulin aspart  0-15 Units Subcutaneous Q4H  . loratadine  10 mg Oral Daily  . multivitamin  with minerals  1 tablet Oral Daily  . pantoprazole (PROTONIX) IV  40 mg Intravenous Daily  . saccharomyces boulardii  250 mg Oral BID   Continuous Infusions: . dextrose 5 % and 0.45% NaCl 50 mL/hr at 04/03/20 0127     LOS: 3 days    Time spent: 33 mins    Wyvonnia Dusky, MD [facility] Pager [phone]  If 7PM-7AM, please contact night-coverage [email or web address] 04/03/2020, 7:52 AM

## 2020-04-03 NOTE — Op Note (Signed)
[facility] Gastroenterology Patient Name: Brittney [name] Procedure Date: 04/03/2020 10:38 AM MRN: [id number] Account #: [id number] Date of Birth: 1941-05-12 Admit Type: Inpatient Age: 79 Room: [facility] ENDO ROOM 1 Gender: Female Note Status: Finalized Procedure:             Upper GI endoscopy Indications:           Abnormal CT of the GI tract Providers:             [name] MD, MD Referring MD:          Elisabeth Cara ., NP Medicines:             Monitored Anesthesia Care Complications:         No immediate complications. Procedure:             Pre-Anesthesia Assessment:                        - Prior to the procedure, a History and Physical was                         performed, and patient medications, allergies and                         sensitivities were reviewed. The patient's tolerance                         of previous anesthesia was reviewed.                        - The risks and benefits of the procedure and the                         sedation options and risks were discussed with the                         patient. All questions were answered and informed                         consent was obtained.                        - ASA Grade Assessment: II - A patient with mild                         systemic disease.                        After obtaining informed consent, the endoscope was                         passed under direct vision. Throughout the procedure,                         the patient's blood pressure, pulse, and oxygen                         saturations were monitored continuously. The Endoscope                         was introduced through the  mouth, and advanced to the                         third part of duodenum. The upper GI endoscopy was                         accomplished with ease. The patient tolerated the                         procedure well. Findings:      The esophagus was normal.      A medium amount of food  (residue) was found in the gastric body.      The cardia and gastric fundus were normal on retroflexion.      The exam was otherwise without abnormality.      Localized mild inflammation characterized by congestion (edema) and       erythema was found in the duodenal bulb. Biopsies were taken with a cold       forceps for histology.      A single 12 mm sessile polyp with no bleeding was found in the first       portion of the duodenum. Biopsies were taken with a cold forceps for       histology. Impression:            - Normal esophagus.                        - A medium amount of food (residue) in the stomach.                        - The examination was otherwise normal.                        - Duodenitis. Biopsied.                        - A single duodenal polyp. Biopsied. Recommendation:        - Return patient to hospital ward for ongoing care.                        - Advance diet as tolerated.                        - 1. Stop all NSAID's                        2. F/u path results and will be seen in GI clinic                        3. AtD/c send home on 6 weeks prilosec 40 mg/day Procedure Code(s):     --- Professional ---                        [phone], Esophagogastroduodenoscopy, flexible,                         transoral; with biopsy, single or multiple Diagnosis Code(s):     --- Professional ---  K29.80, Duodenitis without bleeding                        K31.7, Polyp of stomach and duodenum                        R93.3, Abnormal findings on diagnostic imaging of                         other parts of digestive tract CPT copyright 2019 [employer]. All rights reserved. The codes documented in this report are preliminary and upon coder review may  be revised to meet current compliance requirements. [name], MD [name] MD, MD 04/03/2020 10:53:16 AM This report has been signed electronically. Number of Addenda: 0 Note Initiated On:  04/03/2020 10:38 AM Estimated Blood Loss:  Estimated blood loss: none.      [facility]

## 2020-04-03 NOTE — Progress Notes (Signed)
[facility]  ROUNDING NOTE   Subjective:   Creatinine 2.2 (2.78)   D5 1/2 NS at 104mL/hr  EGD today by Dr. Vicente Males. Found to have duodenitis and duodenal polyp.   Patient admits to consuming Aspirins.   Objective:  Vital signs in last 24 hours:  Temp:  [97.6 F (36.4 C)-98.7 F (37.1 C)] 97.6 F (36.4 C) (04/08 1054) Pulse Rate:  [71-90] 89 (04/08 1121) Resp:  [16-26] 25 (04/08 1121) BP: (145-183)/(55-71) 175/71 (04/08 1121) SpO2:  [97 %-100 %] 98 % (04/08 1121) Weight:  [77.1 kg] 77.1 kg (04/08 1006)  Weight change:  Filed Weights   03/31/20 1401 04/03/20 1006  Weight: 77.1 kg 77.1 kg    Intake/Output: I/O last 3 completed shifts: In: 1247.4 [I.V.:1247.4] Out: 2150 [Urine:2150]   Intake/Output this shift:  Total I/O In: -  Out: 1000 [Urine:1000]  Physical Exam: General: NAD,   Head: Normocephalic, atraumatic. Moist oral mucosal membranes  Eyes: Anicteric, PERRL  Neck: Supple, trachea midline  Lungs:  Clear to auscultation  Heart: Regular rate and rhythm  Abdomen:  LLQ tenderness.   Extremities:  no peripheral edema.  Neurologic: Nonfocal, moving all four extremities  Skin: No lesions        Basic Metabolic Panel: Recent Labs  Lab 03/31/20 1402 03/31/20 1402 04/01/20 0446 04/02/20 0522 04/03/20 0553  NA 135  --  139 137 138  K 4.8  --  4.1 4.5 4.4  CL 101  --  105 105 105  CO2 23  --  23 22 25   GLUCOSE 274*  --  102* 123* 182*  BUN 62*  --  59* 45* 31*  CREATININE 3.66*  --  3.30* 2.78* 2.20*  CALCIUM 8.8*   < > 8.6* 8.8* 9.2   < > = values in this interval not displayed.    Liver Function Tests: Recent Labs  Lab 03/31/20 1402  AST 13*  ALT 12  ALKPHOS 39  BILITOT 0.8  PROT 7.9  ALBUMIN 4.0   Recent Labs  Lab 03/31/20 1402  LIPASE 37   No results for input(s): AMMONIA in the last 168 hours.  CBC: Recent Labs  Lab 03/31/20 1402 04/03/20 0553  WBC 10.7* 6.9  HGB 10.5* 9.1*  HCT 33.0* 27.6*  MCV 83.1 80.7  PLT  415* 383    Cardiac Enzymes: No results for input(s): CKTOTAL, CKMB, CKMBINDEX, TROPONINI in the last 168 hours.  BNP: Invalid input(s): POCBNP  CBG: Recent Labs  Lab 04/02/20 2332 04/03/20 0359 04/03/20 0728 04/03/20 1014 04/03/20 1200  GLUCAP 214* 206* 168* 162* 188*    Microbiology: Results for orders placed or performed during the hospital encounter of 03/31/20  Urine culture     Status: Abnormal   Collection Time: 03/31/20  2:57 PM   Specimen: Urine, Random  Result Value Ref Range Status   Specimen Description   Final    URINE, RANDOM Performed at [facility], [address], [place], [place] [address]    Special Requests   Final    NONE Performed at [facility], [address] [place], [place] [address]    Culture (A)  Final    >=100,000 COLONIES/mL MULTIPLE SPECIES PRESENT, SUGGEST RECOLLECTION   Report Status 04/02/2020 FINAL  Final  SARS CORONAVIRUS 2 (TAT 6-24 HRS) Nasopharyngeal Nasopharyngeal Swab     Status: None   Collection Time: 03/31/20  5:14 PM   Specimen: Nasopharyngeal Swab  Result Value Ref Range Status   SARS  Coronavirus 2 NEGATIVE NEGATIVE Final    Comment: (NOTE) SARS-CoV-2 target nucleic acids are NOT DETECTED. The SARS-CoV-2 RNA is generally detectable in upper and lower respiratory specimens during the acute phase of infection. Negative results do not preclude SARS-CoV-2 infection, do not rule out co-infections with other pathogens, and should not be used as the sole basis for treatment or other patient management decisions. Negative results must be combined with clinical observations, patient history, and epidemiological information. The expected result is Negative. Fact Sheet for Patients: SugarRoll.be Fact Sheet for Healthcare Providers: https://www.woods-mathews.com/ This test is not yet approved or cleared by the [place] FDA and  has been authorized for  detection and/or diagnosis of SARS-CoV-2 by FDA under an Emergency Use Authorization (EUA). This EUA will remain  in effect (meaning this test can be used) for the duration of the COVID-19 declaration under Section 56 4(b)(1) of the Act, 21 U.S.C. section 360bbb-3(b)(1), unless the authorization is terminated or revoked sooner. Performed at [facility], [place] [address], [place], [place] [address]     Coagulation Studies: No results for input(s): LABPROT, INR in the last 72 hours.  Urinalysis: Recent Labs    03/31/20 1457  COLORURINE YELLOW*  LABSPEC 1.015  PHURINE 5.0  GLUCOSEU NEGATIVE  HGBUR NEGATIVE  BILIRUBINUR NEGATIVE  KETONESUR NEGATIVE  PROTEINUR 100*  NITRITE NEGATIVE  LEUKOCYTESUR SMALL*      Imaging: DG Shoulder Right  Result Date: 04/02/2020 CLINICAL DATA:  Right shoulder pain. No known injury. EXAM: RIGHT SHOULDER - 2+ VIEW COMPARISON:  None. FINDINGS: [facility] joint and glenohumeral joints are fairly well maintained. No significant degenerative changes. No acute bony findings or abnormal soft tissue calcifications. The right lung is clear and the right ribs are intact per IMPRESSION: No acute bony findings or significant degenerative changes. Electronically Signed   By: Marijo Sanes M.D.   On: 04/02/2020 11:02     Medications:   . dextrose 5 % and 0.45% NaCl 50 mL/hr at 04/03/20 1217   . amLODipine  10 mg Oral Daily  . aspirin EC  81 mg Oral Daily  . atorvastatin  40 mg Oral Daily  . heparin  5,000 Units Subcutaneous Q8H  . insulin aspart  0-15 Units Subcutaneous Q4H  . loratadine  10 mg Oral Daily  . multivitamin with minerals  1 tablet Oral Daily  . pantoprazole (PROTONIX) IV  40 mg Intravenous Daily  . saccharomyces boulardii  250 mg Oral BID   acetaminophen, albuterol, nitroGLYCERIN, ondansetron  Assessment/ Plan:  Brittney [name] is a 79 y.o. black female with hypertension, systolic congestive heart failure, hyperlipidemia, diabetes  mellitus type II insulin dependent, aortic valve stenosis, who is admitted to [facility] on 03/31/2020 for AKI (acute kidney injury) ([place]) [N17.9] Abdominal pain [R10.9] Duodenitis [K29.80]   1. 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  Acute renal failure secondary to overdiuresis and poor po intake and possibly due to NSAIDs.  - holding home regimen of furosemide, spironolactone, and losartan.  - gentle IV fluids  2. Hypertension: well controlled. Holding agents as above.  - Continue amlodipine  3. Anemia with renal failure: with duodenitis. - started on PPI   LOS: 3 Brittney [name] 4/8/202112:47 PM

## 2020-04-04 LAB — BASIC METABOLIC PANEL
Anion gap: 9 (ref 5–15)
BUN: 22 mg/dL (ref 8–23)
CO2: 22 mmol/L (ref 22–32)
Calcium: 9.4 mg/dL (ref 8.9–10.3)
Chloride: 107 mmol/L (ref 98–111)
Creatinine, Ser: 1.65 mg/dL — ABNORMAL HIGH (ref 0.44–1.00)
GFR calc Af Amer: 34 mL/min — ABNORMAL LOW (ref >60)
GFR calc non Af Amer: 29 mL/min — ABNORMAL LOW (ref >60)
Glucose, Bld: 239 mg/dL — ABNORMAL HIGH (ref 70–99)
Potassium: 4.6 mmol/L (ref 3.5–5.1)
Sodium: 138 mmol/L (ref 135–145)

## 2020-04-04 LAB — CBC
HCT: 29.1 % — ABNORMAL LOW (ref 36.0–46.0)
Hemoglobin: 9.7 g/dL — ABNORMAL LOW (ref 12.0–15.0)
MCH: 26.8 pg (ref 26.0–34.0)
MCHC: 33.3 g/dL (ref 30.0–36.0)
MCV: 80.4 fL (ref 80.0–100.0)
Platelets: 417 10*3/uL — ABNORMAL HIGH (ref 150–400)
RBC: 3.62 MIL/uL — ABNORMAL LOW (ref 3.87–5.11)
RDW: 14.1 % (ref 11.5–15.5)
WBC: 9.4 10*3/uL (ref 4.0–10.5)
nRBC: 0 % (ref 0.0–0.2)

## 2020-04-04 LAB — GLUCOSE, CAPILLARY
Glucose-Capillary: 201 mg/dL — ABNORMAL HIGH (ref 70–99)
Glucose-Capillary: 234 mg/dL — ABNORMAL HIGH (ref 70–99)
Glucose-Capillary: 235 mg/dL — ABNORMAL HIGH (ref 70–99)

## 2020-04-04 LAB — SURGICAL PATHOLOGY

## 2020-04-04 MED ORDER — PANTOPRAZOLE SODIUM 40 MG PO TBEC: 40.0000 mg | DELAYED_RELEASE_TABLET | Freq: Every day | ORAL | 1 refills | Status: DC

## 2020-04-04 NOTE — Discharge Summary (Signed)
Physician Discharge Summary  Brittney [name] UTM:[id number] DOB: 1941-11-05 DOA: 03/31/2020  PCP: Brittney Cara, NP  Admit date: 03/31/2020 Discharge date: 04/04/2020  Admitted From: home Disposition: home  Recommendations for Outpatient Follow-up:  1. Follow up with PCP in 1-2 weeks 2. F/u GI, Dr. Vicente [name], in 2 weeks 3. F/u nephro, Dr. Juleen [name], in 2 weeks-1 month  Home Health: no Equipment/Devices:  Discharge Condition: stable CODE STATUS: full  Diet recommendation: Heart Healthy / Carb Modified Brief/Interim Summary: HPI was taken from Brittney [name] is a 79 y.o. female with medical history significant of  Brittney [name] a 79 y.o.femalewith medical history significant ofhypertension, hyperlipidemia, diabetes mellitus, GERD,sCHF with EF of 45-50%, CAD with being 60 to 65% on echo done on 3/21 presents complaining of abdominal pain which started 5 days ago.  Abdominal pain is located at the lower quadrants.  No nausea or vomiting.  Has tolerated her p.o. intake including liquid intake.  Denies any fever, chills, or any other symptoms.  ED Course: In ED she had a CT of abdomen and pelvis revealing Severe bowel wall thickening of the first portion of the duodenum with surrounding inflammatory changes which may be related to a duodenal ulcer or severe duodenitis secondary to an infectious or inflammatory etiology. No extraluminal gas to suggest perforation.  Dr. Morey [name] ED Dr. Damaris [name] to GI Dr. Vicente [name] who stated usually these are from peptic ulcer and rarely infectious.  Can hold off on antibiotics and can start PPI.  Also obtain stool study for H. Pylori.  Creatinine at 3.66, glucose 274, WBC 10.7, hemoglobin 10.5 hematocrit 33.0  Hospital Course from Dr. Lenna [name]. Brittney [name] 4/7-04/04/20: Pt presented w/ abd pain found to be secondary to duodenitis. Pt was placed on IV protonix. Pt had EGD that showed duodenitis & duodenal polyp and biopsies were taken. Path was pending at time of d/c. Pt  was d/c home w/ PPI x 6 weeks as per GI. Pt knows not to take any NSAIDs. Of note, pt was found to have AKI on CKD and pt was placed on IVFs and home doses of spironolactone, lasix, losartan were held. Pt did require PT/OT as pt was independent w/ ambulating and transfers. For more information please see previous progress notes.   Discharge Diagnoses:  Active Problems:   Abdominal pain  Abd pain: secondary duodenal ulcer/severe duodenitis found on CT. Continue on IV protonix. Continue on IVFs. EGD today as per GI. GI following and recs apprec   AKI on CKDIIIb: possibly due to prerenaldue overdiuresis. Cr continues to trend down daily. Continue to hold spironolactone, lasix & losartan. Continue on IVFs. Nephro following and recs apprec  DM2: with renal manifestations. Most recent A1c 8.4, poorly controlled. Continue on SSI w/ accuchecks   CAD: stable. No chest pain. Continue on metoprolol & continue to hold imdur. Continue on statin and aspirin  Essential hypertension: continue amlodipine, metoprolol. Hold hydralazine, lasix, spironolactone, cozaar  Chronic systolic heart failure: echo from March 2021 with normal EF now. Euvolemic   Discharge Instructions  Discharge Instructions    Diet - low sodium heart healthy   Complete by: As directed    Diet Carb Modified   Complete by: As directed    Discharge instructions   Complete by: As directed    F/u PCP in 1-2 weeks; F/u GI (Dr. Vicente [name]) in 2 weeks to get pathology results; F/u nephro (Dr. Abigail [name]) in 2 weeks; Do NOT take any NSAIDs   Increase activity slowly  Complete by: As directed      Allergies as of 04/04/2020      Reactions   Accupril [quinapril Hcl] Other (See Comments)   Reaction: unknown      Medication List    STOP taking these medications   omeprazole 20 MG capsule Commonly known as: PRILOSEC     TAKE these medications   acetaminophen 325 MG tablet Commonly known as: TYLENOL Take 2 tablets (650 mg total)  by mouth every 6 (six) hours as needed for mild pain (or Fever >/= 101).   albuterol 108 (90 Base) MCG/ACT inhaler Commonly known as: VENTOLIN HFA Inhale 2 puffs into the lungs every 6 (six) hours as needed for wheezing.   amLODipine 10 MG tablet Commonly known as: NORVASC Take 10 mg by mouth daily.   aspirin 81 MG EC tablet Take 1 tablet (81 mg total) by mouth daily.   aspirin-sod bicarb-citric acid 325 MG Tbef tablet Commonly known as: ALKA-SELTZER Take 325 mg by mouth every 6 (six) hours as needed.   atorvastatin 40 MG tablet Commonly known as: LIPITOR Take 40 mg by mouth daily.   bismuth subsalicylate 412 IN/86VE suspension Commonly known as: PEPTO BISMOL Take 30 mLs by mouth every 6 (six) hours as needed.   cetirizine 10 MG tablet Commonly known as: ZYRTEC Take 10 mg by mouth daily.   Choline Fenofibrate 45 MG capsule Take 45 mg by mouth daily.   furosemide 40 MG tablet Commonly known as: LASIX Take 40 mg by mouth 2 (two) times daily.   glipiZIDE 10 MG 24 hr tablet Commonly known as: GLUCOTROL XL Take 10 mg by mouth 2 (two) times daily.   hydrALAZINE 50 MG tablet Commonly known as: APRESOLINE Take 50 mg by mouth 3 (three) times daily.   isosorbide mononitrate 30 MG 24 hr tablet Commonly known as: IMDUR Take 30 mg by mouth 3 (three) times daily.   Lantus SoloStar 100 UNIT/ML Solostar Pen Generic drug: insulin glargine Inject 16 Units into the skin at bedtime.   metFORMIN 1000 MG tablet Commonly known as: GLUCOPHAGE Take 1,000 mg by mouth 2 (two) times daily with a meal.   metoprolol tartrate 50 MG tablet Commonly known as: LOPRESSOR Take 50 mg by mouth 2 (two) times daily.   multivitamin with minerals Tabs tablet Take 1 tablet by mouth daily.   nitroGLYCERIN 0.4 MG SL tablet Commonly known as: NITROSTAT Place 1 tablet (0.4 mg total) under the tongue every 5 (five) minutes as needed for chest pain.   pantoprazole 40 MG tablet Commonly known as:  PROTONIX Take 1 tablet (40 mg total) by mouth daily. What changed: when to take this   saccharomyces boulardii 250 MG capsule Commonly known as: FLORASTOR Take 250 mg by mouth 2 (two) times daily.   sitaGLIPtin 100 MG tablet Commonly known as: JANUVIA Take 100 mg by mouth daily.   spironolactone 25 MG tablet Commonly known as: ALDACTONE Take 12.5 mg by mouth 2 (two) times daily.      Follow-up Information    Brittney Cara, NP Follow up.   Specialty: Nurse Practitioner Why: Please schedule PCP follow-up within 5-7 days of discharge date. Contact information: [place] [address] [phone]        [name], MD Follow up in 2 week(s).   Specialty: Gastroenterology Contact information: [place] [address] [phone]        Lavonia Dana, MD Follow up in 2 week(s).  Specialty: Nephrology Contact information: [address] [place] [address] [phone]          Allergies  Allergen Reactions  . Accupril [Quinapril Hcl] Other (See Comments)    Reaction: unknown    Consultations:  GI, Dr. Merrie Roof, Dr. Juleen [name]   Procedures/Studies: CT ABDOMEN PELVIS WO CONTRAST  Result Date: 03/31/2020 CLINICAL DATA:  Abdominal distension, lower abdominal pain EXAM: CT ABDOMEN AND PELVIS WITHOUT CONTRAST TECHNIQUE: Multidetector CT imaging of the abdomen and pelvis was performed following the standard protocol without IV contrast. COMPARISON:  06/24/2019 FINDINGS: Lower chest: No acute abnormality. Hepatobiliary: No focal liver abnormality is seen. Cholelithiasis. No biliary ductal dilatation. Pancreas: Unremarkable. No pancreatic ductal dilatation or surrounding inflammatory changes. Spleen: Normal in size without focal abnormality. Adrenals/Urinary Tract: Adrenal glands are unremarkable. Kidneys are normal, without renal calculi, focal lesion, or hydronephrosis. Bladder is  unremarkable. Stomach/Bowel: Severe bowel wall thickening of the first portion of the duodenum with surrounding inflammatory changes which may be related to a duodenal ulcer or severe duodenitis secondary to an infectious or inflammatory etiology. No extraluminal gas to suggest perforation. No bowel dilatation to suggest obstruction. Vascular/Lymphatic: Normal caliber abdominal aorta with severe atherosclerosis. No lymphadenopathy. Reproductive: Uterus and bilateral adnexa are unremarkable. Other: No abdominal wall hernia or abnormality. No abdominopelvic ascites. Musculoskeletal: Degenerative disease with disc height loss at L5-S1 with bilateral facet arthropathy and bilateral foraminal stenosis. Grade 1 anterolisthesis of L4 on L5 secondary to facet disease. IMPRESSION: 1. Severe bowel wall thickening of the first portion of the duodenum with surrounding inflammatory changes which may be related to a duodenal ulcer or severe duodenitis secondary to an infectious or inflammatory etiology. No extraluminal gas to suggest perforation. 2.  Aortic Atherosclerosis (ICD10-I70.0). Electronically Signed   By: Kathreen Devoid   On: 03/31/2020 16:24   DG Chest 2 View  Result Date: 03/18/2020 CLINICAL DATA:  Chest pain EXAM: CHEST - 2 VIEW COMPARISON:  02/01/2020 FINDINGS: Heart is normal size. Aortic atherosclerosis. Lungs are clear. No effusions. No acute bony abnormality. IMPRESSION: Aortic atherosclerosis.  No acute cardiopulmonary disease. Electronically Signed   By: Rolm Baptise M.D.   On: 03/18/2020 21:41   DG Shoulder Right  Result Date: 04/02/2020 CLINICAL DATA:  Right shoulder pain. No known injury. EXAM: RIGHT SHOULDER - 2+ VIEW COMPARISON:  None. FINDINGS: [facility] joint and glenohumeral joints are fairly well maintained. No significant degenerative changes. No acute bony findings or abnormal soft tissue calcifications. The right lung is clear and the right ribs are intact per IMPRESSION: No acute bony findings  or significant degenerative changes. Electronically Signed   By: Marijo Sanes M.D.   On: 04/02/2020 11:02   ECHOCARDIOGRAM COMPLETE  Result Date: 03/20/2020    ECHOCARDIOGRAM REPORT   Patient Name:   LOCHLYN ZULLO Date of Exam: 03/19/2020 Medical Rec #:  [id number]        Height:       67.0 in Accession #:    [id number]       Weight:       172.0 lb Date of Birth:  [birth year]       BSA:          1.897 m Patient Age:    63 years         BP:           145/74 mmHg Patient Gender: F  HR:           77 bpm. Exam Location:  [facility] Procedure: 2D Echo, Cardiac Doppler and Color Doppler Indications:     Chest pain 786.50  History:         Patient has prior history of Echocardiogram examinations, most                  recent 01/02/2019. CHF, Signs/Symptoms:Murmur; Risk                  Factors:Hypertension and Diabetes.  Sonographer:     Sherrie Sport RDCS (AE) Referring Phys:  [id number] Soledad Gerlach NIU Diagnosing Phys: Neoma Laming MD IMPRESSIONS  1. Left ventricular ejection fraction, by estimation, is 60 to 65%. The left ventricle has normal function. The left ventricle has no regional wall motion abnormalities. Left ventricular diastolic parameters are consistent with Grade I diastolic dysfunction (impaired relaxation).  2. Right ventricular systolic function is normal. The right ventricular size is normal. There is mildly elevated pulmonary artery systolic pressure.  3. The mitral valve is normal in structure. Mild mitral valve regurgitation. No evidence of mitral stenosis.  4. The aortic valve is normal in structure. Aortic valve regurgitation is not visualized. No aortic stenosis is present.  5. The inferior vena cava is normal in size with greater than 50% respiratory variability, suggesting right atrial pressure of 3 mmHg. FINDINGS  Left Ventricle: Left ventricular ejection fraction, by estimation, is 60 to 65%. The left ventricle has normal function. The left ventricle has no regional wall motion abnormalities. The  left ventricular internal cavity size was normal in size. There is  no left ventricular hypertrophy. Left ventricular diastolic parameters are consistent with Grade I diastolic dysfunction (impaired relaxation). Right Ventricle: The right ventricular size is normal. No increase in right ventricular wall thickness. Right ventricular systolic function is normal. There is mildly elevated pulmonary artery systolic pressure. The tricuspid regurgitant velocity is 2.31  m/s, and with an assumed right atrial pressure of 10 mmHg, the estimated right ventricular systolic pressure is 73.4 mmHg. Left Atrium: Left atrial size was normal in size. Right Atrium: Right atrial size was normal in size. Pericardium: There is no evidence of pericardial effusion. Mitral Valve: The mitral valve is normal in structure. Normal mobility of the mitral valve leaflets. Mild mitral valve regurgitation. No evidence of mitral valve stenosis. Tricuspid Valve: The tricuspid valve is normal in structure. Tricuspid valve regurgitation is trivial. No evidence of tricuspid stenosis. Aortic Valve: The aortic valve is normal in structure. Aortic valve regurgitation is not visualized. No aortic stenosis is present. Aortic valve mean gradient measures 6.0 mmHg. Aortic valve peak gradient measures 9.9 mmHg. Aortic valve area, by VTI measures 2.37 cm. Pulmonic Valve: The pulmonic valve was normal in structure. Pulmonic valve regurgitation is not visualized. No evidence of pulmonic stenosis. Aorta: The aortic root is normal in size and structure. Venous: The inferior vena cava is normal in size with greater than 50% respiratory variability, suggesting right atrial pressure of 3 mmHg. IAS/Shunts: No atrial level shunt detected by color flow Doppler.  LEFT VENTRICLE PLAX 2D LVIDd:         5.05 cm  Diastology LVIDs:         3.15 cm  LV e' lateral:   10.10 cm/s LV PW:         0.94 cm  LV E/e' lateral: 12.2 LV IVS:        0.80 cm  LV e' medial:  5.66 cm/s LVOT  diam:     2.20 cm  LV E/e' medial:  21.7 LV SV:         88 LV SV Index:   46 LVOT Area:     3.80 cm  RIGHT VENTRICLE RV Basal diam:  3.06 cm RV S prime:     12.80 cm/s TAPSE (M-mode): 3.5 cm LEFT ATRIUM             Index       RIGHT ATRIUM           Index LA diam:        3.80 cm 2.00 cm/m  RA Area:     15.80 cm LA Vol (A2C):   66.8 ml 35.22 ml/m RA Volume:   37.80 ml  19.93 ml/m LA Vol (A4C):   89.8 ml 47.35 ml/m LA Biplane Vol: 81.0 ml 42.71 ml/m  AORTIC VALVE                    PULMONIC VALVE AV Area (Vmax):    2.14 cm     PV Vmax:        0.98 m/s AV Area (Vmean):   2.24 cm     PV Peak grad:   3.8 mmHg AV Area (VTI):     2.37 cm     RVOT Peak grad: 5 mmHg AV Vmax:           157.67 cm/s AV Vmean:          114.000 cm/s AV VTI:            0.371 m AV Peak Grad:      9.9 mmHg AV Mean Grad:      6.0 mmHg LVOT Vmax:         88.70 cm/s LVOT Vmean:        67.300 cm/s LVOT VTI:          0.232 m LVOT/AV VTI ratio: 0.62  AORTA Ao Root diam: 2.90 cm MITRAL VALVE                TRICUSPID VALVE MV Area (PHT): 2.82 cm     TR Peak grad:   21.3 mmHg MV Decel Time: 269 msec     TR Vmax:        231.00 cm/s MV E velocity: 123.00 cm/s MV A velocity: 138.00 cm/s  SHUNTS MV E/A ratio:  0.89         Systemic VTI:  0.23 m                             Systemic Diam: 2.20 cm Neoma Laming MD Electronically signed by Neoma Laming MD Signature Date/Time: 03/20/2020/9:38:09 AM    Final      Subjective: Pt denies any complaints   Discharge Exam: Vitals:   04/03/20 2201 04/04/20 0416  BP: (!) 144/79 (!) 147/55  Pulse: 77 65  Resp:  20  Temp:  98 F (36.7 C)  SpO2: 99% 98%   Vitals:   04/03/20 1934 04/03/20 2200 04/03/20 2201 04/04/20 0416  BP: (!) 144/77 (!) 144/79 (!) 144/79 (!) 147/55  Pulse: 76 78 77 65  Resp: 20   20  Temp: 98.1 F (36.7 C)   98 F (36.7 C)  TempSrc: Oral   Oral  SpO2: 99%  99% 98%  Weight:      Height:  General: Pt is alert, awake, not in acute distress Cardiovascular:S1/S2 +, no  rubs, no gallops Respiratory: CTA bilaterally, no wheezing, no rhonchi Abdominal: Soft, NT, ND, bowel sounds + Extremities:, no cyanosis    The results of significant diagnostics from this hospitalization (including imaging, microbiology, ancillary and laboratory) are listed below for reference.     Microbiology: Recent Results (from the past 240 hour(s))  Urine culture     Status: Abnormal   Collection Time: 03/31/20  2:57 PM   Specimen: Urine, Random  Result Value Ref Range Status   Specimen Description   Final    URINE, RANDOM Performed at [facility], [address] [place], [place] [address]    Special Requests   Final    NONE Performed at [facility], [address] [place], [place] [address]    Culture (A)  Final    >=100,000 COLONIES/mL MULTIPLE SPECIES PRESENT, SUGGEST RECOLLECTION   Report Status 04/02/2020 FINAL  Final  SARS CORONAVIRUS 2 (TAT 6-24 HRS) Nasopharyngeal Nasopharyngeal Swab     Status: None   Collection Time: 03/31/20  5:14 PM   Specimen: Nasopharyngeal Swab  Result Value Ref Range Status   SARS Coronavirus 2 NEGATIVE NEGATIVE Final    Comment: (NOTE) SARS-CoV-2 target nucleic acids are NOT DETECTED. The SARS-CoV-2 RNA is generally detectable in upper and lower respiratory specimens during the acute phase of infection. Negative results do not preclude SARS-CoV-2 infection, do not rule out co-infections with other pathogens, and should not be used as the sole basis for treatment or other patient management decisions. Negative results must be combined with clinical observations, patient history, and epidemiological information. The expected result is Negative. Fact Sheet for Patients: SugarRoll.be Fact Sheet for Healthcare Providers: https://www.woods-mathews.com/ This test is not yet approved or cleared by the [place] FDA and  has been authorized for detection and/or diagnosis  of SARS-CoV-2 by FDA under an Emergency Use Authorization (EUA). This EUA will remain  in effect (meaning this test can be used) for the duration of the COVID-19 declaration under Section 56 4(b)(1) of the Act, 21 U.S.C. section 360bbb-3(b)(1), unless the authorization is terminated or revoked sooner. Performed at [facility], [place] [address], [place], [place] [address]      Labs: BNP (last 3 results) Recent Labs    02/01/20 0445 02/02/20 0559 03/19/20 1028  BNP 190.0* 247.0* 742.5*   Basic Metabolic Panel: Recent Labs  Lab 03/31/20 1402 04/01/20 0446 04/02/20 0522 04/03/20 0553 04/04/20 0432  NA 135 139 137 138 138  K 4.8 4.1 4.5 4.4 4.6  CL 101 105 105 105 107  CO2 23 23 22 25 22   GLUCOSE 274* 102* 123* 182* 239*  BUN 62* 59* 45* 31* 22  CREATININE 3.66* 3.30* 2.78* 2.20* 1.65*  CALCIUM 8.8* 8.6* 8.8* 9.2 9.4   Liver Function Tests: Recent Labs  Lab 03/31/20 1402  AST 13*  ALT 12  ALKPHOS 39  BILITOT 0.8  PROT 7.9  ALBUMIN 4.0   Recent Labs  Lab 03/31/20 1402  LIPASE 37   No results for input(s): AMMONIA in the last 168 hours. CBC: Recent Labs  Lab 03/31/20 1402 04/03/20 0553 04/04/20 0432  WBC 10.7* 6.9 9.4  HGB 10.5* 9.1* 9.7*  HCT 33.0* 27.6* 29.1*  MCV 83.1 80.7 80.4  PLT 415* 383 417*   Cardiac Enzymes: No results for input(s): CKTOTAL, CKMB, CKMBINDEX, TROPONINI in the last 168 hours. BNP: Invalid input(s): POCBNP CBG: Recent Labs  Lab 04/03/20 1611 04/03/20 1935 04/04/20 0002 04/04/20 0414 04/04/20 0753  GLUCAP 256* 199* 234* 235* 201*   D-Dimer No results for input(s): DDIMER in the last 72 hours. Hgb A1c No results for input(s): HGBA1C in the last 72 hours. Lipid Profile No results for input(s): CHOL, HDL, LDLCALC, TRIG, CHOLHDL, LDLDIRECT in the last 72 hours. Thyroid function studies No results for input(s): TSH, T4TOTAL, T3FREE, THYROIDAB in the last 72 hours.  Invalid input(s): FREET3 Anemia work up No  results for input(s): VITAMINB12, FOLATE, FERRITIN, TIBC, IRON, RETICCTPCT in the last 72 hours. Urinalysis    Component Value Date/Time   COLORURINE YELLOW (A) 03/31/2020 1457   APPEARANCEUR HAZY (A) 03/31/2020 1457   LABSPEC 1.015 03/31/2020 1457   PHURINE 5.0 03/31/2020 1457   GLUCOSEU NEGATIVE 03/31/2020 1457   HGBUR NEGATIVE 03/31/2020 1457   [place] 03/31/2020 [place] 03/31/2020 1457   PROTEINUR 100 (A) 03/31/2020 1457   NITRITE NEGATIVE 03/31/2020 1457   LEUKOCYTESUR SMALL (A) 03/31/2020 1457   Sepsis Labs Invalid input(s): PROCALCITONIN,  WBC,  LACTICIDVEN Microbiology Recent Results (from the past 240 hour(s))  Urine culture     Status: Abnormal   Collection Time: 03/31/20  2:57 PM   Specimen: Urine, Random  Result Value Ref Range Status   Specimen Description   Final    URINE, RANDOM Performed at [facility], [address], [place], [place] [address]    Special Requests   Final    NONE Performed at [facility], [address] [place], [place] [address]    Culture (A)  Final    >=100,000 COLONIES/mL MULTIPLE SPECIES PRESENT, SUGGEST RECOLLECTION   Report Status 04/02/2020 FINAL  Final  SARS CORONAVIRUS 2 (TAT 6-24 HRS) Nasopharyngeal Nasopharyngeal Swab     Status: None   Collection Time: 03/31/20  5:14 PM   Specimen: Nasopharyngeal Swab  Result Value Ref Range Status   SARS Coronavirus 2 NEGATIVE NEGATIVE Final    Comment: (NOTE) SARS-CoV-2 target nucleic acids are NOT DETECTED. The SARS-CoV-2 RNA is generally detectable in upper and lower respiratory specimens during the acute phase of infection. Negative results do not preclude SARS-CoV-2 infection, do not rule out co-infections with other pathogens, and should not be used as the sole basis for treatment or other patient management decisions. Negative results must be combined with clinical observations, patient history, and epidemiological information.  The expected result is Negative. Fact Sheet for Patients: SugarRoll.be Fact Sheet for Healthcare Providers: https://www.woods-mathews.com/ This test is not yet approved or cleared by the [place] FDA and  has been authorized for detection and/or diagnosis of SARS-CoV-2 by FDA under an Emergency Use Authorization (EUA). This EUA will remain  in effect (meaning this test can be used) for the duration of the COVID-19 declaration under Section 56 4(b)(1) of the Act, 21 U.S.C. section 360bbb-3(b)(1), unless the authorization is terminated or revoked sooner. Performed at [facility], [place] [address] [place], [place] [address]      Time coordinating discharge: Over 30 minutes  SIGNED:   Wyvonnia Dusky, MD  [facility] 04/04/2020, 9:00 AM Pager   If 7PM-7AM, please contact night-coverage [email or web address]

## 2020-04-04 NOTE — TOC Transition Note (Signed)
Transition of Care [facility]) - CM/SW Discharge Note   Patient Details  Name: Brittney [name] MRN: [id number] Date of Birth: 10-Aug-1941  [facility] CM/SW Contact:  Magnus Ivan, LCSW Phone Number: 04/04/2020, 11:44 AM   Clinical Narrative:   Patient has orders to discharge home today. Family is supportive. No needs identified. CSW signing off.    Final next level of care: Home/Self Care Barriers to Discharge: Barriers Resolved   Patient Goals and CMS Choice        Discharge Placement                Patient to be transferred to facility by: Family Name of family member notified: Patient aware Patient and family notified of of transfer: 04/04/20  Discharge Plan and Services                                     Social Determinants of Health (SDOH) Interventions     Readmission Risk Interventions Readmission Risk Prevention Plan 04/02/2020 08/10/2018  Transportation Screening Complete Complete  PCP or Specialist Appt within 5-7 Days - Complete  PCP or Specialist Appt within 3-5 Days Complete -  Home Care Screening - Complete  Medication Review (RN CM) - Complete  HRI or Home Care Consult Complete -  Medication Review (RN Care Manager) Complete -  Some recent data might be hidden

## 2020-04-04 NOTE — Progress Notes (Signed)
Patient ID: Brittney [name], female    DOB: [birth year], 79 y.o.   MRN: [id number]  HPI  Brittney [name] is a 79 y/o female with a history of DM, hyperlipidemia, HTN, CKD, GERD, vitamin D deficiency, previous tobacco use and chronic heart failure.   Echo report from 03/19/20 reviewed and showed an EF of 60-65% along with mildly elevated PA pressure and mild MR. Echo report from 01/02/2019 reviewed and showed an EF of 45-50%.  Catheterization done 01/23/2019: EF >65%  Mid RCA lesion is 99% stenosed.  Prox RCA to Mid RCA lesion is 25% stenosed.  Prox LAD lesion is 70% stenosed.  Mid LAD lesion is 70% stenosed.  Ost Cx to Mid Cx lesion is 15% stenosed.  The left ventricular systolic function is normal.  LV end diastolic pressure is normal.  The left ventricular ejection fraction is greater than 65% by visual estimate.  Admitted 03/31/20 due to abdominal pain due to duodenitis. GI consult obtained. Pelvic and abdominal CT obtained. EGD done. IV protonix given. Given IVF's due to AKI. Discharged after 4 days. Admitted 03/19/20 due to chest pain. Cardiology consult obtained. No medication adjustment. Discharged the following day. Admitted 02/01/20 due to acute on chronic HF. Cardiology and nephrology consults obtained. Initially given IV lasix and then transitioned to oral diuretics with change to BID dosing (was daily). Lost ~ 4L during admission. Renal ultrasound unrevealing. Losartan held due to renal function.  Discharged after 2 days.    Brittney [name] presents today for a follow-up visit with a chief complaint of minimal shortness of breath upon moderate exertion. Brittney [name] describes this as chronic in nature having been present for several years. Brittney [name] has associated fatigue along with this. Brittney [name] denies any difficulty sleeping, dizziness, abdominal distention, palpitations, pedal edema, chest pain, cough or weight gain.   Past Medical History:  Diagnosis Date  . Aortic valve stenosis   . Carotid bruit   . CHF  (congestive heart failure) ([place])   . Chronic kidney disease   . Diabetes mellitus without complication ([place])   . GERD (gastroesophageal reflux disease)   . Heart murmur   . Hyperlipidemia   . Hypertension   . Pneumonia   . Vitamin D deficiency    Past Surgical History:  Procedure Laterality Date  . COLONOSCOPY    . COLONOSCOPY WITH PROPOFOL N/A 08/02/2019   Procedure: COLONOSCOPY WITH PROPOFOL;  Surgeon: Virgel Manifold, MD;  Location: [facility] ENDOSCOPY;  Service: Endoscopy;  Laterality: N/A;  . CORONARY ANGIOPLASTY    . ESOPHAGOGASTRODUODENOSCOPY N/A 06/26/2019   Procedure: ESOPHAGOGASTRODUODENOSCOPY (EGD);  Surgeon: Virgel Manifold, MD;  Location: [facility] ENDOSCOPY;  Service: Endoscopy;  Laterality: N/A;  . ESOPHAGOGASTRODUODENOSCOPY (EGD) WITH PROPOFOL N/A 08/02/2019   Procedure: ESOPHAGOGASTRODUODENOSCOPY (EGD) WITH PROPOFOL;  Surgeon: Virgel Manifold, MD;  Location: [facility] ENDOSCOPY;  Service: Endoscopy;  Laterality: N/A;  . ESOPHAGOGASTRODUODENOSCOPY (EGD) WITH PROPOFOL N/A 04/03/2020   Procedure: ESOPHAGOGASTRODUODENOSCOPY (EGD) WITH PROPOFOL;  Surgeon: [name], MD;  Location: [facility] ENDOSCOPY;  Service: Gastroenterology;  Laterality: N/A;  . LEFT HEART CATH AND CORONARY ANGIOGRAPHY N/A 01/23/2019   Procedure: LEFT HEART CATH AND CORONARY ANGIOGRAPHY;  Surgeon: Yolonda Kida, MD;  Location: [place] CV LAB;  Service: Cardiovascular;  Laterality: N/A;  . ovarian cyst removal     Family History  Problem Relation Age of Onset  . Diabetes Neg Hx   . Hypertension Neg Hx   . Breast cancer Neg Hx    Social History   Tobacco Use  .  Smoking status: Former Smoker    Packs/day: 1.00    Years: 40.00    Pack years: 40.00    Types: Cigarettes    Quit date: 07/27/2018    Years since quitting: 1.6  . Smokeless tobacco: Never Used  Substance Use Topics  . Alcohol use: No   Allergies  Allergen Reactions  . Accupril [Quinapril Hcl] Other (See Comments)    Reaction:  unknown   Prior to Admission medications   Medication Sig Start Date End Date Taking? Authorizing Provider  albuterol (VENTOLIN HFA) 108 (90 Base) MCG/ACT inhaler Inhale 2 puffs into the lungs every 6 (six) hours as needed for wheezing.   Yes [provider]  amLODipine (NORVASC) 10 MG tablet Take 10 mg by mouth daily.   Yes [provider]  aspirin EC 81 MG EC tablet Take 1 tablet (81 mg total) by mouth daily. 06/27/19  Yes Gouru, Illene Silver, MD  atorvastatin (LIPITOR) 40 MG tablet Take 40 mg by mouth daily.  07/04/18  Yes [provider]  bismuth subsalicylate (PEPTO BISMOL) 262 MG/15ML suspension Take 30 mLs by mouth every 6 (six) hours as needed.   Yes [provider]  cetirizine (ZYRTEC) 10 MG tablet Take 10 mg by mouth every other day.    Yes [provider]  Choline Fenofibrate 45 MG capsule Take 45 mg by mouth daily.    Yes [provider]  furosemide (LASIX) 40 MG tablet Take 40 mg by mouth 2 (two) times daily.   Yes [provider]  glipiZIDE (GLUCOTROL XL) 10 MG 24 hr tablet Take 10 mg by mouth 2 (two) times daily.   Yes [provider]  hydrALAZINE (APRESOLINE) 50 MG tablet Take 50 mg by mouth 3 (three) times daily.   Yes [provider]  isosorbide mononitrate (IMDUR) 30 MG 24 hr tablet Take 30 mg by mouth 3 (three) times daily.    Yes [provider]  LANTUS SOLOSTAR 100 UNIT/ML Solostar Pen Inject 16 Units into the skin at bedtime. 10/19/19  Yes [provider]  losartan (COZAAR) 100 MG tablet Take 100 mg by mouth daily.   Yes [provider]  metFORMIN (GLUCOPHAGE) 1000 MG tablet Take 1,000 mg by mouth 2 (two) times daily with a meal.    Yes [provider]  metoprolol tartrate (LOPRESSOR) 50 MG tablet Take 50 mg by mouth 2 (two) times daily.   Yes [provider]  Multiple Vitamin (MULTIVITAMIN WITH MINERALS) TABS tablet Take 1 tablet by mouth daily.   Yes [provider]  nitroGLYCERIN (NITROSTAT) 0.4 MG SL tablet Place 1 tablet (0.4 mg total) under the tongue every 5 (five) minutes as needed for chest pain. 03/20/20  Yes Nicole Kindred A, DO  pantoprazole (PROTONIX) 40 MG tablet Take 1 tablet (40 mg total) by mouth daily. 04/04/20 05/04/20 Yes Wyvonnia Dusky, MD  sitaGLIPtin (JANUVIA) 100 MG tablet Take 100 mg by mouth daily.   Yes [provider]  spironolactone (ALDACTONE) 25 MG tablet Take 12.5 mg by mouth 2 (two) times daily.  02/07/20  Yes [provider]     Review of Systems  Constitutional: Positive for fatigue. Negative for appetite change.  HENT: Positive for sneezing. Negative for congestion and postnasal drip.   Eyes: Negative.   Respiratory: Positive for shortness of breath. Negative for cough.   Cardiovascular: Negative for chest pain, palpitations and leg swelling.  Gastrointestinal: Negative for abdominal distention and abdominal pain.  Endocrine: Negative.  Genitourinary: Negative.   Musculoskeletal: Negative for back pain.  Skin: Negative.   Allergic/Immunologic: Negative.   Neurological: Negative for dizziness and light-headedness.  Hematological: Negative for adenopathy. Does not bruise/bleed easily.  Psychiatric/Behavioral: Negative for dysphoric mood and sleep disturbance (sleeping on 1 pillow). The patient is not nervous/anxious.    Vitals:   04/07/20 0957  BP: (!) 139/46  Pulse: 62  Resp: 18  SpO2: 100%  Weight: 172 lb (78 kg)  Height: 5\' 7"  (1.702 m)   Wt Readings from Last 3 Encounters:  04/07/20 172 lb (78 kg)  04/03/20 170 lb (77.1 kg)  03/19/20 170 lb 8 oz (77.3 kg)   Lab Results  Component Value Date   CREATININE 1.65 (H) 04/04/2020   CREATININE 2.20 (H) 04/03/2020   CREATININE 2.78 (H) 04/02/2020     Physical Exam Vitals and nursing note reviewed.  Constitutional:      Appearance: Normal appearance.  HENT:     Head: Normocephalic and atraumatic.  Cardiovascular:      Rate and Rhythm: Normal rate and regular rhythm.  Pulmonary:     Effort: Pulmonary effort is normal.     Breath sounds: No wheezing or rales.  Abdominal:     General: There is no distension.     Palpations: Abdomen is soft.  Musculoskeletal:        General: No tenderness.     Cervical back: Normal range of motion and neck supple.     Right lower leg: No edema.     Left lower leg: No edema.  Skin:    General: Skin is warm and dry.  Neurological:     General: No focal deficit present.     Mental Status: Brittney [name] is alert and oriented to person, place, and time.  Psychiatric:        Mood and Affect: Mood normal.        Behavior: Behavior normal.        Thought Content: Thought content normal.   Assessment & Plan:  1: Chronic heart failure with preserved ejection fraction- - NYHA class II - euvolemic today - weighing daily; reminded to call for an overnight weight gain of >2 pounds or a weekly weight gain of >5 pounds - weight up 6 pounds from last visit here 2 months ago - not adding salt to Brittney [name] food and continues to follow a low sodium diet - saw cardiology Dema Severin) 03/27/20 - BNP 03/19/20 was 112.0 - PharmD reconciled medications with patient - due to frequent admissions, will refer to paramedicine program; patient and daughter in agreement and a brochure was given to them about this  2: HTN- - BP looks good today - follows with PCP Frederico Hamman) at [place] returns in 2 days - BMP from 04/04/20 reviewed and showed sodium 138, potassium 4.6, creatinine 1.65 and GFR 34  3: Diabetes- - nonfasting glucose in clinic today was 240 - A1c 03/20/20 was 8.6% - saw nephrology ([place]) 02/26/20 & returns tomorrow   Medication list reviewed.   Return in 6 months or sooner for any questions/problems before then.

## 2020-04-04 NOTE — Progress Notes (Signed)
Discharge instructions reviewed with the patient. Patient being sent out via wheelchair with belongings to family waiting car.

## 2020-04-07 ENCOUNTER — Encounter: Payer: Self-pay | Admitting: Family

## 2020-04-07 ENCOUNTER — Ambulatory Visit: Payer: Medicare HMO | Attending: Family | Admitting: Family

## 2020-04-07 ENCOUNTER — Other Ambulatory Visit: Payer: Self-pay

## 2020-04-07 VITALS — BP 139/46 | HR 62 | Resp 18 | Ht 67.0 in | Wt 172.0 lb

## 2020-04-07 DIAGNOSIS — K219 Gastro-esophageal reflux disease without esophagitis: Secondary | ICD-10-CM | POA: Diagnosis not present

## 2020-04-07 DIAGNOSIS — Z79899 Other long term (current) drug therapy: Secondary | ICD-10-CM | POA: Insufficient documentation

## 2020-04-07 DIAGNOSIS — E785 Hyperlipidemia, unspecified: Secondary | ICD-10-CM | POA: Insufficient documentation

## 2020-04-07 DIAGNOSIS — N189 Chronic kidney disease, unspecified: Secondary | ICD-10-CM | POA: Insufficient documentation

## 2020-04-07 DIAGNOSIS — Z9861 Coronary angioplasty status: Secondary | ICD-10-CM | POA: Diagnosis not present

## 2020-04-07 DIAGNOSIS — E1122 Type 2 diabetes mellitus with diabetic chronic kidney disease: Secondary | ICD-10-CM | POA: Insufficient documentation

## 2020-04-07 DIAGNOSIS — I130 Hypertensive heart and chronic kidney disease with heart failure and stage 1 through stage 4 chronic kidney disease, or unspecified chronic kidney disease: Secondary | ICD-10-CM | POA: Insufficient documentation

## 2020-04-07 DIAGNOSIS — Z888 Allergy status to other drugs, medicaments and biological substances status: Secondary | ICD-10-CM | POA: Diagnosis not present

## 2020-04-07 DIAGNOSIS — I5032 Chronic diastolic (congestive) heart failure: Secondary | ICD-10-CM | POA: Diagnosis not present

## 2020-04-07 DIAGNOSIS — Z87891 Personal history of nicotine dependence: Secondary | ICD-10-CM | POA: Insufficient documentation

## 2020-04-07 DIAGNOSIS — Z7982 Long term (current) use of aspirin: Secondary | ICD-10-CM | POA: Insufficient documentation

## 2020-04-07 DIAGNOSIS — I10 Essential (primary) hypertension: Secondary | ICD-10-CM

## 2020-04-07 DIAGNOSIS — Z794 Long term (current) use of insulin: Secondary | ICD-10-CM | POA: Insufficient documentation

## 2020-04-07 LAB — GLUCOSE, CAPILLARY: Glucose-Capillary: 240 mg/dL — ABNORMAL HIGH (ref 70–99)

## 2020-04-07 NOTE — Patient Instructions (Signed)
Continue weighing daily and call for an overnight weight gain of > 2 pounds or a weekly weight gain of >5 pounds. 

## 2020-04-09 ENCOUNTER — Telehealth (HOSPITAL_COMMUNITY): Payer: Self-pay

## 2020-04-09 NOTE — Telephone Encounter (Signed)
Attempted to contact to set up home visit.  No answer, left message.   [place] [phone]

## 2020-04-14 ENCOUNTER — Encounter: Payer: Self-pay | Admitting: Gastroenterology

## 2020-04-28 ENCOUNTER — Telehealth (HOSPITAL_COMMUNITY): Payer: Self-pay

## 2020-04-28 NOTE — Telephone Encounter (Signed)
Attempted to contact again, no answer left message explaining who I was and could she return my call.  Will continue to attempt to contact.   [place] [phone]

## 2020-05-05 ENCOUNTER — Telehealth (HOSPITAL_COMMUNITY): Payer: Self-pay

## 2020-05-05 NOTE — Telephone Encounter (Signed)
Attempted to contact, no answer left message about the program and my number.   [place] [phone]

## 2020-05-14 ENCOUNTER — Encounter: Payer: Self-pay | Admitting: Gastroenterology

## 2020-05-14 ENCOUNTER — Ambulatory Visit: Payer: Medicare HMO | Admitting: Gastroenterology

## 2020-05-14 ENCOUNTER — Other Ambulatory Visit: Payer: Self-pay

## 2020-05-14 VITALS — BP 136/65 | HR 80 | Temp 98.0°F | Ht 67.0 in | Wt 168.2 lb

## 2020-05-14 DIAGNOSIS — K2980 Duodenitis without bleeding: Secondary | ICD-10-CM | POA: Diagnosis not present

## 2020-05-14 DIAGNOSIS — Z1211 Encounter for screening for malignant neoplasm of colon: Secondary | ICD-10-CM

## 2020-05-14 MED ORDER — PANTOPRAZOLE SODIUM 40 MG PO TBEC: 40.0000 mg | DELAYED_RELEASE_TABLET | Freq: Every day | ORAL | 2 refills | Status: DC

## 2020-05-14 NOTE — Patient Instructions (Signed)
Please take Protonix 1 tablet daily.

## 2020-05-15 ENCOUNTER — Telehealth (HOSPITAL_COMMUNITY): Payer: Self-pay

## 2020-05-15 NOTE — Progress Notes (Signed)
Vonda Antigua, MD [address]  [place]  [place], [place] [address]  Main: [phone]  Fax: [phone]   Primary Care Physician: Elisabeth Cara, NP   Chief Complaint  Patient presents with  . New Patient (Initial Visit)  . Follow-up    Patient is here today for a hospital follow up for duodenum inflammation. Patient denied abdominal pain, nausea, vomiting, diarrhea or constipation.    HPI: Brittney [name] is a 79 y.o. female recently hospitalized with abdominal pain after Alka-Seltzer/NSAID use with CT showing duodenal inflammation and EGD by Dr. Vicente Males, showing mild inflammation in the duodenal bulb with biopsies showing prominent Brunner's glands with overlying reactive foveolar hyperplasia and mild active duodenitis.  Patient has been taking her PPI twice daily and denies any further abdominal pain.  No further NSAID use.  Previous history: Previous EGD in June 2020 with clean-based duodenal bulb ulcer, grade a esophagitis.  CT on this hospitalization showed duodenitis which led to the EGD.   No dysphagia.  No weight loss.  No nausea or vomiting.  Does report loose stools, that started even prior to the hospitalization.  2-3 loose stools a day with no blood.  Last colonoscopy 2008, with two 8 mm polyps removed.  Pathology report not available.  A colonoscopy was planned in 2016 but patient was a no-show.  Current Outpatient Medications  Medication Sig Dispense Refill  . albuterol (VENTOLIN HFA) 108 (90 Base) MCG/ACT inhaler Inhale 2 puffs into the lungs every 6 (six) hours as needed for wheezing.    Marland Kitchen amLODipine (NORVASC) 10 MG tablet Take 10 mg by mouth daily.    Marland Kitchen aspirin EC 81 MG EC tablet Take 1 tablet (81 mg total) by mouth daily. 90 tablet 0  . atorvastatin (LIPITOR) 40 MG tablet Take 40 mg by mouth daily.     Marland Kitchen bismuth subsalicylate (PEPTO BISMOL) 262 MG/15ML suspension Take 30 mLs by mouth every 6 (six) hours as needed.    . cetirizine (ZYRTEC) 10 MG  tablet Take 10 mg by mouth every other day.     . Choline Fenofibrate 45 MG capsule Take 45 mg by mouth daily.     . furosemide (LASIX) 40 MG tablet Take 40 mg by mouth 2 (two) times daily.    Marland Kitchen glipiZIDE (GLUCOTROL XL) 10 MG 24 hr tablet Take 10 mg by mouth 2 (two) times daily.    . hydrALAZINE (APRESOLINE) 50 MG tablet Take 50 mg by mouth 3 (three) times daily.    . isosorbide mononitrate (IMDUR) 30 MG 24 hr tablet Take 30 mg by mouth 3 (three) times daily.     Marland Kitchen LANTUS SOLOSTAR 100 UNIT/ML Solostar Pen Inject 16 Units into the skin at bedtime.    Marland Kitchen losartan (COZAAR) 100 MG tablet Take 100 mg by mouth daily.    . metFORMIN (GLUCOPHAGE) 1000 MG tablet Take 1,000 mg by mouth 2 (two) times daily with a meal.     . metoprolol tartrate (LOPRESSOR) 50 MG tablet Take 50 mg by mouth 2 (two) times daily.    . Multiple Vitamin (MULTIVITAMIN WITH MINERALS) TABS tablet Take 1 tablet by mouth daily.    . nitroGLYCERIN (NITROSTAT) 0.4 MG SL tablet Place 1 tablet (0.4 mg total) under the tongue every 5 (five) minutes as needed for chest pain. 30 tablet 0  . omeprazole (PRILOSEC) 20 MG capsule Take 20 mg by mouth daily.    . pantoprazole (PROTONIX) 40 MG tablet Take 1 tablet (40  mg total) by mouth daily. 30 tablet 2  . saccharomyces boulardii (FLORASTOR) 250 MG capsule Take 250 mg by mouth 2 (two) times daily.    . sitaGLIPtin (JANUVIA) 100 MG tablet Take 100 mg by mouth daily.    Marland Kitchen spironolactone (ALDACTONE) 25 MG tablet Take 12.5 mg by mouth 2 (two) times daily.      No current facility-administered medications for this visit.    Allergies as of 05/14/2020 - Review Complete 05/14/2020  Allergen Reaction Noted  . Accupril [quinapril hcl] Other (See Comments) 08/01/2015    ROS:  General: Negative for anorexia, weight loss, fever, chills, fatigue, weakness. ENT: Negative for hoarseness, difficulty swallowing , nasal congestion. CV: Negative for chest pain, angina, palpitations, dyspnea on exertion,  peripheral edema.  Respiratory: Negative for dyspnea at rest, dyspnea on exertion, cough, sputum, wheezing.  GI: See history of present illness. GU:  Negative for dysuria, hematuria, urinary incontinence, urinary frequency, nocturnal urination.  Endo: Negative for unusual weight change.    Physical Examination:   BP 136/65   Pulse 80   Temp 98 F (36.7 C) (Oral)   Ht 5\' 7"  (1.702 m)   Wt 168 lb 3.2 oz (76.3 kg)   BMI 26.34 kg/m   General: Well-nourished, well-developed in no acute distress.  Eyes: No icterus. Conjunctivae pink. Mouth: Oropharyngeal mucosa moist and pink , no lesions erythema or exudate. Neck: Supple, Trachea midline Abdomen: Bowel sounds are normal, nontender, nondistended, no hepatosplenomegaly or masses, no abdominal bruits or hernia , no rebound or guarding.   Extremities: No lower extremity edema. No clubbing or deformities. Neuro: Alert and oriented x 3.  Grossly intact. Skin: Warm and dry, no jaundice.   Psych: Alert and cooperative, normal mood and affect.   Labs: CMP     Component Value Date/Time   NA 138 04/04/2020 0432   K 4.6 04/04/2020 0432   CL 107 04/04/2020 0432   CO2 22 04/04/2020 0432   GLUCOSE 239 (H) 04/04/2020 0432   BUN 22 04/04/2020 0432   CREATININE 1.65 (H) 04/04/2020 0432   CALCIUM 9.4 04/04/2020 0432   PROT 7.9 03/31/2020 1402   ALBUMIN 4.0 03/31/2020 1402   AST 13 (L) 03/31/2020 1402   ALT 12 03/31/2020 1402   ALKPHOS 39 03/31/2020 1402   BILITOT 0.8 03/31/2020 1402   GFRNONAA 29 (L) 04/04/2020 0432   GFRAA 34 (L) 04/04/2020 0432   Lab Results  Component Value Date   WBC 9.4 04/04/2020   HGB 9.7 (L) 04/04/2020   HCT 29.1 (L) 04/04/2020   MCV 80.4 04/04/2020   PLT 417 (H) 04/04/2020    Imaging Studies: No results found.  Assessment and Plan:   Brittney [name] is a 79 y.o. y/o female with recent admission for abdominal pain due to NSAID use and CT showing duodenitis, with EGD showing mild duodenitis in the  duodenal bulb, and patient asymptomatic at this time with PPI twice daily  After another month of PPI twice daily, decrease to once daily as long as symptoms remain controlled  Due to her recurrent episodes of abdominal pain, repeat CT showing duodenitis more than once, it would be best to repeat her EGD in 2 to 3 months before discontinuing her PPI completely  Patient also did not have her screening colonoscopy when she was scheduled for it in 2016. I have discussed alternative options, risks & benefits,  which include, but are not limited to, bleeding, infection, perforation,respiratory complication & drug reaction.  The patient  agrees with this plan & written consent will be obtained.    Avoid NSAID use such as Ibuprofen, Aleeve, advil, motrin, BC and Goodie powder, Naproxen, Meloxicam and others.    Dr Vonda Antigua

## 2020-05-15 NOTE — Telephone Encounter (Signed)
Have attempted several times and left messages for [place].  Was able to get in touch with daughter and she advised will give her my number to call me.   [place] [phone]

## 2020-05-28 ENCOUNTER — Telehealth (HOSPITAL_COMMUNITY): Payer: Self-pay

## 2020-05-28 NOTE — Telephone Encounter (Signed)
Attempted to contact Brittney [name] and daughter.  Unable to get in touch with either.  Left messages on both phones.   [place] [phone]

## 2020-07-14 ENCOUNTER — Other Ambulatory Visit: Payer: Medicare HMO | Attending: Gastroenterology

## 2020-07-16 ENCOUNTER — Ambulatory Visit: Admit: 2020-07-16 | Payer: Medicare HMO | Admitting: Gastroenterology

## 2020-07-16 SURGERY — ESOPHAGOGASTRODUODENOSCOPY (EGD) WITH PROPOFOL
Anesthesia: General

## 2020-08-14 ENCOUNTER — Other Ambulatory Visit: Payer: Self-pay

## 2020-08-14 ENCOUNTER — Ambulatory Visit: Payer: Medicare HMO | Admitting: Gastroenterology

## 2020-08-14 ENCOUNTER — Encounter: Payer: Self-pay | Admitting: Gastroenterology

## 2020-08-14 VITALS — BP 155/74 | HR 80 | Temp 98.2°F | Ht 67.0 in | Wt 177.2 lb

## 2020-08-14 DIAGNOSIS — K5900 Constipation, unspecified: Secondary | ICD-10-CM | POA: Diagnosis not present

## 2020-08-14 DIAGNOSIS — R1010 Upper abdominal pain, unspecified: Secondary | ICD-10-CM

## 2020-08-14 NOTE — Patient Instructions (Signed)
Please discontinue taking Pantoprazole (Protonix).  Please take Miralax 17 Grams daily.   High-Fiber Diet Fiber, also called dietary fiber, is a type of carbohydrate that is found in fruits, vegetables, whole grains, and beans. A high-fiber diet can have many health benefits. Your health care provider may recommend a high-fiber diet to help:  Prevent constipation. Fiber can make your bowel movements more regular.  Lower your cholesterol.  Relieve the following conditions: ? Swelling of veins in the anus (hemorrhoids). ? Swelling and irritation (inflammation) of specific areas of the digestive tract (uncomplicated diverticulosis). ? A problem of the large intestine (colon) that sometimes causes pain and diarrhea (irritable bowel syndrome, IBS).  Prevent overeating as part of a weight-loss plan.  Prevent heart disease, type 2 diabetes, and certain cancers. What is my plan? The recommended daily fiber intake in grams (g) includes:  38 g for men age 51 or younger.  30 g for men over age 34.  86 g for women age 86 or younger.  21 g for women over age 87. You can get the recommended daily intake of dietary fiber by:  Eating a variety of fruits, vegetables, grains, and beans.  Taking a fiber supplement, if it is not possible to get enough fiber through your diet. What do I need to know about a high-fiber diet?  It is better to get fiber through food sources rather than from fiber supplements. There is not a lot of research about how effective supplements are.  Always check the fiber content on the nutrition facts label of any prepackaged food. Look for foods that contain 5 g of fiber or more per serving.  Talk with a diet and nutrition specialist (dietitian) if you have questions about specific foods that are recommended or not recommended for your medical condition, especially if those foods are not listed below.  Gradually increase how much fiber you consume. If you increase  your intake of dietary fiber too quickly, you may have bloating, cramping, or gas.  Drink plenty of water. Water helps you to digest fiber. What are tips for following this plan?  Eat a wide variety of high-fiber foods.  Make sure that half of the grains that you eat each day are whole grains.  Eat breads and cereals that are made with whole-grain flour instead of refined flour or white flour.  Eat brown rice, bulgur wheat, or millet instead of white rice.  Start the day with a breakfast that is high in fiber, such as a cereal that contains 5 g of fiber or more per serving.  Use beans in place of meat in soups, salads, and pasta dishes.  Eat high-fiber snacks, such as berries, raw vegetables, nuts, and popcorn.  Choose whole fruits and vegetables instead of processed forms like juice or sauce. What foods can I eat?  Fruits Berries. Pears. Apples. Oranges. Avocado. Prunes and raisins. Dried figs. Vegetables Sweet potatoes. Spinach. Kale. Artichokes. Cabbage. Broccoli. Cauliflower. Green peas. Carrots. Squash. Grains Whole-grain breads. Multigrain cereal. Oats and oatmeal. Brown rice. Barley. Bulgur wheat. [employer]. Quinoa. Bran muffins. Popcorn. Rye wafer crackers. Meats and other proteins Navy, kidney, and pinto beans. Soybeans. Split peas. Lentils. Nuts and seeds. Dairy Fiber-fortified yogurt. Beverages Fiber-fortified soy milk. Fiber-fortified orange juice. Other foods Fiber bars. The items listed above may not be a complete list of recommended foods and beverages. Contact a dietitian for more options. What foods are not recommended? Fruits Fruit juice. Cooked, strained fruit. Vegetables Fried potatoes. Canned vegetables. Well-cooked  vegetables. Grains White bread. Pasta made with refined flour. White rice. Meats and other proteins Fatty cuts of meat. Fried chicken or fried fish. Dairy Milk. Yogurt. Cream cheese. Sour cream. Fats and oils Butters. Beverages Soft  drinks. Other foods Cakes and pastries. The items listed above may not be a complete list of foods and beverages to avoid. Contact a dietitian for more information. Summary  Fiber is a type of carbohydrate. It is found in fruits, vegetables, whole grains, and beans.  There are many health benefits of eating a high-fiber diet, such as preventing constipation, lowering blood cholesterol, helping with weight loss, and reducing your risk of heart disease, diabetes, and certain cancers.  Gradually increase your intake of fiber. Increasing too fast can result in cramping, bloating, and gas. Drink plenty of water while you increase your fiber.  The best sources of fiber include whole fruits and vegetables, whole grains, nuts, seeds, and beans. This information is not intended to replace advice given to you by your health care provider. Make sure you discuss any questions you have with your health care provider. Document Revised: 10/17/2017 Document Reviewed: 10/17/2017 Elsevier Patient Education  2020 [employer].

## 2020-08-14 NOTE — Progress Notes (Signed)
Vonda Antigua, MD [address]  [place], [place] [address]  Main: [phone]  Fax: [phone]   Primary Care Physician: Elisabeth Cara, NP   Chief Complaint  Patient presents with  . Duodenitis    HPI: Brittney [name] is a 79 y.o. female here for follow-up of abdominal pain.  Abdominal pain has completely resolved with PPI.  Patient is now reporting constipation.  Has a bowel movement daily but has to strain at times.  No blood in stool.  Not taking anything for her bowels.  States he has been eating fruits and vegetables.  No weight loss.    Previous CT scan report and EGD report reviewed  Previous history: Abdominal pain after Alka-Seltzer/NSAID use with CT showing duodenal inflammation and EGD by Dr. Vicente Males, showing mild inflammation in the duodenal bulb with biopsies showing prominent Brunner's glands with overlying reactive foveolar hyperplasia and mild active duodenitis. No further NSAID use.  Previous EGD in June 2020 with clean-based duodenal bulb ulcer, grade a esophagitis.  CT on this hospitalization showed duodenitis which led to the EGD.    Last colonoscopy 2008, with two 8 mm polyps removed.  Pathology report not available.  A colonoscopy was planned in 2016 but patient was a no-show.  Current Outpatient Medications  Medication Sig Dispense Refill  . albuterol (VENTOLIN HFA) 108 (90 Base) MCG/ACT inhaler Inhale 2 puffs into the lungs every 6 (six) hours as needed for wheezing.    Marland Kitchen amLODipine (NORVASC) 10 MG tablet Take 10 mg by mouth daily.    Marland Kitchen aspirin EC 81 MG EC tablet Take 1 tablet (81 mg total) by mouth daily. 90 tablet 0  . atorvastatin (LIPITOR) 40 MG tablet Take 40 mg by mouth daily.     . cetirizine (ZYRTEC) 10 MG tablet Take 10 mg by mouth every other day.     . Choline Fenofibrate 45 MG capsule Take 45 mg by mouth daily.     . furosemide (LASIX) 40 MG tablet Take 40 mg by mouth 2 (two) times daily.    Marland Kitchen glipiZIDE (GLUCOTROL XL)  10 MG 24 hr tablet Take 10 mg by mouth 2 (two) times daily.    . hydrALAZINE (APRESOLINE) 50 MG tablet Take 50 mg by mouth 3 (three) times daily.    . isosorbide mononitrate (IMDUR) 30 MG 24 hr tablet Take 30 mg by mouth 3 (three) times daily.     Marland Kitchen LANTUS SOLOSTAR 100 UNIT/ML Solostar Pen Inject 16 Units into the skin at bedtime.    . metFORMIN (GLUCOPHAGE) 1000 MG tablet Take 1,000 mg by mouth 2 (two) times daily with a meal.     . metoprolol tartrate (LOPRESSOR) 50 MG tablet Take 50 mg by mouth 2 (two) times daily.    . Multiple Vitamin (MULTIVITAMIN WITH MINERALS) TABS tablet Take 1 tablet by mouth daily.    . nitroGLYCERIN (NITROSTAT) 0.4 MG SL tablet Place 1 tablet (0.4 mg total) under the tongue every 5 (five) minutes as needed for chest pain. 30 tablet 0  . sitaGLIPtin (JANUVIA) 100 MG tablet Take 100 mg by mouth daily.    Marland Kitchen umeclidinium-vilanterol (ANORO ELLIPTA) 62.5-25 MCG/INH AEPB Inhale 1 puff into the lungs 1 day or 1 dose.     No current facility-administered medications for this visit.    Allergies as of 08/14/2020 - Review Complete 08/14/2020  Allergen Reaction Noted  . Accupril [quinapril hcl] Other (See Comments) 08/01/2015    ROS:  General:  Negative for anorexia, weight loss, fever, chills, fatigue, weakness. ENT: Negative for hoarseness, difficulty swallowing , nasal congestion. CV: Negative for chest pain, angina, palpitations, dyspnea on exertion, peripheral edema.  Respiratory: Negative for dyspnea at rest, dyspnea on exertion, cough, sputum, wheezing.  GI: See history of present illness. GU:  Negative for dysuria, hematuria, urinary incontinence, urinary frequency, nocturnal urination.  Endo: Negative for unusual weight change.    Physical Examination:   BP (!) 155/74   Pulse 80   Temp 98.2 F (36.8 C) (Oral)   Ht 5\' 7"  (1.702 m)   Wt 177 lb 3.2 oz (80.4 kg)   BMI 27.75 kg/m   General: Well-nourished, well-developed in no acute distress.  Eyes: No  icterus. Conjunctivae pink. Mouth: Oropharyngeal mucosa moist and pink , no lesions erythema or exudate. Neck: Supple, Trachea midline Abdomen: Bowel sounds are normal, nontender, nondistended, no hepatosplenomegaly or masses, no abdominal bruits or hernia , no rebound or guarding.   Extremities: No lower extremity edema. No clubbing or deformities. Neuro: Alert and oriented x 3.  Grossly intact. Skin: Warm and dry, no jaundice.   Psych: Alert and cooperative, normal mood and affect.   Labs: CMP     Component Value Date/Time   NA 138 04/04/2020 0432   K 4.6 04/04/2020 0432   CL 107 04/04/2020 0432   CO2 22 04/04/2020 0432   GLUCOSE 239 (H) 04/04/2020 0432   BUN 22 04/04/2020 0432   CREATININE 1.65 (H) 04/04/2020 0432   CALCIUM 9.4 04/04/2020 0432   PROT 7.9 03/31/2020 1402   ALBUMIN 4.0 03/31/2020 1402   AST 13 (L) 03/31/2020 1402   ALT 12 03/31/2020 1402   ALKPHOS 39 03/31/2020 1402   BILITOT 0.8 03/31/2020 1402   GFRNONAA 29 (L) 04/04/2020 0432   GFRAA 34 (L) 04/04/2020 0432   Lab Results  Component Value Date   WBC 9.4 04/04/2020   HGB 9.7 (L) 04/04/2020   HCT 29.1 (L) 04/04/2020   MCV 80.4 04/04/2020   PLT 417 (H) 04/04/2020    Imaging Studies: No results found.  Assessment and Plan:   Brittney [name] is a 79 y.o. y/o female with a history of duodenitis due to NSAID use here for follow-up and reports constipation  Abdominal pain has completely resolved Discontinue PPI Due to previous abnormal CT repeat EGD was discussed prior to discontinuing PPI.  patient is not interested in this at this time.  If pain reoccurs she will call us to schedule this then  High-fiber diet MiraLAX daily with goal of 1-2 soft bowel movements daily.  If not at goal, patient instructed to increase dose to twice daily.  If loose stools with the medication, patient asked to decrease the medication to every other day, or half dose daily.  Patient verbalized understanding  Since patient  missed her screening colonoscopy in 2016, screening colonoscopy at this time discussed including risks and benefits of procedures and benefits of ruling out any colon malignancy and removing any polyps.  Patient not interested in this at this time.   Dr Vonda Antigua

## 2020-09-20 ENCOUNTER — Observation Stay
Admission: EM | Admit: 2020-09-20 | Discharge: 2020-09-21 | Disposition: A | Discharge status: Home / Self Care | Payer: Medicare HMO | Attending: Internal Medicine | Admitting: Internal Medicine

## 2020-09-20 ENCOUNTER — Emergency Department: Payer: Medicare HMO

## 2020-09-20 ENCOUNTER — Other Ambulatory Visit: Payer: Self-pay

## 2020-09-20 DIAGNOSIS — E876 Hypokalemia: Secondary | ICD-10-CM | POA: Diagnosis present

## 2020-09-20 DIAGNOSIS — I130 Hypertensive heart and chronic kidney disease with heart failure and stage 1 through stage 4 chronic kidney disease, or unspecified chronic kidney disease: Secondary | ICD-10-CM | POA: Diagnosis not present

## 2020-09-20 DIAGNOSIS — N179 Acute kidney failure, unspecified: Secondary | ICD-10-CM

## 2020-09-20 DIAGNOSIS — Z79899 Other long term (current) drug therapy: Secondary | ICD-10-CM | POA: Insufficient documentation

## 2020-09-20 DIAGNOSIS — Z7982 Long term (current) use of aspirin: Secondary | ICD-10-CM | POA: Diagnosis not present

## 2020-09-20 DIAGNOSIS — N1831 Chronic kidney disease, stage 3a: Secondary | ICD-10-CM | POA: Insufficient documentation

## 2020-09-20 DIAGNOSIS — E1129 Type 2 diabetes mellitus with other diabetic kidney complication: Secondary | ICD-10-CM | POA: Diagnosis present

## 2020-09-20 DIAGNOSIS — I10 Essential (primary) hypertension: Secondary | ICD-10-CM | POA: Diagnosis not present

## 2020-09-20 DIAGNOSIS — N1832 Chronic kidney disease, stage 3b: Secondary | ICD-10-CM

## 2020-09-20 DIAGNOSIS — R079 Chest pain, unspecified: Principal | ICD-10-CM | POA: Insufficient documentation

## 2020-09-20 DIAGNOSIS — Z87891 Personal history of nicotine dependence: Secondary | ICD-10-CM | POA: Insufficient documentation

## 2020-09-20 DIAGNOSIS — N189 Chronic kidney disease, unspecified: Secondary | ICD-10-CM | POA: Diagnosis present

## 2020-09-20 DIAGNOSIS — Z20822 Contact with and (suspected) exposure to covid-19: Secondary | ICD-10-CM | POA: Insufficient documentation

## 2020-09-20 DIAGNOSIS — R112 Nausea with vomiting, unspecified: Secondary | ICD-10-CM

## 2020-09-20 DIAGNOSIS — R0602 Shortness of breath: Secondary | ICD-10-CM | POA: Diagnosis present

## 2020-09-20 DIAGNOSIS — I2510 Atherosclerotic heart disease of native coronary artery without angina pectoris: Secondary | ICD-10-CM | POA: Insufficient documentation

## 2020-09-20 DIAGNOSIS — D72829 Elevated white blood cell count, unspecified: Secondary | ICD-10-CM | POA: Insufficient documentation

## 2020-09-20 DIAGNOSIS — E119 Type 2 diabetes mellitus without complications: Secondary | ICD-10-CM | POA: Insufficient documentation

## 2020-09-20 DIAGNOSIS — R748 Abnormal levels of other serum enzymes: Secondary | ICD-10-CM | POA: Diagnosis not present

## 2020-09-20 DIAGNOSIS — I5022 Chronic systolic (congestive) heart failure: Secondary | ICD-10-CM | POA: Diagnosis not present

## 2020-09-20 DIAGNOSIS — R778 Other specified abnormalities of plasma proteins: Secondary | ICD-10-CM

## 2020-09-20 DIAGNOSIS — Z7984 Long term (current) use of oral hypoglycemic drugs: Secondary | ICD-10-CM | POA: Diagnosis not present

## 2020-09-20 DIAGNOSIS — R0609 Other forms of dyspnea: Secondary | ICD-10-CM

## 2020-09-20 DIAGNOSIS — E785 Hyperlipidemia, unspecified: Secondary | ICD-10-CM

## 2020-09-20 LAB — HEMOGLOBIN A1C
Hgb A1c MFr Bld: 10.5 % — ABNORMAL HIGH (ref 4.8–5.6)
Mean Plasma Glucose: 254.65 mg/dL

## 2020-09-20 LAB — BASIC METABOLIC PANEL
Anion gap: 16 — ABNORMAL HIGH (ref 5–15)
BUN: 23 mg/dL (ref 8–23)
CO2: 23 mmol/L (ref 22–32)
Calcium: 9 mg/dL (ref 8.9–10.3)
Chloride: 100 mmol/L (ref 98–111)
Creatinine, Ser: 1.94 mg/dL — ABNORMAL HIGH (ref 0.44–1.00)
GFR calc Af Amer: 28 mL/min — ABNORMAL LOW (ref >60)
GFR calc non Af Amer: 24 mL/min — ABNORMAL LOW (ref >60)
Glucose, Bld: 313 mg/dL — ABNORMAL HIGH (ref 70–99)
Potassium: 3.2 mmol/L — ABNORMAL LOW (ref 3.5–5.1)
Sodium: 139 mmol/L (ref 135–145)

## 2020-09-20 LAB — RESPIRATORY PANEL BY RT PCR (FLU A&B, COVID)
Influenza A by PCR: NEGATIVE
Influenza B by PCR: NEGATIVE
SARS Coronavirus 2 by RT PCR: NEGATIVE

## 2020-09-20 LAB — TROPONIN I (HIGH SENSITIVITY)
Troponin I (High Sensitivity): 21 ng/L — ABNORMAL HIGH (ref <18)
Troponin I (High Sensitivity): 108 ng/L (ref <18)
Troponin I (High Sensitivity): 574 ng/L (ref <18)
Troponin I (High Sensitivity): 600 ng/L (ref <18)

## 2020-09-20 LAB — GLUCOSE, CAPILLARY
Glucose-Capillary: 317 mg/dL — ABNORMAL HIGH (ref 70–99)
Glucose-Capillary: 156 mg/dL — ABNORMAL HIGH (ref 70–99)
Glucose-Capillary: 142 mg/dL — ABNORMAL HIGH (ref 70–99)
Glucose-Capillary: 268 mg/dL — ABNORMAL HIGH (ref 70–99)

## 2020-09-20 LAB — CBC
HCT: 32 % — ABNORMAL LOW (ref 36.0–46.0)
Hemoglobin: 10.1 g/dL — ABNORMAL LOW (ref 12.0–15.0)
MCH: 25.4 pg — ABNORMAL LOW (ref 26.0–34.0)
MCHC: 31.6 g/dL (ref 30.0–36.0)
MCV: 80.6 fL (ref 80.0–100.0)
Platelets: 498 10*3/uL — ABNORMAL HIGH (ref 150–400)
RBC: 3.97 MIL/uL (ref 3.87–5.11)
RDW: 14.4 % (ref 11.5–15.5)
WBC: 13 10*3/uL — ABNORMAL HIGH (ref 4.0–10.5)
nRBC: 0 % (ref 0.0–0.2)

## 2020-09-20 LAB — PROTIME-INR
INR: 0.9 (ref 0.8–1.2)
Prothrombin Time: 11.4 seconds (ref 11.4–15.2)

## 2020-09-20 LAB — APTT: aPTT: 30 seconds (ref 24–36)

## 2020-09-20 LAB — BRAIN NATRIURETIC PEPTIDE: B Natriuretic Peptide: 147.8 pg/mL — ABNORMAL HIGH (ref 0.0–100.0)

## 2020-09-20 MED ORDER — INSULIN ASPART 100 UNIT/ML ~~LOC~~ SOLN
0.0000 [IU] | Freq: Three times a day (TID) | SUBCUTANEOUS | Status: DC
  Administered 2020-09-20: 2 [IU] via SUBCUTANEOUS
  Administered 2020-09-20: 3 [IU] via SUBCUTANEOUS
  Administered 2020-09-21: 8 [IU] via SUBCUTANEOUS
  Administered 2020-09-21: 15 [IU] via SUBCUTANEOUS
  Filled 2020-09-20 (×4): qty 1

## 2020-09-20 MED ORDER — ISOSORBIDE MONONITRATE ER 60 MG PO TB24: 30.0000 mg | ORAL_TABLET | Freq: Three times a day (TID) | ORAL | Status: DC

## 2020-09-20 MED ORDER — ROSUVASTATIN CALCIUM 20 MG PO TABS
40.0000 mg | ORAL_TABLET | Freq: Every day | ORAL | Status: DC
  Administered 2020-09-20 – 2020-09-21 (×2): 40 mg via ORAL
  Filled 2020-09-20 (×2): qty 2

## 2020-09-20 MED ORDER — CLOPIDOGREL BISULFATE 75 MG PO TABS
75.0000 mg | ORAL_TABLET | Freq: Every day | ORAL | Status: DC
  Administered 2020-09-20 – 2020-09-21 (×2): 75 mg via ORAL
  Filled 2020-09-20 (×2): qty 1

## 2020-09-20 MED ORDER — ASPIRIN EC 81 MG PO TBEC
81.0000 mg | DELAYED_RELEASE_TABLET | Freq: Every day | ORAL | Status: DC
  Administered 2020-09-20 – 2020-09-21 (×2): 81 mg via ORAL
  Filled 2020-09-20 (×2): qty 1

## 2020-09-20 MED ORDER — METOPROLOL SUCCINATE ER 50 MG PO TB24
100.0000 mg | ORAL_TABLET | Freq: Every day | ORAL | Status: DC
  Administered 2020-09-20 – 2020-09-21 (×2): 100 mg via ORAL
  Filled 2020-09-20 (×2): qty 2

## 2020-09-20 MED ORDER — POTASSIUM CHLORIDE CRYS ER 20 MEQ PO TBCR
40.0000 meq | EXTENDED_RELEASE_TABLET | Freq: Once | ORAL | Status: AC
  Administered 2020-09-20: 40 meq via ORAL
  Filled 2020-09-20: qty 2

## 2020-09-20 MED ORDER — IPRATROPIUM-ALBUTEROL 0.5-2.5 (3) MG/3ML IN SOLN
3.0000 mL | Freq: Four times a day (QID) | RESPIRATORY_TRACT | Status: DC
  Administered 2020-09-20 – 2020-09-21 (×3): 3 mL via RESPIRATORY_TRACT
  Filled 2020-09-20 (×3): qty 3

## 2020-09-20 MED ORDER — INSULIN ASPART 100 UNIT/ML ~~LOC~~ SOLN
0.0000 [IU] | Freq: Every day | SUBCUTANEOUS | Status: DC
  Administered 2020-09-20: 3 [IU] via SUBCUTANEOUS
  Filled 2020-09-20: qty 1

## 2020-09-20 MED ORDER — HEPARIN BOLUS VIA INFUSION
2000.0000 [IU] | Freq: Once | INTRAVENOUS | Status: AC
  Administered 2020-09-20: 2000 [IU] via INTRAVENOUS
  Filled 2020-09-20: qty 2000

## 2020-09-20 MED ORDER — METOPROLOL TARTRATE 50 MG PO TABS: 50.0000 mg | ORAL_TABLET | Freq: Two times a day (BID) | ORAL | Status: DC

## 2020-09-20 MED ORDER — ONDANSETRON HCL 4 MG/2ML IJ SOLN: 4.0000 mg | Freq: Three times a day (TID) | INTRAMUSCULAR | Status: DC | PRN

## 2020-09-20 MED ORDER — ADULT MULTIVITAMIN W/MINERALS CH
1.0000 | ORAL_TABLET | Freq: Every day | ORAL | Status: DC
  Administered 2020-09-20 – 2020-09-21 (×2): 1 via ORAL
  Filled 2020-09-20 (×2): qty 1

## 2020-09-20 MED ORDER — NITROGLYCERIN 2 % TD OINT
1.0000 [in_us] | TOPICAL_OINTMENT | Freq: Three times a day (TID) | TRANSDERMAL | Status: DC
  Administered 2020-09-20 – 2020-09-21 (×2): 1 [in_us] via TOPICAL
  Filled 2020-09-20: qty 1

## 2020-09-20 MED ORDER — NITROGLYCERIN 0.4 MG SL SUBL: 0.4000 mg | SUBLINGUAL_TABLET | SUBLINGUAL | Status: DC | PRN

## 2020-09-20 MED ORDER — MORPHINE SULFATE (PF) 2 MG/ML IV SOLN: 0.5000 mg | INTRAVENOUS | Status: DC | PRN

## 2020-09-20 MED ORDER — AMLODIPINE BESYLATE 5 MG PO TABS
10.0000 mg | ORAL_TABLET | Freq: Every day | ORAL | Status: DC
  Administered 2020-09-20 – 2020-09-21 (×2): 10 mg via ORAL
  Filled 2020-09-20 (×2): qty 2

## 2020-09-20 MED ORDER — LORATADINE 10 MG PO TABS
10.0000 mg | ORAL_TABLET | Freq: Every day | ORAL | Status: DC
  Administered 2020-09-20 – 2020-09-21 (×2): 10 mg via ORAL
  Filled 2020-09-20 (×2): qty 1

## 2020-09-20 MED ORDER — HYDRALAZINE HCL 50 MG PO TABS
25.0000 mg | ORAL_TABLET | Freq: Two times a day (BID) | ORAL | Status: DC
  Administered 2020-09-20 – 2020-09-21 (×2): 25 mg via ORAL
  Filled 2020-09-20 (×2): qty 1

## 2020-09-20 MED ORDER — HEPARIN (PORCINE) 25000 UT/250ML-% IV SOLN
1050.0000 [IU]/h | INTRAVENOUS | Status: DC
  Administered 2020-09-20: 900 [IU]/h via INTRAVENOUS
  Filled 2020-09-20: qty 250

## 2020-09-20 MED ORDER — ISOSORBIDE MONONITRATE ER 60 MG PO TB24
60.0000 mg | ORAL_TABLET | Freq: Every day | ORAL | Status: DC
  Administered 2020-09-20 – 2020-09-21 (×2): 60 mg via ORAL
  Filled 2020-09-20 (×2): qty 1

## 2020-09-20 MED ORDER — TICAGRELOR 90 MG PO TABS: 180.0000 mg | ORAL_TABLET | Freq: Once | ORAL | Status: DC

## 2020-09-20 MED ORDER — PANTOPRAZOLE SODIUM 40 MG PO TBEC
40.0000 mg | DELAYED_RELEASE_TABLET | Freq: Every day | ORAL | Status: DC
  Administered 2020-09-20 – 2020-09-21 (×2): 40 mg via ORAL
  Filled 2020-09-20 (×2): qty 1

## 2020-09-20 MED ORDER — INSULIN GLARGINE 100 UNIT/ML ~~LOC~~ SOLN
8.0000 [IU] | Freq: Every day | SUBCUTANEOUS | Status: DC
  Administered 2020-09-20: 8 [IU] via SUBCUTANEOUS
  Filled 2020-09-20 (×2): qty 0.08

## 2020-09-20 MED ORDER — FENOFIBRATE 54 MG PO TABS
54.0000 mg | ORAL_TABLET | Freq: Every day | ORAL | Status: DC
  Administered 2020-09-20: 54 mg via ORAL
  Filled 2020-09-20 (×2): qty 1

## 2020-09-20 MED ORDER — ACETAMINOPHEN 325 MG PO TABS: 650.0000 mg | ORAL_TABLET | Freq: Four times a day (QID) | ORAL | Status: DC | PRN

## 2020-09-20 MED ORDER — ATORVASTATIN CALCIUM 20 MG PO TABS
40.0000 mg | ORAL_TABLET | Freq: Every day | ORAL | Status: DC
  Administered 2020-09-20: 40 mg via ORAL
  Filled 2020-09-20: qty 2

## 2020-09-20 MED ORDER — SACCHAROMYCES BOULARDII 250 MG PO CAPS
250.0000 mg | ORAL_CAPSULE | Freq: Two times a day (BID) | ORAL | Status: DC
  Administered 2020-09-20 – 2020-09-21 (×2): 250 mg via ORAL
  Filled 2020-09-20 (×3): qty 1

## 2020-09-20 MED ORDER — HYDRALAZINE HCL 20 MG/ML IJ SOLN: 5.0000 mg | INTRAMUSCULAR | Status: DC | PRN

## 2020-09-20 MED ORDER — ALBUTEROL SULFATE (2.5 MG/3ML) 0.083% IN NEBU: 2.5000 mg | INHALATION_SOLUTION | RESPIRATORY_TRACT | Status: DC | PRN

## 2020-09-20 MED ORDER — HEPARIN SODIUM (PORCINE) 5000 UNIT/ML IJ SOLN
5000.0000 [IU] | Freq: Three times a day (TID) | INTRAMUSCULAR | Status: DC
  Administered 2020-09-20: 5000 [IU] via SUBCUTANEOUS
  Filled 2020-09-20: qty 1

## 2020-09-20 MED ORDER — DM-GUAIFENESIN ER 30-600 MG PO TB12: 1.0000 | ORAL_TABLET | Freq: Two times a day (BID) | ORAL | Status: DC | PRN

## 2020-09-20 NOTE — Consult Note (Signed)
Brittney Brittney [name] is Brittney [name] 79 y.o. female  [id number]  Primary Cardiologist: Dr. Curt Bears Reason for Consultation: Shortness of breath  HPI: Is Brittney [name] 79 year old female presented to the hospital with shortness of breath but no chest pain.  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  Patient states she gradually got short of breath associated with some tightness in the chest.  She says she had Brittney [name] cardiac catheterization done in January this year by Dr. Towanda Malkin and had three-vessel coronary artery disease which was going to be treated medically and if it fails then will have complex intervention or CABG.   Review of Systems: No chest pain   Past Medical History:  Diagnosis Date   Aortic valve stenosis    Carotid bruit    CHF (congestive heart failure) (HCC)    Chronic kidney disease    Diabetes mellitus without complication (HCC)    GERD (gastroesophageal reflux disease)    Heart murmur    Hyperlipidemia    Hypertension    Pneumonia    Vitamin D deficiency     (Not in Brittney [name] hospital admission)     heparin  5,000 Units Subcutaneous Q8H   insulin aspart  0-15 Units Subcutaneous TID WC   insulin aspart  0-5 Units Subcutaneous QHS   ipratropium-albuterol  3 mL Nebulization Q6H    Infusions:   Allergies  Allergen Reactions   Accupril [Quinapril Hcl] Other (See Comments)    Reaction: unknown    Social History   Socioeconomic History   Marital status: Widowed    Spouse name: Not on file   Number of children: Not on file   Years of education: Not on file   Highest education level: Not on file  Occupational History   Not on file  Tobacco Use   Smoking status: Former Smoker    Packs/day: 1.00    Years: 40.00    Pack years: 40.00    Types: Cigarettes    Quit date: 07/27/2018    Years since quitting: 2.1   Smokeless tobacco: Never Used  Vaping Use   Vaping Use: Never used  Substance and Sexual  Activity   Alcohol use: No   Drug use: No   Sexual activity: Not on file  Other Topics Concern   Not on file  Social History Narrative   Not on file   Social Determinants of Health   Financial Resource Strain:    Difficulty of Paying Living Expenses: Not on file  Food Insecurity:    Worried About Charity fundraiser in the Last Year: Not on file   [employer] of Food in the Last Year: Not on file  Transportation Needs:    Lack of Transportation (Medical): Not on file   Lack of Transportation (Non-Medical): Not on file  Physical Activity:    Days of Exercise per Week: Not on file   Minutes of Exercise per Session: Not on file  Stress:    Feeling of Stress : Not on file  Social Connections:    Frequency of Communication with Friends and Family: Not on file   Frequency of Social Gatherings with Friends and Family: Not on file   Attends Religious Services: Not on file   Active Member of Clubs or Organizations: Not on file   Attends Archivist Meetings: Not on file   Marital Status: Not on file  Intimate Partner Violence:    Fear of Current or Ex-Partner: Not on  file   Emotionally Abused: Not on file   Physically Abused: Not on file   Sexually Abused: Not on file    Family History  Problem Relation Age of Onset   Diabetes Neg Hx    Hypertension Neg Hx    Breast cancer Neg Hx     PHYSICAL EXAM: Vitals:   09/20/20 0635 09/20/20 0855  BP: (!) 165/61 (!) 161/64  Pulse: 71 67  Resp: 16 16  Temp: 98.6 F (37 C) 98.2 F (36.8 C)  SpO2: 97% 98%    No intake or output data in the 24 hours ending 09/20/20 1404  General:  Well appearing. No respiratory difficulty HEENT: normal Neck: supple. no JVD. Carotids 2+ bilat; no bruits. No lymphadenopathy or thryomegaly appreciated. Cor: PMI nondisplaced. Regular rate & rhythm. No rubs, gallops or murmurs. Lungs: clear Abdomen: soft, nontender, nondistended. No hepatosplenomegaly. No bruits or  masses. Good bowel sounds. Extremities: no cyanosis, clubbing, rash, edema Neuro: alert & oriented x 3, cranial nerves grossly intact. moves all 4 extremities w/o difficulty. Affect pleasant.  ECG: Normal sinus rhythm no acute changes  Results for orders placed or performed during the hospital encounter of 09/20/20 (from the past 24 hour(s))  Basic metabolic panel     Status: Abnormal   Collection Time: 09/20/20  4:03 AM  Result Value Ref Range   Sodium 139 135 - 145 mmol/L   Potassium 3.2 (L) 3.5 - 5.1 mmol/L   Chloride 100 98 - 111 mmol/L   CO2 23 22 - 32 mmol/L   Glucose, Bld 313 (H) 70 - 99 mg/dL   BUN 23 8 - 23 mg/dL   Creatinine, Ser 1.94 (H) 0.44 - 1.00 mg/dL   Calcium 9.0 8.9 - 10.3 mg/dL   GFR calc non Af Amer 24 (L) >60 mL/min   GFR calc Af Amer 28 (L) >60 mL/min   Anion gap 16 (H) 5 - 15  CBC     Status: Abnormal   Collection Time: 09/20/20  4:03 AM  Result Value Ref Range   WBC 13.0 (H) 4.0 - 10.5 K/uL   RBC 3.97 3.87 - 5.11 MIL/uL   Hemoglobin 10.1 (L) 12.0 - 15.0 g/dL   HCT 32.0 (L) 36 - 46 %   MCV 80.6 80.0 - 100.0 fL   MCH 25.4 (L) 26.0 - 34.0 pg   MCHC 31.6 30.0 - 36.0 g/dL   RDW 14.4 11.5 - 15.5 %   Platelets 498 (H) 150 - 400 K/uL   nRBC 0.0 0.0 - 0.2 %  Troponin I (High Sensitivity)     Status: Abnormal   Collection Time: 09/20/20  4:03 AM  Result Value Ref Range   Troponin I (High Sensitivity) 21 (H) <18 ng/L  Brain natriuretic peptide     Status: Abnormal   Collection Time: 09/20/20  4:03 AM  Result Value Ref Range   B Natriuretic Peptide 147.8 (H) 0.0 - 100.0 pg/mL  Troponin I (High Sensitivity)     Status: Abnormal   Collection Time: 09/20/20  6:36 AM  Result Value Ref Range   Troponin I (High Sensitivity) 108 (HH) <18 ng/L  Glucose, capillary     Status: Abnormal   Collection Time: 09/20/20  6:39 AM  Result Value Ref Range   Glucose-Capillary 317 (H) 70 - 99 mg/dL  Troponin I (High Sensitivity)     Status: Abnormal   Collection Time:  09/20/20 12:38 PM  Result Value Ref Range   Troponin I (High Sensitivity)  574 (HH) <18 ng/L  Glucose, capillary     Status: Abnormal   Collection Time: 09/20/20 12:40 PM  Result Value Ref Range   Glucose-Capillary 156 (H) 70 - 99 mg/dL   DG Chest 2 View  Result Date: 09/20/2020 CLINICAL DATA:  Chest pain and shortness of breath. EXAM: CHEST - 2 VIEW COMPARISON:  03/18/2020 and prior chest radiographs FINDINGS: The cardiomediastinal silhouette is unremarkable. Mild interstitial prominence is unchanged. Aortic atherosclerotic calcifications are again noted. There is no evidence of focal airspace disease, pulmonary edema, suspicious pulmonary nodule/mass, pleural effusion, or pneumothorax. No acute bony abnormalities are identified. IMPRESSION: No active cardiopulmonary disease. Aortic Atherosclerosis (ICD10-I70.0). Electronically Signed   By: Margarette Canada M.D.   On: 09/20/2020 05:42     ASSESSMENT AND PLAN: HFpEF with three-vessel coronary artery disease and renal insufficiency.  Will adjust her medication and consider catheterization although creatinine is 1.94 if she does not get better.  She was not evaluated at [facility] according to her before CABG and has been medically treated.  She is currently not having any chest pain.  Brittney Brittney [name]

## 2020-09-20 NOTE — H&P (Addendum)
History and Physical    Brittney [name] NTZ:[id number] DOB: [birth year] DOA: 09/20/2020  Referring MD/NP/PA:   PCP: Elisabeth Cara, NP   Patient coming from:  The patient is coming from home.  At baseline, pt is independent for most of ADL.        Chief Complaint: chest pain SOB  HPI: Brittney [name] is a 79 y.o. female with medical history significant of HTN, HLD, DM, CAD, dCHF, AS, CKD-IIIb, GERD, who presents with chest pain or shortness of breath.  Per her daughter (I called her daughter by phone), ptient started having chest pain and shortness of breath this morning. She has dry cough, no fever or chills.  Patient has chest pain, which is located in substernal area, initially mild to moderate, currently chest pain is minimal, dull, nonradiating. Patient has nausea and vomited once last night, currently patient does not have nausea, vomiting, diarrhea or abdominal pain.  No symptoms of UTI.  No unilateral numbness or tingling in her extremities with no facial droop or slurred speech.   Cardiac cath by Dr. Clayborn Bigness on 01/16/2019  Mid RCA lesion is 99% stenosed.  Prox RCA to Mid RCA lesion is 25% stenosed.  Prox LAD lesion is 70% stenosed.  Mid LAD lesion is 70% stenosed.  Ost Cx to Mid Cx lesion is 15% stenosed.  The left ventricular systolic function is normal.  LV end diastolic pressure is normal.  The left ventricular ejection fraction is greater than 65% by visual estimate.  Conclusion Diagnostic cardiac cath from right  radial position Left ventriculogram suggested normal left ventricular function around 65% Left coronary system appears to have moderate to severe disease in the proximal LAD of around 70% as well as a 70% distal LAD lesion RCA had a 99% mid to distal lesion with calcification Patient was treated medically with Imdur Has been referred to tertiary care center for either complex high risk intervention versus coronary bypass surgery if medical therapy is  unsuccessful  ED Course: pt was found to have troponin 21--> 108, WBC 13.0, worsening renal function, pending Covid PCR, temperature normal, blood pressure 165/61, heart rate is 87, RR 23, oxygen saturation 98% on 2L of O2 in ED.  Chest x-ray negative for acute issues.  Patient is placed on progressive benefit observation, Dr. Humphrey Rolls of cardiology is consulted.  Review of Systems:   General: no fevers, chills, no body weight gain, has fatigue HEENT: no blurry vision, hearing changes or sore throat Respiratory: has dyspnea, coughing, no wheezing CV: has chest pain, no palpitations GI: had nausea and vomiting, no abdominal pain, diarrhea, constipation GU: no dysuria, burning on urination, increased urinary frequency, hematuria  Ext: has leg edema Neuro: no unilateral weakness, numbness, or tingling, no vision change or hearing loss Skin: no rash, no skin tear. MSK: No muscle spasm, no deformity, no limitation of range of movement in spin Heme: No easy bruising.  Travel history: No recent long distant travel.  Allergy:  Allergies  Allergen Reactions   Accupril [Quinapril Hcl] Other (See Comments)    Reaction: unknown    Past Medical History:  Diagnosis Date   Aortic valve stenosis    Carotid bruit    CHF (congestive heart failure) (HCC)    Chronic kidney disease    Diabetes mellitus without complication (HCC)    GERD (gastroesophageal reflux disease)    Heart murmur    Hyperlipidemia    Hypertension    Pneumonia    Vitamin D deficiency  Past Surgical History:  Procedure Laterality Date   COLONOSCOPY     COLONOSCOPY WITH PROPOFOL N/A 08/02/2019   Procedure: COLONOSCOPY WITH PROPOFOL;  Surgeon: Virgel Manifold, MD;  Location: [facility] ENDOSCOPY;  Service: Endoscopy;  Laterality: N/A;   CORONARY ANGIOPLASTY     ESOPHAGOGASTRODUODENOSCOPY N/A 06/26/2019   Procedure: ESOPHAGOGASTRODUODENOSCOPY (EGD);  Surgeon: Virgel Manifold, MD;  Location[facility] ENDOSCOPY;   Service: Endoscopy;  Laterality: N/A;   ESOPHAGOGASTRODUODENOSCOPY (EGD) WITH PROPOFOL N/A 08/02/2019   Procedure: ESOPHAGOGASTRODUODENOSCOPY (EGD) WITH PROPOFOL;  Surgeon: Virgel Manifold, MD;  Location: [facility] ENDOSCOPY;  Service: Endoscopy;  Laterality: N/A;   ESOPHAGOGASTRODUODENOSCOPY (EGD) WITH PROPOFOL N/A 04/03/2020   Procedure: ESOPHAGOGASTRODUODENOSCOPY (EGD) WITH PROPOFOL;  Surgeon: [name], MD;  Location: [facility] ENDOSCOPY;  Service: Gastroenterology;  Laterality: N/A;   LEFT HEART CATH AND CORONARY ANGIOGRAPHY N/A 01/23/2019   Procedure: LEFT HEART CATH AND CORONARY ANGIOGRAPHY;  Surgeon: Yolonda Kida, MD;  Location: [facility];  Service: Cardiovascular;  Laterality: N/A;   ovarian cyst removal      Social History:  reports that she quit smoking about 2 years ago. Her smoking use included cigarettes. She has a 40.00 pack-year smoking history. She has never used smokeless tobacco. She reports that she does not drink alcohol and does not use drugs.  Family History:  Family History  Problem Relation Age of Onset   Diabetes Neg Hx    Hypertension Neg Hx    Breast cancer Neg Hx      Prior to Admission medications   Medication Sig Start Date End Date Taking? Authorizing Provider  albuterol (VENTOLIN HFA) 108 (90 Base) MCG/ACT inhaler Inhale 2 puffs into the lungs every 6 (six) hours as needed for wheezing.    [provider]  amLODipine (NORVASC) 10 MG tablet Take 10 mg by mouth daily.    [provider]  aspirin EC 81 MG EC tablet Take 1 tablet (81 mg total) by mouth daily. 06/27/19   Gouru, Illene Silver, MD  atorvastatin (LIPITOR) 40 MG tablet Take 40 mg by mouth daily.  07/04/18   [provider]  cetirizine (ZYRTEC) 10 MG tablet Take 10 mg by mouth every other day.     [provider]  Choline Fenofibrate 45 MG capsule Take 45 mg by mouth daily.     [provider]  furosemide (LASIX) 40 MG tablet Take 40 mg by mouth 2  (two) times daily.    [provider]  glipiZIDE (GLUCOTROL XL) 10 MG 24 hr tablet Take 10 mg by mouth 2 (two) times daily.    [provider]  hydrALAZINE (APRESOLINE) 50 MG tablet Take 50 mg by mouth 3 (three) times daily.    [provider]  isosorbide mononitrate (IMDUR) 30 MG 24 hr tablet Take 30 mg by mouth 3 (three) times daily.     [provider]  LANTUS SOLOSTAR 100 UNIT/ML Solostar Pen Inject 16 Units into the skin at bedtime. 10/19/19   [provider]  metFORMIN (GLUCOPHAGE) 1000 MG tablet Take 1,000 mg by mouth 2 (two) times daily with a meal.     [provider]  metoprolol tartrate (LOPRESSOR) 50 MG tablet Take 50 mg by mouth 2 (two) times daily.    [provider]  Multiple Vitamin (MULTIVITAMIN WITH MINERALS) TABS tablet Take 1 tablet by mouth daily.    [provider]  nitroGLYCERIN (NITROSTAT) 0.4 MG SL tablet Place 1 tablet (0.4 mg total) under the tongue every  5 (five) minutes as needed for chest pain. 03/20/20   Nicole Kindred A, DO  sitaGLIPtin (JANUVIA) 100 MG tablet Take 100 mg by mouth daily.    [provider]  umeclidinium-vilanterol (ANORO ELLIPTA) 62.5-25 MCG/INH AEPB Inhale 1 puff into the lungs 1 day or 1 dose. 07/18/20   [provider]    Physical Exam: Vitals:   09/20/20 0357 09/20/20 0358 09/20/20 0635 09/20/20 0855  BP: (!) 148/100  (!) 165/61 (!) 161/64  Pulse: 88  71 67  Resp: (!) 23  16 16   Temp: 98.1 F (36.7 C)  98.6 F (37 C) 98.2 F (36.8 C)  TempSrc:    Oral  SpO2: 98%  97% 98%  Weight:  80 kg    Height:  5\' 7"  (1.702 m)     General: Not in acute distress HEENT:       Eyes: PERRL, EOMI, no scleral icterus.       ENT: No discharge from the ears and nose, no pharynx injection, no tonsillar enlargement.        Neck: No JVD, no bruit, no mass felt. Heme: No neck lymph node enlargement. Cardiac: Z6/W1, RRR, 2/6 systolic murmurs, No gallops or  rubs. Respiratory: No rales, wheezing, rhonchi or rubs. GI: Soft, nondistended, nontender, no rebound pain, no organomegaly, BS present. GU: No hematuria Ext: 1+ pitting leg edema bilaterally. 2+DP/PT pulse bilaterally. Musculoskeletal: No joint deformities, No joint redness or warmth, no limitation of ROM in spin. Skin: No rashes.  Neuro: Alert, oriented X3, cranial nerves II-XII grossly intact, moves all extremities normally.  Psych: Patient is not psychotic, no suicidal or hemocidal ideation.  Labs on Admission: I have personally reviewed following labs and imaging studies  CBC: Recent Labs  Lab 09/20/20 0403  WBC 13.0*  HGB 10.1*  HCT 32.0*  MCV 80.6  PLT 093*   Basic Metabolic Panel: Recent Labs  Lab 09/20/20 0403  NA 139  K 3.2*  CL 100  CO2 23  GLUCOSE 313*  BUN 23  CREATININE 1.94*  CALCIUM 9.0   GFR: Estimated Creatinine Clearance: 25.6 mL/min (A) (by C-G formula based on SCr of 1.94 mg/dL (H)). Liver Function Tests: No results for input(s): AST, ALT, ALKPHOS, BILITOT, PROT, ALBUMIN in the last 168 hours. No results for input(s): LIPASE, AMYLASE in the last 168 hours. No results for input(s): AMMONIA in the last 168 hours. Coagulation Profile: No results for input(s): INR, PROTIME in the last 168 hours. Cardiac Enzymes: No results for input(s): CKTOTAL, CKMB, CKMBINDEX, TROPONINI in the last 168 hours. BNP (last 3 results) No results for input(s): PROBNP in the last 8760 hours. HbA1C: No results for input(s): HGBA1C in the last 72 hours. CBG: Recent Labs  Lab 09/20/20 0639 09/20/20 1240  GLUCAP 317* 156*   Lipid Profile: No results for input(s): CHOL, HDL, LDLCALC, TRIG, CHOLHDL, LDLDIRECT in the last 72 hours. Thyroid Function Tests: No results for input(s): TSH, T4TOTAL, FREET4, T3FREE, THYROIDAB in the last 72 hours. Anemia Panel: No results for input(s): VITAMINB12, FOLATE, FERRITIN, TIBC, IRON, RETICCTPCT in the last 72 hours. Urine  analysis:    Component Value Date/Time   COLORURINE YELLOW (A) 03/31/2020 1457   APPEARANCEUR HAZY (A) 03/31/2020 1457   LABSPEC 1.015 03/31/2020 1457   PHURINE 5.0 03/31/2020 1457   GLUCOSEU NEGATIVE 03/31/2020 1457   HGBUR NEGATIVE 03/31/2020 1457   BILIRUBINUR NEGATIVE 03/31/2020 1457   KETONESUR NEGATIVE 03/31/2020 1457   PROTEINUR 100 (A) 03/31/2020 1457   NITRITE  NEGATIVE 03/31/2020 1457   LEUKOCYTESUR SMALL (A) 03/31/2020 1457   Sepsis Labs: @LABRCNTIP (procalcitonin:4,lacticidven:4) )No results found for this or any previous visit (from the past 240 hour(s)).   Radiological Exams on Admission: DG Chest 2 View  Result Date: 09/20/2020 CLINICAL DATA:  Chest pain and shortness of breath. EXAM: CHEST - 2 VIEW COMPARISON:  03/18/2020 and prior chest radiographs FINDINGS: The cardiomediastinal silhouette is unremarkable. Mild interstitial prominence is unchanged. Aortic atherosclerotic calcifications are again noted. There is no evidence of focal airspace disease, pulmonary edema, suspicious pulmonary nodule/mass, pleural effusion, or pneumothorax. No acute bony abnormalities are identified. IMPRESSION: No active cardiopulmonary disease. Aortic Atherosclerosis (ICD10-I70.0). Electronically Signed   By: Margarette Canada M.D.   On: 09/20/2020 05:42     EKG: Independently reviewed.  Sinus rhythm, QTC 433, nonspecific T wave change. Assessment/Plan Principal Problem:   Chest pain Active Problems:   Benign essential HTN   Type II diabetes mellitus with renal manifestations (HCC)   Hyperlipidemia   Elevated troponin   Acute renal failure superimposed on stage 3b chronic kidney disease (HCC)   CAD (coronary artery disease)   Leukocytosis   Anemia in chronic kidney disease   Hypokalemia   Chest pain, hx of CAD and elevated troponin: Trop 21 -->108. Dr. Humphrey Rolls of card is consulted.  - place to progressive unit for observation - Trend Trop - Repeat EKG in the am  - prn Nitroglycerin,  Morphine - aspirin, lipitor, Imdur - Risk factor stratification: will check FLP and A1C  -if trop continues to trend up, may start IV heparin -f/u Dr. Laurelyn Sickle recommendations  Addendum: Trop is up to 574 -->600.  -start IV heparin per Dr. Humphrey Rolls -Start 1 inch nitro every 8 hour   Chronic diastolic congestive heart failure: 2D echo on 03/19/2020 showed EF of 60 to 65% with grade 1 diastolic dysfunction.  Patient has 1+ leg edema, but chest x-ray did not show pulmonary edema, no JVD.  Does not seem to have CHF exacerbation. -Hold Lasix due to worsening renal function -Check BNP   Benign essential HTN -IV hydralazine as needed -Amlodipine, hydralazine, metoprolol  Type II diabetes mellitus with renal manifestations [facility]): Recent A1c 8.6, poorly controlled.  Patient is taking Metformin, glipizide, Januvia.  Patient is supposed to take Lantus, but did not take this week. -Decrease Lantus dose from 16 to 8 units daily -Sliding scale insulin  Hyperlipidemia -Lipitor, fenofibrate  Acute renal failure superimposed on stage 3b chronic kidney disease ([place]): Baseline creatinine 1.2-1.6.  Her creatinine is 1.94, BUN 23, likely due to dehydration and continuation of Lasix. -Hold Lasix -Avoid using renal toxic medications  Leukocytosis: WBC 13.0.  No source of infection identified.  No fever.  Likely reactive -Follow-up of breath CBC  Anemia in chronic kidney disease: Hemoglobin 10.1 -Follow-up of CBC  Hypokalemia: Potassium 3.2 -Repleted potassium -Check magnesium level     DVT ppx: SQ Heparin      Code Status: Full code per her daughter Family Communication:   Yes, patient's daughter by phone Disposition Plan:  Anticipate discharge back to previous environment Consults called:  Dr. Humphrey Rolls Admission status: progressive unit for obs  Status is: Observation  The patient remains OBS appropriate and will d/c before 2 midnights.  Dispo: The patient is from: Home              Anticipated  d/c is to: Home              Anticipated d/c date is: 1  day              Patient currently is not medically stable to d/c.           Date of Service 09/20/2020    Ivor Costa [facility]   If 7PM-7AM, please contact night-coverage [email or web address] 09/20/2020, 1:01 PM

## 2020-09-20 NOTE — ED Provider Notes (Signed)
[facility] Emergency Department Provider Note   ____________________________________________   First MD Initiated Contact with Patient 09/20/20 1032     (approximate)  I have reviewed the triage vital signs and the nursing notes.   HISTORY  Chief Complaint Chest Pain    HPI Brittney [name] is a 79 y.o. female with a stated past medical history of hypertension, diabetes, CHF, and CAD who presents for shortness of breath, nausea/vomiting, and dyspnea on exertion that began proximately 1 hour prior to arrival and has been persistent since onset.  Patient denies any worsening of this dyspnea on exertion but she is not nauseous or vomiting anymore.  Patient states that the symptoms resolved spontaneously.  Denies any fevers, vision changes, headaches, diarrhea, abdominal pain, dysuria, or weakness/numbness/paresthesias in any extremity.         Past Medical History:  Diagnosis Date  . Aortic valve stenosis   . Carotid bruit   . CHF (congestive heart failure) ([place])   . Chronic kidney disease   . Diabetes mellitus without complication ([place])   . GERD (gastroesophageal reflux disease)   . Heart murmur   . Hyperlipidemia   . Hypertension   . Pneumonia   . Vitamin D deficiency     Patient Active Problem List   Diagnosis Date Noted  . Abdominal pain 03/31/2020  . Chest pain 03/19/2020  . Acute renal failure superimposed on stage 3a chronic kidney disease ([place]) 03/19/2020  . CAD (coronary artery disease) 03/19/2020  . Chronic systolic CHF (congestive heart failure) ([place]) 03/19/2020  . Leukocytosis 03/19/2020  . Anemia in chronic kidney disease 02/26/2020  . Benign hypertensive kidney disease with chronic kidney disease 02/26/2020  . Congestive heart failure ([place]) 02/26/2020  . Hypokalemia 02/26/2020  . Secondary hyperparathyroidism of renal origin ([place]) 02/26/2020  . Stage 3a chronic kidney disease 02/26/2020  . Acute exacerbation of CHF  (congestive heart failure) ([place]) 02/01/2020  . Elevated troponin 02/01/2020  . Reflux esophagitis   . CLE (columnar lined esophagus)   . Duodenitis 06/25/2019  . Chronic heart failure with preserved ejection fraction (HFpEF) ([place]) 02/26/2019  . Benign essential HTN 01/17/2019  . DM (diabetes mellitus) ([place]) 01/17/2019  . Type II diabetes mellitus with renal manifestations ([place]) 07/24/2015  . GERD without esophagitis 07/24/2015  . Hyperlipidemia 07/24/2015    Past Surgical History:  Procedure Laterality Date  . COLONOSCOPY    . COLONOSCOPY WITH PROPOFOL N/A 08/02/2019   Procedure: COLONOSCOPY WITH PROPOFOL;  Surgeon: Virgel Manifold, MD;  Location: [facility] ENDOSCOPY;  Service: Endoscopy;  Laterality: N/A;  . CORONARY ANGIOPLASTY    . ESOPHAGOGASTRODUODENOSCOPY N/A 06/26/2019   Procedure: ESOPHAGOGASTRODUODENOSCOPY (EGD);  Surgeon: Virgel Manifold, MD;  Location: [facility] ENDOSCOPY;  Service: Endoscopy;  Laterality: N/A;  . ESOPHAGOGASTRODUODENOSCOPY (EGD) WITH PROPOFOL N/A 08/02/2019   Procedure: ESOPHAGOGASTRODUODENOSCOPY (EGD) WITH PROPOFOL;  Surgeon: Virgel Manifold, MD;  Location: [facility] ENDOSCOPY;  Service: Endoscopy;  Laterality: N/A;  . ESOPHAGOGASTRODUODENOSCOPY (EGD) WITH PROPOFOL N/A 04/03/2020   Procedure: ESOPHAGOGASTRODUODENOSCOPY (EGD) WITH PROPOFOL;  Surgeon: [name], MD;  Location: [facility] ENDOSCOPY;  Service: Gastroenterology;  Laterality: N/A;  . LEFT HEART CATH AND CORONARY ANGIOGRAPHY N/A 01/23/2019   Procedure: LEFT HEART CATH AND CORONARY ANGIOGRAPHY;  Surgeon: Yolonda Kida, MD;  Location: [place] CV LAB;  Service: Cardiovascular;  Laterality: N/A;  . ovarian cyst removal      Prior to Admission medications   Medication Sig Start Date End Date Taking? Authorizing Provider  albuterol (VENTOLIN  HFA) 108 (90 Base) MCG/ACT inhaler Inhale 2 puffs into the lungs every 6 (six) hours as needed for wheezing.    [provider]  amLODipine (NORVASC) 10  MG tablet Take 10 mg by mouth daily.    [provider]  aspirin EC 81 MG EC tablet Take 1 tablet (81 mg total) by mouth daily. 06/27/19   Gouru, Illene Silver, MD  atorvastatin (LIPITOR) 40 MG tablet Take 40 mg by mouth daily.  07/04/18   [provider]  cetirizine (ZYRTEC) 10 MG tablet Take 10 mg by mouth every other day.     [provider]  Choline Fenofibrate 45 MG capsule Take 45 mg by mouth daily.     [provider]  furosemide (LASIX) 40 MG tablet Take 40 mg by mouth 2 (two) times daily.    [provider]  glipiZIDE (GLUCOTROL XL) 10 MG 24 hr tablet Take 10 mg by mouth 2 (two) times daily.    [provider]  hydrALAZINE (APRESOLINE) 50 MG tablet Take 50 mg by mouth 3 (three) times daily.    [provider]  isosorbide mononitrate (IMDUR) 30 MG 24 hr tablet Take 30 mg by mouth 3 (three) times daily.     [provider]  LANTUS SOLOSTAR 100 UNIT/ML Solostar Pen Inject 16 Units into the skin at bedtime. 10/19/19   [provider]  metFORMIN (GLUCOPHAGE) 1000 MG tablet Take 1,000 mg by mouth 2 (two) times daily with a meal.     [provider]  metoprolol tartrate (LOPRESSOR) 50 MG tablet Take 50 mg by mouth 2 (two) times daily.    [provider]  Multiple Vitamin (MULTIVITAMIN WITH MINERALS) TABS tablet Take 1 tablet by mouth daily.    [provider]  nitroGLYCERIN (NITROSTAT) 0.4 MG SL tablet Place 1 tablet (0.4 mg total) under the tongue every 5 (five) minutes as needed for chest pain. 03/20/20   Nicole Kindred A, DO  sitaGLIPtin (JANUVIA) 100 MG tablet Take 100 mg by mouth daily.    [provider]  umeclidinium-vilanterol (ANORO ELLIPTA) 62.5-25 MCG/INH AEPB Inhale 1 puff into the lungs 1 day or 1 dose. 07/18/20   [provider]    Allergies Accupril [quinapril hcl]  Family History  Problem Relation Age of Onset  . Diabetes Neg Hx   . Hypertension Neg Hx   . Breast  cancer Neg Hx     Social History Social History   Tobacco Use  . Smoking status: Former Smoker    Packs/day: 1.00    Years: 40.00    Pack years: 40.00    Types: Cigarettes    Quit date: 07/27/2018    Years since quitting: 2.1  . Smokeless tobacco: Never Used  Vaping Use  . Vaping Use: Never used  Substance Use Topics  . Alcohol use: No  . Drug use: No    Review of Systems Constitutional: No fever/chills Eyes: No visual changes. ENT: No sore throat. Cardiovascular: Denies chest pain. Respiratory: Endorses shortness of breath. Gastrointestinal: No abdominal pain.  No nausea, no vomiting.  No diarrhea. Genitourinary: Negative for dysuria. Musculoskeletal: Negative for acute arthralgias Skin: Negative for rash. Neurological: Negative for headaches, weakness/numbness/paresthesias in any extremity Psychiatric: Negative for suicidal ideation/homicidal ideation   ____________________________________________   PHYSICAL EXAM:  VITAL SIGNS: ED Triage Vitals  Enc Vitals Group     BP 09/20/20 0357 (!) 148/100     Pulse Rate 09/20/20 0357 88     Resp  09/20/20 0357 (!) 23     Temp 09/20/20 0357 98.1 F (36.7 C)     Temp Source 09/20/20 0855 Oral     SpO2 09/20/20 0357 98 %     Weight 09/20/20 0358 176 lb 5.9 oz (80 kg)     Height 09/20/20 0358 5\' 7"  (1.702 m)     Head Circumference --      Peak Flow --      Pain Score 09/20/20 0358 8     Pain Loc --      Pain Edu? --      Excl. in [place]? --    Constitutional: Alert and oriented. Well appearing and in no acute distress. Eyes: Conjunctivae are normal. PERRL. Head: Atraumatic. Nose: No congestion/rhinnorhea. Mouth/Throat: Mucous membranes are moist. Neck: No stridor Cardiovascular: Normal rate, regular rhythm. Grossly normal heart sounds.  Good peripheral circulation. Respiratory: Normal respiratory effort.  No retractions. Gastrointestinal: Soft and nontender. No distention. Musculoskeletal: No lower extremity  tenderness nor edema.  No joint effusions. Neurologic:  Normal speech and language. No gross focal neurologic deficits are appreciated. Skin:  Skin is warm and dry. No rash noted. Psychiatric: Mood and affect are normal. Speech and behavior are normal.  ____________________________________________   LABS (all labs ordered are listed, but only abnormal results are displayed)  Labs Reviewed  BASIC METABOLIC PANEL - Abnormal; Notable for the following components:      Result Value   Potassium 3.2 (*)    Glucose, Bld 313 (*)    Creatinine, Ser 1.94 (*)    GFR calc non Af Amer 24 (*)    GFR calc Af Amer 28 (*)    Anion gap 16 (*)    All other components within normal limits  CBC - Abnormal; Notable for the following components:   WBC 13.0 (*)    Hemoglobin 10.1 (*)    HCT 32.0 (*)    MCH 25.4 (*)    Platelets 498 (*)    All other components within normal limits  GLUCOSE, CAPILLARY - Abnormal; Notable for the following components:   Glucose-Capillary 317 (*)    All other components within normal limits  TROPONIN I (HIGH SENSITIVITY) - Abnormal; Notable for the following components:   Troponin I (High Sensitivity) 21 (*)    All other components within normal limits  TROPONIN I (HIGH SENSITIVITY) - Abnormal; Notable for the following components:   Troponin I (High Sensitivity) 108 (*)    All other components within normal limits   ____________________________________________  EKG  ED ECG REPORT I, Naaman Plummer, the attending physician, personally viewed and interpreted this ECG.  Date: 09/20/2020 EKG Time: 0850 Rate: 63 Rhythm: normal sinus rhythm QRS Axis: normal Intervals: normal ST/T Wave abnormalities: normal Narrative Interpretation: no evidence of acute ischemia  ____________________________________________  RADIOLOGY  ED MD interpretation: 2 view x-ray of the chest shows no evidence of acute pneumonia, pneumothorax, or widened mediastinum  Official  radiology report(s): DG Chest 2 View  Result Date: 09/20/2020 CLINICAL DATA:  Chest pain and shortness of breath. EXAM: CHEST - 2 VIEW COMPARISON:  03/18/2020 and prior chest radiographs FINDINGS: The cardiomediastinal silhouette is unremarkable. Mild interstitial prominence is unchanged. Aortic atherosclerotic calcifications are again noted. There is no evidence of focal airspace disease, pulmonary edema, suspicious pulmonary nodule/mass, pleural effusion, or pneumothorax. No acute bony abnormalities are identified. IMPRESSION: No active cardiopulmonary disease. Aortic Atherosclerosis (ICD10-I70.0). Electronically Signed   By: Margarette Canada M.D.   On: 09/20/2020 05:42  ____________________________________________   PROCEDURES  Procedure(s) performed (including Critical Care):  .1-3 Lead EKG Interpretation Performed by: Naaman Plummer, MD Authorized by: Naaman Plummer, MD     Interpretation: normal     ECG rate:  66   ECG rate assessment: normal     Rhythm: sinus rhythm     Ectopy: none     Conduction: normal       ____________________________________________   INITIAL IMPRESSION / ASSESSMENT AND PLAN / ED COURSE  As part of my medical decision making, I reviewed the following data within the [place] notes reviewed and incorporated, Labs reviewed, EKG interpreted, Old chart reviewed, Radiograph reviewed and Notes from prior ED visits reviewed and incorporated        Workup: ECG, CXR, CBC, BMP, Troponin Findings: ECG: No overt evidence of STEMI. No evidence of Brugadas sign, delta wave, epsilon wave, significantly prolonged QTc, or malignant arrhythmia HS Troponin: Negative x1 Other Labs unremarkable for emergent problems. CXR: Without PTX, PNA, or widened mediastinum Last Stress Test: Unknown Last Heart Catheterization: 12/2018 HEART Score: 5  Given History, Exam, and Workup I have low suspicion for ACS, Pneumothorax, Pneumonia, Pulmonary  Embolus, Tamponade, Aortic Dissection or other emergent problem as a cause for this presentation.   High Risk Chest Pain Patient at increased risk for Major Adverse Cardiac Event (AMI, PCI, CABG, death) Interventions: ASA 324mg  Defer Heparin drip as patient pain free at this time,   Disposition: Admit for continued cardiac monitoring and trending of troponins as well as further evaluation for potential inpatient stress testing vs cardiac catheterization and coronary angiography.      ____________________________________________   FINAL CLINICAL IMPRESSION(S) / ED DIAGNOSES  Final diagnoses:  None     ED Discharge Orders    None       Note:  This document was prepared using Dragon voice recognition software and may include unintentional dictation errors.   Naaman Plummer, MD 09/20/20 [phone]

## 2020-09-20 NOTE — ED Notes (Signed)
Dr. Karsten Ro and Dr. Blaine Hamper made aware of pt elevating repeat trop levels at this time

## 2020-09-20 NOTE — ED Triage Notes (Signed)
Patient coming [facility] from home for chest pain, SOB, chills, nausea.

## 2020-09-20 NOTE — Consult Note (Addendum)
[facility] for Heparin Infusion Indication: chest pain/ACS  Allergies  Allergen Reactions  . Accupril [Quinapril Hcl] Other (See Comments)    Reaction: unknown    Patient Measurements: Height: 5\' 7"  (170.2 cm) Weight: 80 kg (176 lb 5.9 oz) IBW/kg (Calculated) : 61.6 Heparin Dosing Weight: 77.9 kg  Vital Signs: Temp: 98.2 F (36.8 C) (09/25 0855) Temp Source: Oral (09/25 0855) BP: 182/67 (09/25 1554) Pulse Rate: 61 (09/25 1615)  Labs: Recent Labs    09/20/20 0403 09/20/20 0403 09/20/20 0636 09/20/20 1238 09/20/20 1647  HGB 10.1*  --   --   --   --   HCT 32.0*  --   --   --   --   PLT 498*  --   --   --   --   CREATININE 1.94*  --   --   --   --   TROPONINIHS 21*   < > 108* 574* 600*   < > = values in this interval not displayed.    Estimated Creatinine Clearance: 25.6 mL/min (A) (by C-G formula based on SCr of 1.94 mg/dL (H)).   Medical History: Past Medical History:  Diagnosis Date  . Aortic valve stenosis   . Carotid bruit   . CHF (congestive heart failure) ([place])   . Chronic kidney disease   . Diabetes mellitus without complication ([place])   . GERD (gastroesophageal reflux disease)   . Heart murmur   . Hyperlipidemia   . Hypertension   . Pneumonia   . Vitamin D deficiency     Medications:  No anticoagulation prior to admission per chart review  Assessment: Patient is a 79 y/o F with medical history including three-vessel coronary artery disease, aortic stenosis, CHF who presented with chest pain and shortness of breath. Troponins 108 >> 574 >> 600. Pharmacy has been consulted to initiate heparin infusion for suspected ACS.   Baseline CBC with anemia (Hgb 10.1) which appears c/w baseline. Platelets slightly above ULN. Baseline aPTT and PT-INR are pending.  Patient has been on standard DVT prophylaxis with SQ heparin 5000 unit q8h (last dose 9/25 at 1600). Will give 1/2 bolus given close proximity of last SQ heparin  dose.  Goal of Therapy:  Heparin level 0.3-0.7 units/ml Monitor platelets by anticoagulation protocol: Yes   Plan:  --Heparin 2000 unit IV bolus x 1 followed by continuous infusion at 900 units/hr --Heparin level 8 hours after initiation of infusion --Daily CBC per protocol  Benita Gutter 09/20/2020,6:59 PM

## 2020-09-20 NOTE — ED Notes (Signed)
Date and time results received: 09/20/20 7:57 AM  (use smartphrase ".now" to insert current time)  Test: Trop Critical Value: 21 -> 108  Name of Provider Notified: [place]  Orders Received? Or Actions Taken?: VORB to repeat EKG

## 2020-09-20 NOTE — ED Notes (Signed)
Multiple attempts made to obtain IV access at this time but unable to place an IV that is appropriate for use at this time

## 2020-09-21 ENCOUNTER — Other Ambulatory Visit: Payer: Self-pay

## 2020-09-21 DIAGNOSIS — R079 Chest pain, unspecified: Secondary | ICD-10-CM | POA: Diagnosis not present

## 2020-09-21 DIAGNOSIS — N179 Acute kidney failure, unspecified: Secondary | ICD-10-CM | POA: Diagnosis not present

## 2020-09-21 DIAGNOSIS — R778 Other specified abnormalities of plasma proteins: Secondary | ICD-10-CM | POA: Diagnosis not present

## 2020-09-21 DIAGNOSIS — I10 Essential (primary) hypertension: Secondary | ICD-10-CM | POA: Diagnosis not present

## 2020-09-21 LAB — CBC
HCT: 28.1 % — ABNORMAL LOW (ref 36.0–46.0)
Hemoglobin: 9 g/dL — ABNORMAL LOW (ref 12.0–15.0)
MCH: 25.7 pg — ABNORMAL LOW (ref 26.0–34.0)
MCHC: 32 g/dL (ref 30.0–36.0)
MCV: 80.3 fL (ref 80.0–100.0)
Platelets: 425 10*3/uL — ABNORMAL HIGH (ref 150–400)
RBC: 3.5 MIL/uL — ABNORMAL LOW (ref 3.87–5.11)
RDW: 14.6 % (ref 11.5–15.5)
WBC: 9.5 10*3/uL (ref 4.0–10.5)
nRBC: 0 % (ref 0.0–0.2)

## 2020-09-21 LAB — HEPARIN LEVEL (UNFRACTIONATED)
Heparin Unfractionated: 0.24 IU/mL — ABNORMAL LOW (ref 0.30–0.70)
Heparin Unfractionated: 0.39 IU/mL (ref 0.30–0.70)

## 2020-09-21 LAB — GLUCOSE, CAPILLARY
Glucose-Capillary: 257 mg/dL — ABNORMAL HIGH (ref 70–99)
Glucose-Capillary: 370 mg/dL — ABNORMAL HIGH (ref 70–99)

## 2020-09-21 LAB — LIPID PANEL
Cholesterol: 139 mg/dL (ref 0–200)
HDL: 25 mg/dL — ABNORMAL LOW (ref >40)
LDL Cholesterol: 61 mg/dL (ref 0–99)
Total CHOL/HDL Ratio: 5.6 RATIO
Triglycerides: 263 mg/dL — ABNORMAL HIGH (ref <150)
VLDL: 53 mg/dL — ABNORMAL HIGH (ref 0–40)

## 2020-09-21 LAB — TROPONIN I (HIGH SENSITIVITY): Troponin I (High Sensitivity): 383 ng/L

## 2020-09-21 LAB — MAGNESIUM: Magnesium: 2.2 mg/dL (ref 1.7–2.4)

## 2020-09-21 MED ORDER — METOPROLOL SUCCINATE ER 100 MG PO TB24
100.0000 mg | ORAL_TABLET | Freq: Every day | ORAL | 1 refills | Status: DC
Start: 2020-09-22 — End: 2020-09-21

## 2020-09-21 MED ORDER — LOSARTAN POTASSIUM 25 MG PO TABS
25.0000 mg | ORAL_TABLET | Freq: Every day | ORAL | 1 refills | Status: DC
Start: 2020-09-21 — End: 2020-09-21

## 2020-09-21 MED ORDER — LOSARTAN POTASSIUM 25 MG PO TABS
25.0000 mg | ORAL_TABLET | Freq: Every day | ORAL | 1 refills | Status: DC
Start: 2020-09-21 — End: 2021-01-09

## 2020-09-21 MED ORDER — HEPARIN BOLUS VIA INFUSION
1150.0000 [IU] | Freq: Once | INTRAVENOUS | Status: AC
  Administered 2020-09-21: 1150 [IU] via INTRAVENOUS
  Filled 2020-09-21: qty 1150

## 2020-09-21 MED ORDER — SPIRONOLACTONE 25 MG PO TABS
25.0000 mg | ORAL_TABLET | Freq: Every day | ORAL | 1 refills | Status: DC
Start: 2020-09-21 — End: 2020-10-07

## 2020-09-21 MED ORDER — CLOPIDOGREL BISULFATE 75 MG PO TABS
75.0000 mg | ORAL_TABLET | Freq: Every day | ORAL | 1 refills | Status: DC
Start: 2020-09-22 — End: 2021-01-16

## 2020-09-21 MED ORDER — METOPROLOL SUCCINATE ER 100 MG PO TB24: 100.0000 mg | ORAL_TABLET | Freq: Every day | ORAL | 1 refills | Status: AC

## 2020-09-21 MED ORDER — SPIRONOLACTONE 25 MG PO TABS
25.0000 mg | ORAL_TABLET | Freq: Every day | ORAL | Status: DC
  Administered 2020-09-21: 25 mg via ORAL
  Filled 2020-09-21: qty 1

## 2020-09-21 MED ORDER — LOSARTAN POTASSIUM 50 MG PO TABS
25.0000 mg | ORAL_TABLET | Freq: Every day | ORAL | Status: DC
  Administered 2020-09-21: 25 mg via ORAL
  Filled 2020-09-21: qty 1

## 2020-09-21 MED ORDER — CLOPIDOGREL BISULFATE 75 MG PO TABS
75.0000 mg | ORAL_TABLET | Freq: Every day | ORAL | 1 refills | Status: DC
Start: 2020-09-22 — End: 2020-09-21

## 2020-09-21 MED ORDER — HYDRALAZINE HCL 20 MG/ML IJ SOLN: 10.0000 mg | INTRAMUSCULAR | Status: DC | PRN

## 2020-09-21 MED ORDER — SPIRONOLACTONE 25 MG PO TABS
25.0000 mg | ORAL_TABLET | Freq: Every day | ORAL | 1 refills | Status: DC
Start: 2020-09-21 — End: 2020-09-21

## 2020-09-21 NOTE — ED Notes (Signed)
This RN to bedside, pt visualized in NAD at this time. Pt states she was told by MD that she could go home today. Pt requesting her home meds be sent to pharmacy in [place]. Pt denies further needs. Pt noted to have eaten 100% of breakfast at this time. Call bell remains within reach.

## 2020-09-21 NOTE — ED Notes (Signed)
Pt attempted to call 2 different people regarding discharge, requesting this RN call her daughter Otila Kluver in regards to finding a ride for discharge. Call bell remains within reach. No further needs stated at this time.

## 2020-09-21 NOTE — Progress Notes (Signed)
SUBJECTIVE: Patient is feeling much better denies any chest pain or shortness of breath   Vitals:   09/21/20 0800 09/21/20 0900 09/21/20 0930 09/21/20 1000  BP: (!) 150/68 (!) 137/58 137/65 (!) 143/56  Pulse: 72 67 65 72  Resp: 16 16 16  (!) 28  Temp:  98.2 F (36.8 C)    TempSrc:  Oral    SpO2: 96% 96% 97% 98%  Weight:      Height:        Intake/Output Summary (Last 24 hours) at 09/21/2020 1031 Last data filed at 09/21/2020 0908 Gross per 24 hour  Intake 146.04 ml  Output --  Net 146.04 ml    LABS: Basic Metabolic Panel: Recent Labs    09/20/20 0403 09/21/20 0400  NA 139  --   K 3.2*  --   CL 100  --   CO2 23  --   GLUCOSE 313*  --   BUN 23  --   CREATININE 1.94*  --   CALCIUM 9.0  --   MG  --  2.2   Liver Function Tests: No results for input(s): AST, ALT, ALKPHOS, BILITOT, PROT, ALBUMIN in the last 72 hours. No results for input(s): LIPASE, AMYLASE in the last 72 hours. CBC: Recent Labs    09/20/20 0403 09/21/20 0400  WBC 13.0* 9.5  HGB 10.1* 9.0*  HCT 32.0* 28.1*  MCV 80.6 80.3  PLT 498* 425*   Cardiac Enzymes: No results for input(s): CKTOTAL, CKMB, CKMBINDEX, TROPONINI in the last 72 hours. BNP: Invalid input(s): POCBNP D-Dimer: No results for input(s): DDIMER in the last 72 hours. Hemoglobin A1C: Recent Labs    09/20/20 1238  HGBA1C 10.5*   Fasting Lipid Panel: Recent Labs    09/21/20 0400  CHOL 139  HDL 25*  LDLCALC 61  TRIG 263*  CHOLHDL 5.6   Thyroid Function Tests: No results for input(s): TSH, T4TOTAL, T3FREE, THYROIDAB in the last 72 hours.  Invalid input(s): FREET3 Anemia Panel: No results for input(s): VITAMINB12, FOLATE, FERRITIN, TIBC, IRON, RETICCTPCT in the last 72 hours.   PHYSICAL EXAM General: Well developed, well nourished, in no acute distress HEENT:  Normocephalic and atramatic Neck:  No JVD.  Lungs: Clear bilaterally to auscultation and percussion. Heart: HRRR . Normal S1 and S2 without gallops or murmurs.   Abdomen: Bowel sounds are positive, abdomen soft and non-tender  Msk:  Back normal, normal gait. Normal strength and tone for age. Extremities: No clubbing, cyanosis or edema.   Neuro: Alert and oriented X 3. Psych:  Good affect, responds appropriately  TELEMETRY: Sinus rhythm  ASSESSMENT AND PLAN: Three-vessel coronary artery disease initially treated with medications presented with shortness of breath and troponin elevation up to 600.  Patient had a cardiac catheterization January this year which showed three-vessel coronary artery disease and if medical therapy failed advised CABG or high risk intervention.  Started the patient on aggressive medical therapy while in hospitalization and appears to be improving.  But will discuss with Dr. Alycia Rossetti if he wants to do another catheterization.  Principal Problem:   Chest pain Active Problems:   Benign essential HTN   Type II diabetes mellitus with renal manifestations (HCC)   Hyperlipidemia   Elevated troponin   Acute renal failure superimposed on stage 3b chronic kidney disease (HCC)   CAD (coronary artery disease)   Leukocytosis   Anemia in chronic kidney disease   Hypokalemia    Nabeeha Badertscher A, MD, [facility] 09/21/2020 10:31 AM

## 2020-09-21 NOTE — ED Notes (Signed)
Pt up to the bathroom at this time. Pt able to have a bowel movement. Pt back to bed without incident. Pt noted to be a standby assist.

## 2020-09-21 NOTE — ED Notes (Signed)
This RN to bedside, medications administered as ordered at this time. Pt repositioned in bed. Call bell within reach of patient at this time. Pt continues to rest in bed watching TV at this time.

## 2020-09-21 NOTE — Discharge Summary (Signed)
Physician Discharge Summary  Brittney [name] JJH:[id number] DOB: [birth year] DOA: 09/20/2020  PCP: Elisabeth Cara, NP  Admit date: 09/20/2020 Discharge date: 09/21/2020  Admitted From: home Disposition:  home  Recommendations for Outpatient Follow-up:  1. Follow up with PCP in 1-2 weeks 2. Please obtain BMP/CBC in one week 3. Please follow up with Dr. Clayborn Bigness, cardiologist, in 1 week  Home Health: no  Equipment/Devices: none   Discharge Condition: Stable  CODE STATUS: Full  Diet recommendation: Heart Healthy / Carb Modified    Discharge Diagnoses: Principal Problem:   Chest pain Active Problems:   Benign essential HTN   Type II diabetes mellitus with renal manifestations (HCC)   Hyperlipidemia   Elevated troponin   Acute renal failure superimposed on stage 3b chronic kidney disease (HCC)   CAD (coronary artery disease)   Leukocytosis   Anemia in chronic kidney disease   Hypokalemia    Summary of HPI and Hospital Course:  Brittney [name] is a 79 y.o. female with medical history significant of HTN, HLD, DM, CAD s/p cath on 01/16/2019 which showed multivessel disease for which she was treated medically with plan for consideration of either high risk PCI or CABG if medical management fails, dCHF (Echo in 02/2020 EF 60-65%, grade 1 DD), AS, CKD-IIIb, GERD, who presented to the ED on 09/20/20 with substernal chest pain and shortness of breath.  Evaluation in the ED: Afebrile, BP 165/61, HR 87, RR 23, spO2 98% on 2 L/min oxygen. Labs showed WBC 13.0k, Cr 1.94 (prior 1.3-1.6 in April).  HS-Troponin 21>>108>>574.  BNP 147.8, K 3.2.  Negative respiratory viral panel including Covid-19 PCR.   Chest xray showed no active cardiopulmonary disease.  Admitted to hospitalist service for observation.  Cardiology, Dr. [place]rey Rolls, consulted.     Cardiac cath was deferred due to renal function Started on Alactone, Cozaar, Toprol (d/c Lopressor),  Stopped amlodipine. Follow up with Dr. Clayborn Bigness  in 1 week  For diabetes, stopped metformin at d/c gfr<30.   Discharge Instructions   Discharge Instructions    (HEART FAILURE PATIENTS) Call MD:  Anytime you have any of the following symptoms: 1) 3 pound weight gain in 24 hours or 5 pounds in 1 week 2) shortness of breath, with or without a dry hacking cough 3) swelling in the hands, feet or stomach 4) if you have to sleep on extra pillows at night in order to breathe.   Complete by: As directed    Call MD for:  extreme fatigue   Complete by: As directed    Call MD for:  persistant dizziness or light-headedness   Complete by: As directed    Call MD for:  severe uncontrolled pain   Complete by: As directed    Call MD for:  temperature >100.4   Complete by: As directed    Diet - low sodium heart healthy   Complete by: As directed    Increase activity slowly   Complete by: As directed      Allergies as of 09/21/2020      Reactions   Accupril [quinapril Hcl] Other (See Comments)   Reaction: unknown      Medication List    STOP taking these medications   amLODipine 10 MG tablet Commonly known as: NORVASC   metFORMIN 1000 MG tablet Commonly known as: GLUCOPHAGE   metoprolol tartrate 50 MG tablet Commonly known as: LOPRESSOR     TAKE these medications   albuterol 108 (90 Base) MCG/ACT inhaler Commonly known as:  VENTOLIN HFA Inhale 2 puffs into the lungs every 6 (six) hours as needed for wheezing.   aspirin 81 MG EC tablet Take 1 tablet (81 mg total) by mouth daily.   atorvastatin 40 MG tablet Commonly known as: LIPITOR Take 40 mg by mouth daily.   cetirizine 10 MG tablet Commonly known as: ZYRTEC Take 10 mg by mouth every other day.   Choline Fenofibrate 45 MG capsule Take 45 mg by mouth daily.   clopidogrel 75 MG tablet Commonly known as: PLAVIX Take 1 tablet (75 mg total) by mouth daily. Start taking on: September 22, 2020   furosemide 40 MG tablet Commonly known as: LASIX Take 40 mg by mouth 2 (two)  times daily.   glipiZIDE 10 MG 24 hr tablet Commonly known as: GLUCOTROL XL Take 10 mg by mouth 2 (two) times daily.   hydrALAZINE 25 MG tablet Commonly known as: APRESOLINE Take 25 mg by mouth 2 (two) times daily.   isosorbide mononitrate 30 MG 24 hr tablet Commonly known as: IMDUR Take 30 mg by mouth 3 (three) times daily.   Lantus SoloStar 100 UNIT/ML Solostar Pen Generic drug: insulin glargine Inject 16 Units into the skin at bedtime.   losartan 25 MG tablet Commonly known as: COZAAR Take 1 tablet (25 mg total) by mouth daily.   metoprolol succinate 100 MG 24 hr tablet Commonly known as: TOPROL-XL Take 1 tablet (100 mg total) by mouth daily. Take with or immediately following a meal. Start taking on: September 22, 2020   multivitamin with minerals Tabs tablet Take 1 tablet by mouth daily.   nitroGLYCERIN 0.4 MG SL tablet Commonly known as: NITROSTAT Place 1 tablet (0.4 mg total) under the tongue every 5 (five) minutes as needed for chest pain.   pantoprazole 40 MG tablet Commonly known as: PROTONIX Take 40 mg by mouth daily.   saccharomyces boulardii 250 MG capsule Commonly known as: FLORASTOR Take 250 mg by mouth 2 (two) times daily.   sitaGLIPtin 100 MG tablet Commonly known as: JANUVIA Take 100 mg by mouth daily.   spironolactone 25 MG tablet Commonly known as: ALDACTONE Take 1 tablet (25 mg total) by mouth daily.   Stiolto Respimat 2.5-2.5 MCG/ACT Aers Generic drug: Tiotropium Bromide-Olodaterol SMARTSIG:2 Puff(s) Via Inhaler Daily       Allergies  Allergen Reactions  . Accupril [Quinapril Hcl] Other (See Comments)    Reaction: unknown    Consultations:  cardiology   Procedures/Studies: DG Chest 2 View  Result Date: 09/20/2020 CLINICAL DATA:  Chest pain and shortness of breath. EXAM: CHEST - 2 VIEW COMPARISON:  03/18/2020 and prior chest radiographs FINDINGS: The cardiomediastinal silhouette is unremarkable. Mild interstitial prominence  is unchanged. Aortic atherosclerotic calcifications are again noted. There is no evidence of focal airspace disease, pulmonary edema, suspicious pulmonary nodule/mass, pleural effusion, or pneumothorax. No acute bony abnormalities are identified. IMPRESSION: No active cardiopulmonary disease. Aortic Atherosclerosis (ICD10-I70.0). Electronically Signed   By: Margarette Canada M.D.   On: 09/20/2020 05:42       Subjective: Patient seen this AM.  No chest pain, SOB or other complaints.  No acute events reported.   Discharge Exam: Vitals:   09/21/20 1000 09/21/20 1100  BP: (!) 143/56 (!) 136/53  Pulse: 72 70  Resp: (!) 28 (!) 22  Temp:    SpO2: 98% 95%   Vitals:   09/21/20 0900 09/21/20 0930 09/21/20 1000 09/21/20 1100  BP: (!) 137/58 137/65 (!) 143/56 (!) 136/53  Pulse: 67 65 72  70  Resp: 16 16 (!) 28 (!) 22  Temp: 98.2 F (36.8 C)     TempSrc: Oral     SpO2: 96% 97% 98% 95%  Weight:      Height:        General: Pt is alert, awake, not in acute distress Cardiovascular: RRR, S1/S2 +, no rubs, no gallops Respiratory: CTA bilaterally, no wheezing, no rhonchi Abdominal: Soft, NT, ND, bowel sounds + Extremities: trace edema, no cyanosis    The results of significant diagnostics from this hospitalization (including imaging, microbiology, ancillary and laboratory) are listed below for reference.     Microbiology: Recent Results (from the past 240 hour(s))  Respiratory Panel by RT PCR (Flu A&B, Covid) - Nasopharyngeal Swab     Status: None   Collection Time: 09/20/20 12:38 PM   Specimen: Nasopharyngeal Swab  Result Value Ref Range Status   SARS Coronavirus 2 by RT PCR NEGATIVE NEGATIVE Final    Comment: (NOTE) SARS-CoV-2 target nucleic acids are NOT DETECTED.  The SARS-CoV-2 RNA is generally detectable in upper respiratoy specimens during the acute phase of infection. The lowest concentration of SARS-CoV-2 viral copies this assay can detect is 131 copies/mL. A negative result  does not preclude SARS-Cov-2 infection and should not be used as the sole basis for treatment or other patient management decisions. A negative result may occur with  improper specimen collection/handling, submission of specimen other than nasopharyngeal swab, presence of viral mutation(s) within the areas targeted by this assay, and inadequate number of viral copies (<131 copies/mL). A negative result must be combined with clinical observations, patient history, and epidemiological information. The expected result is Negative.  Fact Sheet for Patients:  PinkCheek.be  Fact Sheet for Healthcare Providers:  GravelBags.it  This test is no t yet approved or cleared by the [place] FDA and  has been authorized for detection and/or diagnosis of SARS-CoV-2 by FDA under an Emergency Use Authorization (EUA). This EUA will remain  in effect (meaning this test can be used) for the duration of the COVID-19 declaration under Section 564(b)(1) of the Act, 21 U.S.C. section 360bbb-3(b)(1), unless the authorization is terminated or revoked sooner.     Influenza A by PCR NEGATIVE NEGATIVE Final   Influenza B by PCR NEGATIVE NEGATIVE Final    Comment: (NOTE) The Xpert Xpress SARS-CoV-2/FLU/RSV assay is intended as an aid in  the diagnosis of influenza from Nasopharyngeal swab specimens and  should not be used as a sole basis for treatment. Nasal washings and  aspirates are unacceptable for Xpert Xpress SARS-CoV-2/FLU/RSV  testing.  Fact Sheet for Patients: PinkCheek.be  Fact Sheet for Healthcare Providers: GravelBags.it  This test is not yet approved or cleared by the [place] FDA and  has been authorized for detection and/or diagnosis of SARS-CoV-2 by  FDA under an Emergency Use Authorization (EUA). This EUA will remain  in effect (meaning this test can be used) for  the duration of the  Covid-19 declaration under Section 564(b)(1) of the Act, 21  U.S.C. section 360bbb-3(b)(1), unless the authorization is  terminated or revoked. Performed at [facility], [address] [place], [place] [address]      Labs: BNP (last 3 results) Recent Labs    02/02/20 0559 03/19/20 1028 09/20/20 0403  BNP 247.0* 112.0* 885.0*   Basic Metabolic Panel: Recent Labs  Lab 09/20/20 0403 09/21/20 0400  NA 139  --   K 3.2*  --   CL 100  --  CO2 23  --   GLUCOSE 313*  --   BUN 23  --   CREATININE 1.94*  --   CALCIUM 9.0  --   MG  --  2.2   Liver Function Tests: No results for input(s): AST, ALT, ALKPHOS, BILITOT, PROT, ALBUMIN in the last 168 hours. No results for input(s): LIPASE, AMYLASE in the last 168 hours. No results for input(s): AMMONIA in the last 168 hours. CBC: Recent Labs  Lab 09/20/20 0403 09/21/20 0400  WBC 13.0* 9.5  HGB 10.1* 9.0*  HCT 32.0* 28.1*  MCV 80.6 80.3  PLT 498* 425*   Cardiac Enzymes: No results for input(s): CKTOTAL, CKMB, CKMBINDEX, TROPONINI in the last 168 hours. BNP: Invalid input(s): POCBNP CBG: Recent Labs  Lab 09/20/20 0639 09/20/20 1240 09/20/20 1647 09/20/20 2158 09/21/20 0758  GLUCAP 317* 156* 142* 268* 257*   D-Dimer No results for input(s): DDIMER in the last 72 hours. Hgb A1c Recent Labs    09/20/20 1238  HGBA1C 10.5*   Lipid Profile Recent Labs    09/21/20 0400  CHOL 139  HDL 25*  LDLCALC 61  TRIG 263*  CHOLHDL 5.6   Thyroid function studies No results for input(s): TSH, T4TOTAL, T3FREE, THYROIDAB in the last 72 hours.  Invalid input(s): FREET3 Anemia work up No results for input(s): VITAMINB12, FOLATE, FERRITIN, TIBC, IRON, RETICCTPCT in the last 72 hours. Urinalysis    Component Value Date/Time   COLORURINE YELLOW (A) 03/31/2020 1457   APPEARANCEUR HAZY (A) 03/31/2020 1457   LABSPEC 1.015 03/31/2020 1457   PHURINE 5.0 03/31/2020 1457   GLUCOSEU NEGATIVE  03/31/2020 1457   HGBUR NEGATIVE 03/31/2020 1457   [place] 03/31/2020 1457   KETONESUR NEGATIVE 03/31/2020 1457   PROTEINUR 100 (A) 03/31/2020 1457   NITRITE NEGATIVE 03/31/2020 1457   LEUKOCYTESUR SMALL (A) 03/31/2020 1457   Sepsis Labs Invalid input(s): PROCALCITONIN,  WBC,  LACTICIDVEN Microbiology Recent Results (from the past 240 hour(s))  Respiratory Panel by RT PCR (Flu A&B, Covid) - Nasopharyngeal Swab     Status: None   Collection Time: 09/20/20 12:38 PM   Specimen: Nasopharyngeal Swab  Result Value Ref Range Status   SARS Coronavirus 2 by RT PCR NEGATIVE NEGATIVE Final    Comment: (NOTE) SARS-CoV-2 target nucleic acids are NOT DETECTED.  The SARS-CoV-2 RNA is generally detectable in upper respiratoy specimens during the acute phase of infection. The lowest concentration of SARS-CoV-2 viral copies this assay can detect is 131 copies/mL. A negative result does not preclude SARS-Cov-2 infection and should not be used as the sole basis for treatment or other patient management decisions. A negative result may occur with  improper specimen collection/handling, submission of specimen other than nasopharyngeal swab, presence of viral mutation(s) within the areas targeted by this assay, and inadequate number of viral copies (<131 copies/mL). A negative result must be combined with clinical observations, patient history, and epidemiological information. The expected result is Negative.  Fact Sheet for Patients:  PinkCheek.be  Fact Sheet for Healthcare Providers:  GravelBags.it  This test is no t yet approved or cleared by the [place] FDA and  has been authorized for detection and/or diagnosis of SARS-CoV-2 by FDA under an Emergency Use Authorization (EUA). This EUA will remain  in effect (meaning this test can be used) for the duration of the COVID-19 declaration under Section 564(b)(1) of the Act,  21 U.S.C. section 360bbb-3(b)(1), unless the authorization is terminated or revoked sooner.     Influenza A by  PCR NEGATIVE NEGATIVE Final   Influenza B by PCR NEGATIVE NEGATIVE Final    Comment: (NOTE) The Xpert Xpress SARS-CoV-2/FLU/RSV assay is intended as an aid in  the diagnosis of influenza from Nasopharyngeal swab specimens and  should not be used as a sole basis for treatment. Nasal washings and  aspirates are unacceptable for Xpert Xpress SARS-CoV-2/FLU/RSV  testing.  Fact Sheet for Patients: PinkCheek.be  Fact Sheet for Healthcare Providers: GravelBags.it  This test is not yet approved or cleared by the [place] FDA and  has been authorized for detection and/or diagnosis of SARS-CoV-2 by  FDA under an Emergency Use Authorization (EUA). This EUA will remain  in effect (meaning this test can be used) for the duration of the  Covid-19 declaration under Section 564(b)(1) of the Act, 21  U.S.C. section 360bbb-3(b)(1), unless the authorization is  terminated or revoked. Performed at [facility], [address] [place], [place] [address]      Time coordinating discharge: Over 30 minutes  SIGNED:   Ezekiel Slocumb, DO [facility] 09/21/2020, 11:55 AM   If 7PM-7AM, please contact night-coverage [email or web address]

## 2020-09-21 NOTE — ED Notes (Signed)
Pt given phone to call family for ride for discharge.

## 2020-09-21 NOTE — ED Notes (Signed)
Medications administered per order, Heparin level collected by this RN. Pt remains in no distress, resting in bed watching TV. Denies further needs, call bell remains within reach. Pt awaiting lunch to arrive. Pt aware waiting for Echo at this time.

## 2020-09-21 NOTE — Consult Note (Signed)
[place] for Heparin Infusion Indication: chest pain/ACS  Allergies  Allergen Reactions  . Accupril [Quinapril Hcl] Other (See Comments)    Reaction: unknown    Patient Measurements: Height: 5\' 7"  (170.2 cm) Weight: 80 kg (176 lb 5.9 oz) IBW/kg (Calculated) : 61.6 Heparin Dosing Weight: 77.9 kg  Vital Signs: Temp: 98.2 F (36.8 C) (09/26 0900) Temp Source: Oral (09/26 0900) BP: 137/52 (09/26 1230) Pulse Rate: 69 (09/26 1230)  Labs: Recent Labs    09/20/20 0403 09/20/20 0636 09/20/20 1238 09/20/20 1647 09/20/20 1859 09/21/20 0400 09/21/20 1232  HGB 10.1*  --   --   --   --  9.0*  --   HCT 32.0*  --   --   --   --  28.1*  --   PLT 498*  --   --   --   --  425*  --   APTT  --   --   --   --  30  --   --   LABPROT  --   --   --   --  11.4  --   --   INR  --   --   --   --  0.9  --   --   HEPARINUNFRC  --   --   --   --   --  0.24* 0.39  CREATININE 1.94*  --   --   --   --   --   --   TROPONINIHS 21*   < > 574* 600*  --  383*  --    < > = values in this interval not displayed.    Estimated Creatinine Clearance: 25.6 mL/min (A) (by C-G formula based on SCr of 1.94 mg/dL (H)).   Medical History: Past Medical History:  Diagnosis Date  . Aortic valve stenosis   . Carotid bruit   . CHF (congestive heart failure) ([place])   . Chronic kidney disease   . Diabetes mellitus without complication ([place])   . GERD (gastroesophageal reflux disease)   . Heart murmur   . Hyperlipidemia   . Hypertension   . Pneumonia   . Vitamin D deficiency     Medications:  No anticoagulation prior to admission per chart review  Assessment: Patient is a 79 y/o F with medical history including three-vessel coronary artery disease, aortic stenosis, CHF who presented with chest pain and shortness of breath. Troponins 108 >> 574 >> 600. Pharmacy has been consulted to initiate heparin infusion for suspected ACS.   Baseline CBC with anemia (Hgb 10.1)  which appears c/w baseline. Platelets slightly above ULN. Baseline aPTT and PT-INR are pending.  9/26 1232 HL 0.39   Goal of Therapy:  Heparin level 0.3-0.7 units/ml Monitor platelets by anticoagulation protocol: Yes   Plan:  Heparin level is therapeutic. Will continue heparin rate at 1050 unit/hr. Recheck heparin level in 8 hours. CBC daily while on heparin.   Oswald Hillock, PharmD, BCPS 09/21/2020,1:00 PM

## 2020-09-21 NOTE — ED Notes (Signed)
NAD noted at time of D/C. D/C instructions reviewed with patient and daughter. Pt and daughter deny comments/concerns regarding D/C instructions at this time. Pt taken to lobby via wheelchair. Pt D/C into the care of her daughter at this time.

## 2020-09-21 NOTE — Hospital Course (Signed)
Brittney [name] is a 79 y.o. female with medical history significant of HTN, HLD, DM, CAD s/p cath on 01/16/2019 which showed multivessel disease for which she was treated medically with plan for consideration of either high risk PCI or CABG if medical management fails, dCHF (Echo in 02/2020 EF 60-65%, grade 1 DD), AS, CKD-IIIb, GERD, who presented to the ED on 09/20/20 with substernal chest pain and shortness of breath.  Evaluation in the ED: Afebrile, BP 165/61, HR 87, RR 23, spO2 98% on 2 L/min oxygen. Labs showed WBC 13.0k, Cr 1.94 (prior 1.3-1.6 in April).  HS-Troponin 21>>108>>574.  BNP 147.8, K 3.2.  Negative respiratory viral panel including Covid-19 PCR.   Chest xray showed no active cardiopulmonary disease.  Admitted to hospitalist service for observation.  Cardiology, Dr. Humphrey Rolls, consulted.

## 2020-09-21 NOTE — ED Notes (Signed)
This RN to bedside, introduced self to patient. CBG obtained by this RN. VS obtained by this RN. Pt assisted to the bathroom at this time. Pt back to bed without incident. Pt denies further needs. Explained will return with medication. Pt states understanding. Placed back on monitor at this time.

## 2020-09-21 NOTE — ED Notes (Signed)
Medications administered as discussed with Dr. Humphrey Rolls. Pt sitting up in bed eating lunch. This RN discussed with patient pending discharge. This RN discussed with Dr. Arbutus Ped and Dr. Humphrey Rolls, per cardiology and hospitalist pt cleared for discharge with cardiology follow up and outpatient echo.

## 2020-09-21 NOTE — Consult Note (Signed)
[place] for Heparin Infusion Indication: chest pain/ACS  Allergies  Allergen Reactions  . Accupril [Quinapril Hcl] Other (See Comments)    Reaction: unknown    Patient Measurements: Height: 5\' 7"  (170.2 cm) Weight: 80 kg (176 lb 5.9 oz) IBW/kg (Calculated) : 61.6 Heparin Dosing Weight: 77.9 kg  Vital Signs: BP: 150/53 (09/26 0330) Pulse Rate: 61 (09/26 0330)  Labs: Recent Labs    09/20/20 0403 09/20/20 0403 09/20/20 0636 09/20/20 1238 09/20/20 1647 09/20/20 1859 09/21/20 0400  HGB 10.1*  --   --   --   --   --  9.0*  HCT 32.0*  --   --   --   --   --  28.1*  PLT 498*  --   --   --   --   --  425*  APTT  --   --   --   --   --  30  --   LABPROT  --   --   --   --   --  11.4  --   INR  --   --   --   --   --  0.9  --   HEPARINUNFRC  --   --   --   --   --   --  0.24*  CREATININE 1.94*  --   --   --   --   --   --   TROPONINIHS 21*   < > 108* 574* 600*  --   --    < > = values in this interval not displayed.    Estimated Creatinine Clearance: 25.6 mL/min (A) (by C-G formula based on SCr of 1.94 mg/dL (H)).   Medical History: Past Medical History:  Diagnosis Date  . Aortic valve stenosis   . Carotid bruit   . CHF (congestive heart failure) ([place])   . Chronic kidney disease   . Diabetes mellitus without complication ([place])   . GERD (gastroesophageal reflux disease)   . Heart murmur   . Hyperlipidemia   . Hypertension   . Pneumonia   . Vitamin D deficiency     Medications:  No anticoagulation prior to admission per chart review  Assessment: Patient is a 79 y/o F with medical history including three-vessel coronary artery disease, aortic stenosis, CHF who presented with chest pain and shortness of breath. Troponins 108 >> 574 >> 600. Pharmacy has been consulted to initiate heparin infusion for suspected ACS.   Baseline CBC with anemia (Hgb 10.1) which appears c/w baseline. Platelets slightly above ULN. Baseline aPTT  and PT-INR are pending.  Patient has been on standard DVT prophylaxis with SQ heparin 5000 unit q8h (last dose 9/25 at 1600). Will give 1/2 bolus given close proximity of last SQ heparin dose.  Goal of Therapy:  Heparin level 0.3-0.7 units/ml Monitor platelets by anticoagulation protocol: Yes   Plan:  --Heparin 2000 unit IV bolus x 1 followed by continuous infusion at 900 units/hr --Heparin level 8 hours after initiation of infusion --Daily CBC per protocol  9/26:  HL @ 0400 = 0.24 Will order Heparin 1150 units IV X 1 bolus and increase drip rate to 1050 units/hr. Will recheck HL 8 hrs after rate change.   Sahar Ryback D 09/21/2020,4:32 AM

## 2020-10-07 ENCOUNTER — Other Ambulatory Visit: Payer: Self-pay

## 2020-10-07 ENCOUNTER — Encounter: Payer: Self-pay | Admitting: Family

## 2020-10-07 ENCOUNTER — Ambulatory Visit: Payer: Medicare HMO | Attending: Family | Admitting: Family

## 2020-10-07 ENCOUNTER — Telehealth: Payer: Self-pay | Admitting: Family

## 2020-10-07 VITALS — BP 126/55 | HR 70 | Resp 16 | Ht 62.0 in | Wt 173.5 lb

## 2020-10-07 DIAGNOSIS — K219 Gastro-esophageal reflux disease without esophagitis: Secondary | ICD-10-CM | POA: Insufficient documentation

## 2020-10-07 DIAGNOSIS — Z7902 Long term (current) use of antithrombotics/antiplatelets: Secondary | ICD-10-CM | POA: Insufficient documentation

## 2020-10-07 DIAGNOSIS — Z79899 Other long term (current) drug therapy: Secondary | ICD-10-CM | POA: Diagnosis not present

## 2020-10-07 DIAGNOSIS — N189 Chronic kidney disease, unspecified: Secondary | ICD-10-CM | POA: Diagnosis not present

## 2020-10-07 DIAGNOSIS — E1122 Type 2 diabetes mellitus with diabetic chronic kidney disease: Secondary | ICD-10-CM | POA: Insufficient documentation

## 2020-10-07 DIAGNOSIS — I10 Essential (primary) hypertension: Secondary | ICD-10-CM

## 2020-10-07 DIAGNOSIS — I5032 Chronic diastolic (congestive) heart failure: Secondary | ICD-10-CM | POA: Insufficient documentation

## 2020-10-07 DIAGNOSIS — Z87891 Personal history of nicotine dependence: Secondary | ICD-10-CM | POA: Insufficient documentation

## 2020-10-07 DIAGNOSIS — Z794 Long term (current) use of insulin: Secondary | ICD-10-CM | POA: Insufficient documentation

## 2020-10-07 DIAGNOSIS — I130 Hypertensive heart and chronic kidney disease with heart failure and stage 1 through stage 4 chronic kidney disease, or unspecified chronic kidney disease: Secondary | ICD-10-CM | POA: Diagnosis not present

## 2020-10-07 DIAGNOSIS — Z7982 Long term (current) use of aspirin: Secondary | ICD-10-CM | POA: Insufficient documentation

## 2020-10-07 DIAGNOSIS — E785 Hyperlipidemia, unspecified: Secondary | ICD-10-CM | POA: Diagnosis not present

## 2020-10-07 LAB — BASIC METABOLIC PANEL
Anion gap: 13 (ref 5–15)
BUN: 45 mg/dL — ABNORMAL HIGH (ref 8–23)
CO2: 25 mmol/L (ref 22–32)
Calcium: 9.4 mg/dL (ref 8.9–10.3)
Chloride: 95 mmol/L — ABNORMAL LOW (ref 98–111)
Creatinine, Ser: 2.99 mg/dL — ABNORMAL HIGH (ref 0.44–1.00)
GFR, Estimated: 14 mL/min — ABNORMAL LOW (ref >60)
Glucose, Bld: 345 mg/dL — ABNORMAL HIGH (ref 70–99)
Potassium: 5.2 mmol/L — ABNORMAL HIGH (ref 3.5–5.1)
Sodium: 133 mmol/L — ABNORMAL LOW (ref 135–145)

## 2020-10-07 MED ORDER — TORSEMIDE 20 MG PO TABS: ORAL_TABLET | ORAL | 5 refills | Status: DC

## 2020-10-07 NOTE — Progress Notes (Signed)
Patient ID: Brittney [name], female    DOB: [birth year], 79 y.o.   MRN: [id number]  HPI  Brittney [name] is a 79 y/o female with a history of DM, hyperlipidemia, HTN, CKD, GERD, vitamin D deficiency, previous tobacco use and chronic heart failure.   Patient reports she has a stress test scheduled for November 1st. Echo report from 03/19/20 reviewed and showed an EF of 60-65% along with mildly elevated PA pressure and mild MR. Echo report from 01/02/2019 reviewed and showed an EF of 45-50%.  Catheterization done 01/23/2019: EF >65%  Mid RCA lesion is 99% stenosed.  Prox RCA to Mid RCA lesion is 25% stenosed.  Prox LAD lesion is 70% stenosed.  Mid LAD lesion is 70% stenosed.  Ost Cx to Mid Cx lesion is 15% stenosed.  The left ventricular systolic function is normal.  LV end diastolic pressure is normal.  The left ventricular ejection fraction is greater than 65% by visual estimate.  Patient seen in ED for shortness of breath 09/20/2020 and admitted for observation. Plavix 75mg  daily added and metoprolol 100mg  daily. Elevated troponins noted, patient advised for cardiology follow up.  Patient D/C 09/21/20. Admitted 03/31/20 due to abdominal pain due to duodenitis. GI consult obtained. Pelvic and abdominal CT obtained. EGD done. IV protonix given. Given IVF's due to AKI. Discharged after 4 days.   She presents today for a follow-up visit with a chief complaint of minimal shortness of breath upon moderate exertion. She reports she has been having swelling to her legs in the evenings and has not been tracking weights daily. Presents with granddaughter as a support person.  Past Medical History:  Diagnosis Date  . Aortic valve stenosis   . Carotid bruit   . CHF (congestive heart failure) ([place])   . Chronic kidney disease   . Diabetes mellitus without complication ([place])   . GERD (gastroesophageal reflux disease)   . Heart murmur   . Hyperlipidemia   . Hypertension   . Pneumonia   . Vitamin D  deficiency    Past Surgical History:  Procedure Laterality Date  . COLONOSCOPY    . COLONOSCOPY WITH PROPOFOL N/A 08/02/2019   Procedure: COLONOSCOPY WITH PROPOFOL;  Surgeon: Virgel Manifold, MD;  Location: [facility] ENDOSCOPY;  Service: Endoscopy;  Laterality: N/A;  . CORONARY ANGIOPLASTY    . ESOPHAGOGASTRODUODENOSCOPY N/A 06/26/2019   Procedure: ESOPHAGOGASTRODUODENOSCOPY (EGD);  Surgeon: Virgel Manifold, MD;  Location: [facility] ENDOSCOPY;  Service: Endoscopy;  Laterality: N/A;  . ESOPHAGOGASTRODUODENOSCOPY (EGD) WITH PROPOFOL N/A 08/02/2019   Procedure: ESOPHAGOGASTRODUODENOSCOPY (EGD) WITH PROPOFOL;  Surgeon: Virgel Manifold, MD;  Location: [facility] ENDOSCOPY;  Service: Endoscopy;  Laterality: N/A;  . ESOPHAGOGASTRODUODENOSCOPY (EGD) WITH PROPOFOL N/A 04/03/2020   Procedure: ESOPHAGOGASTRODUODENOSCOPY (EGD) WITH PROPOFOL;  Surgeon: [name], MD;  Location: [facility] ENDOSCOPY;  Service: Gastroenterology;  Laterality: N/A;  . LEFT HEART CATH AND CORONARY ANGIOGRAPHY N/A 01/23/2019   Procedure: LEFT HEART CATH AND CORONARY ANGIOGRAPHY;  Surgeon: Yolonda Kida, MD;  Location: [facility];  Service: Cardiovascular;  Laterality: N/A;  . ovarian cyst removal     Family History  Problem Relation Age of Onset  . Diabetes Neg Hx   . Hypertension Neg Hx   . Breast cancer Neg Hx    Social History   Tobacco Use  . Smoking status: Former Smoker    Packs/day: 1.00    Years: 40.00    Pack years: 40.00    Types: Cigarettes    Quit date:  07/27/2018    Years since quitting: 2.2  . Smokeless tobacco: Never Used  Substance Use Topics  . Alcohol use: No   Allergies  Allergen Reactions  . Accupril [Quinapril Hcl] Other (See Comments)    Reaction: unknown   Prior to Admission medications   Medication Sig Start Date End Date Taking? Authorizing Provider  albuterol (VENTOLIN HFA) 108 (90 Base) MCG/ACT inhaler Inhale 2 puffs into the lungs every 6 (six) hours as needed for wheezing.   Yes  [provider]  aspirin EC 81 MG EC tablet Take 1 tablet (81 mg total) by mouth daily. 06/27/19  Yes Gouru, Illene Silver, MD  atorvastatin (LIPITOR) 40 MG tablet Take 40 mg by mouth daily.  07/04/18  Yes [provider]  cetirizine (ZYRTEC) 10 MG tablet Take 10 mg by mouth every other day.    Yes [provider]  Choline Fenofibrate 45 MG capsule Take 45 mg by mouth daily.    Yes [provider]  clopidogrel (PLAVIX) 75 MG tablet Take 1 tablet (75 mg total) by mouth daily. 09/22/20  Yes Nicole Kindred A, DO  glipiZIDE (GLUCOTROL XL) 10 MG 24 hr tablet Take 10 mg by mouth 2 (two) times daily.   Yes [provider]  hydrALAZINE (APRESOLINE) 25 MG tablet Take 25 mg by mouth 2 (two) times daily. 08/25/20  Yes [provider]  isosorbide mononitrate (IMDUR) 30 MG 24 hr tablet Take 30 mg by mouth 3 (three) times daily.    Yes [provider]  LANTUS SOLOSTAR 100 UNIT/ML Solostar Pen Inject 16 Units into the skin at bedtime. 10/19/19  Yes [provider]  losartan (COZAAR) 25 MG tablet Take 1 tablet (25 mg total) by mouth daily. 09/21/20  Yes Nicole Kindred A, DO  metoprolol succinate (TOPROL-XL) 100 MG 24 hr tablet Take 1 tablet (100 mg total) by mouth daily. Take with or immediately following a meal. 09/22/20  Yes Nicole Kindred A, DO  Multiple Vitamin (MULTIVITAMIN WITH MINERALS) TABS tablet Take 1 tablet by mouth daily.   Yes [provider]  nitroGLYCERIN (NITROSTAT) 0.4 MG SL tablet Place 1 tablet (0.4 mg total) under the tongue every 5 (five) minutes as needed for chest pain. 03/20/20  Yes Nicole Kindred A, DO  pantoprazole (PROTONIX) 40 MG tablet Take 40 mg by mouth daily. 08/25/20  Yes [provider]  rosuvastatin (CRESTOR) 40 MG tablet Take 40 mg by mouth daily.   Yes [provider]  saccharomyces boulardii (FLORASTOR) 250 MG capsule Take 250 mg by mouth 2 (two) times daily.   Yes [provider]   sitaGLIPtin (JANUVIA) 100 MG tablet Take 100 mg by mouth daily.   Yes [provider]  spironolactone (ALDACTONE) 25 MG tablet Take 1 tablet (25 mg total) by mouth daily. 09/21/20  Yes Ezekiel Slocumb, DO  STIOLTO RESPIMAT 2.5-2.5 MCG/ACT AERS SMARTSIG:2 Puff(s) Via Inhaler Daily 08/15/20  Yes [provider]    Review of Systems  Constitutional: Positive for fatigue. Negative for appetite change.  HENT: Negative for congestion, postnasal drip and sore throat.   Eyes: Negative.   Respiratory: Positive for chest tightness and shortness of breath. Negative for cough.   Cardiovascular: Negative for chest pain, palpitations and leg swelling.  Gastrointestinal: Positive for nausea (Patient reports she had nausea recently ). Negative for abdominal distention and abdominal pain.  Endocrine: Negative.   Genitourinary: Negative.   Musculoskeletal: Negative for back pain.  Skin: Negative.  Negative for color change  and pallor.  Allergic/Immunologic: Negative.   Neurological: Negative for dizziness, weakness and light-headedness.  Hematological: Negative for adenopathy. Does not bruise/bleed easily.  Psychiatric/Behavioral: Negative for dysphoric mood and sleep disturbance (sleeping on 1 pillow). The patient is not nervous/anxious.    Vitals:   10/07/20 0946  BP: (!) 126/55  Pulse: 70  Resp: 16  SpO2: 98%  Weight: 173 lb 8 oz (78.7 kg)  Height: 5\' 2"  (1.575 m)   Wt Readings from Last 3 Encounters:  10/07/20 173 lb 8 oz (78.7 kg)  09/20/20 176 lb 5.9 oz (80 kg)  08/14/20 177 lb 3.2 oz (80.4 kg)   Lab Results  Component Value Date   CREATININE 1.94 (H) 09/20/2020   CREATININE 1.65 (H) 04/04/2020   CREATININE 2.20 (H) 04/03/2020    Physical Exam Vitals and nursing note reviewed.  Constitutional:      Appearance: Normal appearance.  HENT:     Head: Normocephalic and atraumatic.  Cardiovascular:     Rate and Rhythm: Normal rate and regular rhythm.  Pulmonary:      Effort: Pulmonary effort is normal.     Breath sounds: Rales (Fine crackles heard in bilateral lung bases) present. No wheezing.  Abdominal:     General: There is no distension.     Palpations: Abdomen is soft.  Musculoskeletal:        General: No tenderness.     Cervical back: Normal range of motion and neck supple.     Right lower leg: No edema.     Left lower leg: No edema.  Skin:    General: Skin is warm and dry.  Neurological:     General: No focal deficit present.     Mental Status: She is alert and oriented to person, place, and time.  Psychiatric:        Mood and Affect: Mood normal.        Behavior: Behavior normal.        Thought Content: Thought content normal.   Assessment & Plan:  1: Chronic heart failure with preserved ejection fraction- - NYHA class II - euvolemic today - Has not beenweighing daily, re-education provided on importance of weighing daily; reminded to call for an overnight weight gain of >2 pounds or a weekly weight gain of >5 pounds - weight down 3 lbs from last visit - not adding salt to her food and continues to follow a low sodium diet, given low sodium cookbook to granddaughter to assist in meal ideas. - saw cardiology Clayborn Bigness) 10/02/20 to discuss elevated troponins - BNP 09/20/20 was 147.8  2: HTN- - BP looks good today - follows with PCP Frederico Hamman) at [place] returns in 2 days - BMP from 09/20/20 reviewed and showed sodium 139, potassium 3.2, creatinine 1.94 and GFR 28 - Patient to see Nephrology this month for followup  3: Diabetes- - A1c 09/20/20 was 10.5%    Medication list reviewed with family. Advised to bring medication bottles to next visit.   Patient sent to have BMP rechecked due to low K+ last check.   Tourosemide 40mg  in the morning and 20mg  in the afternoon started and Lasix discontinued at today's visit. Weight chart and low sodium cookbook provided to patient and family.   Return in 3 months or sooner for any  questions/problems before then.

## 2020-10-07 NOTE — Telephone Encounter (Signed)
Spoke with patient and granddaughter, Brittney [name], regarding BMP drawn earlier today. Potassium level has risen and renal function has worsened quite a bit.   On 09/20/20 when patient was in the ED overnight, spironolactone 25mg  daily and losartan 25mg  daily were both started. At that time, potassium was 3.2, creatinine was 1.94, GFR was 16 with elevated glucose of 313.   Today's lab showed potassium of 5.2, creatinine 2.99, GFR 13 and glucose of 345. Switched her diuretic today from furosemide to torsemide. Messaged nephrology [facility]) as well.   Will stop her spironolactone today and repeat BMP in 1 week prior to her nephrology appointment on 10/17/20. Also discussed the importance of getting her glucose levels under control.   Granddaughter and patient verbalized understanding. Appointment was made with [place] on 10/14/20

## 2020-10-07 NOTE — Patient Instructions (Addendum)
Switching from Lasix to Tourosemide: Take 40mg  in morning and 20mg  in the evening.   Weight Chart given today  If you gain over 2lbs overnight or 5lbs in a week call the [facility]  6-7 cups of fluid per day goal

## 2020-10-14 ENCOUNTER — Telehealth: Payer: Self-pay | Admitting: Family

## 2020-10-14 ENCOUNTER — Encounter: Payer: Self-pay | Admitting: Family

## 2020-10-14 ENCOUNTER — Ambulatory Visit: Payer: Medicare HMO | Attending: Family | Admitting: Family

## 2020-10-14 ENCOUNTER — Other Ambulatory Visit: Payer: Self-pay

## 2020-10-14 VITALS — BP 148/52 | HR 79 | Resp 18 | Ht 62.0 in | Wt 174.4 lb

## 2020-10-14 DIAGNOSIS — N184 Chronic kidney disease, stage 4 (severe): Secondary | ICD-10-CM

## 2020-10-14 DIAGNOSIS — Z87891 Personal history of nicotine dependence: Secondary | ICD-10-CM | POA: Diagnosis not present

## 2020-10-14 DIAGNOSIS — E1165 Type 2 diabetes mellitus with hyperglycemia: Secondary | ICD-10-CM | POA: Insufficient documentation

## 2020-10-14 DIAGNOSIS — Z794 Long term (current) use of insulin: Secondary | ICD-10-CM | POA: Diagnosis not present

## 2020-10-14 DIAGNOSIS — Z7984 Long term (current) use of oral hypoglycemic drugs: Secondary | ICD-10-CM | POA: Insufficient documentation

## 2020-10-14 DIAGNOSIS — I10 Essential (primary) hypertension: Secondary | ICD-10-CM

## 2020-10-14 DIAGNOSIS — Z713 Dietary counseling and surveillance: Secondary | ICD-10-CM | POA: Diagnosis not present

## 2020-10-14 DIAGNOSIS — Z8249 Family history of ischemic heart disease and other diseases of the circulatory system: Secondary | ICD-10-CM | POA: Diagnosis not present

## 2020-10-14 DIAGNOSIS — E1122 Type 2 diabetes mellitus with diabetic chronic kidney disease: Secondary | ICD-10-CM | POA: Insufficient documentation

## 2020-10-14 DIAGNOSIS — I130 Hypertensive heart and chronic kidney disease with heart failure and stage 1 through stage 4 chronic kidney disease, or unspecified chronic kidney disease: Secondary | ICD-10-CM | POA: Diagnosis not present

## 2020-10-14 DIAGNOSIS — Z6831 Body mass index (BMI) 31.0-31.9, adult: Secondary | ICD-10-CM | POA: Diagnosis not present

## 2020-10-14 DIAGNOSIS — I5032 Chronic diastolic (congestive) heart failure: Secondary | ICD-10-CM | POA: Insufficient documentation

## 2020-10-14 DIAGNOSIS — Z833 Family history of diabetes mellitus: Secondary | ICD-10-CM | POA: Insufficient documentation

## 2020-10-14 DIAGNOSIS — E785 Hyperlipidemia, unspecified: Secondary | ICD-10-CM | POA: Insufficient documentation

## 2020-10-14 DIAGNOSIS — K219 Gastro-esophageal reflux disease without esophagitis: Secondary | ICD-10-CM | POA: Diagnosis not present

## 2020-10-14 DIAGNOSIS — Z79899 Other long term (current) drug therapy: Secondary | ICD-10-CM | POA: Insufficient documentation

## 2020-10-14 DIAGNOSIS — Z7982 Long term (current) use of aspirin: Secondary | ICD-10-CM | POA: Insufficient documentation

## 2020-10-14 DIAGNOSIS — N189 Chronic kidney disease, unspecified: Secondary | ICD-10-CM | POA: Insufficient documentation

## 2020-10-14 LAB — BASIC METABOLIC PANEL
Anion gap: 13 (ref 5–15)
BUN: 35 mg/dL — ABNORMAL HIGH (ref 8–23)
CO2: 24 mmol/L (ref 22–32)
Calcium: 8.8 mg/dL — ABNORMAL LOW (ref 8.9–10.3)
Chloride: 98 mmol/L (ref 98–111)
Creatinine, Ser: 2.83 mg/dL — ABNORMAL HIGH (ref 0.44–1.00)
GFR, Estimated: 15 mL/min — ABNORMAL LOW (ref >60)
Glucose, Bld: 398 mg/dL — ABNORMAL HIGH (ref 70–99)
Potassium: 3.9 mmol/L (ref 3.5–5.1)
Sodium: 135 mmol/L (ref 135–145)

## 2020-10-14 NOTE — Patient Instructions (Addendum)
DASH Eating Plan DASH stands for "Dietary Approaches to Stop Hypertension." The DASH eating plan is a healthy eating plan that has been shown to reduce high blood pressure (hypertension). It may also reduce your risk for type 2 diabetes, heart disease, and stroke. The DASH eating plan may also help with weight loss. What are tips for following this plan?  General guidelines  Avoid eating more than 2,300 mg (milligrams) of salt (sodium) a day. If you have hypertension, you may need to reduce your sodium intake to 1,500 mg a day.  Limit alcohol intake to no more than 1 drink a day for nonpregnant women and 2 drinks a day for men. One drink equals 12 oz of beer, 5 oz of wine, or 1 oz of hard liquor.  Work with your health care provider to maintain a healthy body weight or to lose weight. Ask what an ideal weight is for you.  Get at least 30 minutes of exercise that causes your heart to beat faster (aerobic exercise) most days of the week. Activities may include walking, swimming, or biking.  Work with your health care provider or diet and nutrition specialist (dietitian) to adjust your eating plan to your individual calorie needs. Reading food labels   Check food labels for the amount of sodium per serving. Choose foods with less than 5 percent of the Daily Value of sodium. Generally, foods with less than 300 mg of sodium per serving fit into this eating plan.  To find whole grains, look for the word "whole" as the first word in the ingredient list. Shopping  Buy products labeled as "low-sodium" or "no salt added."  Buy fresh foods. Avoid canned foods and premade or frozen meals. Cooking  Avoid adding salt when cooking. Use salt-free seasonings or herbs instead of table salt or sea salt. Check with your health care provider or pharmacist before using salt substitutes.  Do not fry foods. Cook foods using healthy methods such as baking, boiling, grilling, and broiling instead.  Cook with  heart-healthy oils, such as olive, canola, soybean, or sunflower oil. Meal planning  Eat a balanced diet that includes: ? 5 or more servings of fruits and vegetables each day. At each meal, try to fill half of your plate with fruits and vegetables. ? Up to 6-8 servings of whole grains each day. ? Less than 6 oz of lean meat, poultry, or fish each day. A 3-oz serving of meat is about the same size as a deck of cards. One egg equals 1 oz. ? 2 servings of low-fat dairy each day. ? A serving of nuts, seeds, or beans 5 times each week. ? Heart-healthy fats. Healthy fats called Omega-3 fatty acids are found in foods such as flaxseeds and coldwater fish, like sardines, salmon, and mackerel.  Limit how much you eat of the following: ? Canned or prepackaged foods. ? Food that is high in trans fat, such as fried foods. ? Food that is high in saturated fat, such as fatty meat. ? Sweets, desserts, sugary drinks, and other foods with added sugar. ? Full-fat dairy products.  Do not salt foods before eating.  Try to eat at least 2 vegetarian meals each week.  Eat more home-cooked food and less restaurant, buffet, and fast food.  When eating at a restaurant, ask that your food be prepared with less salt or no salt, if possible. What foods are recommended? The items listed may not be a complete list. Talk with your dietitian about   what dietary choices are best for you. Grains Whole-grain or whole-wheat bread. Whole-grain or whole-wheat pasta. Brown rice. Oatmeal. Quinoa. Bulgur. Whole-grain and low-sodium cereals. Pita bread. Low-fat, low-sodium crackers. Whole-wheat flour tortillas. Vegetables Fresh or frozen vegetables (raw, steamed, roasted, or grilled). Low-sodium or reduced-sodium tomato and vegetable juice. Low-sodium or reduced-sodium tomato sauce and tomato paste. Low-sodium or reduced-sodium canned vegetables. Fruits All fresh, dried, or frozen fruit. Canned fruit in natural juice (without  added sugar). Meat and other protein foods Skinless chicken or turkey. Ground chicken or turkey. Pork with fat trimmed off. Fish and seafood. Egg whites. Dried beans, peas, or lentils. Unsalted nuts, nut butters, and seeds. Unsalted canned beans. Lean cuts of beef with fat trimmed off. Low-sodium, lean deli meat. Dairy Low-fat (1%) or fat-free (skim) milk. Fat-free, low-fat, or reduced-fat cheeses. Nonfat, low-sodium ricotta or cottage cheese. Low-fat or nonfat yogurt. Low-fat, low-sodium cheese. Fats and oils Soft margarine without trans fats. Vegetable oil. Low-fat, reduced-fat, or light mayonnaise and salad dressings (reduced-sodium). Canola, safflower, olive, soybean, and sunflower oils. Avocado. Seasoning and other foods Herbs. Spices. Seasoning mixes without salt. Unsalted popcorn and pretzels. Fat-free sweets. What foods are not recommended? The items listed may not be a complete list. Talk with your dietitian about what dietary choices are best for you. Grains Baked goods made with fat, such as croissants, muffins, or some breads. Dry pasta or rice meal packs. Vegetables Creamed or fried vegetables. Vegetables in a cheese sauce. Regular canned vegetables (not low-sodium or reduced-sodium). Regular canned tomato sauce and paste (not low-sodium or reduced-sodium). Regular tomato and vegetable juice (not low-sodium or reduced-sodium). Pickles. Olives. Fruits Canned fruit in a light or heavy syrup. Fried fruit. Fruit in cream or butter sauce. Meat and other protein foods Fatty cuts of meat. Ribs. Fried meat. Bacon. Sausage. Bologna and other processed lunch meats. Salami. Fatback. Hotdogs. Bratwurst. Salted nuts and seeds. Canned beans with added salt. Canned or smoked fish. Whole eggs or egg yolks. Chicken or turkey with skin. Dairy Whole or 2% milk, cream, and half-and-half. Whole or full-fat cream cheese. Whole-fat or sweetened yogurt. Full-fat cheese. Nondairy creamers. Whipped toppings.  Processed cheese and cheese spreads. Fats and oils Butter. Stick margarine. Lard. Shortening. Ghee. Bacon fat. Tropical oils, such as coconut, palm kernel, or palm oil. Seasoning and other foods Salted popcorn and pretzels. Onion salt, garlic salt, seasoned salt, table salt, and sea salt. Worcestershire sauce. Tartar sauce. Barbecue sauce. Teriyaki sauce. Soy sauce, including reduced-sodium. Steak sauce. Canned and packaged gravies. Fish sauce. Oyster sauce. Cocktail sauce. Horseradish that you find on the shelf. Ketchup. Mustard. Meat flavorings and tenderizers. Bouillon cubes. Hot sauce and Tabasco sauce. Premade or packaged marinades. Premade or packaged taco seasonings. Relishes. Regular salad dressings. Where to find more information:  National Heart, Lung, and Blood Institute: www.nhlbi.nih.gov  American Heart Association: www.heart.org Summary  The DASH eating plan is a healthy eating plan that has been shown to reduce high blood pressure (hypertension). It may also reduce your risk for type 2 diabetes, heart disease, and stroke.  With the DASH eating plan, you should limit salt (sodium) intake to 2,300 mg a day. If you have hypertension, you may need to reduce your sodium intake to 1,500 mg a day.  When on the DASH eating plan, aim to eat more fresh fruits and vegetables, whole grains, lean proteins, low-fat dairy, and heart-healthy fats.  Work with your health care provider or diet and nutrition specialist (dietitian) to adjust your eating plan to your   individual calorie needs. This information is not intended to replace advice given to you by your health care provider. Make sure you discuss any questions you have with your health care provider. Document Revised: 11/25/2017 Document Reviewed: 12/06/2016 Elsevier Patient Education  [place]. Glomerular Filtration Rate Test Why am I having this test? Glomerular filtration rate (GFR) is a measurement of how well your kidneys  are functioning. You may have this test to diagnose or assess the severity (stage) of kidney disease. This test is sometimes also called a calculated, or estimated, glomerular filtration rate (eGFR). What is being tested? Your GFR is estimated or calculated based on your:  Creatinine level in your blood. Creatinine is a waste product from your muscles. Healthy kidneys filter creatinine out of your blood.  Age.  Gender.  Race.  Height.  Weight. What kind of sample is taken?  A blood sample is required for this test. It is usually collected by inserting a needle into a blood vessel. Tell a health care provider about:  All medicines you are taking, including vitamins, herbs, eye drops, creams, and over-the-counter medicines.  Whether you are pregnant or may be pregnant. How are the results reported? Your results will be reported as milliliters of creatinine per minute (mL/min). Your health care provider will compare your results to a normal range that was established after testing a large group of people (reference range). Reference ranges may vary among labs and hospitals. For this test, a common reference range is 90-120 mL/min. What do the results mean? A result within the reference range is considered normal, meaning that your kidneys are filtering creatinine from your blood normally. A GFR below 60 mL/min for 3 months or longer is a sign of long-term (chronic) kidney disease. You may also have other tests to help your health care provider diagnose kidney disease. These tests may include:  Urine tests.  Removal and examination of a piece of kidney tissue (biopsy).  Imaging tests such as an ultrasound, X-ray, or MRI. If you are diagnosed with kidney disease, your GFR can also indicate your stage of kidney disease:  GFR of 60-89 mL/min means mild kidney disease (stage 2).  GFR of 30-59 mL/min indicates moderate kidney disease (stage 3).  GFR of 15-29 mL/min indicates severe  kidney disease (stage 4).  GFR of less than 15 mL/min indicates kidney failure (stage 5). Talk with your health care provider about what your results mean. Questions to ask your health care provider Ask your health care provider, or the department that is doing the test:  When will my results be ready?  How will I get my results?  What are my treatment options?  What other tests do I need?  What are my next steps? Summary  Glomerular filtration rate (GFR) is a measurement of how well your kidneys are functioning. You may have this test to diagnose or assess the stage of kidney disease.  Your GFR is estimated or calculated based on the creatinine level in your blood and several other factors, such as age, gender, race, height, and weight.  A GFR below 60 for 3 months or longer is a sign of chronic kidney disease. You may also have other tests to help your health care provider diagnose kidney disease. This information is not intended to replace advice given to you by your health care provider. Make sure you discuss any questions you have with your health care provider. Document Revised: 12/28/2017 Document Reviewed: 08/03/2017 Elsevier Patient Education  2020 [employer]  Hyperglycemia Hyperglycemia occurs when the level of sugar (glucose) in the blood is too high. Glucose is a type of sugar that provides the body's main source of energy. Certain hormones (insulin and glucagon) control the level of glucose in the blood. Insulin lowers blood glucose, and glucagon increases blood glucose. Hyperglycemia can result from having too little insulin in the bloodstream, or from the body not responding normally to insulin. Hyperglycemia occurs most often in people who have diabetes (diabetes mellitus), but it can happen in people who do not have diabetes. It can develop quickly, and it can be life-threatening if it causes you to become severely dehydrated (diabetic ketoacidosis or hyperglycemic  hyperosmolar state). Severe hyperglycemia is a medical emergency. What are the causes? If you have diabetes, hyperglycemia may be caused by:  Diabetes medicine.  Medicines that increase blood glucose or affect your diabetes control.  Not eating enough, or not eating often enough.  Changes in physical activity level.  Being sick or having an infection. If you have prediabetes or undiagnosed diabetes:  Hyperglycemia may be caused by those conditions. If you do not have diabetes, hyperglycemia may be caused by:  Certain medicines, including steroid medicines, beta-blockers, epinephrine, and thiazide diuretics.  Stress.  Serious illness.  Surgery.  Diseases of the pancreas.  Infection. What increases the risk? Hyperglycemia is more likely to develop in people who have risk factors for diabetes, such as:  Having a family member with diabetes.  Having a gene for type 1 diabetes that is passed from parent to child (inherited).  Living in an area with cold weather conditions.  Exposure to certain viruses.  Certain conditions in which the body's disease-fighting (immune) system attacks itself (autoimmune disorders).  Being overweight or obese.  Having an inactive (sedentary) lifestyle.  Having been diagnosed with insulin resistance.  Having a history of prediabetes, gestational diabetes, or polycystic ovarian syndrome (PCOS).  Being of American-Indian, African-American, Hispanic/Latino, or Asian/Pacific Islander descent. What are the signs or symptoms? Hyperglycemia may not cause any symptoms. If you do have symptoms, they may include early warning signs, such as:  Increased thirst.  Hunger.  Feeling very tired.  Needing to urinate more often than usual.  Blurry vision. Other symptoms may develop if hyperglycemia gets worse, such as:  Dry mouth.  Loss of appetite.  Fruity-smelling breath.  Weakness.  Unexpected or rapid weight gain or weight  loss.  Tingling or numbness in the hands or feet.  Headache.  Skin that does not quickly return to normal after being lightly pinched and released (poor skin turgor).  Abdominal pain.  Cuts or bruises that are slow to heal. How is this diagnosed? Hyperglycemia is diagnosed with a blood test to measure your blood glucose level. This blood test is usually done while you are having symptoms. Your health care provider may also do a physical exam and review your medical history. You may have more tests to determine the cause of your hyperglycemia, such as:  A fasting blood glucose (FBG) test. You will not be allowed to eat (you will fast) for at least 8 hours before a blood sample is taken.  An A1c (hemoglobin A1c) blood test. This provides information about blood glucose control over the previous 2-3 months.  An oral glucose tolerance test (OGTT). This measures your blood glucose at two times: ? After fasting. This is your baseline blood glucose level. ? Two hours after drinking a beverage that contains glucose. How is this treated?  Treatment depends on the cause of your hyperglycemia. Treatment may include:  Taking medicine to regulate your blood glucose levels. If you take insulin or other diabetes medicines, your medicine or dosage may be adjusted.  Lifestyle changes, such as exercising more, eating healthier foods, or losing weight.  Treating an illness or infection, if this caused your hyperglycemia.  Checking your blood glucose more often.  Stopping or reducing steroid medicines, if these caused your hyperglycemia. If your hyperglycemia becomes severe and it results in hyperglycemic hyperosmolar state, you must be hospitalized and given IV fluids. Follow these instructions at home:  General instructions  Take over-the-counter and prescription medicines only as told by your health care provider.  Do not use any products that contain nicotine or tobacco, such as cigarettes  and e-cigarettes. If you need help quitting, ask your health care provider.  Limit alcohol intake to no more than 1 drink per day for nonpregnant women and 2 drinks per day for men. One drink equals 12 oz of beer, 5 oz of wine, or 1 oz of hard liquor.  Learn to manage stress. If you need help with this, ask your health care provider.  Keep all follow-up visits as told by your health care provider. This is important. Eating and drinking   Maintain a healthy weight.  Exercise regularly, as directed by your health care provider.  Stay hydrated, especially when you exercise, get sick, or spend time in hot temperatures.  Eat healthy foods, such as: ? Lean proteins. ? Complex carbohydrates. ? Fresh fruits and vegetables. ? Low-fat dairy products. ? Healthy fats.  Drink enough fluid to keep your urine clear or pale yellow. If you have diabetes:  Make sure you know the symptoms of hyperglycemia.  Follow your diabetes management plan, as told by your health care provider. Make sure you: ? Take your insulin and medicines as directed. ? Follow your exercise plan. ? Follow your meal plan. Eat on time, and do not skip meals. ? Check your blood glucose as often as directed. Make sure to check your blood glucose before and after exercise. If you exercise longer or in a different way than usual, check your blood glucose more often. ? Follow your sick day plan whenever you cannot eat or drink normally. Make this plan in advance with your health care provider.  Share your diabetes management plan with people in your workplace, school, and household.  Check your urine for ketones when you are ill and as told by your health care provider.  Carry a medical alert card or wear medical alert jewelry. Contact a health care provider if:  Your blood glucose is at or above 240 mg/dL (13.3 mmol/L) for 2 days in a row.  You have problems keeping your blood glucose in your target range.  You have  frequent episodes of hyperglycemia. Get help right away if:  You have difficulty breathing.  You have a change in how you think, feel, or act (mental status).  You have nausea or vomiting that does not go away. These symptoms may represent a serious problem that is an emergency. Do not wait to see if the symptoms will go away. Get medical help right away. Call your local emergency services (911 in the [place] Do not drive yourself to the hospital. Summary  Hyperglycemia occurs when the level of sugar (glucose) in the blood is too high.  Hyperglycemia is diagnosed with a blood test to measure your blood glucose level. This blood test is  usually done while you are having symptoms. Your health care provider may also do a physical exam and review your medical history.  If you have diabetes, follow your diabetes management plan as told by your health care provider.  Contact your health care provider if you have problems keeping your blood glucose in your target range. This information is not intended to replace advice given to you by your health care provider. Make sure you discuss any questions you have with your health care provider. Document Revised: 08/30/2016 Document Reviewed: 08/30/2016 Elsevier Patient Education  [place].

## 2020-10-14 NOTE — Progress Notes (Signed)
Patient ID: Brittney [name], female    DOB: Aug 09, 1941, 79 y.o.   MRN: [id number]  Congestive Heart Failure Associated symptoms include fatigue and shortness of breath. Pertinent negatives include no abdominal pain, chest pain or palpitations.    Brittney [name] is a 79 y/o female with a history of DM, hyperlipidemia, HTN, CKD, GERD, vitamin D deficiency, previous tobacco use and chronic heart failure.   Patient reports she has a stress test scheduled for November 1st. Echo report from 03/19/20 reviewed and showed an EF of 60-65% along with mildly elevated PA pressure and mild MR. Echo report from 01/02/2019 reviewed and showed an EF of 45-50%.  Catheterization done 01/23/2019: EF >65%  Mid RCA lesion is 99% stenosed.  Prox RCA to Mid RCA lesion is 25% stenosed.  Prox LAD lesion is 70% stenosed.  Mid LAD lesion is 70% stenosed.  Ost Cx to Mid Cx lesion is 15% stenosed.  The left ventricular systolic function is normal.  LV end diastolic pressure is normal.  The left ventricular ejection fraction is greater than 65% by visual estimate.  Patient seen in ED for shortness of breath 09/20/2020 and admitted for observation. Plavix 75mg  daily added and metoprolol 100mg  daily. Elevated troponins noted, patient advised for cardiology follow up.  Patient D/C 09/21/20. Admitted 03/31/20 due to abdominal pain due to duodenitis. GI consult obtained. Pelvic and abdominal CT obtained. EGD done. IV protonix given. Given IVF's due to AKI. Discharged after 4 days.   She presents today for a follow-up visit with a chief complaint of minimal shortness of breath upon moderate exertion. Reports this as chronic in nature. Patient has also been having elevated sugar levels.   Past Medical History:  Diagnosis Date  . Aortic valve stenosis   . Carotid bruit   . CHF (congestive heart failure) ([place])   . Chronic kidney disease   . Diabetes mellitus without complication ([place])   . GERD (gastroesophageal reflux  disease)   . Heart murmur   . Hyperlipidemia   . Hypertension   . Pneumonia   . Vitamin D deficiency    Past Surgical History:  Procedure Laterality Date  . COLONOSCOPY    . COLONOSCOPY WITH PROPOFOL N/A 08/02/2019   Procedure: COLONOSCOPY WITH PROPOFOL;  Surgeon: Virgel Manifold, MD;  Location: [facility] ENDOSCOPY;  Service: Endoscopy;  Laterality: N/A;  . CORONARY ANGIOPLASTY    . ESOPHAGOGASTRODUODENOSCOPY N/A 06/26/2019   Procedure: ESOPHAGOGASTRODUODENOSCOPY (EGD);  Surgeon: Virgel Manifold, MD;  Location: [facility] ENDOSCOPY;  Service: Endoscopy;  Laterality: N/A;  . ESOPHAGOGASTRODUODENOSCOPY (EGD) WITH PROPOFOL N/A 08/02/2019   Procedure: ESOPHAGOGASTRODUODENOSCOPY (EGD) WITH PROPOFOL;  Surgeon: Virgel Manifold, MD;  Location: [facility] ENDOSCOPY;  Service: Endoscopy;  Laterality: N/A;  . ESOPHAGOGASTRODUODENOSCOPY (EGD) WITH PROPOFOL N/A 04/03/2020   Procedure: ESOPHAGOGASTRODUODENOSCOPY (EGD) WITH PROPOFOL;  Surgeon: [name], MD;  Location: [facility] ENDOSCOPY;  Service: Gastroenterology;  Laterality: N/A;  . LEFT HEART CATH AND CORONARY ANGIOGRAPHY N/A 01/23/2019   Procedure: LEFT HEART CATH AND CORONARY ANGIOGRAPHY;  Surgeon: Yolonda Kida, MD;  Location: [place] [facility] LAB;  Service: Cardiovascular;  Laterality: N/A;  . ovarian cyst removal     Family History  Problem Relation Age of Onset  . Diabetes Neg Hx   . Hypertension Neg Hx   . Breast cancer Neg Hx    Social History   Tobacco Use  . Smoking status: Former Smoker    Packs/day: 1.00    Years: 40.00    Pack years: 40.00  Types: Cigarettes    Quit date: 07/27/2018    Years since quitting: 2.2  . Smokeless tobacco: Never Used  Substance Use Topics  . Alcohol use: No   Allergies  Allergen Reactions  . Accupril [Quinapril Hcl] Other (See Comments)    Reaction: unknown   Prior to Admission medications   Medication Sig Start Date End Date Taking? Authorizing Provider  albuterol (VENTOLIN HFA) 108 (90 Base)  MCG/ACT inhaler Inhale 2 puffs into the lungs every 6 (six) hours as needed for wheezing.   Yes [provider]  aspirin EC 81 MG EC tablet Take 1 tablet (81 mg total) by mouth daily. 06/27/19  Yes Gouru, Illene Silver, MD  atorvastatin (LIPITOR) 40 MG tablet Take 40 mg by mouth daily.  07/04/18  Yes [provider]  cetirizine (ZYRTEC) 10 MG tablet Take 10 mg by mouth every other day.    Yes [provider]  Choline Fenofibrate 45 MG capsule Take 45 mg by mouth daily.    Yes [provider]  clopidogrel (PLAVIX) 75 MG tablet Take 1 tablet (75 mg total) by mouth daily. 09/22/20  Yes Nicole Kindred A, DO  glipiZIDE (GLUCOTROL XL) 10 MG 24 hr tablet Take 10 mg by mouth 2 (two) times daily.   Yes [provider]  hydrALAZINE (APRESOLINE) 25 MG tablet Take 25 mg by mouth 2 (two) times daily. 08/25/20  Yes [provider]  isosorbide mononitrate (IMDUR) 30 MG 24 hr tablet Take 30 mg by mouth 3 (three) times daily.    Yes [provider]  LANTUS SOLOSTAR 100 UNIT/ML Solostar Pen Inject 16 Units into the skin at bedtime. 10/19/19  Yes [provider]  losartan (COZAAR) 25 MG tablet Take 1 tablet (25 mg total) by mouth daily. 09/21/20  Yes Nicole Kindred A, DO  metoprolol succinate (TOPROL-XL) 100 MG 24 hr tablet Take 1 tablet (100 mg total) by mouth daily. Take with or immediately following a meal. 09/22/20  Yes Nicole Kindred A, DO  Multiple Vitamin (MULTIVITAMIN WITH MINERALS) TABS tablet Take 1 tablet by mouth daily.   Yes [provider]  nitroGLYCERIN (NITROSTAT) 0.4 MG SL tablet Place 1 tablet (0.4 mg total) under the tongue every 5 (five) minutes as needed for chest pain. 03/20/20  Yes Nicole Kindred A, DO  pantoprazole (PROTONIX) 40 MG tablet Take 40 mg by mouth daily. 08/25/20  Yes [provider]  rosuvastatin (CRESTOR) 40 MG tablet Take 40 mg by mouth daily.   Yes [provider]  saccharomyces boulardii  (FLORASTOR) 250 MG capsule Take 250 mg by mouth 2 (two) times daily.   Yes [provider]  sitaGLIPtin (JANUVIA) 100 MG tablet Take 100 mg by mouth daily.   Yes [provider]  STIOLTO RESPIMAT 2.5-2.5 MCG/ACT AERS SMARTSIG:2 Puff(s) Via Inhaler Daily 08/15/20  Yes [provider]  torsemide (DEMADEX) 20 MG tablet Take 40mg  (2 tablets) in the AM and 20mg  (1 tablet) in the PM 10/07/20  Yes Alisa Graff, FNP     Review of Systems  Constitutional: Positive for fatigue. Negative for appetite change.  HENT: Negative for congestion, postnasal drip and sore throat.   Eyes: Negative.   Respiratory: Positive for shortness of breath. Negative for cough and chest tightness.   Cardiovascular: Negative for chest pain, palpitations and leg swelling.  Gastrointestinal: Positive for nausea (Patient reports she had nausea recently ). Negative for abdominal distention and abdominal pain.  Endocrine: Negative.   Genitourinary: Negative.  Musculoskeletal: Negative for back pain.  Skin: Negative.  Negative for color change and pallor.  Allergic/Immunologic: Negative.   Neurological: Negative for dizziness, weakness and light-headedness.  Hematological: Negative for adenopathy. Does not bruise/bleed easily.  Psychiatric/Behavioral: Negative for dysphoric mood and sleep disturbance (sleeping on 1 pillow). The patient is not nervous/anxious.    Vitals:   10/14/20 1006  BP: (!) 148/52  Pulse: 79  Resp: 18  SpO2: 99%  Weight: 174 lb 6 oz (79.1 kg)  Height: 5\' 2"  (1.575 m)   Wt Readings from Last 3 Encounters:  10/14/20 174 lb 6 oz (79.1 kg)  10/07/20 173 lb 8 oz (78.7 kg)  09/20/20 176 lb 5.9 oz (80 kg)   Lab Results  Component Value Date   CREATININE 2.99 (H) 10/07/2020   CREATININE 1.94 (H) 09/20/2020   CREATININE 1.65 (H) 04/04/2020    Physical Exam Vitals and nursing note reviewed.  Constitutional:      Appearance: Normal appearance.  HENT:     Head:  Normocephalic and atraumatic.  Cardiovascular:     Rate and Rhythm: Normal rate and regular rhythm.  Pulmonary:     Effort: Pulmonary effort is normal.     Breath sounds: No wheezing or rales.  Abdominal:     General: There is no distension.     Palpations: Abdomen is soft.  Musculoskeletal:        General: No tenderness.     Cervical back: Normal range of motion and neck supple.     Right lower leg: No edema.     Left lower leg: No edema.  Skin:    General: Skin is warm and dry.  Neurological:     General: No focal deficit present.     Mental Status: She is alert and oriented to person, place, and time.  Psychiatric:        Mood and Affect: Mood normal.        Behavior: Behavior normal.        Thought Content: Thought content normal.   Assessment & Plan:  1: Chronic heart failure with preserved ejection fraction- - NYHA class II - euvolemic today - Has been weighing daily and weight is stable per weight chart - saw cardiology (White) 10/02/20 to discuss elevated troponins - BNP 09/20/20 was 147.8  2: HTN- - BP mildly elevated today in office, discussed with granddaughter checking those at least 2-3 times weekly as well as reduction in salt will help with this. - BMP from 10/07/20 reviewed and showed sodium 133, potassium 5.2, creatinine 2.99 and GFR 14; spironolactone was stopped based on these results - Patient to see Nephrology on Friday - Will re-check BMP today so labs will be available for nephrology visit on Friday.   3: Diabetes- - A1c 09/20/20 was 10.5% -Patient has been checking her CBG at home daily with readings between 200-350 consistently.  -Extensive dietary education and recall provided. Patient reports that her diet is typically as follows:  Breakfast: Cup of Coffee and Instant Grits or Oatmeal Lunch/Dinners : Pack of Tuna, Tomato Sandwich, Creamed Corn, Canned Soups, Chicken Sandwiches, Chicken and Waffles with sugar free syrup Snacks: Chips, Hard Candy,  Bananas, Strawberries.  In depth discussion of lab values had with patient and granddaughter regarding consistently elevated blood sugar levels, high blood pressure, sugar and sodium intake. Patient agreeable to seeing a dietician at the [facility] to attempt to help with dietary needs. Explained how the body processes simple carbs (breads and other sugars)  and the stress this puts on the body due to chronically elevated blood sugar.   -Consult placed to lifestyle center for Sodium and Sugar diet counseling.   Medication list reviewed with family. Advised to bring medication bottles to next visit.     Return in 3 months or sooner for any questions/problems before then.

## 2020-10-14 NOTE — Telephone Encounter (Signed)
Agree with NP student's conversation with patient.

## 2020-10-14 NOTE — Telephone Encounter (Cosign Needed)
Called and spoke to patient's granddaughter regarding lab results. Patient's renal function noted to be slightly improved from 1 week ago but glucose noted to be higher than last time. Patient's granddaughter reports understanding of lab values. Patient's Sugar discussed at length with granddaughter and encouraged dietary changes. Patient endorses plans for doing more physical activity and walking daily. Discussion with granddaughter therapeutic in regards to plan of care and hopes of good improvement with [facility] intervention.

## 2020-10-23 ENCOUNTER — Encounter: Payer: Self-pay | Admitting: Family

## 2020-10-31 ENCOUNTER — Other Ambulatory Visit: Payer: Self-pay | Admitting: Family Medicine

## 2020-11-03 ENCOUNTER — Ambulatory Visit: Payer: Medicare HMO | Admitting: Dietician

## 2020-11-04 ENCOUNTER — Other Ambulatory Visit: Payer: Self-pay

## 2020-11-04 ENCOUNTER — Encounter: Payer: Medicare HMO | Attending: Family | Admitting: Dietician

## 2020-11-04 ENCOUNTER — Encounter: Payer: Self-pay | Admitting: Dietician

## 2020-11-04 VITALS — Ht 67.0 in | Wt 176.7 lb

## 2020-11-04 DIAGNOSIS — E1122 Type 2 diabetes mellitus with diabetic chronic kidney disease: Secondary | ICD-10-CM | POA: Diagnosis not present

## 2020-11-04 DIAGNOSIS — N184 Chronic kidney disease, stage 4 (severe): Secondary | ICD-10-CM | POA: Diagnosis not present

## 2020-11-04 NOTE — Progress Notes (Signed)
Medical Nutrition Therapy: Visit start time: 1430  end time: 1600  Assessment:  Diagnosis: diabetes w/ CKD stage 4 Past medical history: CHF, HTN, high cholesterol  Psychosocial issues/ stress concerns: pt rates stress level as "moderate" and feels "ok" about stress management  Preferred learning method:  . Auditory . Visual . Hands-on  Current weight: 176.7 lbs  Height: 5'7" Medications, supplements: reconciled in medical record   Progress and evaluation:   Pt reports she has lived with diabetes for about 19 years  Pt reports she checks her BG about once a day, postprandial BGs 156-441  Pt states BGs below 150 lead to hypoglycemic signs and symptoms  Pt reports she quit smoking in 2019 and confirms her snacking increased afterwards   Pt HgA1c has been trending upward recently jumping up almost two points in a six month period  10.5 on 08/2020; 8.6 on 02/2020; 8.4 on 01/2020  Granddaughter of pt states the most prevalent change in pt's routine is less time spent watching younger grandchildren; pt agreed this has led to  more snacking d/t more free time   Physical activity: ADLs  Dietary Intake:  Usual eating pattern includes 3 meals and 1-2 snacks per day. Dining out frequency: 4 meals per week.  Breakfast: 1-2 packets instant oatmeal (plain) sometimes w/ raisins; pancakes with SF syrup; grits with margarine and pepper Snack: watermelon, honeydew, strawberries, grapes Lunch: turkey/ham [employer] sandwich with mayo, mustard and tomato and lettuce; soup with salt free crackers Snack: potato chips, fritos Supper: baked fish with coleslaw and 3-4 hushpuppies Beverages: decaf coffee with creamer, tea (sweet or unsweet), water, diet pepsi   Nutrition Care Education: Basic nutrition: basic food groups, appropriate nutrient balance, appropriate meal and snack schedule, general nutrition guidelines    Advanced nutrition: dining out, food label reading Diabetes:  goals for BGs, appropriate  meal and snack schedule, appropriate carb intake and balance, healthy carb choices, role of fiber, protein, fat Hypertension:  importance of controlling BP, identifying high sodium foods Hyperlipidemia: healthy and unhealthy fats, role of fiber, plant sterols  Nutritional Diagnosis:  [place]-2.2 Altered nutrition-related laboratory As related to type 2 diabetes .  As evidenced by HgA1c of 10.5 on 08/2020.  Intervention:  Discussion and instruction as noted above.  Additionally, pt diet recall showed pt does well with portion control and whole grain intake at meals, focused on ways improve quality of snacks and avoiding sugar-sweetened beverages.  Established pt-led goals for additional change.    Increase consistent fruit and vegetable intake   Incorporate vegetables at lunch and dinner   Incorporate more F/V at snacks  Try frozen veggies that come in microwaveable pouches (may be tender than fresh and low prep time)  Try canned fruit NOT in heavy syrup (mandarin oranges, peaches, pears, applesauce) for snacks + protein (cheese, PB)   Modify beverage consumption   Switch from sweet to unsweet tea with sugar substitute (e.g. Monia Pouch)  Switch from regular to diet soda  Drink at least 64oz water daily (add crystal light, lemon, or mio as needed)   Limit dark colas    Increase physical activity (will discuss at follow up)  Incrementally reintroduce exercise back into routine   150 minutes per week is recommendation   Try chair yoga or seated exercises on YouTube   Increase fiber intake   Maintain at least 3 servings of whole grains a day  Increase fruit and vegetable intake  Continue to limit sodium intake   Recommended limit: 1500mg /day  Read nutrition labels on packaged foods (like canned soups) to monitor sodium intake   400-600 mg/meal  <200 mg/snack   Education Materials given:  . NCM Chronic kidney disease nutrition therapy . Sodium free flavorings  . Plate Planner  with food lists . Goals/ instructions  Learner/ who was taught:  . Patient  . Family member: Clyde Canterbury (granddaughter)   Level of understanding: Marland Kitchen Verbalizes/ demonstrates competency  Demonstrated degree of understanding via:   Teach back Learning barriers: . None  Willingness to learn/ readiness for change: . Eager, change in progress  Monitoring and Evaluation:  Dietary intake, exercise, BGs and A1c, and body weight      follow up: January 11 at 10am

## 2020-11-04 NOTE — Patient Instructions (Addendum)
   More mindful snacking  Protein + carb   Switch to unsweet tea with truvia   Follow up appt January 11 at 10am

## 2020-11-25 ENCOUNTER — Other Ambulatory Visit: Payer: Self-pay

## 2020-11-25 DIAGNOSIS — N184 Chronic kidney disease, stage 4 (severe): Secondary | ICD-10-CM | POA: Insufficient documentation

## 2020-11-25 DIAGNOSIS — N179 Acute kidney failure, unspecified: Secondary | ICD-10-CM | POA: Insufficient documentation

## 2020-11-26 ENCOUNTER — Other Ambulatory Visit: Payer: Self-pay

## 2020-11-26 ENCOUNTER — Encounter: Payer: Self-pay | Admitting: Gastroenterology

## 2020-11-26 ENCOUNTER — Ambulatory Visit: Payer: Medicare HMO | Admitting: Gastroenterology

## 2020-11-26 VITALS — BP 145/69 | HR 90 | Temp 98.2°F | Ht 62.0 in | Wt 178.4 lb

## 2020-11-26 DIAGNOSIS — K5900 Constipation, unspecified: Secondary | ICD-10-CM

## 2020-11-26 DIAGNOSIS — D509 Iron deficiency anemia, unspecified: Secondary | ICD-10-CM | POA: Diagnosis not present

## 2020-11-26 NOTE — Patient Instructions (Signed)

## 2020-11-27 ENCOUNTER — Telehealth: Payer: Self-pay

## 2020-11-27 DIAGNOSIS — D509 Iron deficiency anemia, unspecified: Secondary | ICD-10-CM

## 2020-11-27 NOTE — Progress Notes (Signed)
Vonda Antigua, MD [address]  [place]  [place], [place] [address]  Main: [phone]  Fax: [phone]   Primary Care Physician: Elisabeth Cara, NP   Chief Complaint  Patient presents with  . Constipation    HPI: LILIANI BOBO is a 79 y.o. female presents for follow-up of constipation.  Patient reports having a soft bowel movement every day or every other day without straining.  No blood in her stool.  Has not been taking any MiraLAX for at least a month and has not had any constipation despite that.  The patient denies abdominal or flank pain, anorexia, nausea or vomiting, dysphagia, change in bowel habits or black or bloody stools or weight loss.  Previous history: Abdominal pain after Alka-Seltzer/NSAID use with CT showing duodenal inflammation and EGD by Dr. Vicente Males in April 2021, showing mild inflammation in the duodenal bulb with biopsies showing prominent Brunner's glands with overlying reactive foveolar hyperplasia and mild active duodenitis. No further NSAID use.  Previous EGD in June 2020 with clean-based duodenal bulb ulcer, grade a esophagitis.  CT on this hospitalization showed duodenitis which led to this EGD.    Last colonoscopy 2008, with two 8 mm polyps removed.  Pathology report not available.  A colonoscopy was planned in 2016 but patient was a no-show.  Patient has refused colonoscopy since then  Current Outpatient Medications  Medication Sig Dispense Refill  . albuterol (VENTOLIN HFA) 108 (90 Base) MCG/ACT inhaler Inhale 2 puffs into the lungs every 6 (six) hours as needed for wheezing.    Marland Kitchen amLODipine (NORVASC) 10 MG tablet Take 1 tablet by mouth daily.    Marland Kitchen aspirin EC 81 MG EC tablet Take 1 tablet (81 mg total) by mouth daily. 90 tablet 0  . atorvastatin (LIPITOR) 40 MG tablet Take 40 mg by mouth daily.     . cetirizine (ZYRTEC) 10 MG tablet Take 10 mg by mouth every other day.     . Choline Fenofibrate 45 MG capsule Take 45 mg by mouth  daily.     . clopidogrel (PLAVIX) 75 MG tablet Take 1 tablet (75 mg total) by mouth daily. 30 tablet 1  . glipiZIDE (GLUCOTROL XL) 10 MG 24 hr tablet Take 10 mg by mouth 2 (two) times daily.    . hydrALAZINE (APRESOLINE) 25 MG tablet Take 25 mg by mouth 2 (two) times daily.    . isosorbide mononitrate (IMDUR) 60 MG 24 hr tablet Take 1 tablet by mouth daily.    Marland Kitchen LANTUS SOLOSTAR 100 UNIT/ML Solostar Pen Inject 16 Units into the skin at bedtime.    Marland Kitchen losartan (COZAAR) 25 MG tablet Take 1 tablet (25 mg total) by mouth daily. 30 tablet 1  . metFORMIN (GLUCOPHAGE) 1000 MG tablet Take 1 tablet by mouth daily.    . metoprolol succinate (TOPROL-XL) 100 MG 24 hr tablet Take 1 tablet (100 mg total) by mouth daily. Take with or immediately following a meal. 30 tablet 1  . metoprolol tartrate (LOPRESSOR) 50 MG tablet Take 1 tablet by mouth daily.    . Multiple Vitamin (MULTIVITAMIN WITH MINERALS) TABS tablet Take 1 tablet by mouth daily.    . nitroGLYCERIN (NITROSTAT) 0.4 MG SL tablet Place 1 tablet (0.4 mg total) under the tongue every 5 (five) minutes as needed for chest pain. 30 tablet 0  . pantoprazole (PROTONIX) 40 MG tablet Take 40 mg by mouth daily.    . ranolazine (RANEXA) 500 MG 12 hr tablet Take 1  tablet by mouth in the morning and at bedtime.    . rosuvastatin (CRESTOR) 40 MG tablet Take 40 mg by mouth daily.    Marland Kitchen saccharomyces boulardii (FLORASTOR) 250 MG capsule Take 250 mg by mouth 2 (two) times daily.    . sitaGLIPtin (JANUVIA) 100 MG tablet Take 100 mg by mouth daily.    Marland Kitchen spironolactone (ALDACTONE) 25 MG tablet Take 1 tablet by mouth daily.    Marland Kitchen STIOLTO RESPIMAT 2.5-2.5 MCG/ACT AERS SMARTSIG:2 Puff(s) Via Inhaler Daily    . torsemide (DEMADEX) 20 MG tablet Take 40mg  (2 tablets) in the AM and 20mg  (1 tablet) in the PM 90 tablet 5   No current facility-administered medications for this visit.    Allergies as of 11/26/2020 - Review Complete 11/26/2020  Allergen Reaction Noted  . Accupril  [quinapril hcl] Other (See Comments) 08/01/2015    ROS:  General: Negative for anorexia, weight loss, fever, chills, fatigue, weakness. ENT: Negative for hoarseness, difficulty swallowing , nasal congestion. CV: Negative for chest pain, angina, palpitations, dyspnea on exertion, peripheral edema.  Respiratory: Negative for dyspnea at rest, dyspnea on exertion, cough, sputum, wheezing.  GI: See history of present illness. GU:  Negative for dysuria, hematuria, urinary incontinence, urinary frequency, nocturnal urination.  Endo: Negative for unusual weight change.    Physical Examination:   BP (!) 145/69   Pulse 90   Temp 98.2 F (36.8 C) (Oral)   Ht 5\' 2"  (1.575 m)   Wt 178 lb 6.4 oz (80.9 kg)   BMI 32.63 kg/m   General: Well-nourished, well-developed in no acute distress.  Eyes: No icterus. Conjunctivae pink. Mouth: Oropharyngeal mucosa moist and pink , no lesions erythema or exudate. Neck: Supple, Trachea midline Abdomen: Bowel sounds are normal, nontender, nondistended, no hepatosplenomegaly or masses, no abdominal bruits or hernia , no rebound or guarding.   Extremities: No lower extremity edema. No clubbing or deformities. Neuro: Alert and oriented x 3.  Grossly intact. Skin: Warm and dry, no jaundice.   Psych: Alert and cooperative, normal mood and affect.   Labs: CMP     Component Value Date/Time   NA 135 10/14/2020 1111   K 3.9 10/14/2020 1111   CL 98 10/14/2020 1111   CO2 24 10/14/2020 1111   GLUCOSE 398 (H) 10/14/2020 1111   BUN 35 (H) 10/14/2020 1111   CREATININE 2.83 (H) 10/14/2020 1111   CALCIUM 8.8 (L) 10/14/2020 1111   PROT 7.9 03/31/2020 1402   ALBUMIN 4.0 03/31/2020 1402   AST 13 (L) 03/31/2020 1402   ALT 12 03/31/2020 1402   ALKPHOS 39 03/31/2020 1402   BILITOT 0.8 03/31/2020 1402   GFRNONAA 15 (L) 10/14/2020 1111   GFRAA 28 (L) 09/20/2020 0403   Lab Results  Component Value Date   WBC 9.5 09/21/2020   HGB 9.0 (L) 09/21/2020   HCT 28.1 (L)  09/21/2020   MCV 80.3 09/21/2020   PLT 425 (H) 09/21/2020    Imaging Studies: No results found.  Assessment and Plan:   TYJAE SHVARTSMAN is a 79 y.o. y/o female here for follow-up of constipation  Symptoms resolved with high-fiber diet Patient is not taking any MiraLAX If constipation reoccurs, patient advised to let us know and she verbalized understanding High-fiber diet encouraged  Patient refuses screening colonoscopy.  2016 screening colonoscopy was scheduled but patient was a no-show  Patient refuses repeat EGD as well  Patient is otherwise asymptomatic  Care everywhere reveals microcytic anemia on November 25, 2020.  Will  refer to hematology  Dr Vonda Antigua

## 2020-11-27 NOTE — Telephone Encounter (Signed)
Called patient but had to leave a detailed message letting her know that due to her having anemia on her lab results, Dr. Bonna Gains wanted her to be seen by Dr. Tasia Catchings at the [facility]. I told her that she will be contacted by their office to schedule an appointment.

## 2020-11-27 NOTE — Telephone Encounter (Signed)
-----   Message from Virgel Manifold, MD sent at 11/27/2020  2:17 PM EST ----- Please let the patient know that I reviewed her blood work from November 25, 2020 and because she has anemia on those tests, I recommend a hematology referral for microcytic anemia.  Please refer to Dr. Tasia Catchings

## 2021-01-06 ENCOUNTER — Inpatient Hospital Stay: Payer: Medicare HMO

## 2021-01-06 ENCOUNTER — Inpatient Hospital Stay: Payer: Medicare HMO | Admitting: Oncology

## 2021-01-07 ENCOUNTER — Ambulatory Visit: Payer: Medicare HMO | Admitting: Family

## 2021-01-09 ENCOUNTER — Other Ambulatory Visit: Payer: Self-pay

## 2021-01-09 ENCOUNTER — Emergency Department: Payer: Medicare HMO

## 2021-01-09 ENCOUNTER — Inpatient Hospital Stay
Admit: 2021-01-09 | Discharge: 2021-01-09 | Disposition: A | Discharge status: Home / Self Care | Payer: Medicare HMO | Attending: Internal Medicine | Admitting: Internal Medicine

## 2021-01-09 ENCOUNTER — Inpatient Hospital Stay: Payer: Medicare HMO

## 2021-01-09 ENCOUNTER — Inpatient Hospital Stay
Admission: EM | Admit: 2021-01-09 | Discharge: 2021-01-16 | DRG: 281 | Disposition: A | Discharge status: Home Health | Payer: Medicare HMO | Attending: Internal Medicine | Admitting: Internal Medicine

## 2021-01-09 ENCOUNTER — Emergency Department
Admit: 2021-01-09 | Discharge: 2021-01-09 | Disposition: A | Discharge status: Home / Self Care | Payer: Medicare HMO | Attending: Emergency Medicine | Admitting: Emergency Medicine

## 2021-01-09 DIAGNOSIS — N2581 Secondary hyperparathyroidism of renal origin: Secondary | ICD-10-CM | POA: Diagnosis present

## 2021-01-09 DIAGNOSIS — Z79899 Other long term (current) drug therapy: Secondary | ICD-10-CM | POA: Diagnosis not present

## 2021-01-09 DIAGNOSIS — R112 Nausea with vomiting, unspecified: Secondary | ICD-10-CM | POA: Diagnosis present

## 2021-01-09 DIAGNOSIS — I214 Non-ST elevation (NSTEMI) myocardial infarction: Secondary | ICD-10-CM

## 2021-01-09 DIAGNOSIS — I130 Hypertensive heart and chronic kidney disease with heart failure and stage 1 through stage 4 chronic kidney disease, or unspecified chronic kidney disease: Secondary | ICD-10-CM | POA: Diagnosis present

## 2021-01-09 DIAGNOSIS — I10 Essential (primary) hypertension: Secondary | ICD-10-CM

## 2021-01-09 DIAGNOSIS — D631 Anemia in chronic kidney disease: Secondary | ICD-10-CM | POA: Diagnosis present

## 2021-01-09 DIAGNOSIS — I5042 Chronic combined systolic (congestive) and diastolic (congestive) heart failure: Secondary | ICD-10-CM | POA: Diagnosis present

## 2021-01-09 DIAGNOSIS — E1122 Type 2 diabetes mellitus with diabetic chronic kidney disease: Secondary | ICD-10-CM | POA: Diagnosis present

## 2021-01-09 DIAGNOSIS — K219 Gastro-esophageal reflux disease without esophagitis: Secondary | ICD-10-CM | POA: Diagnosis present

## 2021-01-09 DIAGNOSIS — E785 Hyperlipidemia, unspecified: Secondary | ICD-10-CM | POA: Diagnosis present

## 2021-01-09 DIAGNOSIS — Z7982 Long term (current) use of aspirin: Secondary | ICD-10-CM

## 2021-01-09 DIAGNOSIS — E559 Vitamin D deficiency, unspecified: Secondary | ICD-10-CM | POA: Diagnosis present

## 2021-01-09 DIAGNOSIS — I2510 Atherosclerotic heart disease of native coronary artery without angina pectoris: Secondary | ICD-10-CM | POA: Diagnosis present

## 2021-01-09 DIAGNOSIS — Z794 Long term (current) use of insulin: Secondary | ICD-10-CM | POA: Diagnosis not present

## 2021-01-09 DIAGNOSIS — I350 Nonrheumatic aortic (valve) stenosis: Secondary | ICD-10-CM | POA: Diagnosis present

## 2021-01-09 DIAGNOSIS — Z87891 Personal history of nicotine dependence: Secondary | ICD-10-CM | POA: Diagnosis not present

## 2021-01-09 DIAGNOSIS — I5032 Chronic diastolic (congestive) heart failure: Secondary | ICD-10-CM

## 2021-01-09 DIAGNOSIS — R0603 Acute respiratory distress: Secondary | ICD-10-CM | POA: Diagnosis present

## 2021-01-09 DIAGNOSIS — R0602 Shortness of breath: Secondary | ICD-10-CM

## 2021-01-09 DIAGNOSIS — Z7984 Long term (current) use of oral hypoglycemic drugs: Secondary | ICD-10-CM | POA: Diagnosis not present

## 2021-01-09 DIAGNOSIS — Z20822 Contact with and (suspected) exposure to covid-19: Secondary | ICD-10-CM | POA: Diagnosis present

## 2021-01-09 DIAGNOSIS — N179 Acute kidney failure, unspecified: Secondary | ICD-10-CM

## 2021-01-09 DIAGNOSIS — N189 Chronic kidney disease, unspecified: Secondary | ICD-10-CM | POA: Diagnosis present

## 2021-01-09 DIAGNOSIS — E875 Hyperkalemia: Secondary | ICD-10-CM | POA: Diagnosis not present

## 2021-01-09 DIAGNOSIS — Z7902 Long term (current) use of antithrombotics/antiplatelets: Secondary | ICD-10-CM

## 2021-01-09 DIAGNOSIS — Z9861 Coronary angioplasty status: Secondary | ICD-10-CM | POA: Diagnosis not present

## 2021-01-09 DIAGNOSIS — N184 Chronic kidney disease, stage 4 (severe): Secondary | ICD-10-CM | POA: Diagnosis present

## 2021-01-09 DIAGNOSIS — Z888 Allergy status to other drugs, medicaments and biological substances status: Secondary | ICD-10-CM | POA: Diagnosis not present

## 2021-01-09 DIAGNOSIS — E1129 Type 2 diabetes mellitus with other diabetic kidney complication: Secondary | ICD-10-CM | POA: Diagnosis present

## 2021-01-09 LAB — CBC
HCT: 32.2 % — ABNORMAL LOW (ref 36.0–46.0)
Hemoglobin: 10.1 g/dL — ABNORMAL LOW (ref 12.0–15.0)
MCH: 25.3 pg — ABNORMAL LOW (ref 26.0–34.0)
MCHC: 31.4 g/dL (ref 30.0–36.0)
MCV: 80.5 fL (ref 80.0–100.0)
Platelets: 571 10*3/uL — ABNORMAL HIGH (ref 150–400)
RBC: 4 MIL/uL (ref 3.87–5.11)
RDW: 16.3 % — ABNORMAL HIGH (ref 11.5–15.5)
WBC: 9.5 10*3/uL (ref 4.0–10.5)
nRBC: 0 % (ref 0.0–0.2)

## 2021-01-09 LAB — ECHOCARDIOGRAM COMPLETE
AR max vel: 2.42 cm2
AV Area VTI: 2.57 cm2
AV Area mean vel: 2.56 cm2
AV Mean grad: 4 mmHg
AV Peak grad: 6.9 mmHg
Ao pk vel: 1.31 m/s
Area-P 1/2: 5.27 cm2
Calc EF: 66.9 %
Height: 66 in
MV VTI: 2.28 cm2
S' Lateral: 3.69 cm
Single Plane A2C EF: 55.9 %
Single Plane A4C EF: 76 %
Weight: 2996.8 oz

## 2021-01-09 LAB — APTT: aPTT: 25 seconds (ref 24–36)

## 2021-01-09 LAB — BASIC METABOLIC PANEL
Anion gap: 15 (ref 5–15)
BUN: 50 mg/dL — ABNORMAL HIGH (ref 8–23)
CO2: 21 mmol/L — ABNORMAL LOW (ref 22–32)
Calcium: 8.9 mg/dL (ref 8.9–10.3)
Chloride: 100 mmol/L (ref 98–111)
Creatinine, Ser: 4.33 mg/dL — ABNORMAL HIGH (ref 0.44–1.00)
GFR, Estimated: 10 mL/min — ABNORMAL LOW (ref >60)
Glucose, Bld: 297 mg/dL — ABNORMAL HIGH (ref 70–99)
Potassium: 4.7 mmol/L (ref 3.5–5.1)
Sodium: 136 mmol/L (ref 135–145)

## 2021-01-09 LAB — CBG MONITORING, ED
Glucose-Capillary: 334 mg/dL — ABNORMAL HIGH (ref 70–99)
Glucose-Capillary: 265 mg/dL — ABNORMAL HIGH (ref 70–99)

## 2021-01-09 LAB — HEPATIC FUNCTION PANEL
ALT: 20 U/L (ref 0–44)
AST: 55 U/L — ABNORMAL HIGH (ref 15–41)
Albumin: 3.3 g/dL — ABNORMAL LOW (ref 3.5–5.0)
Alkaline Phosphatase: 31 U/L — ABNORMAL LOW (ref 38–126)
Bilirubin, Direct: <0.1 mg/dL (ref 0.0–0.2)
Total Bilirubin: 0.6 mg/dL (ref 0.3–1.2)
Total Protein: 7.2 g/dL (ref 6.5–8.1)

## 2021-01-09 LAB — TROPONIN I (HIGH SENSITIVITY)
Troponin I (High Sensitivity): 186 ng/L (ref <18)
Troponin I (High Sensitivity): 1020 ng/L (ref <18)
Troponin I (High Sensitivity): 5201 ng/L (ref <18)
Troponin I (High Sensitivity): 5528 ng/L (ref <18)
Troponin I (High Sensitivity): 13582 ng/L (ref <18)

## 2021-01-09 LAB — BRAIN NATRIURETIC PEPTIDE
B Natriuretic Peptide: 176.3 pg/mL — ABNORMAL HIGH (ref 0.0–100.0)
B Natriuretic Peptide: 410.8 pg/mL — ABNORMAL HIGH (ref 0.0–100.0)

## 2021-01-09 LAB — RESP PANEL BY RT-PCR (FLU A&B, COVID) ARPGX2
Influenza A by PCR: NEGATIVE
Influenza B by PCR: NEGATIVE
SARS Coronavirus 2 by RT PCR: NEGATIVE

## 2021-01-09 LAB — HEPARIN LEVEL (UNFRACTIONATED): Heparin Unfractionated: 0.13 IU/mL — ABNORMAL LOW (ref 0.30–0.70)

## 2021-01-09 LAB — PROTIME-INR
INR: 1 (ref 0.8–1.2)
Prothrombin Time: 12.5 seconds (ref 11.4–15.2)

## 2021-01-09 MED ORDER — RANOLAZINE ER 500 MG PO TB12
500.0000 mg | ORAL_TABLET | Freq: Two times a day (BID) | ORAL | Status: DC
  Administered 2021-01-09: 500 mg via ORAL
  Filled 2021-01-09 (×5): qty 1

## 2021-01-09 MED ORDER — ADULT MULTIVITAMIN W/MINERALS CH
1.0000 | ORAL_TABLET | Freq: Every day | ORAL | Status: DC
  Administered 2021-01-09 – 2021-01-16 (×8): 1 via ORAL
  Filled 2021-01-09 (×8): qty 1

## 2021-01-09 MED ORDER — HYDRALAZINE HCL 20 MG/ML IJ SOLN: 5.0000 mg | INTRAMUSCULAR | Status: DC | PRN

## 2021-01-09 MED ORDER — HEPARIN BOLUS VIA INFUSION
2000.0000 [IU] | Freq: Once | INTRAVENOUS | Status: AC
  Administered 2021-01-09: 2000 [IU] via INTRAVENOUS
  Filled 2021-01-09: qty 2000

## 2021-01-09 MED ORDER — AMLODIPINE BESYLATE 10 MG PO TABS
10.0000 mg | ORAL_TABLET | Freq: Every day | ORAL | Status: DC
  Administered 2021-01-09 – 2021-01-16 (×8): 10 mg via ORAL
  Filled 2021-01-09 (×3): qty 1
  Filled 2021-01-09: qty 2
  Filled 2021-01-09: qty 1
  Filled 2021-01-09: qty 2
  Filled 2021-01-09 (×2): qty 1

## 2021-01-09 MED ORDER — ATORVASTATIN CALCIUM 20 MG PO TABS
40.0000 mg | ORAL_TABLET | Freq: Every day | ORAL | Status: DC
  Administered 2021-01-09 – 2021-01-16 (×8): 40 mg via ORAL
  Filled 2021-01-09 (×8): qty 2

## 2021-01-09 MED ORDER — SODIUM CHLORIDE 0.9 % IV SOLN: INTRAVENOUS | Status: DC

## 2021-01-09 MED ORDER — MORPHINE SULFATE (PF) 2 MG/ML IV SOLN: 1.0000 mg | INTRAVENOUS | Status: DC | PRN

## 2021-01-09 MED ORDER — PERFLUTREN LIPID MICROSPHERE
1.0000 mL | INTRAVENOUS | Status: AC | PRN
  Administered 2021-01-09: 2 mL via INTRAVENOUS
  Filled 2021-01-09: qty 10

## 2021-01-09 MED ORDER — ASPIRIN EC 81 MG PO TBEC
81.0000 mg | DELAYED_RELEASE_TABLET | Freq: Every day | ORAL | Status: DC
  Administered 2021-01-10 – 2021-01-16 (×7): 81 mg via ORAL
  Filled 2021-01-09 (×7): qty 1

## 2021-01-09 MED ORDER — LORATADINE 10 MG PO TABS
10.0000 mg | ORAL_TABLET | Freq: Every day | ORAL | Status: DC
  Administered 2021-01-09 – 2021-01-16 (×8): 10 mg via ORAL
  Filled 2021-01-09 (×8): qty 1

## 2021-01-09 MED ORDER — IPRATROPIUM-ALBUTEROL 0.5-2.5 (3) MG/3ML IN SOLN: 3.0000 mL | Freq: Once | RESPIRATORY_TRACT | Status: AC

## 2021-01-09 MED ORDER — INSULIN GLARGINE 100 UNIT/ML ~~LOC~~ SOLN
12.0000 [IU] | Freq: Every day | SUBCUTANEOUS | Status: DC
  Administered 2021-01-09 – 2021-01-13 (×5): 12 [IU] via SUBCUTANEOUS
  Filled 2021-01-09 (×7): qty 0.12

## 2021-01-09 MED ORDER — METOPROLOL SUCCINATE ER 100 MG PO TB24
100.0000 mg | ORAL_TABLET | Freq: Every day | ORAL | Status: DC
  Administered 2021-01-10 – 2021-01-16 (×7): 100 mg via ORAL
  Filled 2021-01-09: qty 1
  Filled 2021-01-09: qty 2
  Filled 2021-01-09 (×5): qty 1

## 2021-01-09 MED ORDER — NITROGLYCERIN 0.4 MG SL SUBL
0.4000 mg | SUBLINGUAL_TABLET | SUBLINGUAL | Status: DC | PRN
  Administered 2021-01-09 (×2): 0.4 mg via SUBLINGUAL
  Filled 2021-01-09 (×2): qty 1

## 2021-01-09 MED ORDER — ISOSORBIDE MONONITRATE ER 60 MG PO TB24
60.0000 mg | ORAL_TABLET | Freq: Every day | ORAL | Status: DC
  Administered 2021-01-09: 60 mg via ORAL
  Filled 2021-01-09 (×2): qty 1

## 2021-01-09 MED ORDER — ASPIRIN EC 81 MG PO TBEC: 81.0000 mg | DELAYED_RELEASE_TABLET | Freq: Every day | ORAL | Status: DC

## 2021-01-09 MED ORDER — TECHNETIUM TO 99M ALBUMIN AGGREGATED
4.0000 | Freq: Once | INTRAVENOUS | Status: AC | PRN
  Administered 2021-01-09: 4.13 via INTRAVENOUS

## 2021-01-09 MED ORDER — ACETAMINOPHEN 325 MG PO TABS
650.0000 mg | ORAL_TABLET | Freq: Four times a day (QID) | ORAL | Status: DC | PRN
  Administered 2021-01-14: 650 mg via ORAL
  Filled 2021-01-09 (×2): qty 2

## 2021-01-09 MED ORDER — PANTOPRAZOLE SODIUM 40 MG PO TBEC
40.0000 mg | DELAYED_RELEASE_TABLET | Freq: Every day | ORAL | Status: DC
  Administered 2021-01-09 – 2021-01-16 (×8): 40 mg via ORAL
  Filled 2021-01-09 (×8): qty 1

## 2021-01-09 MED ORDER — HYDRALAZINE HCL 25 MG PO TABS
25.0000 mg | ORAL_TABLET | Freq: Two times a day (BID) | ORAL | Status: DC
  Administered 2021-01-09 – 2021-01-16 (×13): 25 mg via ORAL
  Filled 2021-01-09 (×13): qty 1

## 2021-01-09 MED ORDER — HEPARIN (PORCINE) 25000 UT/250ML-% IV SOLN
1250.0000 [IU]/h | INTRAVENOUS | Status: DC
  Administered 2021-01-09: 14:00:00 900 [IU]/h via INTRAVENOUS
  Administered 2021-01-11: 1250 [IU]/h via INTRAVENOUS
  Filled 2021-01-09 (×4): qty 250

## 2021-01-09 MED ORDER — HEPARIN BOLUS VIA INFUSION
4000.0000 [IU] | Freq: Once | INTRAVENOUS | Status: AC
  Administered 2021-01-09: 4000 [IU] via INTRAVENOUS
  Filled 2021-01-09: qty 4000

## 2021-01-09 MED ORDER — IPRATROPIUM-ALBUTEROL 0.5-2.5 (3) MG/3ML IN SOLN
RESPIRATORY_TRACT | Status: AC
  Administered 2021-01-09: 3 mL via RESPIRATORY_TRACT
  Filled 2021-01-09: qty 3

## 2021-01-09 MED ORDER — ONDANSETRON HCL 4 MG/2ML IJ SOLN
4.0000 mg | Freq: Three times a day (TID) | INTRAMUSCULAR | Status: DC | PRN
  Administered 2021-01-11 – 2021-01-16 (×3): 4 mg via INTRAVENOUS
  Filled 2021-01-09 (×3): qty 2

## 2021-01-09 MED ORDER — FENOFIBRATE 54 MG PO TABS: 54.0000 mg | ORAL_TABLET | Freq: Every day | ORAL | Status: DC

## 2021-01-09 MED ORDER — INSULIN ASPART 100 UNIT/ML ~~LOC~~ SOLN
0.0000 [IU] | Freq: Three times a day (TID) | SUBCUTANEOUS | Status: DC
  Administered 2021-01-09: 7 [IU] via SUBCUTANEOUS
  Administered 2021-01-10: 5 [IU] via SUBCUTANEOUS
  Administered 2021-01-10: 2 [IU] via SUBCUTANEOUS
  Administered 2021-01-11: 5 [IU] via SUBCUTANEOUS
  Administered 2021-01-11: 2 [IU] via SUBCUTANEOUS
  Administered 2021-01-11: 3 [IU] via SUBCUTANEOUS
  Administered 2021-01-12: 5 [IU] via SUBCUTANEOUS
  Administered 2021-01-12: 3 [IU] via SUBCUTANEOUS
  Administered 2021-01-13: 2 [IU] via SUBCUTANEOUS
  Administered 2021-01-13 – 2021-01-14 (×4): 3 [IU] via SUBCUTANEOUS
  Administered 2021-01-14: 5 [IU] via SUBCUTANEOUS
  Administered 2021-01-15: 7 [IU] via SUBCUTANEOUS
  Administered 2021-01-15: 5 [IU] via SUBCUTANEOUS
  Administered 2021-01-15: 3 [IU] via SUBCUTANEOUS
  Administered 2021-01-16 (×2): 5 [IU] via SUBCUTANEOUS
  Filled 2021-01-09 (×18): qty 1

## 2021-01-09 MED ORDER — NITROGLYCERIN IN D5W 200-5 MCG/ML-% IV SOLN
INTRAVENOUS | Status: AC
  Administered 2021-01-09: 40 ug/min via INTRAVENOUS
  Filled 2021-01-09: qty 250

## 2021-01-09 MED ORDER — ALBUTEROL SULFATE HFA 108 (90 BASE) MCG/ACT IN AERS
2.0000 | INHALATION_SPRAY | RESPIRATORY_TRACT | Status: DC | PRN
  Filled 2021-01-09: qty 6.7

## 2021-01-09 MED ORDER — CALCITRIOL 0.25 MCG PO CAPS
0.2500 ug | ORAL_CAPSULE | Freq: Every day | ORAL | Status: DC
  Administered 2021-01-09 – 2021-01-16 (×8): 0.25 ug via ORAL
  Filled 2021-01-09 (×8): qty 1

## 2021-01-09 MED ORDER — NITROGLYCERIN IN D5W 200-5 MCG/ML-% IV SOLN
0.0000 ug/min | INTRAVENOUS | Status: DC
  Administered 2021-01-10: 25 ug/min via INTRAVENOUS
  Filled 2021-01-09: qty 250

## 2021-01-09 MED ORDER — FUROSEMIDE 10 MG/ML IJ SOLN
40.0000 mg | Freq: Once | INTRAMUSCULAR | Status: AC
  Administered 2021-01-09: 40 mg via INTRAVENOUS
  Filled 2021-01-09: qty 4

## 2021-01-09 MED ORDER — INSULIN ASPART 100 UNIT/ML ~~LOC~~ SOLN
0.0000 [IU] | Freq: Every day | SUBCUTANEOUS | Status: DC
  Administered 2021-01-09: 3 [IU] via SUBCUTANEOUS
  Administered 2021-01-10: 2 [IU] via SUBCUTANEOUS
  Administered 2021-01-12: 3 [IU] via SUBCUTANEOUS
  Administered 2021-01-13: 4 [IU] via SUBCUTANEOUS
  Administered 2021-01-14: 5 [IU] via SUBCUTANEOUS
  Administered 2021-01-15: 3 [IU] via SUBCUTANEOUS
  Filled 2021-01-09 (×5): qty 1

## 2021-01-09 NOTE — ED Notes (Signed)
Patient called out for shortness of breath, upon entering room patient noted to be diaphoretic and have labored breathing. Oxygen 66% on RA. HR 148. Staff assist called. Repeat EKG performed. Dr. Joni Fears at bedside. Bipap, CXR and IV nitro ordered.

## 2021-01-09 NOTE — Progress Notes (Signed)
*  PRELIMINARY RESULTS* Echocardiogram 2D Echocardiogram has been performed.  Brittney [name] 01/09/2021, 2:23 PM

## 2021-01-09 NOTE — ED Notes (Signed)
Pt to go to Nuc Med at approx 1230PM today.

## 2021-01-09 NOTE — ED Provider Notes (Signed)
.  Critical Care Performed by: Carrie Mew, MD Authorized by: Carrie Mew, MD   Critical care provider statement:    Critical care time (minutes):  40   Critical care time was exclusive of:  Separately billable procedures and treating other patients   Critical care was time spent personally by me on the following activities:  Development of treatment plan with patient or surrogate, discussions with consultants, evaluation of patient's response to treatment, examination of patient, obtaining history from patient or surrogate, ordering and performing treatments and interventions, ordering and review of laboratory studies, ordering and review of radiographic studies, pulse oximetry, re-evaluation of patient's condition and review of old charts       ----------------------------------------- 4:50 PM on 01/09/2021 ----------------------------------------- Called to bedside by nurse due to patient found to be in sudden respiratory distress, hypoxic to 66% on oxygen support.  Patient tachypneic with accessory muscle use, gurgling respirations, diffuse rhonchi, concerning for flash pulmonary edema.  I called respiratory therapy who came to the ED right away to place her on BiPAP.  Advised the patient if she is still struggling to breathe she may need intubation and mechanical ventilation to which she agrees.  Troponin has continued to rise, most recently to 5000.  I am worried about acute heart failure in the setting of NSTEMI.  Will start nitroglycerin infusion as well.  Will obtain repeat chest x-ray  Repeat EKG obtained, shows sinus tachycardia with a rate of 145.  No acute ischemic changes.  ----------------------------------------- 4:55 PM on 01/09/2021 -----------------------------------------  Repeat chest x-ray image viewed and interpreted by me, shows widespread acute pulmonary edema   ----------------------------------------- 5:28 PM on  01/09/2021 -----------------------------------------  Appears improved and much more comfortable, rhonchi are improved.  Work of breathing is decreased.  Oxygenation 100% on BiPAP.  Tachycardia improved to 120.  Blood pressure decreased from 190/100 to 120/100.  We will continue to monitor blood pressure, may need to titrate down nitroglycerin infusion.    Carrie Mew, MD 01/09/21 1729

## 2021-01-09 NOTE — ED Notes (Signed)
RT at bedside, patient placed on bipap

## 2021-01-09 NOTE — ED Notes (Signed)
ECHO at bedside.

## 2021-01-09 NOTE — ED Triage Notes (Signed)
PT to ED via eMS from home. PT c/o Cp starting at 1am, sharp, radiates down left side Hx chf  End tital 25 cbg 369  Pt took 4 baby aspirin at home  (1) 463mcg spray of nitro given by EMS

## 2021-01-09 NOTE — ED Notes (Signed)
Dr. Blaine Hamper aware of patient status.

## 2021-01-09 NOTE — Consult Note (Signed)
CARDIOLOGY CONSULT NOTE               Patient ID: Brittney [name] MRN: [id number] DOB/AGE: September 23, 1941 80 y.o.  Admit date: 01/09/2021 Referring Physician Ivor Costa  Primary Physician Elisabeth Cara, NP Primary Cardiologist Dr. Clayborn Bigness  Reason for Consultation Chest pain, NSTEMI  HPI: Brittney [name] is a 80 year old female with a past medical history significant for coronary artery disease, HFpEF, aortic valve stenosis, type 2 diabetes, chronic kidney disease, hyperlipidemia, and hypertension who presented to the ED on 01/09/21 for a one day history of chest pain with associated shortness of breath.  Workup in the ED has been significant for ECG revealing sinus tachycardia, rate of 111bpm, with nonspecific ST abnormalities, chest xray revealing suspected acute interstitial edema, high sensitivity troponin 186 and 1020 respectively, BNP of 176, COVID-19 negative, creatinine of 4.33, and a GFR of 10.   She is followed in outpatient cardiology by Dr. Clayborn Bigness. Most recent echocardiogram on 11/12/20 revealed normal RV and LV systolic function with an EF estimated greater than 55% with moderate MR, mild TR, mild AR, and mild LVH.  Stress test on 06/25/20 revealed no evidence of ischemia.  Left heart catheterization on 01/23/19 revealed moderate to severe disease in the proximal LAD, 70% stenosed, as well as 70% stenosis in the distal LAD.  RCA had a 99% mid to distal lesion with calcification.   01/09/21: On exam, Brittney [name] reports significant improvement in chest pressure and shortness of breath.  She is lying comfortably in bed, in no acute distress.  She denies significant lower extremity swelling or evidence of orthopnea or PND.  She denies recent syncopal or presyncopal episodes.   Review of systems complete and found to be negative unless listed above     Past Medical History:  Diagnosis Date  . Aortic valve stenosis   . Carotid bruit   . CHF (congestive heart failure) ([place])   . Chronic  kidney disease   . Diabetes mellitus without complication ([place])   . GERD (gastroesophageal reflux disease)   . Heart murmur   . Hyperlipidemia   . Hypertension   . Pneumonia   . Vitamin D deficiency     Past Surgical History:  Procedure Laterality Date  . COLONOSCOPY    . COLONOSCOPY WITH PROPOFOL N/A 08/02/2019   Procedure: COLONOSCOPY WITH PROPOFOL;  Surgeon: Virgel Manifold, MD;  Location: [facility] ENDOSCOPY;  Service: Endoscopy;  Laterality: N/A;  . CORONARY ANGIOPLASTY    . ESOPHAGOGASTRODUODENOSCOPY N/A 06/26/2019   Procedure: ESOPHAGOGASTRODUODENOSCOPY (EGD);  Surgeon: Virgel Manifold, MD;  Location[facility] ENDOSCOPY;  Service: Endoscopy;  Laterality: N/A;  . ESOPHAGOGASTRODUODENOSCOPY (EGD) WITH PROPOFOL N/A 08/02/2019   Procedure: ESOPHAGOGASTRODUODENOSCOPY (EGD) WITH PROPOFOL;  Surgeon: Virgel Manifold, MD;  Location: [facility] ENDOSCOPY;  Service: Endoscopy;  Laterality: N/A;  . ESOPHAGOGASTRODUODENOSCOPY (EGD) WITH PROPOFOL N/A 04/03/2020   Procedure: ESOPHAGOGASTRODUODENOSCOPY (EGD) WITH PROPOFOL;  Surgeon: [name], MD;  Location: [facility] ENDOSCOPY;  Service: Gastroenterology;  Laterality: N/A;  . LEFT HEART CATH AND CORONARY ANGIOGRAPHY N/A 01/23/2019   Procedure: LEFT HEART CATH AND CORONARY ANGIOGRAPHY;  Surgeon: Yolonda Kida, MD;  Location: [place] [facility] LAB;  Service: Cardiovascular;  Laterality: N/A;  . ovarian cyst removal      (Not in a hospital admission)  Social History   Socioeconomic History  . Marital status: Widowed    Spouse name: Not on file  . Number of children: Not on file  . Years of education: Not  on file  . Highest education level: Not on file  Occupational History  . Not on file  Tobacco Use  . Smoking status: Former Smoker    Packs/day: 1.00    Years: 40.00    Pack years: 40.00    Types: Cigarettes    Quit date: 07/27/2018    Years since quitting: 2.4  . Smokeless tobacco: Never Used  Vaping Use  . Vaping Use: Never used   Substance and Sexual Activity  . Alcohol use: No  . Drug use: No  . Sexual activity: Not on file  Other Topics Concern  . Not on file  Social History Narrative  . Not on file   Social Determinants of Health   Financial Resource Strain: Not on file  Food Insecurity: Not on file  Transportation Needs: Not on file  Physical Activity: Not on file  Stress: Not on file  Social Connections: Not on file  Intimate Partner Violence: Not on file    Family History  Problem Relation Age of Onset  . Diabetes Neg Hx   . Hypertension Neg Hx   . Breast cancer Neg Hx       Review of systems complete and found to be negative unless listed above      PHYSICAL EXAM  General: Well developed, well nourished, in no acute distress HEENT:  Normocephalic and atramatic Neck:  No JVD.  Lungs: Clear bilaterally to auscultation and percussion. Heart: HRRR . Normal S1 and S2 without gallops or murmurs.  Abdomen: Bowel sounds are positive, abdomen soft and non-tender  Msk:  Back normal. Normal strength and tone for age. Extremities: No clubbing, cyanosis or edema.   Neuro: Alert and oriented X 3. Psych:  Good affect, responds appropriately  Labs:   Lab Results  Component Value Date   WBC 9.5 01/09/2021   HGB 10.1 (L) 01/09/2021   HCT 32.2 (L) 01/09/2021   MCV 80.5 01/09/2021   PLT 571 (H) 01/09/2021    Recent Labs  Lab 01/09/21 0813  NA 136  K 4.7  CL 100  CO2 21*  BUN 50*  CREATININE 4.33*  CALCIUM 8.9  GLUCOSE 297*   Lab Results  Component Value Date   TROPONINI 0.28 (HH) 01/02/2019    Lab Results  Component Value Date   CHOL 139 09/21/2020   CHOL 123 03/20/2020   Lab Results  Component Value Date   HDL 25 (L) 09/21/2020   HDL 31 (L) 03/20/2020   Lab Results  Component Value Date   LDLCALC 61 09/21/2020   LDLCALC 57 03/20/2020   Lab Results  Component Value Date   TRIG 263 (H) 09/21/2020   TRIG 176 (H) 03/20/2020   Lab Results  Component Value Date    CHOLHDL 5.6 09/21/2020   CHOLHDL 4.0 03/20/2020   No results found for: LDLDIRECT    Radiology: DG Chest 2 View  Result Date: 01/09/2021 CLINICAL DATA:  80 year old female with shortness of breath. EXAM: CHEST - 2 VIEW COMPARISON:  Radiographs 09/20/2020 and earlier. FINDINGS: Calcified aortic atherosclerosis. Cardiac size is at the upper limits of normal. Other mediastinal contours are within normal limits. Visualized tracheal air column is within normal limits. Chronic but diffusely increased pulmonary interstitium when compared to 09/20/2020 PA and lateral study. No pneumothorax, layering pleural effusion or consolidation. There does appear to be trace pleural fluid in the fissures now. Osteopenia. No acute osseous abnormality identified. Negative visible bowel gas pattern. IMPRESSION: 1. Acute on chronic interstitial opacity with  suspected trace fluid in the fissures. Favor acute interstitial edema over viral/atypical respiratory infection. 2.  Aortic Atherosclerosis (ICD10-I70.0). Electronically Signed   By: Genevie Ann M.D.   On: 01/09/2021 08:39    EKG: Sinus tachycardia, rate of 111bpm, nonspecific ST abnormalities   ASSESSMENT AND PLAN:  1.  Chest pain/elevated troponin   -186, 1020 respectively; third troponin pending   -Patient is currently chest pain free; due to AKI, will defer invasive workup with a LHC at this time; if renal function improves, may consider further workup early next week pending clinical status   -Echocardiogram scheduled to assess LV function, evidence of wall motion abnormalities   -Continue heparin, sublingual nitroglycerin   2.  History of HFpEF   -Chest xray revealing possible edema, BNP slightly elevated but no clinical evidence of HF   3.  Acute on chronic renal insufficiency   -Creatinine on admission 4.33; nephrology consulted   4.  Mitral insufficiency   -Moderate per recent echo; echo scheduled   The history, physical exam findings, and plan of care  were all discussed with Dr. Bartholome Bill, and all decision making was made in collaboration.   Signed: Avie Arenas PA-C 01/09/2021, 12:35 PM

## 2021-01-09 NOTE — Progress Notes (Signed)
[place] 01/09/21  Subjective:   LOS: 0 No intake/output data recorded. Patient known to our practice from outpatient follow-up.  She is followed for chronic kidney disease secondary to diabetic nephropathy.  She presented to the emergency room via EMS from home complaining of chest pain radiating down to her left side.  Currently undergoing cardiac work-up.  Denies chest pain at the moment.  Serum creatinine has worsened to 4.33. Otherwise she states appetite is good.  No nausea or vomiting  Objective:  Vital signs in last 24 hours:  Temp:  [98.1 F (36.7 C)] 98.1 F (36.7 C) (01/14 0806) Pulse Rate:  [78-115] 104 (01/14 1334) Resp:  [18-30] 19 (01/14 1334) BP: (131-168)/(48-98) 131/98 (01/14 1333) SpO2:  [93 %-99 %] 99 % (01/14 1334) Weight:  [79.4 kg-85 kg] 85 kg (01/14 1001)  Weight change:  Filed Weights   01/09/21 0807 01/09/21 1001  Weight: 79.4 kg 85 kg    Intake/Output:   No intake or output data in the 24 hours ending 01/09/21 1352  Physical Exam: General:  No acute distress, laying in the bed  HEENT  anicteric, moist oral mucous membrane  Pulm/lungs  normal breathing effort, lungs are clear to auscultation  CVS/Heart  regular rhythm, no rub or gallop  Abdomen:   Soft, nontender  Extremities:  No peripheral edema  Neurologic:  Alert, oriented, able to follow commands  Skin:  No acute rashes      Basic Metabolic Panel:  Recent Labs  Lab 01/09/21 0813  NA 136  K 4.7  CL 100  CO2 21*  GLUCOSE 297*  BUN 50*  CREATININE 4.33*  CALCIUM 8.9     CBC: Recent Labs  Lab 01/09/21 0813  WBC 9.5  HGB 10.1*  HCT 32.2*  MCV 80.5  PLT 571*      Lab Results  Component Value Date   HEPBSAG NON REACTIVE 02/02/2020   HEPBSAB NON REACTIVE 02/02/2020   HEPBIGM NON REACTIVE 02/02/2020      Microbiology:  Recent Results (from the past 240 hour(s))  Resp Panel by RT-PCR (Flu A&B, Covid) Nasopharyngeal Swab      Status: None   Collection Time: 01/09/21  9:55 AM   Specimen: Nasopharyngeal Swab; Nasopharyngeal(NP) swabs in vial transport medium  Result Value Ref Range Status   SARS Coronavirus 2 by RT PCR NEGATIVE NEGATIVE Final    Comment: (NOTE) SARS-CoV-2 target nucleic acids are NOT DETECTED.  The SARS-CoV-2 RNA is generally detectable in upper respiratory specimens during the acute phase of infection. The lowest concentration of SARS-CoV-2 viral copies this assay can detect is 138 copies/mL. A negative result does not preclude SARS-Cov-2 infection and should not be used as the sole basis for treatment or other patient management decisions. A negative result may occur with  improper specimen collection/handling, submission of specimen other than nasopharyngeal swab, presence of viral mutation(s) within the areas targeted by this assay, and inadequate number of viral copies(<138 copies/mL). A negative result must be combined with clinical observations, patient history, and epidemiological information. The expected result is Negative.  Fact Sheet for Patients:  EntrepreneurPulse.com.au  Fact Sheet for Healthcare Providers:  IncredibleEmployment.be  This test is no t yet approved or cleared by the [place] FDA and  has been authorized for detection and/or diagnosis of SARS-CoV-2 by FDA under an Emergency Use Authorization (EUA). This EUA will remain  in effect (meaning this test can be used) for the duration of the COVID-19 declaration  under Section 564(b)(1) of the Act, 21 U.S.C.section 360bbb-3(b)(1), unless the authorization is terminated  or revoked sooner.       Influenza A by PCR NEGATIVE NEGATIVE Final   Influenza B by PCR NEGATIVE NEGATIVE Final    Comment: (NOTE) The Xpert Xpress SARS-CoV-2/FLU/RSV plus assay is intended as an aid in the diagnosis of influenza from Nasopharyngeal swab specimens and should not be used as a sole basis  for treatment. Nasal washings and aspirates are unacceptable for Xpert Xpress SARS-CoV-2/FLU/RSV testing.  Fact Sheet for Patients: EntrepreneurPulse.com.au  Fact Sheet for Healthcare Providers: IncredibleEmployment.be  This test is not yet approved or cleared by the [place] FDA and has been authorized for detection and/or diagnosis of SARS-CoV-2 by FDA under an Emergency Use Authorization (EUA). This EUA will remain in effect (meaning this test can be used) for the duration of the COVID-19 declaration under Section 564(b)(1) of the Act, 21 U.S.C. section 360bbb-3(b)(1), unless the authorization is terminated or revoked.  Performed at [facility], [address] [place], [place] [address]     Coagulation Studies: Recent Labs    01/09/21 1113  LABPROT 12.5  INR 1.0    Urinalysis: No results for input(s): COLORURINE, LABSPEC, PHURINE, GLUCOSEU, HGBUR, BILIRUBINUR, KETONESUR, PROTEINUR, UROBILINOGEN, NITRITE, LEUKOCYTESUR in the last 72 hours.  Invalid input(s): APPERANCEUR    Imaging: DG Chest 2 View  Result Date: 01/09/2021 CLINICAL DATA:  80 year old female with shortness of breath. EXAM: CHEST - 2 VIEW COMPARISON:  Radiographs 09/20/2020 and earlier. FINDINGS: Calcified aortic atherosclerosis. Cardiac size is at the upper limits of normal. Other mediastinal contours are within normal limits. Visualized tracheal air column is within normal limits. Chronic but diffusely increased pulmonary interstitium when compared to 09/20/2020 PA and lateral study. No pneumothorax, layering pleural effusion or consolidation. There does appear to be trace pleural fluid in the fissures now. Osteopenia. No acute osseous abnormality identified. Negative visible bowel gas pattern. IMPRESSION: 1. Acute on chronic interstitial opacity with suspected trace fluid in the fissures. Favor acute interstitial edema over viral/atypical respiratory  infection. 2.  Aortic Atherosclerosis (ICD10-I70.0). Electronically Signed   By: Genevie Ann M.D.   On: 01/09/2021 08:39   NM Pulmonary Perfusion  Result Date: 01/09/2021 CLINICAL DATA:  Chest pressure and shortness of breath today. EXAM: NUCLEAR MEDICINE PERFUSION LUNG SCAN TECHNIQUE: Perfusion images were obtained in multiple projections after intravenous injection of radiopharmaceutical. Ventilation scans intentionally deferred if perfusion scan and chest x-ray adequate for interpretation during COVID 19 epidemic. RADIOPHARMACEUTICALS:  4.13 mCi Tc-44m MAA IV COMPARISON:  PA and lateral chest today. FINDINGS: Perfusion appears normal without segmental or subsegmental defect. IMPRESSION: Negative for PE. Electronically Signed   By: Inge Rise M.D.   On: 01/09/2021 13:32     Medications:   . heparin 900 Units/hr (01/09/21 1338)   . insulin aspart  0-5 Units Subcutaneous QHS  . insulin aspart  0-9 Units Subcutaneous TID WC   acetaminophen, hydrALAZINE, morphine injection, nitroGLYCERIN, ondansetron (ZOFRAN) IV  Assessment/ Plan:  80 y.o. female with hypertension, systolic congestive heart failure, hyperlipidemia, diabetes mellitus type II insulin dependent, aortic valve stenosis  Admitted on 01/09/2021 for   Active Problems:   NSTEMI (non-ST elevated myocardial infarction) ([place])   #. CKD st 4/5 Recent Labs    01/09/21 0813  CREATININE 4.33*  Baseline Creatinine 3.10 / GFR 14-16 on 11/25/2020 Presenting Creatinine 4.33/GFR 10 AKI versus progression of underlying chronic kidney disease No obvious uremic symptoms.  No acute indication for  dialysis at present.  However, discussed with patient that if renal parameters continue to worsen, she may have to start dialysis soon.   #. Anemia of CKD  Lab Results  Component Value Date   HGB 10.1 (L) 01/09/2021  Acceptable Consider Epogen for hemoglobin less than nine  #.  Bone and mineral metabolism     Component Value Date/Time    PTH 35 02/02/2020 0553   Lab Results  Component Value Date   PHOS 4.2 02/02/2020    #. HTN with CKD Blood pressure control acceptable at 146/62  #. Diabetes type 2 with CKD Hgb A1c MFr Bld (%)  Date Value  09/20/2020 10.5 (H)     LOS: 0 Niaya Hickok 1/14/20221:52 [place], [place]

## 2021-01-09 NOTE — Consult Note (Signed)
ANTICOAGULATION CONSULT NOTE  Pharmacy Consult for Heparin Infusion Indication: chest pain/ACS  Patient Measurements: Heparin Dosing Weight: 75.7 kg  Labs: Recent Labs    01/09/21 0813 01/09/21 0955  HGB 10.1*  --   HCT 32.2*  --   PLT 571*  --   CREATININE 4.33*  --   TROPONINIHS 186* 1,020*    Estimated Creatinine Clearance: 11.4 mL/min (A) (by C-G formula based on SCr of 4.33 mg/dL (H)).   Medical History: Past Medical History:  Diagnosis Date  . Aortic valve stenosis   . Carotid bruit   . CHF (congestive heart failure) ([place])   . Chronic kidney disease   . Diabetes mellitus without complication ([place])   . GERD (gastroesophageal reflux disease)   . Heart murmur   . Hyperlipidemia   . Hypertension   . Pneumonia   . Vitamin D deficiency     Medications:  No anticoagulation prior to admission per my chart review  Assessment: Patient is an 80 y/o F with medical history as above and including three-vessel CAD who presented to the ED 1/14 with chest pain. Troponin 186 >> 1020. Pharmacy has been consulted to initiate heparin infusion for ACS.  Baseline CBC notable for Hgb 10.1, platelets 571. Baseline aPTT and PT-INR pending.   Goal of Therapy:  Heparin level 0.3-0.7 units/ml Monitor platelets by anticoagulation protocol: Yes   Plan:  --Heparin 4000 unit IV bolus x 1 followed by continuous infusion at 900 units/hr --Heparin level 8 hours after initiation of infusion --Daily CBC per protocol while on heparin infusion  Benita Gutter 01/09/2021,11:12 AM

## 2021-01-09 NOTE — H&P (Signed)
History and Physical    Brittney [name] ZOX:[id number] DOB: Jun 03, 1941 DOA: 01/09/2021  Referring MD/NP/PA:   PCP: Elisabeth Cara, NP   Patient coming from:  The patient is coming from home.  At baseline, pt is independent for most of ADL.        Chief Complaint: chest pain  HPI: Brittney [name] is a 80 y.o. female with medical history significant of hypertension, hyperlipidemia, diabetes mellitus, GERD, CKD stage IV, dCHF, aortic valve stenosis, CAD, who presents with chest pain shortness of breath.  Patient states that his chest pain started in the early morning at about 1 AM, which is located in the front chest, sharp, moderate, radiating to the left side of the body, associated with shortness of breath.  No fever, chills, cough.  Patient stated she had nausea and vomited once in the early morning, currently no nausea, vomiting, diarrhea or abdominal pain.  No symptoms of UTI or unilateral weakness.  ED Course: pt was found to have WBC 9.5, troponin level 186, 1020, BNP 176,  V/Q scan negative for PE, negative COVID PCR, worsening renal function with creatinine 4.33, BUN 50 (recent baseline creatinine 2.83 on 10/14/2020), temperature normal, blood pressure 136/68, tachycardic with heart rate 115, 103, 30, 23, oxygen saturation 93% on room air.  Chest x-ray showed possible interstitial edema.  Patient is admitted to progressive bed as inpatient.  Dr. Ubaldo Glassing of cardiology is consulted  Review of Systems:   General: no fevers, chills, no body weight gain, has fatigue HEENT: no blurry vision, hearing changes or sore throat Respiratory: has dyspnea, coughing, no wheezing CV: has chest pain, no palpitations GI: currently no nausea, vomiting, abdominal pain, diarrhea, constipation GU: no dysuria, burning on urination, increased urinary frequency, hematuria  Ext: has trace leg edema Neuro: no unilateral weakness, numbness, or tingling, no vision change or hearing loss Skin: no rash, no  skin tear. MSK: No muscle spasm, no deformity, no limitation of range of movement in spin Heme: No easy bruising.  Travel history: No recent long distant travel.  Allergy:  Allergies  Allergen Reactions  . Accupril [Quinapril Hcl] Other (See Comments)    Reaction: unknown    Past Medical History:  Diagnosis Date  . Aortic valve stenosis   . Carotid bruit   . CHF (congestive heart failure) ([place])   . Chronic kidney disease   . Diabetes mellitus without complication ([place])   . GERD (gastroesophageal reflux disease)   . Heart murmur   . Hyperlipidemia   . Hypertension   . Pneumonia   . Vitamin D deficiency     Past Surgical History:  Procedure Laterality Date  . COLONOSCOPY    . COLONOSCOPY WITH PROPOFOL N/A 08/02/2019   Procedure: COLONOSCOPY WITH PROPOFOL;  Surgeon: Virgel Manifold, MD;  Location: [facility] ENDOSCOPY;  Service: Endoscopy;  Laterality: N/A;  . CORONARY ANGIOPLASTY    . ESOPHAGOGASTRODUODENOSCOPY N/A 06/26/2019   Procedure: ESOPHAGOGASTRODUODENOSCOPY (EGD);  Surgeon: Virgel Manifold, MD;  Location: [facility] ENDOSCOPY;  Service: Endoscopy;  Laterality: N/A;  . ESOPHAGOGASTRODUODENOSCOPY (EGD) WITH PROPOFOL N/A 08/02/2019   Procedure: ESOPHAGOGASTRODUODENOSCOPY (EGD) WITH PROPOFOL;  Surgeon: Virgel Manifold, MD;  Location: [facility] ENDOSCOPY;  Service: Endoscopy;  Laterality: N/A;  . ESOPHAGOGASTRODUODENOSCOPY (EGD) WITH PROPOFOL N/A 04/03/2020   Procedure: ESOPHAGOGASTRODUODENOSCOPY (EGD) WITH PROPOFOL;  Surgeon: [name], MD;  Location: [facility] ENDOSCOPY;  Service: Gastroenterology;  Laterality: N/A;  . LEFT HEART CATH AND CORONARY ANGIOGRAPHY N/A 01/23/2019   Procedure: LEFT HEART CATH AND  CORONARY ANGIOGRAPHY;  Surgeon: Yolonda Kida, MD;  Location: [place] [facility];  Service: Cardiovascular;  Laterality: N/A;  . ovarian cyst removal      Social History:  reports that she quit smoking about 2 years ago. Her smoking use included cigarettes. She has a 40.00  pack-year smoking history. She has never used smokeless tobacco. She reports that she does not drink alcohol and does not use drugs.  Family History:  Family History  Problem Relation Age of Onset  . Diabetes Neg Hx   . Hypertension Neg Hx   . Breast cancer Neg Hx      Prior to Admission medications   Medication Sig Start Date End Date Taking? Authorizing Provider  albuterol (VENTOLIN HFA) 108 (90 Base) MCG/ACT inhaler Inhale 2 puffs into the lungs every 6 (six) hours as needed for wheezing.    [provider]  amLODipine (NORVASC) 10 MG tablet Take 1 tablet by mouth daily.    [provider]  aspirin EC 81 MG EC tablet Take 1 tablet (81 mg total) by mouth daily. 06/27/19   Gouru, Illene Silver, MD  atorvastatin (LIPITOR) 40 MG tablet Take 40 mg by mouth daily.  07/04/18   [provider]  cetirizine (ZYRTEC) 10 MG tablet Take 10 mg by mouth every other day.     [provider]  Choline Fenofibrate 45 MG capsule Take 45 mg by mouth daily.     [provider]  clopidogrel (PLAVIX) 75 MG tablet Take 1 tablet (75 mg total) by mouth daily. 09/22/20   Ezekiel Slocumb, DO  glipiZIDE (GLUCOTROL XL) 10 MG 24 hr tablet Take 10 mg by mouth 2 (two) times daily.    [provider]  hydrALAZINE (APRESOLINE) 25 MG tablet Take 25 mg by mouth 2 (two) times daily. 08/25/20   [provider]  isosorbide mononitrate (IMDUR) 60 MG 24 hr tablet Take 1 tablet by mouth daily. 11/19/20   [provider]  LANTUS SOLOSTAR 100 UNIT/ML Solostar Pen Inject 16 Units into the skin at bedtime. 10/19/19   [provider]  losartan (COZAAR) 25 MG tablet Take 1 tablet (25 mg total) by mouth daily. 09/21/20   Ezekiel Slocumb, DO  metFORMIN (GLUCOPHAGE) 1000 MG tablet Take 1 tablet by mouth daily. 09/24/20   [provider]  metoprolol succinate (TOPROL-XL) 100 MG 24 hr tablet Take 1 tablet (100 mg total) by mouth daily. Take with or immediately  following a meal. 09/22/20   Nicole Kindred A, DO  metoprolol tartrate (LOPRESSOR) 50 MG tablet Take 1 tablet by mouth daily. 09/24/20   [provider]  Multiple Vitamin (MULTIVITAMIN WITH MINERALS) TABS tablet Take 1 tablet by mouth daily.    [provider]  nitroGLYCERIN (NITROSTAT) 0.4 MG SL tablet Place 1 tablet (0.4 mg total) under the tongue every 5 (five) minutes as needed for chest pain. 03/20/20   Nicole Kindred A, DO  pantoprazole (PROTONIX) 40 MG tablet Take 40 mg by mouth daily. 08/25/20   [provider]  ranolazine (RANEXA) 500 MG 12 hr tablet Take 1 tablet by mouth in the morning and at bedtime. 11/19/20 11/19/21  [provider]  rosuvastatin (CRESTOR) 40 MG tablet Take 40 mg by mouth daily.    [provider]  saccharomyces boulardii (FLORASTOR) 250 MG capsule Take 250 mg by mouth 2 (two) times daily.    [provider]  sitaGLIPtin (JANUVIA) 100 MG tablet Take 100 mg by mouth daily.  [provider]  spironolactone (ALDACTONE) 25 MG tablet Take 1 tablet by mouth daily.    [provider]  STIOLTO RESPIMAT 2.5-2.5 MCG/ACT AERS SMARTSIG:2 Puff(s) Via Inhaler Daily 08/15/20   [provider]  torsemide (DEMADEX) 20 MG tablet Take 40mg  (2 tablets) in the AM and 20mg  (1 tablet) in the PM 10/07/20   Alisa Graff, FNP    Physical Exam: Vitals:   01/09/21 1700 01/09/21 1702 01/09/21 1710 01/09/21 1730  BP: (!) 119/102 112/80 108/71 91/62  Pulse: (!) 126 (!) 130 (!) 130 (!) 125  Resp: (!) 24 (!) 25 (!) 36 (!) 35  Temp:      TempSrc:      SpO2: 100% 100% 100% 100%  Weight:      Height:       General: Not in acute distress HEENT:       Eyes: PERRL, EOMI, no scleral icterus.       ENT: No discharge from the ears and nose, no pharynx injection, no tonsillar enlargement.        Neck: No JVD, no bruit, no mass felt. Heme: No neck lymph node enlargement. Cardiac: S1/S2, RRR, No murmurs, No gallops  or rubs. Respiratory: No rales, wheezing, rhonchi or rubs. GI: Soft, nondistended, nontender, no rebound pain, no organomegaly, BS present. GU: No hematuria Ext: has trace leg edema bilaterally.12+DP/PT pulse bilaterally. Musculoskeletal: No joint deformities, No joint redness or warmth, no limitation of ROM in spin. Skin: No rashes.  Neuro: Alert, oriented X3, cranial nerves II-XII grossly intact, moves all extremities normally. Psych: Patient is not psychotic, no suicidal or hemocidal ideation.  Labs on Admission: I have personally reviewed following labs and imaging studies  CBC: Recent Labs  Lab 01/09/21 0813  WBC 9.5  HGB 10.1*  HCT 32.2*  MCV 80.5  PLT 161*   Basic Metabolic Panel: Recent Labs  Lab 01/09/21 0813  NA 136  K 4.7  CL 100  CO2 21*  GLUCOSE 297*  BUN 50*  CREATININE 4.33*  CALCIUM 8.9   GFR: Estimated Creatinine Clearance: 11.4 mL/min (A) (by C-G formula based on SCr of 4.33 mg/dL (H)). Liver Function Tests: No results for input(s): AST, ALT, ALKPHOS, BILITOT, PROT, ALBUMIN in the last 168 hours. No results for input(s): LIPASE, AMYLASE in the last 168 hours. No results for input(s): AMMONIA in the last 168 hours. Coagulation Profile: Recent Labs  Lab 01/09/21 1113  INR 1.0   Cardiac Enzymes: No results for input(s): CKTOTAL, CKMB, CKMBINDEX, TROPONINI in the last 168 hours. BNP (last 3 results) No results for input(s): PROBNP in the last 8760 hours. HbA1C: No results for input(s): HGBA1C in the last 72 hours. CBG: Recent Labs  Lab 01/09/21 1712  GLUCAP 334*   Lipid Profile: No results for input(s): CHOL, HDL, LDLCALC, TRIG, CHOLHDL, LDLDIRECT in the last 72 hours. Thyroid Function Tests: No results for input(s): TSH, T4TOTAL, FREET4, T3FREE, THYROIDAB in the last 72 hours. Anemia Panel: No results for input(s): VITAMINB12, FOLATE, FERRITIN, TIBC, IRON, RETICCTPCT in the last 72 hours. Urine analysis:    Component Value Date/Time    COLORURINE YELLOW (A) 03/31/2020 1457   APPEARANCEUR HAZY (A) 03/31/2020 1457   LABSPEC 1.015 03/31/2020 1457   PHURINE 5.0 03/31/2020 1457   GLUCOSEU NEGATIVE 03/31/2020 1457   HGBUR NEGATIVE 03/31/2020 1457   BILIRUBINUR NEGATIVE 03/31/2020 [place] 03/31/2020 1457   PROTEINUR 100 (A) 03/31/2020 1457   NITRITE NEGATIVE 03/31/2020 1457   LEUKOCYTESUR  SMALL (A) 03/31/2020 1457   Sepsis Labs: @LABRCNTIP (procalcitonin:4,lacticidven:4) ) Recent Results (from the past 240 hour(s))  Resp Panel by RT-PCR (Flu A&B, Covid) Nasopharyngeal Swab     Status: None   Collection Time: 01/09/21  9:55 AM   Specimen: Nasopharyngeal Swab; Nasopharyngeal(NP) swabs in vial transport medium  Result Value Ref Range Status   SARS Coronavirus 2 by RT PCR NEGATIVE NEGATIVE Final    Comment: (NOTE) SARS-CoV-2 target nucleic acids are NOT DETECTED.  The SARS-CoV-2 RNA is generally detectable in upper respiratory specimens during the acute phase of infection. The lowest concentration of SARS-CoV-2 viral copies this assay can detect is 138 copies/mL. A negative result does not preclude SARS-Cov-2 infection and should not be used as the sole basis for treatment or other patient management decisions. A negative result may occur with  improper specimen collection/handling, submission of specimen other than nasopharyngeal swab, presence of viral mutation(s) within the areas targeted by this assay, and inadequate number of viral copies(<138 copies/mL). A negative result must be combined with clinical observations, patient history, and epidemiological information. The expected result is Negative.  Fact Sheet for Patients:  EntrepreneurPulse.com.au  Fact Sheet for Healthcare Providers:  IncredibleEmployment.be  This test is no t yet approved or cleared by the [place] FDA and  has been authorized for detection and/or diagnosis of SARS-CoV-2 by FDA  under an Emergency Use Authorization (EUA). This EUA will remain  in effect (meaning this test can be used) for the duration of the COVID-19 declaration under Section 564(b)(1) of the Act, 21 U.S.C.section 360bbb-3(b)(1), unless the authorization is terminated  or revoked sooner.       Influenza A by PCR NEGATIVE NEGATIVE Final   Influenza B by PCR NEGATIVE NEGATIVE Final    Comment: (NOTE) The Xpert Xpress SARS-CoV-2/FLU/RSV plus assay is intended as an aid in the diagnosis of influenza from Nasopharyngeal swab specimens and should not be used as a sole basis for treatment. Nasal washings and aspirates are unacceptable for Xpert Xpress SARS-CoV-2/FLU/RSV testing.  Fact Sheet for Patients: EntrepreneurPulse.com.au  Fact Sheet for Healthcare Providers: IncredibleEmployment.be  This test is not yet approved or cleared by the [place] FDA and has been authorized for detection and/or diagnosis of SARS-CoV-2 by FDA under an Emergency Use Authorization (EUA). This EUA will remain in effect (meaning this test can be used) for the duration of the COVID-19 declaration under Section 564(b)(1) of the Act, 21 U.S.C. section 360bbb-3(b)(1), unless the authorization is terminated or revoked.  Performed at [facility], [address], [place], [place] [address]      Radiological Exams on Admission: DG Chest 2 View  Result Date: 01/09/2021 CLINICAL DATA:  80 year old female with shortness of breath. EXAM: CHEST - 2 VIEW COMPARISON:  Radiographs 09/20/2020 and earlier. FINDINGS: Calcified aortic atherosclerosis. Cardiac size is at the upper limits of normal. Other mediastinal contours are within normal limits. Visualized tracheal air column is within normal limits. Chronic but diffusely increased pulmonary interstitium when compared to 09/20/2020 PA and lateral study. No pneumothorax, layering pleural effusion or consolidation. There does  appear to be trace pleural fluid in the fissures now. Osteopenia. No acute osseous abnormality identified. Negative visible bowel gas pattern. IMPRESSION: 1. Acute on chronic interstitial opacity with suspected trace fluid in the fissures. Favor acute interstitial edema over viral/atypical respiratory infection. 2.  Aortic Atherosclerosis (ICD10-I70.0). Electronically Signed   By: Genevie Ann M.D.   On: 01/09/2021 08:39   NM Pulmonary Perfusion  Result Date:  01/09/2021 CLINICAL DATA:  Chest pressure and shortness of breath today. EXAM: NUCLEAR MEDICINE PERFUSION LUNG SCAN TECHNIQUE: Perfusion images were obtained in multiple projections after intravenous injection of radiopharmaceutical. Ventilation scans intentionally deferred if perfusion scan and chest x-ray adequate for interpretation during COVID 19 epidemic. RADIOPHARMACEUTICALS:  4.13 mCi Tc-77m MAA IV COMPARISON:  PA and lateral chest today. FINDINGS: Perfusion appears normal without segmental or subsegmental defect. IMPRESSION: Negative for PE. Electronically Signed   By: Inge Rise M.D.   On: 01/09/2021 13:32   DG Chest Portable 1 View  Result Date: 01/09/2021 CLINICAL DATA:  Hypoxemia EXAM: PORTABLE CHEST 1 VIEW COMPARISON:  Portable exam 1645 hours compared to 0816 hours FINDINGS: Minimal enlargement of cardiac silhouette. Atherosclerotic calcification aorta. Diffuse interstitial infiltrates throughout both lungs increased from earlier study of same date, favor pulmonary edema due to rapidity of change and distribution. Infection is not completely excluded but is felt to be less likely. No pleural effusion or pneumothorax. IMPRESSION: Significant increase in diffuse interstitial infiltrates bilaterally since earlier exam of 01/09/2021 favoring pulmonary edema. Electronically Signed   By: Lavonia Dana M.D.   On: 01/09/2021 16:57   ECHOCARDIOGRAM COMPLETE  Result Date: 01/09/2021    ECHOCARDIOGRAM REPORT   Patient Name:   Brittney [name] Date  of Exam: 01/09/2021 Medical Rec #:  [id number]        Height:       66.0 in Accession #:    [id number]       Weight:       187.3 lb Date of Birth:  [birth year]       BSA:          1.945 m Patient Age:    14 years         BP:           131/98 mmHg Patient Gender: F                HR:           92 bpm. Exam Location:  [facility] Procedure: 2D Echo, Color Doppler, Cardiac Doppler and Intracardiac            Opacification Agent Indications:     I21.4 NSTEMI  History:         Patient has prior history of Echocardiogram examinations, most                  recent 03/19/2020. CKD; Risk Factors:Hypertension, Diabetes and                  Dyslipidemia.  Sonographer:     Charmayne Sheer RDCS (AE) Referring Phys:  [id number] Cynai Skeens Diagnosing Phys: Bartholome Bill MD IMPRESSIONS  1. Left ventricular ejection fraction, by estimation, is 60 to 65%. The left ventricle has normal function. The left ventricle has no regional wall motion abnormalities. There is mild left ventricular hypertrophy. Left ventricular diastolic parameters are consistent with Grade I diastolic dysfunction (impaired relaxation).  2. Right ventricular systolic function is normal. The right ventricular size is normal.  3. The mitral valve is grossly normal. Mild mitral valve regurgitation.  4. The aortic valve is grossly normal. Aortic valve regurgitation is trivial. FINDINGS  Left Ventricle: Left ventricular ejection fraction, by estimation, is 60 to 65%. The left ventricle has normal function. The left ventricle has no regional wall motion abnormalities. Definity contrast agent was given IV to delineate the left ventricular  endocardial borders. The left ventricular internal cavity size was normal in  size. There is mild left ventricular hypertrophy. Left ventricular diastolic parameters are consistent with Grade I diastolic dysfunction (impaired relaxation). Right Ventricle: The right ventricular size is normal. No increase in right ventricular wall thickness. Right ventricular  systolic function is normal. Left Atrium: Left atrial size was normal in size. Right Atrium: Right atrial size was normal in size. Pericardium: There is no evidence of pericardial effusion. Mitral Valve: The mitral valve is grossly normal. Mild mitral valve regurgitation. MV peak gradient, 10.5 mmHg. The mean mitral valve gradient is 4.0 mmHg. Tricuspid Valve: The tricuspid valve is grossly normal. Tricuspid valve regurgitation is trivial. Aortic Valve: The aortic valve is grossly normal. Aortic valve regurgitation is trivial. Aortic valve mean gradient measures 4.0 mmHg. Aortic valve peak gradient measures 6.9 mmHg. Aortic valve area, by VTI measures 2.57 cm. Pulmonic Valve: The pulmonic valve was not well visualized. Pulmonic valve regurgitation is trivial. Aorta: The aortic root is normal in size and structure. IAS/Shunts: The interatrial septum was not well visualized.  LEFT VENTRICLE PLAX 2D LVIDd:         4.93 cm     Diastology LVIDs:         3.69 cm     LV e' medial:    4.68 cm/s LV PW:         1.00 cm     LV E/e' medial:  25.6 LV IVS:        0.78 cm     LV e' lateral:   9.14 cm/s LVOT diam:     2.10 cm     LV E/e' lateral: 13.1 LV SV:         63 LV SV Index:   33 LVOT Area:     3.46 cm  LV Volumes (MOD) LV vol d, MOD A2C: 94.0 ml LV vol d, MOD A4C: 71.6 ml LV vol s, MOD A2C: 41.5 ml LV vol s, MOD A4C: 17.2 ml LV SV MOD A2C:     52.5 ml LV SV MOD A4C:     71.6 ml LV SV MOD BP:      58.0 ml RIGHT VENTRICLE RV Basal diam:  2.23 cm LEFT ATRIUM             Index       RIGHT ATRIUM          Index LA diam:        3.50 cm 1.80 cm/m  RA Area:     5.88 cm LA Vol (A2C):   43.8 ml 22.52 ml/m RA Volume:   7.50 ml  3.86 ml/m LA Vol (A4C):   47.7 ml 24.53 ml/m LA Biplane Vol: 46.2 ml 23.76 ml/m  AORTIC VALVE                   PULMONIC VALVE AV Area (Vmax):    2.42 cm    PV Vmax:       1.09 m/s AV Area (Vmean):   2.56 cm    PV Vmean:      69.600 cm/s AV Area (VTI):     2.57 cm    PV VTI:        0.175 m AV Vmax:            131.00 cm/s PV Peak grad:  4.8 mmHg AV Vmean:          87.800 cm/s PV Mean grad:  2.0 mmHg AV VTI:  0.247 m AV Peak Grad:      6.9 mmHg AV Mean Grad:      4.0 mmHg LVOT Vmax:         91.70 cm/s LVOT Vmean:        64.800 cm/s LVOT VTI:          0.183 m LVOT/AV VTI ratio: 0.74  AORTA Ao Root diam: 3.00 cm MITRAL VALVE MV Area (PHT): 5.27 cm     SHUNTS MV Area VTI:   2.28 cm     Systemic VTI:  0.18 m MV Peak grad:  10.5 mmHg    Systemic Diam: 2.10 cm MV Mean grad:  4.0 mmHg MV Vmax:       1.62 m/s MV Vmean:      97.9 cm/s MV Decel Time: 144 msec MV E velocity: 120.00 cm/s MV A velocity: 154.00 cm/s MV E/A ratio:  0.78 Bartholome Bill MD Electronically signed by Bartholome Bill MD Signature Date/Time: 01/09/2021/3:36:24 PM    Final      EKG: I have personally reviewed.  Sinus rhythm, QTC 456, LAE, ST depression in lead I/aVL precordial leads  Assessment/Plan Principal Problem:   NSTEMI (non-ST elevated myocardial infarction) ([place]) Active Problems:   Benign essential HTN   Type II diabetes mellitus with renal manifestations (HCC)   Hyperlipidemia   CAD (coronary artery disease)   Anemia in chronic kidney disease   Acute renal failure superimposed on stage 4 chronic kidney disease (HCC)   Chronic diastolic CHF (congestive heart failure) (HCC)  NSTEMI (non-ST elevated myocardial infarction) and hx of CAD: trop 186 -->1020. Dr. Ubaldo Glassing of card is consulted. Due to worsening renal Fx, cannot do cardiac cath. V/Q scan negative for PE  - admit to progressive unit as inpatient - IV heparin - Trend Trop - prn Nitroglycerin, Morphine, and aspirin, lipitor, Imdur, metoprolol, Ranexa - Risk factor stratification: will check FLP and A1C  - 2d echo  Benign essential HTN -Metoprolol, amlodipine, hydralazine -Hold Cozaar due to worsening renal function -IV hydralazine as needed  Type II diabetes mellitus with renal manifestations [facility]): Recent A1c 10.5, poorly controlled.  Patient is taking  metformin, Jardiance, glipizide and Lantus. -Decrease Lantus dose from 16 to 12 units daily -Sliding scale insulin  Hyperlipidemia -Lipitor  Anemia in chronic kidney disease: Hgb stable, 10.1 -f/u by CBC  Acute renal failure superimposed on stage 4 chronic kidney disease ([place]): Baseline creatinine 2.83 on 10/14/2020.  His creatinine is a 4.33, BUN 50. -Dr. Jay Schlichter of renal was consulted -Hold torsemide, Cozaar, Similac total  Chronic diastolic CHF (congestive heart failure) ([place]): 2D echo 03/19/2020 showed EF of 60 to 35% with grade 1 diastolic dysfunction.  Patient has trace leg edema, BNP 176, chest x-ray showed interstitial edema.  Initially patient was doing fine, but after giving IV fluid due to worsening renal function, patient developed respiratory distress.  BiPAP started.  The repeated chest that showed worsening interstitial edema. -will give 1 dose of Lasix 40 mg -Continue BiPAP    DVT ppx: on IV Heparin   Code Status: Full code Family Communication:   Yes, patient's granddaughter by phone Disposition Plan:  Anticipate discharge back to previous environment Consults called:  Dr. Ubaldo Glassing of card Admission status:  progressive unit for as inpt        Status is: Inpatient  Remains inpatient appropriate because:Inpatient level of care appropriate due to severity of illness.  Patient has multiple comorbidities, now presents with chest pain with non-STEMI, elevated troponin, patient  also has worsening renal function.  Her presentation is highly complicated.  Patient is at high risk of deteriorating given her old age.  Patient will need to be treated in hospital for the least 2 days   Dispo: The patient is from: Home              Anticipated d/c is to: Home              Anticipated d/c date is: 2 days              Patient currently is not medically stable to d/c.         Date of Service 01/09/2021    [facility]   If 7PM-7AM, please contact  night-coverage [email or web address] 01/09/2021, 5:46 PM

## 2021-01-09 NOTE — ED Notes (Signed)
Admitting provider at bedside.

## 2021-01-09 NOTE — Consult Note (Signed)
ANTICOAGULATION CONSULT NOTE  Pharmacy Consult for Heparin Infusion Indication: chest pain/ACS  Patient Measurements: Heparin Dosing Weight: 75.7 kg  Labs: Recent Labs    01/09/21 0813 01/09/21 0955 01/09/21 1113 01/09/21 1426 01/09/21 1715 01/09/21 2210  HGB 10.1*  --   --   --   --   --   HCT 32.2*  --   --   --   --   --   PLT 571*  --   --   --   --   --   APTT  --   --  25  --   --   --   LABPROT  --   --  12.5  --   --   --   INR  --   --  1.0  --   --   --   HEPARINUNFRC  --   --   --   --   --  0.13*  CREATININE 4.33*  --   --   --   --   --   TROPONINIHS 186*   < >  --  5,201* 5,528* 13,582*   < > = values in this interval not displayed.    Estimated Creatinine Clearance: 11.4 mL/min (A) (by C-G formula based on SCr of 4.33 mg/dL (H)).   Medical History: Past Medical History:  Diagnosis Date  . Aortic valve stenosis   . Carotid bruit   . CHF (congestive heart failure) ([place])   . Chronic kidney disease   . Diabetes mellitus without complication ([place])   . GERD (gastroesophageal reflux disease)   . Heart murmur   . Hyperlipidemia   . Hypertension   . Pneumonia   . Vitamin D deficiency     Medications:  No anticoagulation prior to admission per my chart review  Assessment: Patient is an 80 y/o F with medical history as above and including three-vessel CAD who presented to the ED 1/14 with chest pain. Troponin 186 >> 1020. Pharmacy has been consulted to initiate heparin infusion for ACS.  Baseline CBC notable for Hgb 10.1, platelets 571. Baseline aPTT and PT-INR pending.   Goal of Therapy:  Heparin level 0.3-0.7 units/ml Monitor platelets by anticoagulation protocol: Yes   Plan:  --Heparin 4000 unit IV bolus x 1 followed by continuous infusion at 900 units/hr --Heparin level 8 hours after initiation of infusion --Daily CBC per protocol while on heparin infusion   0114 2210 HL 0.13, SUBtherapeutic.  Will rebolus w/ 2000 units x 1 and increase  heparin drip to 1100 units/hr, recheck HL in ~ 8 hrs.  Monitor CBC  Hart Robinsons A 01/09/2021,11:36 PM

## 2021-01-09 NOTE — ED Provider Notes (Signed)
[facility] Emergency Department Provider Note   ____________________________________________   Event Date/Time   First MD Initiated Contact with Patient 01/09/21 0932     (approximate)  I have reviewed the triage vital signs and the nursing notes.   HISTORY  Chief Complaint Chest Pain and Shortness of Breath    HPI Brittney [name] is a 80 y.o. female with possible history of hypertension, hyperlipidemia, diabetes, CAD, diastolic CHF, and CKD who presents to the ED for chest pain and shortness of breath.  Patient reports that last night she developed a pressure in the left side of her chest.  Pain has been constant since then, not exacerbated or alleviated by anything.  She has developed difficulty breathing with the pain, but denies any fevers, cough, pain or swelling in her legs.  She has felt nauseous but has not vomited and denies any diarrhea.  She is not aware of any sick contacts and is vaccinated against COVID-19.        Past Medical History:  Diagnosis Date  . Aortic valve stenosis   . Carotid bruit   . CHF (congestive heart failure) ([place])   . Chronic kidney disease   . Diabetes mellitus without complication ([place])   . GERD (gastroesophageal reflux disease)   . Heart murmur   . Hyperlipidemia   . Hypertension   . Pneumonia   . Vitamin D deficiency     Patient Active Problem List   Diagnosis Date Noted  . NSTEMI (non-ST elevated myocardial infarction) ([place]) 01/09/2021  . Chronic diastolic CHF (congestive heart failure) ([place]) 01/09/2021  . Acute renal failure superimposed on stage 4 chronic kidney disease ([place]) 11/25/2020  . Abdominal pain 03/31/2020  . Chest pain 03/19/2020  . Acute renal failure superimposed on stage 3b chronic kidney disease ([place]) 03/19/2020  . CAD (coronary artery disease) 03/19/2020  . Chronic systolic CHF (congestive heart failure) ([place]) 03/19/2020  . Leukocytosis 03/19/2020  . Anemia in chronic kidney disease  02/26/2020  . Benign hypertensive kidney disease with chronic kidney disease 02/26/2020  . Congestive heart failure ([address]) 02/26/2020  . Hypokalemia 02/26/2020  . Secondary hyperparathyroidism of renal origin ([address]) 02/26/2020  . Stage 3a chronic kidney disease ([place]) 02/26/2020  . Acute exacerbation of CHF (congestive heart failure) ([employer]) 02/01/2020  . Elevated troponin 02/01/2020  . Reflux esophagitis   . CLE (columnar lined esophagus)   . Duodenitis 06/25/2019  . Chronic heart failure with preserved ejection fraction (HFpEF) ([place]) 02/26/2019  . Benign essential HTN 01/17/2019  . Type II diabetes mellitus with renal manifestations ([place]) 07/24/2015  . GERD without esophagitis 07/24/2015  . Hyperlipidemia 07/24/2015    Past Surgical History:  Procedure Laterality Date  . COLONOSCOPY    . COLONOSCOPY WITH PROPOFOL N/A 08/02/2019   Procedure: COLONOSCOPY WITH PROPOFOL;  Surgeon: Virgel Manifold, MD;  Location: [facility] ENDOSCOPY;  Service: Endoscopy;  Laterality: N/A;  . CORONARY ANGIOPLASTY    . ESOPHAGOGASTRODUODENOSCOPY N/A 06/26/2019   Procedure: ESOPHAGOGASTRODUODENOSCOPY (EGD);  Surgeon: Virgel Manifold, MD;  Location: [facility] ENDOSCOPY;  Service: Endoscopy;  Laterality: N/A;  . ESOPHAGOGASTRODUODENOSCOPY (EGD) WITH PROPOFOL N/A 08/02/2019   Procedure: ESOPHAGOGASTRODUODENOSCOPY (EGD) WITH PROPOFOL;  Surgeon: Virgel Manifold, MD;  Location: [facility] ENDOSCOPY;  Service: Endoscopy;  Laterality: N/A;  . ESOPHAGOGASTRODUODENOSCOPY (EGD) WITH PROPOFOL N/A 04/03/2020   Procedure: ESOPHAGOGASTRODUODENOSCOPY (EGD) WITH PROPOFOL;  Surgeon: [name], MD;  Location: [facility] ENDOSCOPY;  Service: Gastroenterology;  Laterality: N/A;  . LEFT HEART CATH AND CORONARY ANGIOGRAPHY N/A  01/23/2019   Procedure: LEFT HEART CATH AND CORONARY ANGIOGRAPHY;  Surgeon: Yolonda Kida, MD;  Location: [facility] LAB;  Service: Cardiovascular;  Laterality: N/A;  . ovarian cyst removal      Prior to  Admission medications   Medication Sig Start Date End Date Taking? Authorizing Provider  amLODipine (NORVASC) 10 MG tablet Take 1 tablet by mouth daily.   Yes [provider]  aspirin EC 81 MG EC tablet Take 1 tablet (81 mg total) by mouth daily. 06/27/19  Yes Gouru, Illene Silver, MD  atorvastatin (LIPITOR) 40 MG tablet Take 40 mg by mouth daily.  07/04/18  Yes [provider]  calcitRIOL (ROCALTROL) 0.25 MCG capsule Take 0.25 mcg by mouth daily. 11/26/20  Yes [provider]  cetirizine (ZYRTEC) 10 MG tablet Take 10 mg by mouth every other day.    Yes [provider]  Choline Fenofibrate 45 MG capsule Take 45 mg by mouth daily.    Yes [provider]  glipiZIDE (GLUCOTROL XL) 10 MG 24 hr tablet Take 10 mg by mouth 2 (two) times daily.   Yes [provider]  hydrALAZINE (APRESOLINE) 25 MG tablet Take 25 mg by mouth 2 (two) times daily. 08/25/20  Yes [provider]  isosorbide mononitrate (IMDUR) 60 MG 24 hr tablet Take 1 tablet by mouth daily. 11/19/20  Yes [provider]  LANTUS SOLOSTAR 100 UNIT/ML Solostar Pen Inject 16 Units into the skin at bedtime. 10/19/19  Yes [provider]  losartan (COZAAR) 50 MG tablet Take 50 mg by mouth daily. 12/24/20  Yes [provider]  metFORMIN (GLUCOPHAGE) 1000 MG tablet Take 1,000 mg by mouth 2 (two) times daily with a meal. 09/24/20  Yes [provider]  metoprolol succinate (TOPROL-XL) 100 MG 24 hr tablet Take 1 tablet (100 mg total) by mouth daily. Take with or immediately following a meal. 09/22/20  Yes Nicole Kindred A, DO  Multiple Vitamin (MULTIVITAMIN WITH MINERALS) TABS tablet Take 1 tablet by mouth daily.   Yes [provider]  ranolazine (RANEXA) 500 MG 12 hr tablet Take 1 tablet by mouth in the morning and at bedtime. 11/19/20 11/19/21 Yes [provider]  torsemide (DEMADEX) 20 MG tablet Take 40mg  (2 tablets) in the AM and 20mg  (1 tablet) in  the PM 10/07/20  Yes Hackney, Otila Kluver A, FNP  albuterol (VENTOLIN HFA) 108 (90 Base) MCG/ACT inhaler Inhale 2 puffs into the lungs every 6 (six) hours as needed for wheezing.    [provider]  clopidogrel (PLAVIX) 75 MG tablet Take 1 tablet (75 mg total) by mouth daily. Patient not taking: No sig reported 09/22/20   Nicole Kindred A, DO  nitroGLYCERIN (NITROSTAT) 0.4 MG SL tablet Place 1 tablet (0.4 mg total) under the tongue every 5 (five) minutes as needed for chest pain. 03/20/20   Nicole Kindred A, DO  pantoprazole (PROTONIX) 40 MG tablet Take 40 mg by mouth daily. Patient not taking: No sig reported 08/25/20   [provider]  rosuvastatin (CRESTOR) 40 MG tablet Take 40 mg by mouth daily. Patient not taking: No sig reported    [provider]  saccharomyces boulardii (FLORASTOR) 250 MG capsule Take 250 mg by mouth 2 (two) times daily. Patient not taking: No sig reported    [provider]  sitaGLIPtin (JANUVIA) 100 MG tablet Take 100 mg by mouth daily. Patient not taking: No sig reported    [provider]  spironolactone (ALDACTONE) 25 MG tablet  Take 1 tablet by mouth daily. Patient not taking: No sig reported    [provider]  STIOLTO RESPIMAT 2.5-2.5 MCG/ACT AERS SMARTSIG:2 Puff(s) Via Inhaler Daily Patient not taking: No sig reported 08/15/20   [provider]    Allergies Accupril [quinapril hcl]  Family History  Problem Relation Age of Onset  . Diabetes Neg Hx   . Hypertension Neg Hx   . Breast cancer Neg Hx     Social History Social History   Tobacco Use  . Smoking status: Former Smoker    Packs/day: 1.00    Years: 40.00    Pack years: 40.00    Types: Cigarettes    Quit date: 07/27/2018    Years since quitting: 2.4  . Smokeless tobacco: Never Used  Vaping Use  . Vaping Use: Never used  Substance Use Topics  . Alcohol use: No  . Drug use: No    Review of Systems  Constitutional: No  fever/chills Eyes: No visual changes. ENT: No sore throat. Cardiovascular: Positive for chest pain. Respiratory: Positive for shortness of breath. Gastrointestinal: No abdominal pain.  Positive for nausea, no vomiting.  No diarrhea.  No constipation. Genitourinary: Negative for dysuria. Musculoskeletal: Negative for back pain. Skin: Negative for rash. Neurological: Negative for headaches, focal weakness or numbness.  ____________________________________________   PHYSICAL EXAM:  VITAL SIGNS: ED Triage Vitals  Enc Vitals Group     BP 01/09/21 0806 (!) 160/70     Pulse Rate 01/09/21 0806 (!) 115     Resp 01/09/21 0806 (!) 28     Temp 01/09/21 0806 98.1 F (36.7 C)     Temp Source 01/09/21 0806 Oral     SpO2 01/09/21 0806 93 %     Weight 01/09/21 0807 175 lb (79.4 kg)     Height 01/09/21 0807 5\' 6"  (1.676 m)     Head Circumference --      Peak Flow --      Pain Score --      Pain Loc --      Pain Edu? --      Excl. in [place]? --     Constitutional: Alert and oriented. Eyes: Conjunctivae are normal. Head: Atraumatic. Nose: No congestion/rhinnorhea. Mouth/Throat: Mucous membranes are moist. Neck: Normal ROM Cardiovascular: Tachycardic, regular rhythm. Grossly normal heart sounds. Respiratory: Tachypneic with increased respiratory effort.  No retractions. Lungs CTAB. Gastrointestinal: Soft and nontender. No distention. Genitourinary: deferred Musculoskeletal: No lower extremity tenderness nor edema. Neurologic:  Normal speech and language. No gross focal neurologic deficits are appreciated. Skin:  Skin is warm, dry and intact. No rash noted. Psychiatric: Mood and affect are normal. Speech and behavior are normal.  ____________________________________________   LABS (all labs ordered are listed, but only abnormal results are displayed)  Labs Reviewed  BASIC METABOLIC PANEL - Abnormal; Notable for the following components:      Result Value   CO2 21 (*)    Glucose, Bld  297 (*)    BUN 50 (*)    Creatinine, Ser 4.33 (*)    GFR, Estimated 10 (*)    All other components within normal limits  CBC - Abnormal; Notable for the following components:   Hemoglobin 10.1 (*)    HCT 32.2 (*)    MCH 25.3 (*)    RDW 16.3 (*)    Platelets 571 (*)    All other components within normal limits  BRAIN NATRIURETIC PEPTIDE - Abnormal; Notable for the following components:   B  Natriuretic Peptide 176.3 (*)    All other components within normal limits  TROPONIN I (HIGH SENSITIVITY) - Abnormal; Notable for the following components:   Troponin I (High Sensitivity) 186 (*)    All other components within normal limits  TROPONIN I (HIGH SENSITIVITY) - Abnormal; Notable for the following components:   Troponin I (High Sensitivity) 1,020 (*)    All other components within normal limits  TROPONIN I (HIGH SENSITIVITY) - Abnormal; Notable for the following components:   Troponin I (High Sensitivity) 5,201 (*)    All other components within normal limits  RESP PANEL BY RT-PCR (FLU A&B, COVID) ARPGX2  APTT  PROTIME-INR  HEPARIN LEVEL (UNFRACTIONATED)  TROPONIN I (HIGH SENSITIVITY)   ____________________________________________  EKG  ED ECG REPORT I, Blake Divine, the attending physician, personally viewed and interpreted this ECG.   Date: 01/09/2021  EKG Time: 8:02  Rate: 111  Rhythm: sinus tachycardia  Axis: Normal  Intervals:none  ST&T Change: Nonspecific ST/T wave changes   PROCEDURES  Procedure(s) performed (including Critical Care):  .Critical Care Performed by: Blake Divine, MD Authorized by: Blake Divine, MD   Critical care provider statement:    Critical care time (minutes):  45   Critical care time was exclusive of:  Separately billable procedures and treating other patients and teaching time   Critical care was necessary to treat or prevent imminent or life-threatening deterioration of the following conditions:  Cardiac failure   Critical  care was time spent personally by me on the following activities:  Discussions with consultants, evaluation of patient's response to treatment, examination of patient, ordering and performing treatments and interventions, ordering and review of laboratory studies, ordering and review of radiographic studies, pulse oximetry, re-evaluation of patient's condition, obtaining history from patient or surrogate and review of old charts   I assumed direction of critical care for this patient from another provider in my specialty: no       ____________________________________________   INITIAL IMPRESSION / ASSESSMENT AND PLAN / ED COURSE       80 year old female with a past medical history of hypertension, hyperlipidemia, diabetes, CAD, diastolic CHF, and CKD who presents to the ED for left-sided chest pressure with difficulty breathing starting last night.  Patient is tachypneic but maintaining O2 sats on room air, does complain of ongoing chest pain.  EKG shows nonspecific ST/T changes and troponin is elevated to 186.  This does appear improved from her prior admission and differential at this point would include ACS, PE, COVID-19.  We will further assess with VQ scan given her AKI on chronic kidney disease, also trend troponin.  We will treat her ongoing chest pain with nitroglycerin, patient received aspirin loading dose at home.  Plan to discuss with cardiology and hospitalist for admission.  VQ scan is negative for PE, repeat troponin with significant elevation from previous and we will start patient on heparin for ACS.  She continues to deny chest pain at this time.  Cardiology was consulted and case discussed with hospitalist for admission.      ____________________________________________   FINAL CLINICAL IMPRESSION(S) / ED DIAGNOSES  Final diagnoses:  NSTEMI (non-ST elevated myocardial infarction) ([place])  Shortness of breath     ED Discharge Orders    None       Note:  This  document was prepared using Dragon voice recognition software and may include unintentional dictation errors.   Blake Divine, MD 01/09/21 [phone]

## 2021-01-10 ENCOUNTER — Encounter: Payer: Self-pay | Admitting: Internal Medicine

## 2021-01-10 LAB — BASIC METABOLIC PANEL
Anion gap: 11 (ref 5–15)
BUN: 49 mg/dL — ABNORMAL HIGH (ref 8–23)
CO2: 24 mmol/L (ref 22–32)
Calcium: 8.8 mg/dL — ABNORMAL LOW (ref 8.9–10.3)
Chloride: 104 mmol/L (ref 98–111)
Creatinine, Ser: 4.06 mg/dL — ABNORMAL HIGH (ref 0.44–1.00)
GFR, Estimated: 11 mL/min — ABNORMAL LOW (ref >60)
Glucose, Bld: 219 mg/dL — ABNORMAL HIGH (ref 70–99)
Potassium: 4.4 mmol/L (ref 3.5–5.1)
Sodium: 139 mmol/L (ref 135–145)

## 2021-01-10 LAB — CBC
HCT: 27.1 % — ABNORMAL LOW (ref 36.0–46.0)
Hemoglobin: 8.4 g/dL — ABNORMAL LOW (ref 12.0–15.0)
MCH: 24.7 pg — ABNORMAL LOW (ref 26.0–34.0)
MCHC: 31 g/dL (ref 30.0–36.0)
MCV: 79.7 fL — ABNORMAL LOW (ref 80.0–100.0)
Platelets: 517 10*3/uL — ABNORMAL HIGH (ref 150–400)
RBC: 3.4 MIL/uL — ABNORMAL LOW (ref 3.87–5.11)
RDW: 16.4 % — ABNORMAL HIGH (ref 11.5–15.5)
WBC: 9 10*3/uL (ref 4.0–10.5)
nRBC: 0 % (ref 0.0–0.2)

## 2021-01-10 LAB — HEPARIN LEVEL (UNFRACTIONATED)
Heparin Unfractionated: 0.27 IU/mL — ABNORMAL LOW (ref 0.30–0.70)
Heparin Unfractionated: 0.44 IU/mL (ref 0.30–0.70)

## 2021-01-10 LAB — CBG MONITORING, ED
Glucose-Capillary: 194 mg/dL — ABNORMAL HIGH (ref 70–99)
Glucose-Capillary: 285 mg/dL — ABNORMAL HIGH (ref 70–99)

## 2021-01-10 LAB — LIPID PANEL
Cholesterol: 145 mg/dL (ref 0–200)
HDL: 31 mg/dL — ABNORMAL LOW (ref >40)
LDL Cholesterol: 69 mg/dL (ref 0–99)
Total CHOL/HDL Ratio: 4.7 RATIO
Triglycerides: 225 mg/dL — ABNORMAL HIGH (ref <150)
VLDL: 45 mg/dL — ABNORMAL HIGH (ref 0–40)

## 2021-01-10 LAB — HEMOGLOBIN A1C
Hgb A1c MFr Bld: 8.3 % — ABNORMAL HIGH (ref 4.8–5.6)
Mean Plasma Glucose: 191.51 mg/dL

## 2021-01-10 LAB — GLUCOSE, CAPILLARY: Glucose-Capillary: 228 mg/dL — ABNORMAL HIGH (ref 70–99)

## 2021-01-10 MED ORDER — HEPARIN BOLUS VIA INFUSION
1000.0000 [IU] | Freq: Once | INTRAVENOUS | Status: AC
  Administered 2021-01-10: 1000 [IU] via INTRAVENOUS
  Filled 2021-01-10: qty 1000

## 2021-01-10 MED ORDER — RANOLAZINE ER 500 MG PO TB12
1000.0000 mg | ORAL_TABLET | Freq: Two times a day (BID) | ORAL | Status: DC
  Administered 2021-01-10 – 2021-01-16 (×13): 1000 mg via ORAL
  Filled 2021-01-10 (×15): qty 2

## 2021-01-10 MED ORDER — ISOSORBIDE MONONITRATE ER 60 MG PO TB24
120.0000 mg | ORAL_TABLET | Freq: Once | ORAL | Status: DC
  Filled 2021-01-10: qty 2

## 2021-01-10 MED ORDER — RANOLAZINE ER 500 MG PO TB12
1000.0000 mg | ORAL_TABLET | Freq: Two times a day (BID) | ORAL | Status: DC
  Filled 2021-01-10: qty 2

## 2021-01-10 MED ORDER — ISOSORBIDE MONONITRATE ER 60 MG PO TB24
120.0000 mg | ORAL_TABLET | Freq: Every day | ORAL | Status: DC
  Administered 2021-01-11 – 2021-01-16 (×6): 120 mg via ORAL
  Filled 2021-01-10 (×7): qty 2

## 2021-01-10 NOTE — ED Notes (Signed)
Contacted MD regarding downgrading pt as they are stable and off Bipap. New orders received.

## 2021-01-10 NOTE — Plan of Care (Signed)

## 2021-01-10 NOTE — Consult Note (Signed)
ANTICOAGULATION CONSULT NOTE  Pharmacy Consult for Heparin Infusion Indication: chest pain/ACS  Patient Measurements: Heparin Dosing Weight: 75.7 kg  Labs: Recent Labs    01/09/21 0813 01/09/21 0955 01/09/21 1113 01/09/21 1426 01/09/21 1715 01/09/21 2210 01/10/21 0556 01/10/21 1111  HGB 10.1*  --   --   --   --   --   --  8.4*  HCT 32.2*  --   --   --   --   --   --  27.1*  PLT 571*  --   --   --   --   --   --  517*  APTT  --   --  25  --   --   --   --   --   LABPROT  --   --  12.5  --   --   --   --   --   INR  --   --  1.0  --   --   --   --   --   HEPARINUNFRC  --   --   --   --   --  0.13*  --  0.27*  CREATININE 4.33*  --   --   --   --   --  4.06*  --   TROPONINIHS 186*   < >  --  5,201* 5,528* 41,282*  --   --    < > = values in this interval not displayed.    Estimated Creatinine Clearance: 12.1 mL/min (A) (by C-G formula based on SCr of 4.06 mg/dL (H)).   Medical History: Past Medical History:  Diagnosis Date  . Aortic valve stenosis   . Carotid bruit   . CHF (congestive heart failure) ([place])   . Chronic kidney disease   . Diabetes mellitus without complication ([place])   . GERD (gastroesophageal reflux disease)   . Heart murmur   . Hyperlipidemia   . Hypertension   . Pneumonia   . Vitamin D deficiency     Medications:  No anticoagulation prior to admission per my chart review  Assessment: Patient is an 80 y/o F with medical history as above and including three-vessel CAD who presented to the ED 1/14 with chest pain. Troponin 186 >> 1020. Pharmacy has been consulted to initiate heparin infusion for ACS.  Baseline CBC notable for Hgb 10.1, platelets 571. Baseline aPTT and PT-INR pending.   0114 2210 HL 0.13, SUBtherapeutic.  Will rebolus w/ 2000 units x 1 and increase heparin drip to 1100 units/hr,  Goal of Therapy:  Heparin level 0.3-0.7 units/ml Monitor platelets by anticoagulation protocol: Yes   Plan:  --Heparin 4000 unit IV bolus x 1 followed  by continuous infusion at 900 units/hr --Heparin level 8 hours after initiation of infusion --Daily CBC per protocol while on heparin infusion   0115 1111 HL 0.27, SUBtherapeutic.  Will rebolus w/ 1000 units x 1 and increase heparin drip to 1250 units/hr, recheck HL in ~ 8 hrs.  Monitor CBC  Dorsie Burich A 01/10/2021,12:33 PM

## 2021-01-10 NOTE — Consult Note (Signed)
ANTICOAGULATION CONSULT NOTE  Pharmacy Consult for Heparin Infusion Indication: chest pain/ACS  Patient Measurements: Heparin Dosing Weight: 75.7 kg  Labs: Recent Labs    01/09/21 0813 01/09/21 0955 01/09/21 1113 01/09/21 1426 01/09/21 1715 01/09/21 2210 01/10/21 0556 01/10/21 1111 01/10/21 2015  HGB 10.1*  --   --   --   --   --   --  8.4*  --   HCT 32.2*  --   --   --   --   --   --  27.1*  --   PLT 571*  --   --   --   --   --   --  517*  --   APTT  --   --  25  --   --   --   --   --   --   LABPROT  --   --  12.5  --   --   --   --   --   --   INR  --   --  1.0  --   --   --   --   --   --   HEPARINUNFRC  --   --   --   --   --  0.13*  --  0.27* 0.44  CREATININE 4.33*  --   --   --   --   --  4.06*  --   --   TROPONINIHS 186*   < >  --  5,201* 5,528* 68,032*  --   --   --    < > = values in this interval not displayed.    Estimated Creatinine Clearance: 12.4 mL/min (A) (by C-G formula based on SCr of 4.06 mg/dL (H)).   Medical History: Past Medical History:  Diagnosis Date  . Aortic valve stenosis   . Carotid bruit   . CHF (congestive heart failure) ([place])   . Chronic kidney disease   . Diabetes mellitus without complication ([place])   . GERD (gastroesophageal reflux disease)   . Heart murmur   . Hyperlipidemia   . Hypertension   . Pneumonia   . Vitamin D deficiency     Medications:  No anticoagulation prior to admission per my chart review  Assessment: Patient is an 80 y/o F with medical history as above and including three-vessel CAD who presented to the ED 1/14 with chest pain. Troponin 186 >> 1020. Pharmacy has been consulted to initiate heparin infusion for ACS.  Baseline CBC notable for Hgb 10.1, platelets 571. Baseline aPTT and PT-INR pending.   0114 2210 HL 0.13, SUBtherapeutic.  Will rebolus w/ 2000 units x 1 and increase heparin drip to 1100 units/hr,  Goal of Therapy:  Heparin level 0.3-0.7 units/ml Monitor platelets by anticoagulation  protocol: Yes   Plan:  --Heparin 4000 unit IV bolus x 1 followed by continuous infusion at 900 units/hr --Heparin level 8 hours after initiation of infusion --Daily CBC per protocol while on heparin infusion   0115 2015 HL 0.44, therapeutic.  Will continue heparin drip at 1250 units/hr, recheck HL in ~ 8 hrs to confirm.  Monitor CBC  Hart Robinsons A 01/10/2021,10:12 PM

## 2021-01-10 NOTE — Progress Notes (Signed)
[facility], [place] 01/10/21  Subjective:   LOS: 1 01/14 0701 - 01/15 0700 In: 7.2 [I.V.:7.2] Out: 250 [Urine:250] Patient resting in bed, denies chest pain, reports improvement in shortness of breath.  No nausea or vomiting.  She is on 5 L supplemental O2 via nasal cannula, on nitroglycerin and heparin infusions.  Objective:  Vital signs in last 24 hours:  Temp:  [97.9 F (36.6 C)] 97.9 F (36.6 C) (01/15 1328) Pulse Rate:  [75-155] 84 (01/15 1328) Resp:  [16-43] 23 (01/15 1328) BP: (90-192)/(52-102) 125/57 (01/15 1328) SpO2:  [66 %-100 %] 100 % (01/15 1328) FiO2 (%):  [100 %] 100 % (01/15 0955)  Weight change:  Filed Weights   01/09/21 0807 01/09/21 1001  Weight: 79.4 kg 85 kg    Intake/Output:    Intake/Output Summary (Last 24 hours) at 01/10/2021 1544 Last data filed at 01/10/2021 0603 Gross per 24 hour  Intake 7.16 ml  Output 250 ml  Net -242.84 ml    Physical Exam: General:  Resting in bed, in no acute distress  HEENT  normocephalic, atraumatic, oral mucous membranes moist  Pulm/lungs  respirations even, unlabored, lungs clear  CVS/Heart  S1-S2, no rubs or gallops  Abdomen:   Soft, nontender, nondistended  Extremities:  No peripheral edema  Neurologic:  Awake, alert, oriented  Skin:  No acute rashes or lesions      Basic Metabolic Panel:  Recent Labs  Lab 01/09/21 0813 01/10/21 0556  NA 136 139  K 4.7 4.4  CL 100 104  CO2 21* 24  GLUCOSE 297* 219*  BUN 50* 49*  CREATININE 4.33* 4.06*  CALCIUM 8.9 8.8*     CBC: Recent Labs  Lab 01/09/21 0813 01/10/21 1111  WBC 9.5 9.0  HGB 10.1* 8.4*  HCT 32.2* 27.1*  MCV 80.5 79.7*  PLT 571* 517*      Lab Results  Component Value Date   HEPBSAG NON REACTIVE 02/02/2020   HEPBSAB NON REACTIVE 02/02/2020   HEPBIGM NON REACTIVE 02/02/2020      Microbiology:  Recent Results (from the past 240 hour(s))  Resp Panel by RT-PCR (Flu A&B, Covid) Nasopharyngeal Swab      Status: None   Collection Time: 01/09/21  9:55 AM   Specimen: Nasopharyngeal Swab; Nasopharyngeal(NP) swabs in vial transport medium  Result Value Ref Range Status   SARS Coronavirus 2 by RT PCR NEGATIVE NEGATIVE Final    Comment: (NOTE) SARS-CoV-2 target nucleic acids are NOT DETECTED.  The SARS-CoV-2 RNA is generally detectable in upper respiratory specimens during the acute phase of infection. The lowest concentration of SARS-CoV-2 viral copies this assay can detect is 138 copies/mL. A negative result does not preclude SARS-Cov-2 infection and should not be used as the sole basis for treatment or other patient management decisions. A negative result may occur with  improper specimen collection/handling, submission of specimen other than nasopharyngeal swab, presence of viral mutation(s) within the areas targeted by this assay, and inadequate number of viral copies(<138 copies/mL). A negative result must be combined with clinical observations, patient history, and epidemiological information. The expected result is Negative.  Fact Sheet for Patients:  EntrepreneurPulse.com.au  Fact Sheet for Healthcare Providers:  IncredibleEmployment.be  This test is no t yet approved or cleared by the [place] FDA and  has been authorized for detection and/or diagnosis of SARS-CoV-2 by FDA under an Emergency Use Authorization (EUA). This EUA will remain  in effect (meaning this test can be used) for the  duration of the COVID-19 declaration under Section 564(b)(1) of the Act, 21 U.S.C.section 360bbb-3(b)(1), unless the authorization is terminated  or revoked sooner.       Influenza A by PCR NEGATIVE NEGATIVE Final   Influenza B by PCR NEGATIVE NEGATIVE Final    Comment: (NOTE) The Xpert Xpress SARS-CoV-2/FLU/RSV plus assay is intended as an aid in the diagnosis of influenza from Nasopharyngeal swab specimens and should not be used as a sole basis  for treatment. Nasal washings and aspirates are unacceptable for Xpert Xpress SARS-CoV-2/FLU/RSV testing.  Fact Sheet for Patients: EntrepreneurPulse.com.au  Fact Sheet for Healthcare Providers: IncredibleEmployment.be  This test is not yet approved or cleared by the [place] FDA and has been authorized for detection and/or diagnosis of SARS-CoV-2 by FDA under an Emergency Use Authorization (EUA). This EUA will remain in effect (meaning this test can be used) for the duration of the COVID-19 declaration under Section 564(b)(1) of the Act, 21 U.S.C. section 360bbb-3(b)(1), unless the authorization is terminated or revoked.  Performed at [facility], [address] [place], [place] [address]     Coagulation Studies: Recent Labs    01/09/21 1113  LABPROT 12.5  INR 1.0    Urinalysis: No results for input(s): COLORURINE, LABSPEC, PHURINE, GLUCOSEU, HGBUR, BILIRUBINUR, KETONESUR, PROTEINUR, UROBILINOGEN, NITRITE, LEUKOCYTESUR in the last 72 hours.  Invalid input(s): APPERANCEUR    Imaging: DG Chest 2 View  Result Date: 01/09/2021 CLINICAL DATA:  80 year old female with shortness of breath. EXAM: CHEST - 2 VIEW COMPARISON:  Radiographs 09/20/2020 and earlier. FINDINGS: Calcified aortic atherosclerosis. Cardiac size is at the upper limits of normal. Other mediastinal contours are within normal limits. Visualized tracheal air column is within normal limits. Chronic but diffusely increased pulmonary interstitium when compared to 09/20/2020 PA and lateral study. No pneumothorax, layering pleural effusion or consolidation. There does appear to be trace pleural fluid in the fissures now. Osteopenia. No acute osseous abnormality identified. Negative visible bowel gas pattern. IMPRESSION: 1. Acute on chronic interstitial opacity with suspected trace fluid in the fissures. Favor acute interstitial edema over viral/atypical respiratory  infection. 2.  Aortic Atherosclerosis (ICD10-I70.0). Electronically Signed   By: Genevie Ann M.D.   On: 01/09/2021 08:39   NM Pulmonary Perfusion  Result Date: 01/09/2021 CLINICAL DATA:  Chest pressure and shortness of breath today. EXAM: NUCLEAR MEDICINE PERFUSION LUNG SCAN TECHNIQUE: Perfusion images were obtained in multiple projections after intravenous injection of radiopharmaceutical. Ventilation scans intentionally deferred if perfusion scan and chest x-ray adequate for interpretation during COVID 19 epidemic. RADIOPHARMACEUTICALS:  4.13 mCi Tc-6m MAA IV COMPARISON:  PA and lateral chest today. FINDINGS: Perfusion appears normal without segmental or subsegmental defect. IMPRESSION: Negative for PE. Electronically Signed   By: Inge Rise M.D.   On: 01/09/2021 13:32   DG Chest Portable 1 View  Result Date: 01/09/2021 CLINICAL DATA:  Hypoxemia EXAM: PORTABLE CHEST 1 VIEW COMPARISON:  Portable exam 1645 hours compared to 0816 hours FINDINGS: Minimal enlargement of cardiac silhouette. Atherosclerotic calcification aorta. Diffuse interstitial infiltrates throughout both lungs increased from earlier study of same date, favor pulmonary edema due to rapidity of change and distribution. Infection is not completely excluded but is felt to be less likely. No pleural effusion or pneumothorax. IMPRESSION: Significant increase in diffuse interstitial infiltrates bilaterally since earlier exam of 01/09/2021 favoring pulmonary edema. Electronically Signed   By: Lavonia Dana M.D.   On: 01/09/2021 16:57   ECHOCARDIOGRAM COMPLETE  Result Date: 01/09/2021    ECHOCARDIOGRAM REPORT  Patient Name:   JAMILLAH CAMILO Date of Exam: 01/09/2021 Medical Rec #:  [id number]        Height:       66.0 in Accession #:    [id number]       Weight:       187.3 lb Date of Birth:  October 06, 1941       BSA:          1.945 m Patient Age:    65 years         BP:           131/98 mmHg Patient Gender: F                HR:           92 bpm.  Exam Location:  [facility] Procedure: 2D Echo, Color Doppler, Cardiac Doppler and Intracardiac            Opacification Agent Indications:     I21.4 NSTEMI  History:         Patient has prior history of Echocardiogram examinations, most                  recent 03/19/2020. CKD; Risk Factors:Hypertension, Diabetes and                  Dyslipidemia.  Sonographer:     Charmayne Sheer RDCS (AE) Referring Phys:  [id number] Soledad Gerlach NIU Diagnosing Phys: Bartholome Bill MD IMPRESSIONS  1. Left ventricular ejection fraction, by estimation, is 60 to 65%. The left ventricle has normal function. The left ventricle has no regional wall motion abnormalities. There is mild left ventricular hypertrophy. Left ventricular diastolic parameters are consistent with Grade I diastolic dysfunction (impaired relaxation).  2. Right ventricular systolic function is normal. The right ventricular size is normal.  3. The mitral valve is grossly normal. Mild mitral valve regurgitation.  4. The aortic valve is grossly normal. Aortic valve regurgitation is trivial. FINDINGS  Left Ventricle: Left ventricular ejection fraction, by estimation, is 60 to 65%. The left ventricle has normal function. The left ventricle has no regional wall motion abnormalities. Definity contrast agent was given IV to delineate the left ventricular  endocardial borders. The left ventricular internal cavity size was normal in size. There is mild left ventricular hypertrophy. Left ventricular diastolic parameters are consistent with Grade I diastolic dysfunction (impaired relaxation). Right Ventricle: The right ventricular size is normal. No increase in right ventricular wall thickness. Right ventricular systolic function is normal. Left Atrium: Left atrial size was normal in size. Right Atrium: Right atrial size was normal in size. Pericardium: There is no evidence of pericardial effusion. Mitral Valve: The mitral valve is grossly normal. Mild mitral valve regurgitation. MV peak gradient, 10.5  mmHg. The mean mitral valve gradient is 4.0 mmHg. Tricuspid Valve: The tricuspid valve is grossly normal. Tricuspid valve regurgitation is trivial. Aortic Valve: The aortic valve is grossly normal. Aortic valve regurgitation is trivial. Aortic valve mean gradient measures 4.0 mmHg. Aortic valve peak gradient measures 6.9 mmHg. Aortic valve area, by VTI measures 2.57 cm. Pulmonic Valve: The pulmonic valve was not well visualized. Pulmonic valve regurgitation is trivial. Aorta: The aortic root is normal in size and structure. IAS/Shunts: The interatrial septum was not well visualized.  LEFT VENTRICLE PLAX 2D LVIDd:         4.93 cm     Diastology LVIDs:         3.69 cm     LV e' medial:  4.68 cm/s LV PW:         1.00 cm     LV E/e' medial:  25.6 LV IVS:        0.78 cm     LV e' lateral:   9.14 cm/s LVOT diam:     2.10 cm     LV E/e' lateral: 13.1 LV SV:         63 LV SV Index:   33 LVOT Area:     3.46 cm  LV Volumes (MOD) LV vol d, MOD A2C: 94.0 ml LV vol d, MOD A4C: 71.6 ml LV vol s, MOD A2C: 41.5 ml LV vol s, MOD A4C: 17.2 ml LV SV MOD A2C:     52.5 ml LV SV MOD A4C:     71.6 ml LV SV MOD BP:      58.0 ml RIGHT VENTRICLE RV Basal diam:  2.23 cm LEFT ATRIUM             Index       RIGHT ATRIUM          Index LA diam:        3.50 cm 1.80 cm/m  RA Area:     5.88 cm LA Vol (A2C):   43.8 ml 22.52 ml/m RA Volume:   7.50 ml  3.86 ml/m LA Vol (A4C):   47.7 ml 24.53 ml/m LA Biplane Vol: 46.2 ml 23.76 ml/m  AORTIC VALVE                   PULMONIC VALVE AV Area (Vmax):    2.42 cm    PV Vmax:       1.09 m/s AV Area (Vmean):   2.56 cm    PV Vmean:      69.600 cm/s AV Area (VTI):     2.57 cm    PV VTI:        0.175 m AV Vmax:           131.00 cm/s PV Peak grad:  4.8 mmHg AV Vmean:          87.800 cm/s PV Mean grad:  2.0 mmHg AV VTI:            0.247 m AV Peak Grad:      6.9 mmHg AV Mean Grad:      4.0 mmHg LVOT Vmax:         91.70 cm/s LVOT Vmean:        64.800 cm/s LVOT VTI:          0.183 m LVOT/AV VTI ratio: 0.74   AORTA Ao Root diam: 3.00 cm MITRAL VALVE MV Area (PHT): 5.27 cm     SHUNTS MV Area VTI:   2.28 cm     Systemic VTI:  0.18 m MV Peak grad:  10.5 mmHg    Systemic Diam: 2.10 cm MV Mean grad:  4.0 mmHg MV Vmax:       1.62 m/s MV Vmean:      97.9 cm/s MV Decel Time: 144 msec MV E velocity: 120.00 cm/s MV A velocity: 154.00 cm/s MV E/A ratio:  0.78 Bartholome Bill MD Electronically signed by Bartholome Bill MD Signature Date/Time: 01/09/2021/3:36:24 PM    Final      Medications:   . heparin 1,250 Units/hr (01/10/21 1306)  . nitroGLYCERIN 25 mcg/min (01/09/21 1740)   . amLODipine  10 mg Oral Daily  . aspirin EC  81 mg Oral Daily  . atorvastatin  40 mg Oral Daily  . calcitRIOL  0.25 mcg Oral Daily  . hydrALAZINE  25 mg Oral BID  . insulin aspart  0-5 Units Subcutaneous QHS  . insulin aspart  0-9 Units Subcutaneous TID WC  . insulin glargine  12 Units Subcutaneous QHS  . [START ON 01/11/2021] isosorbide mononitrate  120 mg Oral Daily  . isosorbide mononitrate  120 mg Oral Once  . loratadine  10 mg Oral Daily  . metoprolol succinate  100 mg Oral Daily  . multivitamin with minerals  1 tablet Oral Daily  . pantoprazole  40 mg Oral Daily  . ranolazine  1,000 mg Oral BID   acetaminophen, albuterol, hydrALAZINE, morphine injection, nitroGLYCERIN, ondansetron (ZOFRAN) IV  Assessment/ Plan:  80 y.o. female with hypertension, systolic congestive heart failure, hyperlipidemia, diabetes mellitus type II insulin dependent, aortic valve stenosis  Admitted on 01/09/2021 for   Principal Problem:   NSTEMI (non-ST elevated myocardial infarction) [facility]) Active Problems:   Benign essential HTN   Type II diabetes mellitus with renal manifestations (HCC)   Hyperlipidemia   CAD (coronary artery disease)   Anemia in chronic kidney disease   Acute renal failure superimposed on stage 4 chronic kidney disease (HCC)   Chronic diastolic CHF (congestive heart failure) ([place])   #.  Kidney disease stage IV/V Recent  Labs    01/09/21 0813 01/10/21 0556  CREATININE 4.33* 4.06*  Baseline Creatinine 3.10 / GFR 14-16 on 11/25/2020 Presenting Creatinine 4.33/GFR 10 AKI versus progression of underlying chronic kidney disease  Patient denies uremic symptoms Renal function improving slightly We will continue monitoring closely No acute indication for dialysis  #. Anemia of CKD Lab Results  Component Value Date   HGB 8.4 (L) 01/10/2021   We will continue monitoring CBCs closely  #.  Secondary hyperparathyroidism     Component Value Date/Time   PTH 35 02/02/2020 0553   Lab Results  Component Value Date   PHOS 4.2 02/02/2020  Patient is on calcitriol  #. HTN with CKD Blood pressure readings within low normal range Current antihypertensive regimen includes amlodipine ,hydralazine, isosorbide mononitrate and metoprolol  #. Diabetes type 2 with CKD Hgb A1c MFr Bld (%)  Date Value  01/10/2021 8.3 (H)  Continue insulin aspart and insulin glargine   LOS: [place] 1/15/20223:44 PM  [facility] [place], [place]

## 2021-01-10 NOTE — ED Notes (Signed)
Lab called to collect Heparin level

## 2021-01-10 NOTE — Progress Notes (Signed)
Patient Name: Brittney [name] Date of Encounter: 01/10/2021  Hospital Problem List     Principal Problem:   NSTEMI (non-ST elevated myocardial infarction) [facility]) Active Problems:   Benign essential HTN   Type II diabetes mellitus with renal manifestations ([place])   Hyperlipidemia   CAD (coronary artery disease)   Anemia in chronic kidney disease   Acute renal failure superimposed on stage 4 chronic kidney disease (HCC)   Chronic diastolic CHF (congestive heart failure) [facility])    Patient Profile     80 year old female with a past medical history significant for coronary artery disease, HFpEF, aortic valve stenosis, type 2 diabetes, chronic kidney disease, hyperlipidemia, and hypertension who presented to the ED on 01/09/21 for a one day history of chest pain with associated shortness of breath.  Workup in the ED has been significant for ECG revealing sinus tachycardia, rate of 111bpm, with nonspecific ST abnormalities, chest xray revealing suspected acute interstitial edema, high sensitivity troponin 186 and 1020 respectively, BNP of 176, COVID-19 negative, creatinine of 4.33, and a GFR of 10.   She is followed in outpatient cardiology by Dr. Clayborn Bigness. Most recent echocardiogram on 11/12/20 revealed normal RV and LV systolic function with an EF estimated greater than 55% with moderate MR, mild TR, mild AR, and mild LVH.  Stress test on 06/25/20 revealed no evidence of ischemia.  Left heart catheterization on 01/23/19 revealed moderate to severe disease in the proximal LAD, 70% stenosed, as well as 70% stenosis in the distal LAD.  RCA had a 99% mid to distal lesion with calcification.     Subjective   No chest pain.  Does have some shortness of breath.  On BiPAP and facemask oxygen.  Inpatient Medications    . amLODipine  10 mg Oral Daily  . aspirin EC  81 mg Oral Daily  . atorvastatin  40 mg Oral Daily  . calcitRIOL  0.25 mcg Oral Daily  . hydrALAZINE  25 mg Oral BID  . insulin  aspart  0-5 Units Subcutaneous QHS  . insulin aspart  0-9 Units Subcutaneous TID WC  . insulin glargine  12 Units Subcutaneous QHS  . isosorbide mononitrate  60 mg Oral Daily  . loratadine  10 mg Oral Daily  . metoprolol succinate  100 mg Oral Daily  . multivitamin with minerals  1 tablet Oral Daily  . pantoprazole  40 mg Oral Daily  . ranolazine  500 mg Oral BID    Vital Signs    Vitals:   01/10/21 0500 01/10/21 0530 01/10/21 0600 01/10/21 0630  BP: (!) 132/59 130/64 133/73 (!) 149/70  Pulse: 87 92 94 85  Resp: 20 18 (!) 22 18  Temp:      TempSrc:      SpO2: 100% 100% 100% 100%  Weight:      Height:        Intake/Output Summary (Last 24 hours) at 01/10/2021 0947 Last data filed at 01/10/2021 0603 Gross per 24 hour  Intake 7.16 ml  Output 250 ml  Net -242.84 ml   Filed Weights   01/09/21 0807 01/09/21 1001  Weight: 79.4 kg 85 kg    Physical Exam    GEN: Well nourished, well developed, breathing with BiPAP.Marland Kitchen  HEENT: normal.  Neck: Supple, no JVD, carotid bruits, or masses. Cardiac: RRR, no murmurs, rubs, or gallops. No clubbing, cyanosis, edema.  Radials/DP/PT 2+ and equal bilaterally.  Respiratory:  Respirations regular and unlabored, clear to auscultation bilaterally. GI: Soft,  nontender, nondistended, BS + x 4. MS: no deformity or atrophy. Skin: warm and dry, no rash. Neuro:  Strength and sensation are intact. Psych: Normal affect.  Labs    CBC Recent Labs    01/09/21 0813  WBC 9.5  HGB 10.1*  HCT 32.2*  MCV 80.5  PLT 542*   Basic Metabolic Panel Recent Labs    01/09/21 0813 01/10/21 0556  NA 136 139  K 4.7 4.4  CL 100 104  CO2 21* 24  GLUCOSE 297* 219*  BUN 50* 49*  CREATININE 4.33* 4.06*  CALCIUM 8.9 8.8*   Liver Function Tests Recent Labs    01/09/21 1715  AST 55*  ALT 20  ALKPHOS 31*  BILITOT 0.6  PROT 7.2  ALBUMIN 3.3*   No results for input(s): LIPASE, AMYLASE in the last 72 hours. Cardiac Enzymes No results for input(s):  CKTOTAL, CKMB, CKMBINDEX, TROPONINI in the last 72 hours. BNP Recent Labs    01/09/21 0813 01/09/21 1715  BNP 176.3* 410.8*   D-Dimer No results for input(s): DDIMER in the last 72 hours. Hemoglobin A1C No results for input(s): HGBA1C in the last 72 hours. Fasting Lipid Panel Recent Labs    01/10/21 0556  CHOL 145  HDL 31*  LDLCALC 69  TRIG 225*  CHOLHDL 4.7   Thyroid Function Tests No results for input(s): TSH, T4TOTAL, T3FREE, THYROIDAB in the last 72 hours.  Invalid input(s): FREET3  Telemetry    Normal sinus rhythm.  ECG    Sinus rhythm with no obvious ischemia.  Radiology    DG Chest 2 View  Result Date: 01/09/2021 CLINICAL DATA:  80 year old female with shortness of breath. EXAM: CHEST - 2 VIEW COMPARISON:  Radiographs 09/20/2020 and earlier. FINDINGS: Calcified aortic atherosclerosis. Cardiac size is at the upper limits of normal. Other mediastinal contours are within normal limits. Visualized tracheal air column is within normal limits. Chronic but diffusely increased pulmonary interstitium when compared to 09/20/2020 PA and lateral study. No pneumothorax, layering pleural effusion or consolidation. There does appear to be trace pleural fluid in the fissures now. Osteopenia. No acute osseous abnormality identified. Negative visible bowel gas pattern. IMPRESSION: 1. Acute on chronic interstitial opacity with suspected trace fluid in the fissures. Favor acute interstitial edema over viral/atypical respiratory infection. 2.  Aortic Atherosclerosis (ICD10-I70.0). Electronically Signed   By: Genevie Ann M.D.   On: 01/09/2021 08:39   NM Pulmonary Perfusion  Result Date: 01/09/2021 CLINICAL DATA:  Chest pressure and shortness of breath today. EXAM: NUCLEAR MEDICINE PERFUSION LUNG SCAN TECHNIQUE: Perfusion images were obtained in multiple projections after intravenous injection of radiopharmaceutical. Ventilation scans intentionally deferred if perfusion scan and chest x-ray  adequate for interpretation during COVID 19 epidemic. RADIOPHARMACEUTICALS:  4.13 mCi Tc-43m MAA IV COMPARISON:  PA and lateral chest today. FINDINGS: Perfusion appears normal without segmental or subsegmental defect. IMPRESSION: Negative for PE. Electronically Signed   By: Inge Rise M.D.   On: 01/09/2021 13:32   DG Chest Portable 1 View  Result Date: 01/09/2021 CLINICAL DATA:  Hypoxemia EXAM: PORTABLE CHEST 1 VIEW COMPARISON:  Portable exam 1645 hours compared to 0816 hours FINDINGS: Minimal enlargement of cardiac silhouette. Atherosclerotic calcification aorta. Diffuse interstitial infiltrates throughout both lungs increased from earlier study of same date, favor pulmonary edema due to rapidity of change and distribution. Infection is not completely excluded but is felt to be less likely. No pleural effusion or pneumothorax. IMPRESSION: Significant increase in diffuse interstitial infiltrates bilaterally since earlier exam of 01/09/2021  favoring pulmonary edema. Electronically Signed   By: Lavonia Dana M.D.   On: 01/09/2021 16:57   ECHOCARDIOGRAM COMPLETE  Result Date: 01/09/2021    ECHOCARDIOGRAM REPORT   Patient Name:   Brittney [name] Date of Exam: 01/09/2021 Medical Rec #:  [id number]        Height:       66.0 in Accession #:    [id number]       Weight:       187.3 lb Date of Birth:  [birth year]       BSA:          1.945 m Patient Age:    44 years         BP:           131/98 mmHg Patient Gender: F                HR:           92 bpm. Exam Location:  [facility] Procedure: 2D Echo, Color Doppler, Cardiac Doppler and Intracardiac            Opacification Agent Indications:     I21.4 NSTEMI  History:         Patient has prior history of Echocardiogram examinations, most                  recent 03/19/2020. CKD; Risk Factors:Hypertension, Diabetes and                  Dyslipidemia.  Sonographer:     Charmayne Sheer RDCS (AE) Referring Phys:  [id number] Soledad Gerlach NIU Diagnosing Phys: Bartholome Bill MD IMPRESSIONS  1. Left  ventricular ejection fraction, by estimation, is 60 to 65%. The left ventricle has normal function. The left ventricle has no regional wall motion abnormalities. There is mild left ventricular hypertrophy. Left ventricular diastolic parameters are consistent with Grade I diastolic dysfunction (impaired relaxation).  2. Right ventricular systolic function is normal. The right ventricular size is normal.  3. The mitral valve is grossly normal. Mild mitral valve regurgitation.  4. The aortic valve is grossly normal. Aortic valve regurgitation is trivial. FINDINGS  Left Ventricle: Left ventricular ejection fraction, by estimation, is 60 to 65%. The left ventricle has normal function. The left ventricle has no regional wall motion abnormalities. Definity contrast agent was given IV to delineate the left ventricular  endocardial borders. The left ventricular internal cavity size was normal in size. There is mild left ventricular hypertrophy. Left ventricular diastolic parameters are consistent with Grade I diastolic dysfunction (impaired relaxation). Right Ventricle: The right ventricular size is normal. No increase in right ventricular wall thickness. Right ventricular systolic function is normal. Left Atrium: Left atrial size was normal in size. Right Atrium: Right atrial size was normal in size. Pericardium: There is no evidence of pericardial effusion. Mitral Valve: The mitral valve is grossly normal. Mild mitral valve regurgitation. MV peak gradient, 10.5 mmHg. The mean mitral valve gradient is 4.0 mmHg. Tricuspid Valve: The tricuspid valve is grossly normal. Tricuspid valve regurgitation is trivial. Aortic Valve: The aortic valve is grossly normal. Aortic valve regurgitation is trivial. Aortic valve mean gradient measures 4.0 mmHg. Aortic valve peak gradient measures 6.9 mmHg. Aortic valve area, by VTI measures 2.57 cm. Pulmonic Valve: The pulmonic valve was not well visualized. Pulmonic valve regurgitation is  trivial. Aorta: The aortic root is normal in size and structure. IAS/Shunts: The interatrial septum was not well visualized.  LEFT VENTRICLE PLAX 2D LVIDd:  4.93 cm     Diastology LVIDs:         3.69 cm     LV e' medial:    4.68 cm/s LV PW:         1.00 cm     LV E/e' medial:  25.6 LV IVS:        0.78 cm     LV e' lateral:   9.14 cm/s LVOT diam:     2.10 cm     LV E/e' lateral: 13.1 LV SV:         63 LV SV Index:   33 LVOT Area:     3.46 cm  LV Volumes (MOD) LV vol d, MOD A2C: 94.0 ml LV vol d, MOD A4C: 71.6 ml LV vol s, MOD A2C: 41.5 ml LV vol s, MOD A4C: 17.2 ml LV SV MOD A2C:     52.5 ml LV SV MOD A4C:     71.6 ml LV SV MOD BP:      58.0 ml RIGHT VENTRICLE RV Basal diam:  2.23 cm LEFT ATRIUM             Index       RIGHT ATRIUM          Index LA diam:        3.50 cm 1.80 cm/m  RA Area:     5.88 cm LA Vol (A2C):   43.8 ml 22.52 ml/m RA Volume:   7.50 ml  3.86 ml/m LA Vol (A4C):   47.7 ml 24.53 ml/m LA Biplane Vol: 46.2 ml 23.76 ml/m  AORTIC VALVE                   PULMONIC VALVE AV Area (Vmax):    2.42 cm    PV Vmax:       1.09 m/s AV Area (Vmean):   2.56 cm    PV Vmean:      69.600 cm/s AV Area (VTI):     2.57 cm    PV VTI:        0.175 m AV Vmax:           131.00 cm/s PV Peak grad:  4.8 mmHg AV Vmean:          87.800 cm/s PV Mean grad:  2.0 mmHg AV VTI:            0.247 m AV Peak Grad:      6.9 mmHg AV Mean Grad:      4.0 mmHg LVOT Vmax:         91.70 cm/s LVOT Vmean:        64.800 cm/s LVOT VTI:          0.183 m LVOT/AV VTI ratio: 0.74  AORTA Ao Root diam: 3.00 cm MITRAL VALVE MV Area (PHT): 5.27 cm     SHUNTS MV Area VTI:   2.28 cm     Systemic VTI:  0.18 m MV Peak grad:  10.5 mmHg    Systemic Diam: 2.10 cm MV Mean grad:  4.0 mmHg MV Vmax:       1.62 m/s MV Vmean:      97.9 cm/s MV Decel Time: 144 msec MV E velocity: 120.00 cm/s MV A velocity: 154.00 cm/s MV E/A ratio:  0.78 Bartholome Bill MD Electronically signed by Bartholome Bill MD Signature Date/Time: 01/09/2021/3:36:24 PM    Final      Assessment & Plan    1.  Chest pain/elevated troponin              -  186, 1020 respectively; 13,582.             -Patient is currently chest pain free; due to AKI, will defer invasive workup with a LHC at this time; if renal function improves, may consider further workup early next week pending clinical status however currently, patient appears stable and would defer contrast study at present.             -Echocardiogram showed preserved LV function with no high-grade valvular abnormalities.             -Continue heparin, sublingual nitroglycerin we will attempt to optimize medical management by increasing isosorbide mononitrate to 120 mg daily as well as ranolazine to 1000 mg twice daily and follow-up pressure and symptoms.  2.  History of HFpEF              -Chest xray revealing possible edema, BNP slightly elevated but no clinical evidence of HF   3.  Acute on chronic renal insufficiency              -Creatinine on admission 4.33; appreciate nephrology input.  4.  Mitral insufficiency              -Mild moderate per recent echo;   Signed, Javier Docker. Keyoni Lapinski MD 01/10/2021, 9:47 AM  Pager: [phone]

## 2021-01-10 NOTE — Progress Notes (Signed)
PROGRESS NOTE    Brittney [name]  OIN:[id number] DOB: [birth year] DOA: 01/09/2021 PCP: Elisabeth Cara, NP   Brief Narrative:  This 80 years old female with PMH significant for hypertension, hyperlipidemia, DM, GERD, CKD stage IV, diastolic CHF, aortic valve stenosis, CAD presents with chest pain associated with shortness of breath.  Patient describes having chest pain since morning associated with shortness of breath,  pain radiating towards left side of chest.  She is found to have elevated troponins. VQ scan negative for PE.  She is admitted for non-STEMI, started on heparin.  Cardiology was consulted,  recommended there is no plan for any invasive intervention at this time since patient's chest pain has resolved, and worsening renal functions.  Nephrology was consulted there is no plan for urgent hemodialysis at this time,  continue to monitor serum creatinine.  Assessment & Plan:   Principal Problem:   NSTEMI (non-ST elevated myocardial infarction) ([place]) Active Problems:   Benign essential HTN   Type II diabetes mellitus with renal manifestations (HCC)   Hyperlipidemia   CAD (coronary artery disease)   Anemia in chronic kidney disease   Acute renal failure superimposed on stage 4 chronic kidney disease (HCC)   Chronic diastolic CHF (congestive heart failure) (HCC)   NSTEMI (non-ST elevated myocardial infarction) and hx of CAD: trop 186 -->1020.  Patient presented with chest pain associated with shortness of breath.  Found to have elevated troponins. Cardiology was consulted.  Patient was started on heparin GTT. Due to worsening renal functions, cannot do cardiac cath at this time. VQ scan negative for PE. Patient's chest pain has resolved. Cardiology recommended medical management at this time. No plan for invasive intervention at this time since renal functions improved. admitted to progressive unit as inpatient Continue IV heparin Continue prn Nitroglycerin, Morphine, and  aspirin, lipitor, Imdur, metoprolol, Ranexa Risk factor stratification: will check FLP and A1C  2d echo  Benign essential HTN Continue metoprolol, amlodipine, hydralazine Hold Cozaar due to worsening renal function Continue IV hydralazine as needed  Type II diabetes mellitus with renal manifestations [facility]):  Recent A1c 10.5, poorly controlled.  Patient is taking metformin, Jardiance, glipizide and Lantus.\ Hold p.o. home diabeticmedications. -Decrease Lantus dose from 16 to 12 units daily -Sliding scale insulin  Hyperlipidemia Continue Lipitor  Anemia in chronic kidney disease: Hgb stable, 10.1 -f/u by CBC  Acute renal failure superimposed on stage 4 chronic kidney disease ([place]):  Improving Baseline creatinine 2.83 on 10/14/2020.  His creatinine is a 4.33, BUN 50. Nephrology was consulted recommended there is no need for urgent hemodialysis at this time  Continue to monitor renal functions.  Serum creatinine improving. -Hold torsemide, Cozaar, Similac total  Chronic diastolic CHF (congestive heart failure) ([place]): 2D echo 03/19/2020 showed EF of 60 to 35% with grade 1 diastolic dysfunction.  Patient has trace leg edema, BNP 176, chest x-ray showed interstitial edema.  Initially patient was doing fine, but after giving IV fluid due to worsening renal function, patient developed respiratory distress.  BiPAP started.  The repeated chest that showed worsening interstitial edema. -will give 1 dose of Lasix 40 mg. -Continue BiPAP  DVT prophylaxis: Heparin gtt Code Status: Full code Family Communication: No family at bedside Disposition Plan:  Status is: Inpatient  Remains inpatient appropriate because:Inpatient level of care appropriate due to severity of illness   Dispo: The patient is from: Home              Anticipated d/c is to: Home  Anticipated d/c date is: > 3 days              Patient currently is not medically stable to d/c.  Consultants:    Cardiology  Nephrology  Procedures:  Antimicrobials :  Anti-infectives (From admission, onward)   None     Subjective: Patient was seen and examined at bedside.  Overnight events noted.  Patient denies any chest pain.  She appears comfortable.  Objective: Vitals:   01/10/21 1200 01/10/21 1230 01/10/21 1300 01/10/21 1328  BP: 117/61 (!) 121/52 (!) 115/52 (!) 125/57  Pulse: 93 79 77 84  Resp: (!) 23 (!) 28 (!) 25 (!) 23  Temp:    97.9 F (36.6 C)  TempSrc:    Oral  SpO2: 100% 100% 100% 100%  Weight:      Height:        Intake/Output Summary (Last 24 hours) at 01/10/2021 1510 Last data filed at 01/10/2021 0603 Gross per 24 hour  Intake 7.16 ml  Output 250 ml  Net -242.84 ml   Filed Weights   01/09/21 0807 01/09/21 1001  Weight: 79.4 kg 85 kg    Examination:  General exam: Appears calm and comfortable, not in any acute distress Respiratory system: Clear to auscultation. Respiratory effort normal. Cardiovascular system: S1 & S2 heard, RRR. No JVD, murmurs, rubs, gallops or clicks. No pedal edema. Gastrointestinal system: Abdomen is nondistended, soft and nontender. No organomegaly or masses felt. Normal bowel sounds heard. Central nervous system: Alert and oriented. No focal neurological deficits. Extremities: Symmetric 5 x 5 power. Skin: No rashes, lesions or ulcers Psychiatry: Judgement and insight appear normal. Mood & affect appropriate.     Data Reviewed: I have personally reviewed following labs and imaging studies  CBC: Recent Labs  Lab 01/09/21 0813 01/10/21 1111  WBC 9.5 9.0  HGB 10.1* 8.4*  HCT 32.2* 27.1*  MCV 80.5 79.7*  PLT 571* 010*   Basic Metabolic Panel: Recent Labs  Lab 01/09/21 0813 01/10/21 0556  NA 136 139  K 4.7 4.4  CL 100 104  CO2 21* 24  GLUCOSE 297* 219*  BUN 50* 49*  CREATININE 4.33* 4.06*  CALCIUM 8.9 8.8*   GFR: Estimated Creatinine Clearance: 12.1 mL/min (A) (by C-G formula based on SCr of 4.06 mg/dL  (H)). Liver Function Tests: Recent Labs  Lab 01/09/21 1715  AST 55*  ALT 20  ALKPHOS 31*  BILITOT 0.6  PROT 7.2  ALBUMIN 3.3*   No results for input(s): LIPASE, AMYLASE in the last 168 hours. No results for input(s): AMMONIA in the last 168 hours. Coagulation Profile: Recent Labs  Lab 01/09/21 1113  INR 1.0   Cardiac Enzymes: No results for input(s): CKTOTAL, CKMB, CKMBINDEX, TROPONINI in the last 168 hours. BNP (last 3 results) No results for input(s): PROBNP in the last 8760 hours. HbA1C: Recent Labs    01/10/21 0556  HGBA1C 8.3*   CBG: Recent Labs  Lab 01/09/21 1712 01/09/21 2206 01/10/21 1010 01/10/21 1154  GLUCAP 334* 265* 194* 285*   Lipid Profile: Recent Labs    01/10/21 0556  CHOL 145  HDL 31*  LDLCALC 69  TRIG 225*  CHOLHDL 4.7   Thyroid Function Tests: No results for input(s): TSH, T4TOTAL, FREET4, T3FREE, THYROIDAB in the last 72 hours. Anemia Panel: No results for input(s): VITAMINB12, FOLATE, FERRITIN, TIBC, IRON, RETICCTPCT in the last 72 hours. Sepsis Labs: No results for input(s): PROCALCITON, LATICACIDVEN in the last 168 hours.  Recent Results (from the past  240 hour(s))  Resp Panel by RT-PCR (Flu A&B, Covid) Nasopharyngeal Swab     Status: None   Collection Time: 01/09/21  9:55 AM   Specimen: Nasopharyngeal Swab; Nasopharyngeal(NP) swabs in vial transport medium  Result Value Ref Range Status   SARS Coronavirus 2 by RT PCR NEGATIVE NEGATIVE Final    Comment: (NOTE) SARS-CoV-2 target nucleic acids are NOT DETECTED.  The SARS-CoV-2 RNA is generally detectable in upper respiratory specimens during the acute phase of infection. The lowest concentration of SARS-CoV-2 viral copies this assay can detect is 138 copies/mL. A negative result does not preclude SARS-Cov-2 infection and should not be used as the sole basis for treatment or other patient management decisions. A negative result may occur with  improper specimen  collection/handling, submission of specimen other than nasopharyngeal swab, presence of viral mutation(s) within the areas targeted by this assay, and inadequate number of viral copies(<138 copies/mL). A negative result must be combined with clinical observations, patient history, and epidemiological information. The expected result is Negative.  Fact Sheet for Patients:  EntrepreneurPulse.com.au  Fact Sheet for Healthcare Providers:  IncredibleEmployment.be  This test is no t yet approved or cleared by the [place] FDA and  has been authorized for detection and/or diagnosis of SARS-CoV-2 by FDA under an Emergency Use Authorization (EUA). This EUA will remain  in effect (meaning this test can be used) for the duration of the COVID-19 declaration under Section 564(b)(1) of the Act, 21 U.S.C.section 360bbb-3(b)(1), unless the authorization is terminated  or revoked sooner.       Influenza A by PCR NEGATIVE NEGATIVE Final   Influenza B by PCR NEGATIVE NEGATIVE Final    Comment: (NOTE) The Xpert Xpress SARS-CoV-2/FLU/RSV plus assay is intended as an aid in the diagnosis of influenza from Nasopharyngeal swab specimens and should not be used as a sole basis for treatment. Nasal washings and aspirates are unacceptable for Xpert Xpress SARS-CoV-2/FLU/RSV testing.  Fact Sheet for Patients: EntrepreneurPulse.com.au  Fact Sheet for Healthcare Providers: IncredibleEmployment.be  This test is not yet approved or cleared by the [place] FDA and has been authorized for detection and/or diagnosis of SARS-CoV-2 by FDA under an Emergency Use Authorization (EUA). This EUA will remain in effect (meaning this test can be used) for the duration of the COVID-19 declaration under Section 564(b)(1) of the Act, 21 U.S.C. section 360bbb-3(b)(1), unless the authorization is terminated or revoked.  Performed at [facility], [address], [place], [place] [address]      Radiology Studies: DG Chest 2 View  Result Date: 01/09/2021 CLINICAL DATA:  80 year old female with shortness of breath. EXAM: CHEST - 2 VIEW COMPARISON:  Radiographs 09/20/2020 and earlier. FINDINGS: Calcified aortic atherosclerosis. Cardiac size is at the upper limits of normal. Other mediastinal contours are within normal limits. Visualized tracheal air column is within normal limits. Chronic but diffusely increased pulmonary interstitium when compared to 09/20/2020 PA and lateral study. No pneumothorax, layering pleural effusion or consolidation. There does appear to be trace pleural fluid in the fissures now. Osteopenia. No acute osseous abnormality identified. Negative visible bowel gas pattern. IMPRESSION: 1. Acute on chronic interstitial opacity with suspected trace fluid in the fissures. Favor acute interstitial edema over viral/atypical respiratory infection. 2.  Aortic Atherosclerosis (ICD10-I70.0). Electronically Signed   By: Genevie Ann M.D.   On: 01/09/2021 08:39   NM Pulmonary Perfusion  Result Date: 01/09/2021 CLINICAL DATA:  Chest pressure and shortness of breath today. EXAM: NUCLEAR MEDICINE PERFUSION LUNG SCAN  TECHNIQUE: Perfusion images were obtained in multiple projections after intravenous injection of radiopharmaceutical. Ventilation scans intentionally deferred if perfusion scan and chest x-ray adequate for interpretation during COVID 19 epidemic. RADIOPHARMACEUTICALS:  4.13 mCi Tc-35m MAA IV COMPARISON:  PA and lateral chest today. FINDINGS: Perfusion appears normal without segmental or subsegmental defect. IMPRESSION: Negative for PE. Electronically Signed   By: Inge Rise M.D.   On: 01/09/2021 13:32   DG Chest Portable 1 View  Result Date: 01/09/2021 CLINICAL DATA:  Hypoxemia EXAM: PORTABLE CHEST 1 VIEW COMPARISON:  Portable exam 1645 hours compared to 0816 hours FINDINGS: Minimal enlargement of cardiac  silhouette. Atherosclerotic calcification aorta. Diffuse interstitial infiltrates throughout both lungs increased from earlier study of same date, favor pulmonary edema due to rapidity of change and distribution. Infection is not completely excluded but is felt to be less likely. No pleural effusion or pneumothorax. IMPRESSION: Significant increase in diffuse interstitial infiltrates bilaterally since earlier exam of 01/09/2021 favoring pulmonary edema. Electronically Signed   By: Lavonia Dana M.D.   On: 01/09/2021 16:57   ECHOCARDIOGRAM COMPLETE  Result Date: 01/09/2021    ECHOCARDIOGRAM REPORT   Patient Name:   ITZAMARA CASAS Date of Exam: 01/09/2021 Medical Rec #:  [id number]        Height:       66.0 in Accession #:    [id number]       Weight:       187.3 lb Date of Birth:  1941/10/10       BSA:          1.945 m Patient Age:    58 years         BP:           131/98 mmHg Patient Gender: F                HR:           92 bpm. Exam Location:  [facility] Procedure: 2D Echo, Color Doppler, Cardiac Doppler and Intracardiac            Opacification Agent Indications:     I21.4 NSTEMI  History:         Patient has prior history of Echocardiogram examinations, most                  recent 03/19/2020. CKD; Risk Factors:Hypertension, Diabetes and                  Dyslipidemia.  Sonographer:     Charmayne Sheer RDCS (AE) Referring Phys:  [id number] Soledad Gerlach NIU Diagnosing Phys: Bartholome Bill MD IMPRESSIONS  1. Left ventricular ejection fraction, by estimation, is 60 to 65%. The left ventricle has normal function. The left ventricle has no regional wall motion abnormalities. There is mild left ventricular hypertrophy. Left ventricular diastolic parameters are consistent with Grade I diastolic dysfunction (impaired relaxation).  2. Right ventricular systolic function is normal. The right ventricular size is normal.  3. The mitral valve is grossly normal. Mild mitral valve regurgitation.  4. The aortic valve is grossly normal. Aortic valve  regurgitation is trivial. FINDINGS  Left Ventricle: Left ventricular ejection fraction, by estimation, is 60 to 65%. The left ventricle has normal function. The left ventricle has no regional wall motion abnormalities. Definity contrast agent was given IV to delineate the left ventricular  endocardial borders. The left ventricular internal cavity size was normal in size. There is mild left ventricular hypertrophy. Left ventricular diastolic parameters are consistent with Grade I  diastolic dysfunction (impaired relaxation). Right Ventricle: The right ventricular size is normal. No increase in right ventricular wall thickness. Right ventricular systolic function is normal. Left Atrium: Left atrial size was normal in size. Right Atrium: Right atrial size was normal in size. Pericardium: There is no evidence of pericardial effusion. Mitral Valve: The mitral valve is grossly normal. Mild mitral valve regurgitation. MV peak gradient, 10.5 mmHg. The mean mitral valve gradient is 4.0 mmHg. Tricuspid Valve: The tricuspid valve is grossly normal. Tricuspid valve regurgitation is trivial. Aortic Valve: The aortic valve is grossly normal. Aortic valve regurgitation is trivial. Aortic valve mean gradient measures 4.0 mmHg. Aortic valve peak gradient measures 6.9 mmHg. Aortic valve area, by VTI measures 2.57 cm. Pulmonic Valve: The pulmonic valve was not well visualized. Pulmonic valve regurgitation is trivial. Aorta: The aortic root is normal in size and structure. IAS/Shunts: The interatrial septum was not well visualized.  LEFT VENTRICLE PLAX 2D LVIDd:         4.93 cm     Diastology LVIDs:         3.69 cm     LV e' medial:    4.68 cm/s LV PW:         1.00 cm     LV E/e' medial:  25.6 LV IVS:        0.78 cm     LV e' lateral:   9.14 cm/s LVOT diam:     2.10 cm     LV E/e' lateral: 13.1 LV SV:         63 LV SV Index:   33 LVOT Area:     3.46 cm  LV Volumes (MOD) LV vol d, MOD A2C: 94.0 ml LV vol d, MOD A4C: 71.6 ml LV vol s,  MOD A2C: 41.5 ml LV vol s, MOD A4C: 17.2 ml LV SV MOD A2C:     52.5 ml LV SV MOD A4C:     71.6 ml LV SV MOD BP:      58.0 ml RIGHT VENTRICLE RV Basal diam:  2.23 cm LEFT ATRIUM             Index       RIGHT ATRIUM          Index LA diam:        3.50 cm 1.80 cm/m  RA Area:     5.88 cm LA Vol (A2C):   43.8 ml 22.52 ml/m RA Volume:   7.50 ml  3.86 ml/m LA Vol (A4C):   47.7 ml 24.53 ml/m LA Biplane Vol: 46.2 ml 23.76 ml/m  AORTIC VALVE                   PULMONIC VALVE AV Area (Vmax):    2.42 cm    PV Vmax:       1.09 m/s AV Area (Vmean):   2.56 cm    PV Vmean:      69.600 cm/s AV Area (VTI):     2.57 cm    PV VTI:        0.175 m AV Vmax:           131.00 cm/s PV Peak grad:  4.8 mmHg AV Vmean:          87.800 cm/s PV Mean grad:  2.0 mmHg AV VTI:            0.247 m AV Peak Grad:      6.9 mmHg AV Mean Grad:  4.0 mmHg LVOT Vmax:         91.70 cm/s LVOT Vmean:        64.800 cm/s LVOT VTI:          0.183 m LVOT/AV VTI ratio: 0.74  AORTA Ao Root diam: 3.00 cm MITRAL VALVE MV Area (PHT): 5.27 cm     SHUNTS MV Area VTI:   2.28 cm     Systemic VTI:  0.18 m MV Peak grad:  10.5 mmHg    Systemic Diam: 2.10 cm MV Mean grad:  4.0 mmHg MV Vmax:       1.62 m/s MV Vmean:      97.9 cm/s MV Decel Time: 144 msec MV E velocity: 120.00 cm/s MV A velocity: 154.00 cm/s MV E/A ratio:  0.78 Bartholome Bill MD Electronically signed by Bartholome Bill MD Signature Date/Time: 01/09/2021/3:36:24 PM    Final     Scheduled Meds: . amLODipine  10 mg Oral Daily  . aspirin EC  81 mg Oral Daily  . atorvastatin  40 mg Oral Daily  . calcitRIOL  0.25 mcg Oral Daily  . hydrALAZINE  25 mg Oral BID  . insulin aspart  0-5 Units Subcutaneous QHS  . insulin aspart  0-9 Units Subcutaneous TID WC  . insulin glargine  12 Units Subcutaneous QHS  . [START ON 01/11/2021] isosorbide mononitrate  120 mg Oral Daily  . isosorbide mononitrate  120 mg Oral Once  . loratadine  10 mg Oral Daily  . metoprolol succinate  100 mg Oral Daily  . multivitamin  with minerals  1 tablet Oral Daily  . pantoprazole  40 mg Oral Daily  . ranolazine  1,000 mg Oral BID   Continuous Infusions: . heparin 1,250 Units/hr (01/10/21 1306)  . nitroGLYCERIN 25 mcg/min (01/09/21 1740)     LOS: 1 day    Time spent: 35 mins    Nicko Daher, MD [facility]   If 7PM-7AM, please contact night-coverage

## 2021-01-10 NOTE — ED Notes (Signed)
   Patient had large urine output and pure wick malfunction.  Patient was saturated from shoulders to knees.  Patient was cleaned, complete linen change performed, and new pure wick applied.  Assisted by [place] RN.

## 2021-01-11 LAB — HEMOGLOBIN AND HEMATOCRIT, BLOOD
HCT: 26.6 % — ABNORMAL LOW (ref 36.0–46.0)
Hemoglobin: 8.6 g/dL — ABNORMAL LOW (ref 12.0–15.0)

## 2021-01-11 LAB — GLUCOSE, CAPILLARY
Glucose-Capillary: 216 mg/dL — ABNORMAL HIGH (ref 70–99)
Glucose-Capillary: 266 mg/dL — ABNORMAL HIGH (ref 70–99)
Glucose-Capillary: 169 mg/dL — ABNORMAL HIGH (ref 70–99)
Glucose-Capillary: 162 mg/dL — ABNORMAL HIGH (ref 70–99)

## 2021-01-11 LAB — CBC
HCT: 25 % — ABNORMAL LOW (ref 36.0–46.0)
Hemoglobin: 7.9 g/dL — ABNORMAL LOW (ref 12.0–15.0)
MCH: 25.3 pg — ABNORMAL LOW (ref 26.0–34.0)
MCHC: 31.6 g/dL (ref 30.0–36.0)
MCV: 80.1 fL (ref 80.0–100.0)
Platelets: 456 10*3/uL — ABNORMAL HIGH (ref 150–400)
RBC: 3.12 MIL/uL — ABNORMAL LOW (ref 3.87–5.11)
RDW: 16.2 % — ABNORMAL HIGH (ref 11.5–15.5)
WBC: 12 10*3/uL — ABNORMAL HIGH (ref 4.0–10.5)
nRBC: 0 % (ref 0.0–0.2)

## 2021-01-11 LAB — MAGNESIUM: Magnesium: 2.6 mg/dL — ABNORMAL HIGH (ref 1.7–2.4)

## 2021-01-11 LAB — PHOSPHORUS: Phosphorus: 3 mg/dL (ref 2.5–4.6)

## 2021-01-11 LAB — BASIC METABOLIC PANEL
Anion gap: 10 (ref 5–15)
BUN: 43 mg/dL — ABNORMAL HIGH (ref 8–23)
CO2: 23 mmol/L (ref 22–32)
Calcium: 8.7 mg/dL — ABNORMAL LOW (ref 8.9–10.3)
Chloride: 101 mmol/L (ref 98–111)
Creatinine, Ser: 3.85 mg/dL — ABNORMAL HIGH (ref 0.44–1.00)
GFR, Estimated: 11 mL/min — ABNORMAL LOW (ref >60)
Glucose, Bld: 250 mg/dL — ABNORMAL HIGH (ref 70–99)
Potassium: 4.5 mmol/L (ref 3.5–5.1)
Sodium: 134 mmol/L — ABNORMAL LOW (ref 135–145)

## 2021-01-11 LAB — HEPARIN LEVEL (UNFRACTIONATED)
Heparin Unfractionated: 1.1 IU/mL — ABNORMAL HIGH (ref 0.30–0.70)
Heparin Unfractionated: 0.7 IU/mL (ref 0.30–0.70)

## 2021-01-11 MED ORDER — HEPARIN (PORCINE) 25000 UT/250ML-% IV SOLN
1050.0000 [IU]/h | INTRAVENOUS | Status: DC
  Administered 2021-01-11: 1100 [IU]/h via INTRAVENOUS

## 2021-01-11 NOTE — Progress Notes (Signed)
[facility], [place] 01/11/21  Subjective:   LOS: 2 01/15 0701 - 01/16 0700 In: 986.7 [P.O.:200; I.V.:786.7] Out: 700 [Urine:700] Creatinine down a bit to 3.85. Still having some shortness of breath.  Objective:  Vital signs in last 24 hours:  Temp:  [98 F (36.7 C)-99.2 F (37.3 C)] 98.6 F (37 C) (01/16 1524) Pulse Rate:  [72-91] 85 (01/16 1524) Resp:  [18-23] 20 (01/16 1524) BP: (103-135)/(32-91) 130/71 (01/16 1524) SpO2:  [97 %-100 %] 100 % (01/16 1524) Weight:  [85.9 kg] 85.9 kg (01/15 1915)  Weight change: 6.52 kg Filed Weights   01/09/21 0807 01/09/21 1001 01/10/21 1915  Weight: 79.4 kg 85 kg 85.9 kg    Intake/Output:    Intake/Output Summary (Last 24 hours) at 01/11/2021 1549 Last data filed at 01/11/2021 1340 Gross per 24 hour  Intake 1106.73 ml  Output 1100 ml  Net 6.73 ml    Physical Exam: General:  Resting in bed, in no acute distress  HEENT  normocephalic, atraumatic, oral mucous membranes moist  Pulm/lungs  respirations even, unlabored, lungs clear  CVS/Heart  S1-S2, no rubs or gallops  Abdomen:   Soft, nontender, nondistended  Extremities:  No peripheral edema  Neurologic:  Awake, alert, oriented  Skin:  No acute rashes or lesions      Basic Metabolic Panel:  Recent Labs  Lab 01/09/21 0813 01/10/21 0556 01/11/21 0424  NA 136 139 134*  K 4.7 4.4 4.5  CL 100 104 101  CO2 21* 24 23  GLUCOSE 297* 219* 250*  BUN 50* 49* 43*  CREATININE 4.33* 4.06* 3.85*  CALCIUM 8.9 8.8* 8.7*  MG  --   --  2.6*  PHOS  --   --  3.0     CBC: Recent Labs  Lab 01/09/21 0813 01/10/21 1111 01/11/21 0424 01/11/21 1444  WBC 9.5 9.0 12.0*  --   HGB 10.1* 8.4* 7.9* 8.6*  HCT 32.2* 27.1* 25.0* 26.6*  MCV 80.5 79.7* 80.1  --   PLT 571* 517* 456*  --       Lab Results  Component Value Date   HEPBSAG NON REACTIVE 02/02/2020   HEPBSAB NON REACTIVE 02/02/2020   HEPBIGM NON REACTIVE 02/02/2020       Microbiology:  Recent Results (from the past 240 hour(s))  Resp Panel by RT-PCR (Flu A&B, Covid) Nasopharyngeal Swab     Status: None   Collection Time: 01/09/21  9:55 AM   Specimen: Nasopharyngeal Swab; Nasopharyngeal(NP) swabs in vial transport medium  Result Value Ref Range Status   SARS Coronavirus 2 by RT PCR NEGATIVE NEGATIVE Final    Comment: (NOTE) SARS-CoV-2 target nucleic acids are NOT DETECTED.  The SARS-CoV-2 RNA is generally detectable in upper respiratory specimens during the acute phase of infection. The lowest concentration of SARS-CoV-2 viral copies this assay can detect is 138 copies/mL. A negative result does not preclude SARS-Cov-2 infection and should not be used as the sole basis for treatment or other patient management decisions. A negative result may occur with  improper specimen collection/handling, submission of specimen other than nasopharyngeal swab, presence of viral mutation(s) within the areas targeted by this assay, and inadequate number of viral copies(<138 copies/mL). A negative result must be combined with clinical observations, patient history, and epidemiological information. The expected result is Negative.  Fact Sheet for Patients:  EntrepreneurPulse.com.au  Fact Sheet for Healthcare Providers:  IncredibleEmployment.be  This test is no t yet approved or cleared by the [place] FDA  and  has been authorized for detection and/or diagnosis of SARS-CoV-2 by FDA under an Emergency Use Authorization (EUA). This EUA will remain  in effect (meaning this test can be used) for the duration of the COVID-19 declaration under Section 564(b)(1) of the Act, 21 U.S.C.section 360bbb-3(b)(1), unless the authorization is terminated  or revoked sooner.       Influenza A by PCR NEGATIVE NEGATIVE Final   Influenza B by PCR NEGATIVE NEGATIVE Final    Comment: (NOTE) The Xpert Xpress SARS-CoV-2/FLU/RSV plus  assay is intended as an aid in the diagnosis of influenza from Nasopharyngeal swab specimens and should not be used as a sole basis for treatment. Nasal washings and aspirates are unacceptable for Xpert Xpress SARS-CoV-2/FLU/RSV testing.  Fact Sheet for Patients: EntrepreneurPulse.com.au  Fact Sheet for Healthcare Providers: IncredibleEmployment.be  This test is not yet approved or cleared by the [place] FDA and has been authorized for detection and/or diagnosis of SARS-CoV-2 by FDA under an Emergency Use Authorization (EUA). This EUA will remain in effect (meaning this test can be used) for the duration of the COVID-19 declaration under Section 564(b)(1) of the Act, 21 U.S.C. section 360bbb-3(b)(1), unless the authorization is terminated or revoked.  Performed at [facility], [address] [place], [place] [address]     Coagulation Studies: Recent Labs    01/09/21 1113  LABPROT 12.5  INR 1.0    Urinalysis: No results for input(s): COLORURINE, LABSPEC, PHURINE, GLUCOSEU, HGBUR, BILIRUBINUR, KETONESUR, PROTEINUR, UROBILINOGEN, NITRITE, LEUKOCYTESUR in the last 72 hours.  Invalid input(s): APPERANCEUR    Imaging: DG Chest Portable 1 View  Result Date: 01/09/2021 CLINICAL DATA:  Hypoxemia EXAM: PORTABLE CHEST 1 VIEW COMPARISON:  Portable exam 1645 hours compared to 0816 hours FINDINGS: Minimal enlargement of cardiac silhouette. Atherosclerotic calcification aorta. Diffuse interstitial infiltrates throughout both lungs increased from earlier study of same date, favor pulmonary edema due to rapidity of change and distribution. Infection is not completely excluded but is felt to be less likely. No pleural effusion or pneumothorax. IMPRESSION: Significant increase in diffuse interstitial infiltrates bilaterally since earlier exam of 01/09/2021 favoring pulmonary edema. Electronically Signed   By: Lavonia Dana M.D.   On:  01/09/2021 16:57     Medications:   . heparin 1,050 Units/hr (01/11/21 1546)  . nitroGLYCERIN Stopped (01/11/21 0845)   . amLODipine  10 mg Oral Daily  . aspirin EC  81 mg Oral Daily  . atorvastatin  40 mg Oral Daily  . calcitRIOL  0.25 mcg Oral Daily  . hydrALAZINE  25 mg Oral BID  . insulin aspart  0-5 Units Subcutaneous QHS  . insulin aspart  0-9 Units Subcutaneous TID WC  . insulin glargine  12 Units Subcutaneous QHS  . isosorbide mononitrate  120 mg Oral Daily  . isosorbide mononitrate  120 mg Oral Once  . loratadine  10 mg Oral Daily  . metoprolol succinate  100 mg Oral Daily  . multivitamin with minerals  1 tablet Oral Daily  . pantoprazole  40 mg Oral Daily  . ranolazine  1,000 mg Oral BID   acetaminophen, albuterol, hydrALAZINE, morphine injection, nitroGLYCERIN, ondansetron (ZOFRAN) IV  Assessment/ Plan:  80 y.o. female with hypertension, systolic congestive heart failure, hyperlipidemia, diabetes mellitus type II insulin dependent, aortic valve stenosis  Admitted on 01/09/2021 for   Principal Problem:   NSTEMI (non-ST elevated myocardial infarction) [facility]) Active Problems:   Benign essential HTN   Type II diabetes mellitus with renal manifestations ([place])  Hyperlipidemia   CAD (coronary artery disease)   Anemia in chronic kidney disease   Acute renal failure superimposed on stage 4 chronic kidney disease (HCC)   Chronic diastolic CHF (congestive heart failure) ([place])   #.  Kidney disease stage V Recent Labs    01/09/21 0813 01/10/21 0556 01/11/21 0424  CREATININE 4.33* 4.06* 3.85*  Baseline Creatinine 3.10 / GFR 14-16 on 11/25/2020 Presenting Creatinine 4.33/GFR 10 AKI versus progression of underlying chronic kidney disease  Overall renal function remains low.  eGFR currently 11.  Suspect that she will need dialysis in the relative near future.  May end up requiring at this admission.  No immediate need however.  #. Anemia of CKD Lab Results   Component Value Date   HGB 8.6 (L) 01/11/2021   Continue to monitor CBC closely.  Consider adding Epogen.  #.  Secondary hyperparathyroidism     Component Value Date/Time   PTH 35 02/02/2020 0553   Lab Results  Component Value Date   PHOS 3.0 01/11/2021  Continue calcitriol 0.25 mcg daily.  Serum phosphorus acceptable.  #. HTN with CKD Blood pressure readings within low normal range Current antihypertensive regimen includes amlodipine ,hydralazine, isosorbide mononitrate and metoprolol  #. Diabetes type 2 with CKD Hgb A1c MFr Bld (%)  Date Value  01/10/2021 8.3 (H)  Continue insulin aspart and insulin glargine   LOS: 2 Jimesha Rising 1/16/20223:49 PM  [place], [place]

## 2021-01-11 NOTE — Progress Notes (Signed)
Patient Name: Brittney [name] Date of Encounter: 01/11/2021  Hospital Problem List     Principal Problem:   NSTEMI (non-ST elevated myocardial infarction) [facility]) Active Problems:   Benign essential HTN   Type II diabetes mellitus with renal manifestations (HCC)   Hyperlipidemia   CAD (coronary artery disease)   Anemia in chronic kidney disease   Acute renal failure superimposed on stage 4 chronic kidney disease (HCC)   Chronic diastolic CHF (congestive heart failure) [facility])      Patient Profile      80 year old female with a past medical history significant for coronary artery disease, HFpEF, aortic valve stenosis, type 2 diabetes, chronic kidney disease, hyperlipidemia, and hypertension who presented to the ED on 01/09/21 for a one day history of chest pain with associated shortness of breath. Workup in the ED has been significant for ECG revealing sinus tachycardia, rate of 111bpm, with nonspecific ST abnormalities, chest xray revealing suspected acute interstitial edema, high sensitivity troponin 186 and 1020 respectively, BNP of 176, COVID-19 negative, creatinine of 4.33, and a GFR of 10.   She is followed in outpatient cardiology by Dr. Clayborn Bigness. Most recent echocardiogram on 11/12/20 revealed normal RV and LV systolic function with an EF estimated greater than 55% with moderate MR, mild TR, mild AR, and mild LVH. Stress test on 06/25/20 revealed no evidence of ischemia. Left heart catheterization on 01/23/19 revealed moderate to severe disease in the proximal LAD, 70% stenosed, as well as 70% stenosis in the distal LAD. RCA had a 99% mid to distal lesion with calcification.    Subjective   Denies chest pain.  Has some dyspnea on exertion.  Inpatient Medications    . amLODipine  10 mg Oral Daily  . aspirin EC  81 mg Oral Daily  . atorvastatin  40 mg Oral Daily  . calcitRIOL  0.25 mcg Oral Daily  . hydrALAZINE  25 mg Oral BID  . insulin aspart  0-5 Units Subcutaneous  QHS  . insulin aspart  0-9 Units Subcutaneous TID WC  . insulin glargine  12 Units Subcutaneous QHS  . isosorbide mononitrate  120 mg Oral Daily  . isosorbide mononitrate  120 mg Oral Once  . loratadine  10 mg Oral Daily  . metoprolol succinate  100 mg Oral Daily  . multivitamin with minerals  1 tablet Oral Daily  . pantoprazole  40 mg Oral Daily  . ranolazine  1,000 mg Oral BID    Vital Signs    Vitals:   01/11/21 0500 01/11/21 0729 01/11/21 1100 01/11/21 1524  BP: 119/60 122/66 130/61 130/71  Pulse: 87 86 80 85  Resp: 18 (!) 21 20 20   Temp: 99.2 F (37.3 C) 98.9 F (37.2 C) 98.7 F (37.1 C) 98.6 F (37 C)  TempSrc: Oral Oral Oral Oral  SpO2: 100% 100%  100%  Weight:      Height:        Intake/Output Summary (Last 24 hours) at 01/11/2021 1811 Last data filed at 01/11/2021 1340 Gross per 24 hour  Intake 1106.73 ml  Output 1100 ml  Net 6.73 ml   Filed Weights   01/09/21 0807 01/09/21 1001 01/10/21 1915  Weight: 79.4 kg 85 kg 85.9 kg    Physical Exam    GEN: Well nourished, well developed, in no acute distress.  HEENT: normal.  Neck: Supple, no JVD, carotid bruits, or masses. Cardiac: RRR, 2/6 systolic murmur Respiratory:  Respirations regular and unlabored, clear to auscultation bilaterally.  GI: Soft, nontender, nondistended, BS + x 4. MS: no deformity or atrophy. Skin: warm and dry, no rash. Neuro:  Strength and sensation are intact. Psych: Normal affect.  Labs    CBC Recent Labs    01/10/21 1111 01/11/21 0424 01/11/21 1444  WBC 9.0 12.0*  --   HGB 8.4* 7.9* 8.6*  HCT 27.1* 25.0* 26.6*  MCV 79.7* 80.1  --   PLT 517* 456*  --    Basic Metabolic Panel Recent Labs    01/10/21 0556 01/11/21 0424  NA 139 134*  K 4.4 4.5  CL 104 101  CO2 24 23  GLUCOSE 219* 250*  BUN 49* 43*  CREATININE 4.06* 3.85*  CALCIUM 8.8* 8.7*  MG  --  2.6*  PHOS  --  3.0   Liver Function Tests Recent Labs    01/09/21 1715  AST 55*  ALT 20  ALKPHOS 31*   BILITOT 0.6  PROT 7.2  ALBUMIN 3.3*   No results for input(s): LIPASE, AMYLASE in the last 72 hours. Cardiac Enzymes No results for input(s): CKTOTAL, CKMB, CKMBINDEX, TROPONINI in the last 72 hours. BNP Recent Labs    01/09/21 0813 01/09/21 1715  BNP 176.3* 410.8*   D-Dimer No results for input(s): DDIMER in the last 72 hours. Hemoglobin A1C Recent Labs    01/10/21 0556  HGBA1C 8.3*   Fasting Lipid Panel Recent Labs    01/10/21 0556  CHOL 145  HDL 31*  LDLCALC 69  TRIG 225*  CHOLHDL 4.7   Thyroid Function Tests No results for input(s): TSH, T4TOTAL, T3FREE, THYROIDAB in the last 72 hours.  Invalid input(s): FREET3  Telemetry    Sinus rhythm  ECG    Sinus rhythm  Radiology    DG Chest 2 View  Result Date: 01/09/2021 CLINICAL DATA:  80 year old female with shortness of breath. EXAM: CHEST - 2 VIEW COMPARISON:  Radiographs 09/20/2020 and earlier. FINDINGS: Calcified aortic atherosclerosis. Cardiac size is at the upper limits of normal. Other mediastinal contours are within normal limits. Visualized tracheal air column is within normal limits. Chronic but diffusely increased pulmonary interstitium when compared to 09/20/2020 PA and lateral study. No pneumothorax, layering pleural effusion or consolidation. There does appear to be trace pleural fluid in the fissures now. Osteopenia. No acute osseous abnormality identified. Negative visible bowel gas pattern. IMPRESSION: 1. Acute on chronic interstitial opacity with suspected trace fluid in the fissures. Favor acute interstitial edema over viral/atypical respiratory infection. 2.  Aortic Atherosclerosis (ICD10-I70.0). Electronically Signed   By: Genevie Ann M.D.   On: 01/09/2021 08:39   NM Pulmonary Perfusion  Result Date: 01/09/2021 CLINICAL DATA:  Chest pressure and shortness of breath today. EXAM: NUCLEAR MEDICINE PERFUSION LUNG SCAN TECHNIQUE: Perfusion images were obtained in multiple projections after intravenous  injection of radiopharmaceutical. Ventilation scans intentionally deferred if perfusion scan and chest x-ray adequate for interpretation during COVID 19 epidemic. RADIOPHARMACEUTICALS:  4.13 mCi Tc-77m MAA IV COMPARISON:  PA and lateral chest today. FINDINGS: Perfusion appears normal without segmental or subsegmental defect. IMPRESSION: Negative for PE. Electronically Signed   By: Inge Rise M.D.   On: 01/09/2021 13:32   DG Chest Portable 1 View  Result Date: 01/09/2021 CLINICAL DATA:  Hypoxemia EXAM: PORTABLE CHEST 1 VIEW COMPARISON:  Portable exam 1645 hours compared to 0816 hours FINDINGS: Minimal enlargement of cardiac silhouette. Atherosclerotic calcification aorta. Diffuse interstitial infiltrates throughout both lungs increased from earlier study of same date, favor pulmonary edema due to rapidity of change and  distribution. Infection is not completely excluded but is felt to be less likely. No pleural effusion or pneumothorax. IMPRESSION: Significant increase in diffuse interstitial infiltrates bilaterally since earlier exam of 01/09/2021 favoring pulmonary edema. Electronically Signed   By: Lavonia Dana M.D.   On: 01/09/2021 16:57   ECHOCARDIOGRAM COMPLETE  Result Date: 01/09/2021    ECHOCARDIOGRAM REPORT   Patient Name:   AMARILYS LYLES Date of Exam: 01/09/2021 Medical Rec #:  [id number]        Height:       66.0 in Accession #:    [id number]       Weight:       187.3 lb Date of Birth:  04-Aug-1941       BSA:          1.945 m Patient Age:    80 years         BP:           131/98 mmHg Patient Gender: F                HR:           92 bpm. Exam Location:  [facility] Procedure: 2D Echo, Color Doppler, Cardiac Doppler and Intracardiac            Opacification Agent Indications:     I21.4 NSTEMI  History:         Patient has prior history of Echocardiogram examinations, most                  recent 03/19/2020. CKD; Risk Factors:Hypertension, Diabetes and                  Dyslipidemia.  Sonographer:      Charmayne Sheer RDCS (AE) Referring Phys:  [id number] Soledad Gerlach NIU Diagnosing Phys: Bartholome Bill MD IMPRESSIONS  1. Left ventricular ejection fraction, by estimation, is 60 to 65%. The left ventricle has normal function. The left ventricle has no regional wall motion abnormalities. There is mild left ventricular hypertrophy. Left ventricular diastolic parameters are consistent with Grade I diastolic dysfunction (impaired relaxation).  2. Right ventricular systolic function is normal. The right ventricular size is normal.  3. The mitral valve is grossly normal. Mild mitral valve regurgitation.  4. The aortic valve is grossly normal. Aortic valve regurgitation is trivial. FINDINGS  Left Ventricle: Left ventricular ejection fraction, by estimation, is 60 to 65%. The left ventricle has normal function. The left ventricle has no regional wall motion abnormalities. Definity contrast agent was given IV to delineate the left ventricular  endocardial borders. The left ventricular internal cavity size was normal in size. There is mild left ventricular hypertrophy. Left ventricular diastolic parameters are consistent with Grade I diastolic dysfunction (impaired relaxation). Right Ventricle: The right ventricular size is normal. No increase in right ventricular wall thickness. Right ventricular systolic function is normal. Left Atrium: Left atrial size was normal in size. Right Atrium: Right atrial size was normal in size. Pericardium: There is no evidence of pericardial effusion. Mitral Valve: The mitral valve is grossly normal. Mild mitral valve regurgitation. MV peak gradient, 10.5 mmHg. The mean mitral valve gradient is 4.0 mmHg. Tricuspid Valve: The tricuspid valve is grossly normal. Tricuspid valve regurgitation is trivial. Aortic Valve: The aortic valve is grossly normal. Aortic valve regurgitation is trivial. Aortic valve mean gradient measures 4.0 mmHg. Aortic valve peak gradient measures 6.9 mmHg. Aortic valve area, by VTI measures  2.57 cm. Pulmonic Valve: The pulmonic valve was not well  visualized. Pulmonic valve regurgitation is trivial. Aorta: The aortic root is normal in size and structure. IAS/Shunts: The interatrial septum was not well visualized.  LEFT VENTRICLE PLAX 2D LVIDd:         4.93 cm     Diastology LVIDs:         3.69 cm     LV e' medial:    4.68 cm/s LV PW:         1.00 cm     LV E/e' medial:  25.6 LV IVS:        0.78 cm     LV e' lateral:   9.14 cm/s LVOT diam:     2.10 cm     LV E/e' lateral: 13.1 LV SV:         63 LV SV Index:   33 LVOT Area:     3.46 cm  LV Volumes (MOD) LV vol d, MOD A2C: 94.0 ml LV vol d, MOD A4C: 71.6 ml LV vol s, MOD A2C: 41.5 ml LV vol s, MOD A4C: 17.2 ml LV SV MOD A2C:     52.5 ml LV SV MOD A4C:     71.6 ml LV SV MOD BP:      58.0 ml RIGHT VENTRICLE RV Basal diam:  2.23 cm LEFT ATRIUM             Index       RIGHT ATRIUM          Index LA diam:        3.50 cm 1.80 cm/m  RA Area:     5.88 cm LA Vol (A2C):   43.8 ml 22.52 ml/m RA Volume:   7.50 ml  3.86 ml/m LA Vol (A4C):   47.7 ml 24.53 ml/m LA Biplane Vol: 46.2 ml 23.76 ml/m  AORTIC VALVE                   PULMONIC VALVE AV Area (Vmax):    2.42 cm    PV Vmax:       1.09 m/s AV Area (Vmean):   2.56 cm    PV Vmean:      69.600 cm/s AV Area (VTI):     2.57 cm    PV VTI:        0.175 m AV Vmax:           131.00 cm/s PV Peak grad:  4.8 mmHg AV Vmean:          87.800 cm/s PV Mean grad:  2.0 mmHg AV VTI:            0.247 m AV Peak Grad:      6.9 mmHg AV Mean Grad:      4.0 mmHg LVOT Vmax:         91.70 cm/s LVOT Vmean:        64.800 cm/s LVOT VTI:          0.183 m LVOT/AV VTI ratio: 0.74  AORTA Ao Root diam: 3.00 cm MITRAL VALVE MV Area (PHT): 5.27 cm     SHUNTS MV Area VTI:   2.28 cm     Systemic VTI:  0.18 m MV Peak grad:  10.5 mmHg    Systemic Diam: 2.10 cm MV Mean grad:  4.0 mmHg MV Vmax:       1.62 m/s MV Vmean:      97.9 cm/s MV Decel Time: 144 msec MV E velocity: 120.00 cm/s MV A velocity: 154.00 cm/s  MV E/A ratio:  0.78 Bartholome Bill MD  Electronically signed by Bartholome Bill MD Signature Date/Time: 01/09/2021/3:36:24 PM    Final     Assessment & Plan    1. Chest pain/elevated troponin  -186, 1020 respectively; 13,582. -Patient is currently chest pain free; due to AKI, will defer invasive workup with a LHC at this time; if renal function improves, may consider further workup early next week pending clinical status however currently, patient appears stable and would defer contrast study at present. -Echocardiogram showed preserved LV function with no high-grade valvular abnormalities. -We will discontinue heparin.  Sublingual nitroglycerin we will attempt to optimize medical management by increasing isosorbide mononitrate to 120 mg daily as well as ranolazine to 1000 mg twice daily and follow-up pressure and symptoms.  2. History of HFpEF  -Chest xray revealing possible edema, BNP slightly elevated but no clinical evidence of HF  3. Acute on chronic renal insufficiency  -Creatinine on admission 4.33; appreciate nephrology input.  4. Mitral insufficiency  -Mild moderate per recent echo;    Signed, Javier Docker. Dariane Natzke MD 01/11/2021, 6:11 PM  Pager: [phone]

## 2021-01-11 NOTE — Consult Note (Addendum)
[place] NOTE  Pharmacy Consult for Heparin Infusion Indication: chest pain/ACS  Patient Measurements: Heparin Dosing Weight: 75.7 kg  Labs: Recent Labs    01/09/21 0813 01/09/21 0955 01/09/21 1113 01/09/21 1426 01/09/21 1715 01/09/21 2210 01/09/21 2210 01/10/21 0556 01/10/21 1111 01/10/21 2015 01/11/21 0424  HGB 10.1*  --   --   --   --   --   --   --  8.4*  --  7.9*  HCT 32.2*  --   --   --   --   --   --   --  27.1*  --  25.0*  PLT 571*  --   --   --   --   --   --   --  517*  --  456*  APTT  --   --  25  --   --   --   --   --   --   --   --   LABPROT  --   --  12.5  --   --   --   --   --   --   --   --   INR  --   --  1.0  --   --   --   --   --   --   --   --   HEPARINUNFRC  --   --   --   --   --  0.13*   < >  --  0.27* 0.44 1.10*  CREATININE 4.33*  --   --   --   --   --   --  4.06*  --   --  3.85*  TROPONINIHS 186*   < >  --  5,201* 5,528* 09,381*  --   --   --   --   --    < > = values in this interval not displayed.    Estimated Creatinine Clearance: 13.1 mL/min (A) (by C-G formula based on SCr of 3.85 mg/dL (H)).   Medical History: Past Medical History:  Diagnosis Date  . Aortic valve stenosis   . Carotid bruit   . CHF (congestive heart failure) ([place])   . Chronic kidney disease   . Diabetes mellitus without complication ([place])   . GERD (gastroesophageal reflux disease)   . Heart murmur   . Hyperlipidemia   . Hypertension   . Pneumonia   . Vitamin D deficiency     Medications:  No anticoagulation prior to admission per my chart review  Assessment: Patient is an 80 y/o F with medical history as above and including three-vessel CAD who presented to the ED 1/14 with chest pain. Troponin 186 >> 1020. Pharmacy has been consulted to initiate heparin infusion for ACS.  Baseline CBC notable for Hgb 10.1, platelets 571. Baseline aPTT and PT-INR pending.   0114 2210 HL 0.13, SUBtherapeutic.  Will rebolus w/ 2000 units x 1 and increase  heparin drip to 1100 units/hr,  Goal of Therapy:  Heparin level 0.3-0.7 units/ml Monitor platelets by anticoagulation protocol: Yes   Plan:  --Heparin 4000 unit IV bolus x 1 followed by continuous infusion at 900 units/hr --Heparin level 8 hours after initiation of infusion --Daily CBC per protocol while on heparin infusion   0115 2015 HL 0.44, therapeutic.  Will continue heparin drip at 1250 units/hr, recheck HL in ~ 8 hrs to confirm.  Monitor CBC  0116 0424 HL 1.10, SUPRAtherapeutic despite no change in rate.  D/W nurse, lab was drawn from opposite arm from infusion site.  Will hold heparin x 1 hour then resume heparin at 1100 units/hr and recheck HL ~ 8 hours after drip resumed.  H/H low, PLTs stable.   [place] Crane, Mansel Strother A 01/11/2021,5:38 AM

## 2021-01-11 NOTE — Consult Note (Signed)
ANTICOAGULATION CONSULT NOTE  Pharmacy Consult for Heparin Infusion Indication: chest pain/ACS  Patient Measurements: Heparin Dosing Weight: 75.7 kg  Labs: Recent Labs    01/09/21 0813 01/09/21 0955 01/09/21 1113 01/09/21 1426 01/09/21 1715 01/09/21 2210 01/09/21 2210 01/10/21 0556 01/10/21 1111 01/10/21 2015 01/11/21 0424 01/11/21 1444  HGB 10.1*  --   --   --   --   --   --   --  8.4*  --  7.9* 8.6*  HCT 32.2*  --   --   --   --   --   --   --  27.1*  --  25.0* 26.6*  PLT 571*  --   --   --   --   --   --   --  517*  --  456*  --   APTT  --   --  25  --   --   --   --   --   --   --   --   --   LABPROT  --   --  12.5  --   --   --   --   --   --   --   --   --   INR  --   --  1.0  --   --   --   --   --   --   --   --   --   HEPARINUNFRC  --   --   --   --   --  0.13*   < >  --  0.27* 0.44 1.10* 0.70  CREATININE 4.33*  --   --   --   --   --   --  4.06*  --   --  3.85*  --   TROPONINIHS 186*   < >  --  5,201* 5,528* 40,981*  --   --   --   --   --   --    < > = values in this interval not displayed.    Estimated Creatinine Clearance: 13.1 mL/min (A) (by C-G formula based on SCr of 3.85 mg/dL (H)).   Medical History: Past Medical History:  Diagnosis Date  . Aortic valve stenosis   . Carotid bruit   . CHF (congestive heart failure) ([place])   . Chronic kidney disease   . Diabetes mellitus without complication ([place])   . GERD (gastroesophageal reflux disease)   . Heart murmur   . Hyperlipidemia   . Hypertension   . Pneumonia   . Vitamin D deficiency     Medications:  No anticoagulation prior to admission per my chart review  Assessment: Patient is an 80 y/o F with medical history as above and including three-vessel CAD who presented to the ED 1/14 with chest pain. Troponin 186 >> 1020. Pharmacy has been consulted to initiate heparin infusion for ACS.  Baseline CBC notable for Hgb 10.1, platelets 571. Baseline aPTT and PT-INR pending.   0114 2210 HL 0.13,  SUBtherapeutic.  Will rebolus w/ 2000 units x 1 and increase heparin drip to 1100 units/hr,  0115 2015 HL 0.44, therapeutic.  Will continue heparin drip at 1250 units/hr, recheck HL in ~ 8 hrs to confirm.  Monitor CBC  0116 0424 HL 1.10, SUPRAtherapeutic despite no change in rate.  D/W nurse, lab was drawn from opposite arm from infusion site.  Will hold heparin x 1 hour then resume heparin at 1100 units/hr  Goal of Therapy:  Heparin level 0.3-0.7 units/ml Monitor platelets by anticoagulation protocol: Yes   Plan:   0116 1444 HL 0.70, therapeutic but at upper end of range.   Will slightly decrease heparin drip to 1050 units/hr and check confirmatory HL ~ 8 hours . CBC w/ am labs  Brittney [name] A 01/11/2021,3:14 PM

## 2021-01-11 NOTE — TOC Progression Note (Signed)
Transition of Care [facility]) - Progression Note    Patient Details  Name: Brittney [name] MRN: [id number] Date of Birth: January 06, 1941  [facility]) CM/SW Contact  Izola Price, RN Phone Number: 01/11/2021, 12:25 PM  Clinical Narrative: 01/11/21 Admit 1/14 after having chest pain and SOB at home.  Cardiology consult done. Decreased renal function. DC > 3 days. PCP Elisabeth Cara Rx [facility] in [place] and [facility]. Monitor for TOC needs/DC planning.   Simmie Davies RN CM           Expected Discharge Plan and Services                                                 Social Determinants of Health (SDOH) Interventions    Readmission Risk Interventions Readmission Risk Prevention Plan 04/02/2020 08/10/2018  Transportation Screening Complete Complete  PCP or Specialist Appt within 5-7 Days - Complete  PCP or Specialist Appt within 3-5 Days Complete -  Home Care Screening - Complete  Medication Review (RN CM) - Complete  HRI or Home Care Consult Complete -  Medication Review (RN Care Manager) Complete -  Some recent data might be hidden

## 2021-01-11 NOTE — Progress Notes (Signed)
PROGRESS NOTE    Brittney [name]  WYO:[id number] DOB: January 07, 1941 DOA: 01/09/2021 PCP: Elisabeth Cara, NP   Brief Narrative:  This 80 years old female with PMH significant for hypertension, hyperlipidemia, DM, GERD, CKD stage IV, diastolic CHF, aortic valve stenosis, CAD presents with chest pain associated with shortness of breath.  Patient describes having chest pain since morning associated with shortness of breath,  pain radiating towards left side of chest.  She is found to have elevated troponins. VQ scan negative for PE.  She is admitted for non-STEMI, started on heparin.  Cardiology was consulted,  recommended there is no plan for any invasive intervention at this time since patient's chest pain has resolved, and worsening renal functions.  Nephrology was consulted there is no plan for urgent hemodialysis at this time,  continue to monitor serum creatinine.  Assessment & Plan:   Principal Problem:   NSTEMI (non-ST elevated myocardial infarction) ([place]) Active Problems:   Benign essential HTN   Type II diabetes mellitus with renal manifestations (HCC)   Hyperlipidemia   CAD (coronary artery disease)   Anemia in chronic kidney disease   Acute renal failure superimposed on stage 4 chronic kidney disease (HCC)   Chronic diastolic CHF (congestive heart failure) (HCC)   NSTEMI (non-ST elevated myocardial infarction) and hx of CAD: trop 186 -->1020.  Patient presented with chest pain associated with shortness of breath.  Found to have elevated troponins. Cardiology was consulted.  Patient was started on heparin GTT. Due to worsening renal functions, cannot do cardiac cath at this time. VQ scan negative for PE. Patient's chest pain has resolved. Cardiology recommended medical management at this time. No plan for invasive intervention at this time unless renal functions improved. admitted to progressive unit as inpatient Continue IV heparin Continue prn Nitroglycerin, Morphine, and  aspirin, lipitor, Imdur, metoprolol, Ranexa Risk factor stratification: will check FLP and A1C  2d echo: Normal LVEF 60 to 77% diastolic dysfunction stage I.  Benign essential HTN Continue metoprolol, amlodipine, hydralazine Hold Cozaar due to worsening renal function Continue IV hydralazine as needed  Type II diabetes mellitus with renal manifestations [facility]):  Recent A1c 10.5, poorly controlled.  Patient is taking metformin, Jardiance, glipizide and Lantus. Hold p.o. home diabetic medications. -Decrease Lantus dose from 16 to 12 units daily -Sliding scale insulin  Hyperlipidemia Continue Lipitor  Anemia in chronic kidney disease: Hgb stable, 10.1 Hemoglobin dropped from 8.4-7.9,  monitor H&H Transfuse PRBC if hemoglobin below 7  Acute renal failure superimposed on stage 4 chronic kidney disease ([place]):  Improving Baseline creatinine 2.83 on 10/14/2020.  His creatinine on admission 4.33, BUN 50. Nephrology was consulted recommended there is no need for urgent hemodialysis at this time  Continue to monitor renal functions.  Serum creatinine improving. -Hold torsemide, Cozaar, Similac total  Chronic diastolic CHF (congestive heart failure) ([place]): 2D echo 03/19/2020 showed EF of 60 to 35% with grade 1 diastolic dysfunction.  Patient has trace leg edema, BNP 176, chest x-ray showed interstitial edema.  Initially patient was doing fine, but after giving IV fluid due to worsening renal function, patient developed respiratory distress.  BiPAP started.  The repeated chest that showed worsening interstitial edema. -will give 1 dose of Lasix 40 mg. -Continue BiPAP  DVT prophylaxis: Heparin gtt Code Status: Full code Family Communication: No family at bedside Disposition Plan:  Status is: Inpatient  Remains inpatient appropriate because:Inpatient level of care appropriate due to severity of illness   Dispo: The patient is from: Home  Anticipated d/c is to: Home               Anticipated d/c date is: > 3 days              Patient currently is not medically stable to d/c.  Consultants:   Cardiology  Nephrology  Procedures:  Antimicrobials :  Anti-infectives (From admission, onward)   None     Subjective: Patient was seen and examined at bedside.  Overnight events noted. She appears comfortable, having breakfast in the morning, denied any chest pain or shortness of breath.  Objective: Vitals:   01/10/21 2030 01/11/21 0018 01/11/21 0500 01/11/21 0729  BP: (!) 118/52 (!) 116/54 119/60 122/66  Pulse: 78 81 87 86  Resp: 18 20 18  (!) 21  Temp:  98.6 F (37 C) 99.2 F (37.3 C) 98.9 F (37.2 C)  TempSrc:  Oral Oral Oral  SpO2: 100% 97% 100% 100%  Weight:      Height:        Intake/Output Summary (Last 24 hours) at 01/11/2021 1127 Last data filed at 01/11/2021 0656 Gross per 24 hour  Intake 986.73 ml  Output 700 ml  Net 286.73 ml   Filed Weights   01/09/21 0807 01/09/21 1001 01/10/21 1915  Weight: 79.4 kg 85 kg 85.9 kg    Examination:  General exam: Appears calm and comfortable, not in any acute distress Respiratory system: Clear to auscultation. Respiratory effort normal. Cardiovascular system: S1 & S2 heard, RRR. No JVD, murmurs, rubs, gallops or clicks. No pedal edema. Gastrointestinal system: Abdomen is nondistended, soft and nontender. No organomegaly or masses felt. Normal bowel sounds heard. Central nervous system: Alert and oriented. No focal neurological deficits. Extremities: Symmetric 5 x 5 power. Skin: No rashes, lesions or ulcers Psychiatry: Judgement and insight appear normal. Mood & affect appropriate.     Data Reviewed: I have personally reviewed following labs and imaging studies  CBC: Recent Labs  Lab 01/09/21 0813 01/10/21 1111 01/11/21 0424  WBC 9.5 9.0 12.0*  HGB 10.1* 8.4* 7.9*  HCT 32.2* 27.1* 25.0*  MCV 80.5 79.7* 80.1  PLT 571* 517* 287*   Basic Metabolic Panel: Recent Labs  Lab 01/09/21 0813  01/10/21 0556 01/11/21 0424  NA 136 139 134*  K 4.7 4.4 4.5  CL 100 104 101  CO2 21* 24 23  GLUCOSE 297* 219* 250*  BUN 50* 49* 43*  CREATININE 4.33* 4.06* 3.85*  CALCIUM 8.9 8.8* 8.7*  MG  --   --  2.6*  PHOS  --   --  3.0   GFR: Estimated Creatinine Clearance: 13.1 mL/min (A) (by C-G formula based on SCr of 3.85 mg/dL (H)). Liver Function Tests: Recent Labs  Lab 01/09/21 1715  AST 55*  ALT 20  ALKPHOS 31*  BILITOT 0.6  PROT 7.2  ALBUMIN 3.3*   No results for input(s): LIPASE, AMYLASE in the last 168 hours. No results for input(s): AMMONIA in the last 168 hours. Coagulation Profile: Recent Labs  Lab 01/09/21 1113  INR 1.0   Cardiac Enzymes: No results for input(s): CKTOTAL, CKMB, CKMBINDEX, TROPONINI in the last 168 hours. BNP (last 3 results) No results for input(s): PROBNP in the last 8760 hours. HbA1C: Recent Labs    01/10/21 0556  HGBA1C 8.3*   CBG: Recent Labs  Lab 01/09/21 2206 01/10/21 1010 01/10/21 1154 01/10/21 2034 01/11/21 0728  GLUCAP 265* 194* 285* 228* 216*   Lipid Profile: Recent Labs    01/10/21 0556  CHOL  145  HDL 31*  LDLCALC 69  TRIG 225*  CHOLHDL 4.7   Thyroid Function Tests: No results for input(s): TSH, T4TOTAL, FREET4, T3FREE, THYROIDAB in the last 72 hours. Anemia Panel: No results for input(s): VITAMINB12, FOLATE, FERRITIN, TIBC, IRON, RETICCTPCT in the last 72 hours. Sepsis Labs: No results for input(s): PROCALCITON, LATICACIDVEN in the last 168 hours.  Recent Results (from the past 240 hour(s))  Resp Panel by RT-PCR (Flu A&B, Covid) Nasopharyngeal Swab     Status: None   Collection Time: 01/09/21  9:55 AM   Specimen: Nasopharyngeal Swab; Nasopharyngeal(NP) swabs in vial transport medium  Result Value Ref Range Status   SARS Coronavirus 2 by RT PCR NEGATIVE NEGATIVE Final    Comment: (NOTE) SARS-CoV-2 target nucleic acids are NOT DETECTED.  The SARS-CoV-2 RNA is generally detectable in upper  respiratory specimens during the acute phase of infection. The lowest concentration of SARS-CoV-2 viral copies this assay can detect is 138 copies/mL. A negative result does not preclude SARS-Cov-2 infection and should not be used as the sole basis for treatment or other patient management decisions. A negative result may occur with  improper specimen collection/handling, submission of specimen other than nasopharyngeal swab, presence of viral mutation(s) within the areas targeted by this assay, and inadequate number of viral copies(<138 copies/mL). A negative result must be combined with clinical observations, patient history, and epidemiological information. The expected result is Negative.  Fact Sheet for Patients:  EntrepreneurPulse.com.au  Fact Sheet for Healthcare Providers:  IncredibleEmployment.be  This test is no t yet approved or cleared by the [place] FDA and  has been authorized for detection and/or diagnosis of SARS-CoV-2 by FDA under an Emergency Use Authorization (EUA). This EUA will remain  in effect (meaning this test can be used) for the duration of the COVID-19 declaration under Section 564(b)(1) of the Act, 21 U.S.C.section 360bbb-3(b)(1), unless the authorization is terminated  or revoked sooner.       Influenza A by PCR NEGATIVE NEGATIVE Final   Influenza B by PCR NEGATIVE NEGATIVE Final    Comment: (NOTE) The Xpert Xpress SARS-CoV-2/FLU/RSV plus assay is intended as an aid in the diagnosis of influenza from Nasopharyngeal swab specimens and should not be used as a sole basis for treatment. Nasal washings and aspirates are unacceptable for Xpert Xpress SARS-CoV-2/FLU/RSV testing.  Fact Sheet for Patients: EntrepreneurPulse.com.au  Fact Sheet for Healthcare Providers: IncredibleEmployment.be  This test is not yet approved or cleared by the [place] FDA and has been  authorized for detection and/or diagnosis of SARS-CoV-2 by FDA under an Emergency Use Authorization (EUA). This EUA will remain in effect (meaning this test can be used) for the duration of the COVID-19 declaration under Section 564(b)(1) of the Act, 21 U.S.C. section 360bbb-3(b)(1), unless the authorization is terminated or revoked.  Performed at [facility], [address] [place], [place] [address]      Radiology Studies: NM Pulmonary Perfusion  Result Date: 01/09/2021 CLINICAL DATA:  Chest pressure and shortness of breath today. EXAM: NUCLEAR MEDICINE PERFUSION LUNG SCAN TECHNIQUE: Perfusion images were obtained in multiple projections after intravenous injection of radiopharmaceutical. Ventilation scans intentionally deferred if perfusion scan and chest x-ray adequate for interpretation during COVID 19 epidemic. RADIOPHARMACEUTICALS:  4.13 mCi Tc-38m MAA IV COMPARISON:  PA and lateral chest today. FINDINGS: Perfusion appears normal without segmental or subsegmental defect. IMPRESSION: Negative for PE. Electronically Signed   By: Inge Rise M.D.   On: 01/09/2021 13:32   DG Chest  Portable 1 View  Result Date: 01/09/2021 CLINICAL DATA:  Hypoxemia EXAM: PORTABLE CHEST 1 VIEW COMPARISON:  Portable exam 1645 hours compared to 0816 hours FINDINGS: Minimal enlargement of cardiac silhouette. Atherosclerotic calcification aorta. Diffuse interstitial infiltrates throughout both lungs increased from earlier study of same date, favor pulmonary edema due to rapidity of change and distribution. Infection is not completely excluded but is felt to be less likely. No pleural effusion or pneumothorax. IMPRESSION: Significant increase in diffuse interstitial infiltrates bilaterally since earlier exam of 01/09/2021 favoring pulmonary edema. Electronically Signed   By: Lavonia Dana M.D.   On: 01/09/2021 16:57   ECHOCARDIOGRAM COMPLETE  Result Date: 01/09/2021    ECHOCARDIOGRAM REPORT   Patient  Name:   Brittney [name] Date of Exam: 01/09/2021 Medical Rec #:  [id number]        Height:       66.0 in Accession #:    [id number]       Weight:       187.3 lb Date of Birth:  1941/04/24       BSA:          1.945 m Patient Age:    66 years         BP:           131/98 mmHg Patient Gender: F                HR:           92 bpm. Exam Location:  [facility] Procedure: 2D Echo, Color Doppler, Cardiac Doppler and Intracardiac            Opacification Agent Indications:     I21.4 NSTEMI  History:         Patient has prior history of Echocardiogram examinations, most                  recent 03/19/2020. CKD; Risk Factors:Hypertension, Diabetes and                  Dyslipidemia.  Sonographer:     Charmayne Sheer RDCS (AE) Referring Phys:  [id number] Soledad Gerlach NIU Diagnosing Phys: Bartholome Bill MD IMPRESSIONS  1. Left ventricular ejection fraction, by estimation, is 60 to 65%. The left ventricle has normal function. The left ventricle has no regional wall motion abnormalities. There is mild left ventricular hypertrophy. Left ventricular diastolic parameters are consistent with Grade I diastolic dysfunction (impaired relaxation).  2. Right ventricular systolic function is normal. The right ventricular size is normal.  3. The mitral valve is grossly normal. Mild mitral valve regurgitation.  4. The aortic valve is grossly normal. Aortic valve regurgitation is trivial. FINDINGS  Left Ventricle: Left ventricular ejection fraction, by estimation, is 60 to 65%. The left ventricle has normal function. The left ventricle has no regional wall motion abnormalities. Definity contrast agent was given IV to delineate the left ventricular  endocardial borders. The left ventricular internal cavity size was normal in size. There is mild left ventricular hypertrophy. Left ventricular diastolic parameters are consistent with Grade I diastolic dysfunction (impaired relaxation). Right Ventricle: The right ventricular size is normal. No increase in right ventricular  wall thickness. Right ventricular systolic function is normal. Left Atrium: Left atrial size was normal in size. Right Atrium: Right atrial size was normal in size. Pericardium: There is no evidence of pericardial effusion. Mitral Valve: The mitral valve is grossly normal. Mild mitral valve regurgitation. MV peak gradient, 10.5 mmHg. The mean mitral valve gradient is 4.0  mmHg. Tricuspid Valve: The tricuspid valve is grossly normal. Tricuspid valve regurgitation is trivial. Aortic Valve: The aortic valve is grossly normal. Aortic valve regurgitation is trivial. Aortic valve mean gradient measures 4.0 mmHg. Aortic valve peak gradient measures 6.9 mmHg. Aortic valve area, by VTI measures 2.57 cm. Pulmonic Valve: The pulmonic valve was not well visualized. Pulmonic valve regurgitation is trivial. Aorta: The aortic root is normal in size and structure. IAS/Shunts: The interatrial septum was not well visualized.  LEFT VENTRICLE PLAX 2D LVIDd:         4.93 cm     Diastology LVIDs:         3.69 cm     LV e' medial:    4.68 cm/s LV PW:         1.00 cm     LV E/e' medial:  25.6 LV IVS:        0.78 cm     LV e' lateral:   9.14 cm/s LVOT diam:     2.10 cm     LV E/e' lateral: 13.1 LV SV:         63 LV SV Index:   33 LVOT Area:     3.46 cm  LV Volumes (MOD) LV vol d, MOD A2C: 94.0 ml LV vol d, MOD A4C: 71.6 ml LV vol s, MOD A2C: 41.5 ml LV vol s, MOD A4C: 17.2 ml LV SV MOD A2C:     52.5 ml LV SV MOD A4C:     71.6 ml LV SV MOD BP:      58.0 ml RIGHT VENTRICLE RV Basal diam:  2.23 cm LEFT ATRIUM             Index       RIGHT ATRIUM          Index LA diam:        3.50 cm 1.80 cm/m  RA Area:     5.88 cm LA Vol (A2C):   43.8 ml 22.52 ml/m RA Volume:   7.50 ml  3.86 ml/m LA Vol (A4C):   47.7 ml 24.53 ml/m LA Biplane Vol: 46.2 ml 23.76 ml/m  AORTIC VALVE                   PULMONIC VALVE AV Area (Vmax):    2.42 cm    PV Vmax:       1.09 m/s AV Area (Vmean):   2.56 cm    PV Vmean:      69.600 cm/s AV Area (VTI):     2.57 cm     PV VTI:        0.175 m AV Vmax:           131.00 cm/s PV Peak grad:  4.8 mmHg AV Vmean:          87.800 cm/s PV Mean grad:  2.0 mmHg AV VTI:            0.247 m AV Peak Grad:      6.9 mmHg AV Mean Grad:      4.0 mmHg LVOT Vmax:         91.70 cm/s LVOT Vmean:        64.800 cm/s LVOT VTI:          0.183 m LVOT/AV VTI ratio: 0.74  AORTA Ao Root diam: 3.00 cm MITRAL VALVE MV Area (PHT): 5.27 cm     SHUNTS MV Area VTI:   2.28 cm  Systemic VTI:  0.18 m MV Peak grad:  10.5 mmHg    Systemic Diam: 2.10 cm MV Mean grad:  4.0 mmHg MV Vmax:       1.62 m/s MV Vmean:      97.9 cm/s MV Decel Time: 144 msec MV E velocity: 120.00 cm/s MV A velocity: 154.00 cm/s MV E/A ratio:  0.78 Bartholome Bill MD Electronically signed by Bartholome Bill MD Signature Date/Time: 01/09/2021/3:36:24 PM    Final     Scheduled Meds: . amLODipine  10 mg Oral Daily  . aspirin EC  81 mg Oral Daily  . atorvastatin  40 mg Oral Daily  . calcitRIOL  0.25 mcg Oral Daily  . hydrALAZINE  25 mg Oral BID  . insulin aspart  0-5 Units Subcutaneous QHS  . insulin aspart  0-9 Units Subcutaneous TID WC  . insulin glargine  12 Units Subcutaneous QHS  . isosorbide mononitrate  120 mg Oral Daily  . isosorbide mononitrate  120 mg Oral Once  . loratadine  10 mg Oral Daily  . metoprolol succinate  100 mg Oral Daily  . multivitamin with minerals  1 tablet Oral Daily  . pantoprazole  40 mg Oral Daily  . ranolazine  1,000 mg Oral BID   Continuous Infusions: . heparin 1,100 Units/hr (01/11/21 0656)  . nitroGLYCERIN Stopped (01/11/21 0845)     LOS: 2 days    Time spent: 25 mins    Iva Posten, MD [facility]   If 7PM-7AM, please contact night-coverage

## 2021-01-12 LAB — GLUCOSE, CAPILLARY
Glucose-Capillary: 185 mg/dL — ABNORMAL HIGH (ref 70–99)
Glucose-Capillary: 256 mg/dL — ABNORMAL HIGH (ref 70–99)
Glucose-Capillary: 221 mg/dL — ABNORMAL HIGH (ref 70–99)
Glucose-Capillary: 281 mg/dL — ABNORMAL HIGH (ref 70–99)

## 2021-01-12 LAB — CBC
HCT: 25.5 % — ABNORMAL LOW (ref 36.0–46.0)
Hemoglobin: 7.9 g/dL — ABNORMAL LOW (ref 12.0–15.0)
MCH: 25 pg — ABNORMAL LOW (ref 26.0–34.0)
MCHC: 31 g/dL (ref 30.0–36.0)
MCV: 80.7 fL (ref 80.0–100.0)
Platelets: 475 10*3/uL — ABNORMAL HIGH (ref 150–400)
RBC: 3.16 MIL/uL — ABNORMAL LOW (ref 3.87–5.11)
RDW: 16 % — ABNORMAL HIGH (ref 11.5–15.5)
WBC: 10.9 10*3/uL — ABNORMAL HIGH (ref 4.0–10.5)
nRBC: 0 % (ref 0.0–0.2)

## 2021-01-12 LAB — BASIC METABOLIC PANEL
Anion gap: 9 (ref 5–15)
BUN: 40 mg/dL — ABNORMAL HIGH (ref 8–23)
CO2: 24 mmol/L (ref 22–32)
Calcium: 8.8 mg/dL — ABNORMAL LOW (ref 8.9–10.3)
Chloride: 101 mmol/L (ref 98–111)
Creatinine, Ser: 4.01 mg/dL — ABNORMAL HIGH (ref 0.44–1.00)
GFR, Estimated: 11 mL/min — ABNORMAL LOW (ref >60)
Glucose, Bld: 214 mg/dL — ABNORMAL HIGH (ref 70–99)
Potassium: 5.1 mmol/L (ref 3.5–5.1)
Sodium: 134 mmol/L — ABNORMAL LOW (ref 135–145)

## 2021-01-12 LAB — MAGNESIUM: Magnesium: 2.7 mg/dL — ABNORMAL HIGH (ref 1.7–2.4)

## 2021-01-12 LAB — PHOSPHORUS: Phosphorus: 3.8 mg/dL (ref 2.5–4.6)

## 2021-01-12 NOTE — Progress Notes (Signed)
PROGRESS NOTE    OSIA AYE  [id number] DOB: [birth year] DOA: 01/09/2021 PCP: Elisabeth Cara, NP   Brief Narrative:  This 80 years old female with PMH significant for hypertension, hyperlipidemia, DM, GERD, CKD stage IV, diastolic CHF, aortic valve stenosis, CAD presents with chest pain associated with shortness of breath.  Patient describes having chest pain since morning associated with shortness of breath,  pain radiating towards left side of chest.  She is found to have elevated troponins. VQ scan negative for PE.  She is admitted for non-STEMI, started on heparin.  Cardiology was consulted,  recommended there is no plan for any invasive intervention at this time since patient's chest pain has resolved, and worsening renal functions.  Nephrology was consulted there is no plan for urgent hemodialysis at this time,  continue to monitor serum creatinine.  Assessment & Plan:   Principal Problem:   NSTEMI (non-ST elevated myocardial infarction) ([place]) Active Problems:   Benign essential HTN   Type II diabetes mellitus with renal manifestations (HCC)   Hyperlipidemia   CAD (coronary artery disease)   Anemia in chronic kidney disease   Acute renal failure superimposed on stage 4 chronic kidney disease (HCC)   Chronic diastolic CHF (congestive heart failure) (HCC)   NSTEMI (non-ST elevated myocardial infarction) and hx of CAD: trop 186 -->1020.  Patient presented with chest pain associated with shortness of breath.  Found to have elevated troponins. Cardiology was consulted.  Patient was started on heparin GTT. Due to worsening renal functions, cannot do cardiac cath at this time. VQ scan negative for PE. Patient's chest pain has resolved. Cardiology recommended medical management at this time. No plan for invasive intervention at this time unless renal functions improved. admitted to progressive unit as inpatient Discontinue heparin after 48 hours. Continue prn Nitroglycerin,  Morphine, and aspirin, lipitor, Imdur, metoprolol, Ranexa Risk factor stratification: will check FLP and A1C  2d echo: Normal LVEF 60 to [id number] diastolic dysfunction stage I.  Benign essential HTN Continue metoprolol, amlodipine, hydralazine Hold Cozaar due to worsening renal function Continue IV hydralazine as needed  Type II diabetes mellitus with renal manifestations [facility]):  Recent A1c 10.5, poorly controlled.  Patient is taking metformin, Jardiance, glipizide and Lantus. Hold p.o. home diabetic medications. Decrease Lantus dose from 16 to 12 units daily Sliding scale insulin  Hyperlipidemia Continue Lipitor  Anemia in chronic kidney disease: Hgb stable, 10.1 Hemoglobin dropped from 8.4-7.9,  monitor H&H Transfuse PRBC if hemoglobin below 7  Acute renal failure superimposed on stage 4 chronic kidney disease [facility]):  Improving Baseline creatinine 2.83 on 10/14/2020.  His creatinine on admission 4.33, BUN 50. Nephrology was consulted recommended there is no need for urgent hemodialysis at this time  Continue to monitor renal functions.  Serum creatinine trending up. Explained to the patient she may require hemodialysis during this admission. -Hold torsemide, Cozaar, Similac total  Chronic diastolic CHF (congestive heart failure) ([place]): 2D echo 03/19/2020 showed EF of 60 to 35% with grade 1 diastolic dysfunction.  Patient has trace leg edema, BNP 176, chest x-ray showed interstitial edema.  Initially patient was doing fine, but after giving IV fluid due to worsening renal function, patient developed respiratory distress.  BiPAP started.  The repeated chest that showed worsening interstitial edema. -will give 1 dose of Lasix 40 mg. -Continue BiPAP  DVT prophylaxis: Heparin gtt Code Status: Full code Family Communication: No family at bedside Disposition Plan:  Status is: Inpatient  Remains inpatient appropriate because:Inpatient level of care  appropriate due to severity of  illness   Dispo: The patient is from: Home              Anticipated d/c is to: Home              Anticipated d/c date is: > 3 days              Patient currently is not medically stable to d/c.  Consultants:   Cardiology  Nephrology  Procedures:  Antimicrobials :  Anti-infectives (From admission, onward)   None     Subjective: Patient was seen and examined at bedside.  Overnight events noted.  She was having breakfast in the morning.  She reports feeling better, denies any chest pain or shortness of breath.  Objective: Vitals:   01/11/21 1524 01/11/21 2100 01/12/21 0729 01/12/21 1138  BP: 130/71 119/61 132/61 (!) 126/53  Pulse: 85 80 90 86  Resp: '20 19 18 20  '$ Temp: 98.6 F (37 C) 98.5 F (36.9 C) 98.4 F (36.9 C) 98.5 F (36.9 C)  TempSrc: Oral Oral Oral Oral  SpO2: 100% 96% 97% 100%  Weight:  85.2 kg    Height:        Intake/Output Summary (Last 24 hours) at 01/12/2021 1237 Last data filed at 01/12/2021 1139 Gross per 24 hour  Intake 720 ml  Output 1200 ml  Net -480 ml   Filed Weights   01/09/21 1001 01/10/21 1915 01/11/21 2100  Weight: 85 kg 85.9 kg 85.2 kg    Examination:  General exam: Appears calm and comfortable, not in any acute distress Respiratory system: Clear to auscultation. Respiratory effort normal. Cardiovascular system: S1 & S2 heard, RRR. No JVD, murmurs, rubs, gallops or clicks. No pedal edema. Gastrointestinal system: Abdomen is nondistended, soft and nontender. No organomegaly or masses felt. Normal bowel sounds heard. Central nervous system: Alert and oriented. No focal neurological deficits. Extremities: Symmetric 5 x 5 power. Skin: No rashes, lesions or ulcers Psychiatry: Judgement and insight appear normal. Mood & affect appropriate.     Data Reviewed: I have personally reviewed following labs and imaging studies  CBC: Recent Labs  Lab 01/09/21 0813 01/10/21 1111 01/11/21 0424 01/11/21 1444 01/12/21 0402  WBC 9.5 9.0  12.0*  --  10.9*  HGB 10.1* 8.4* 7.9* 8.6* 7.9*  HCT 32.2* 27.1* 25.0* 26.6* 25.5*  MCV 80.5 79.7* 80.1  --  80.7  PLT 571* 517* 456*  --  [id number]*   Basic Metabolic Panel: Recent Labs  Lab 01/09/21 0813 01/10/21 0556 01/11/21 0424 01/12/21 0402  NA 136 139 134* 134*  K 4.7 4.4 4.5 5.1  CL 100 104 101 101  CO2 21* '24 23 24  '$ GLUCOSE 297* 219* 250* 214*  BUN 50* 49* 43* 40*  CREATININE 4.33* 4.06* 3.85* 4.01*  CALCIUM 8.9 8.8* 8.7* 8.8*  MG  --   --  2.6* 2.7*  PHOS  --   --  3.0 3.8   GFR: Estimated Creatinine Clearance: 12.5 mL/min (A) (by C-G formula based on SCr of 4.01 mg/dL (H)). Liver Function Tests: Recent Labs  Lab 01/09/21 1715  AST 55*  ALT 20  ALKPHOS 31*  BILITOT 0.6  PROT 7.2  ALBUMIN 3.3*   No results for input(s): LIPASE, AMYLASE in the last 168 hours. No results for input(s): AMMONIA in the last 168 hours. Coagulation Profile: Recent Labs  Lab 01/09/21 1113  INR 1.0   Cardiac Enzymes: No results for input(s): CKTOTAL, CKMB, CKMBINDEX, TROPONINI in  the last 168 hours. BNP (last 3 results) No results for input(s): PROBNP in the last 8760 hours. HbA1C: Recent Labs    01/10/21 0556  HGBA1C 8.3*   CBG: Recent Labs  Lab 01/11/21 1150 01/11/21 1628 01/11/21 2109 01/12/21 0728 01/12/21 1134  GLUCAP 266* 169* 162* 185* 256*   Lipid Profile: Recent Labs    01/10/21 0556  CHOL 145  HDL 31*  LDLCALC 69  TRIG 225*  CHOLHDL 4.7   Thyroid Function Tests: No results for input(s): TSH, T4TOTAL, FREET4, T3FREE, THYROIDAB in the last 72 hours. Anemia Panel: No results for input(s): VITAMINB12, FOLATE, FERRITIN, TIBC, IRON, RETICCTPCT in the last 72 hours. Sepsis Labs: No results for input(s): PROCALCITON, LATICACIDVEN in the last 168 hours.  Recent Results (from the past 240 hour(s))  Resp Panel by RT-PCR (Flu A&B, Covid) Nasopharyngeal Swab     Status: None   Collection Time: 01/09/21  9:55 AM   Specimen: Nasopharyngeal Swab;  Nasopharyngeal(NP) swabs in vial transport medium  Result Value Ref Range Status   SARS Coronavirus 2 by RT PCR NEGATIVE NEGATIVE Final    Comment: (NOTE) SARS-CoV-2 target nucleic acids are NOT DETECTED.  The SARS-CoV-2 RNA is generally detectable in upper respiratory specimens during the acute phase of infection. The lowest concentration of SARS-CoV-2 viral copies this assay can detect is 138 copies/mL. A negative result does not preclude SARS-Cov-2 infection and should not be used as the sole basis for treatment or other patient management decisions. A negative result may occur with  improper specimen collection/handling, submission of specimen other than nasopharyngeal swab, presence of viral mutation(s) within the areas targeted by this assay, and inadequate number of viral copies(<138 copies/mL). A negative result must be combined with clinical observations, patient history, and epidemiological information. The expected result is Negative.  Fact Sheet for Patients:  EntrepreneurPulse.com.au  Fact Sheet for Healthcare Providers:  IncredibleEmployment.be  This test is no t yet approved or cleared by the [place] FDA and  has been authorized for detection and/or diagnosis of SARS-CoV-2 by FDA under an Emergency Use Authorization (EUA). This EUA will remain  in effect (meaning this test can be used) for the duration of the COVID-19 declaration under Section 564(b)(1) of the Act, 21 U.S.C.section 360bbb-3(b)(1), unless the authorization is terminated  or revoked sooner.       Influenza A by PCR NEGATIVE NEGATIVE Final   Influenza B by PCR NEGATIVE NEGATIVE Final    Comment: (NOTE) The Xpert Xpress SARS-CoV-2/FLU/RSV plus assay is intended as an aid in the diagnosis of influenza from Nasopharyngeal swab specimens and should not be used as a sole basis for treatment. Nasal washings and aspirates are unacceptable for Xpert Xpress  SARS-CoV-2/FLU/RSV testing.  Fact Sheet for Patients: EntrepreneurPulse.com.au  Fact Sheet for Healthcare Providers: IncredibleEmployment.be  This test is not yet approved or cleared by the [place] FDA and has been authorized for detection and/or diagnosis of SARS-CoV-2 by FDA under an Emergency Use Authorization (EUA). This EUA will remain in effect (meaning this test can be used) for the duration of the COVID-19 declaration under Section 564(b)(1) of the Act, 21 U.S.C. section 360bbb-3(b)(1), unless the authorization is terminated or revoked.  Performed at [facility], [address] [place], [place] [address]      Radiology Studies: No results found.  Scheduled Meds: . amLODipine  10 mg Oral Daily  . aspirin EC  81 mg Oral Daily  . atorvastatin  40 mg Oral Daily  .  calcitRIOL  0.25 mcg Oral Daily  . hydrALAZINE  25 mg Oral BID  . insulin aspart  0-5 Units Subcutaneous QHS  . insulin aspart  0-9 Units Subcutaneous TID WC  . insulin glargine  12 Units Subcutaneous QHS  . isosorbide mononitrate  120 mg Oral Daily  . loratadine  10 mg Oral Daily  . metoprolol succinate  100 mg Oral Daily  . multivitamin with minerals  1 tablet Oral Daily  . pantoprazole  40 mg Oral Daily  . ranolazine  1,000 mg Oral BID   Continuous Infusions:    LOS: 3 days    Time spent: 25 mins    Shawna Clamp, MD [facility]   If 7PM-7AM, please contact night-coverage

## 2021-01-12 NOTE — Progress Notes (Signed)
Patient Name: Brittney [name] Date of Encounter: 01/12/2021  Hospital Problem List     Principal Problem:   NSTEMI (non-ST elevated myocardial infarction) [facility]) Active Problems:   Benign essential HTN   Type II diabetes mellitus with renal manifestations (HCC)   Hyperlipidemia   CAD (coronary artery disease)   Anemia in chronic kidney disease   Acute renal failure superimposed on stage 4 chronic kidney disease (HCC)   Chronic diastolic CHF (congestive heart failure) [facility])    Patient Profile      80 year old female with a past medical history significant for coronary artery disease, HFpEF, aortic valve stenosis, type 2 diabetes, chronic kidney disease, hyperlipidemia, and hypertension who presented to the ED on 01/09/21 for a one day history of chest pain with associated shortness of breath. Workup in the ED has been significant for ECG revealing sinus tachycardia, rate of 111bpm, with nonspecific ST abnormalities, chest xray revealing suspected acute interstitial edema, high sensitivity troponin 186 and 1020 respectively, BNP of 176, COVID-19 negative, creatinine of 4.33, and a GFR of 10.   She is followed in outpatient cardiology by Dr. Clayborn Bigness. Most recent echocardiogram on 11/12/20 revealed normal RV and LV systolic function with an EF estimated greater than 55% with moderate MR, mild TR, mild AR, and mild LVH. Stress test on 06/25/20 revealed no evidence of ischemia. Left heart catheterization on 01/23/19 revealed moderate to severe disease in the proximal LAD, 70% stenosed, as well as 70% stenosis in the distal LAD. RCA had a 99% mid to distal lesion with calcification  Subjective   No chest pain at present.  Less short of breath.  Inpatient Medications    . amLODipine  10 mg Oral Daily  . aspirin EC  81 mg Oral Daily  . atorvastatin  40 mg Oral Daily  . calcitRIOL  0.25 mcg Oral Daily  . hydrALAZINE  25 mg Oral BID  . insulin aspart  0-5 Units Subcutaneous QHS  .  insulin aspart  0-9 Units Subcutaneous TID WC  . insulin glargine  12 Units Subcutaneous QHS  . isosorbide mononitrate  120 mg Oral Daily  . loratadine  10 mg Oral Daily  . metoprolol succinate  100 mg Oral Daily  . multivitamin with minerals  1 tablet Oral Daily  . pantoprazole  40 mg Oral Daily  . ranolazine  1,000 mg Oral BID    Vital Signs    Vitals:   01/11/21 1524 01/11/21 2100 01/12/21 0729 01/12/21 1138  BP: 130/71 119/61 132/61 (!) 126/53  Pulse: 85 80 90 86  Resp: 20 19 18 20   Temp: 98.6 F (37 C) 98.5 F (36.9 C) 98.4 F (36.9 C) 98.5 F (36.9 C)  TempSrc: Oral Oral Oral Oral  SpO2: 100% 96% 97% 100%  Weight:  85.2 kg    Height:        Intake/Output Summary (Last 24 hours) at 01/12/2021 1434 Last data filed at 01/12/2021 1139 Gross per 24 hour  Intake 600 ml  Output 1200 ml  Net -600 ml   Filed Weights   01/09/21 1001 01/10/21 1915 01/11/21 2100  Weight: 85 kg 85.9 kg 85.2 kg    Physical Exam    GEN: Well nourished, well developed, in no acute distress.  HEENT: normal.  Neck: Supple, no JVD, carotid bruits, or masses. Cardiac: RRR, no murmurs, rubs, or gallops. No clubbing, cyanosis, edema.  Radials/DP/PT 2+ and equal bilaterally.  Respiratory:  Respirations regular and unlabored, clear to  auscultation bilaterally. GI: Soft, nontender, nondistended, BS + x 4. MS: no deformity or atrophy. Skin: warm and dry, no rash. Neuro:  Strength and sensation are intact. Psych: Normal affect.  Labs    CBC Recent Labs    01/11/21 0424 01/11/21 1444 01/12/21 0402  WBC 12.0*  --  10.9*  HGB 7.9* 8.6* 7.9*  HCT 25.0* 26.6* 25.5*  MCV 80.1  --  80.7  PLT 456*  --  702*   Basic Metabolic Panel Recent Labs    01/11/21 0424 01/12/21 0402  NA 134* 134*  K 4.5 5.1  CL 101 101  CO2 23 24  GLUCOSE 250* 214*  BUN 43* 40*  CREATININE 3.85* 4.01*  CALCIUM 8.7* 8.8*  MG 2.6* 2.7*  PHOS 3.0 3.8   Liver Function Tests Recent Labs    01/09/21 1715  AST  55*  ALT 20  ALKPHOS 31*  BILITOT 0.6  PROT 7.2  ALBUMIN 3.3*   No results for input(s): LIPASE, AMYLASE in the last 72 hours. Cardiac Enzymes No results for input(s): CKTOTAL, CKMB, CKMBINDEX, TROPONINI in the last 72 hours. BNP Recent Labs    01/09/21 1715  BNP 410.8*   D-Dimer No results for input(s): DDIMER in the last 72 hours. Hemoglobin A1C Recent Labs    01/10/21 0556  HGBA1C 8.3*   Fasting Lipid Panel Recent Labs    01/10/21 0556  CHOL 145  HDL 31*  LDLCALC 69  TRIG 225*  CHOLHDL 4.7   Thyroid Function Tests No results for input(s): TSH, T4TOTAL, T3FREE, THYROIDAB in the last 72 hours.  Invalid input(s): FREET3  Telemetry    Normal sinus rhythm  ECG    Sinus rhythm with no ischemia  Radiology    DG Chest 2 View  Result Date: 01/09/2021 CLINICAL DATA:  80 year old female with shortness of breath. EXAM: CHEST - 2 VIEW COMPARISON:  Radiographs 09/20/2020 and earlier. FINDINGS: Calcified aortic atherosclerosis. Cardiac size is at the upper limits of normal. Other mediastinal contours are within normal limits. Visualized tracheal air column is within normal limits. Chronic but diffusely increased pulmonary interstitium when compared to 09/20/2020 PA and lateral study. No pneumothorax, layering pleural effusion or consolidation. There does appear to be trace pleural fluid in the fissures now. Osteopenia. No acute osseous abnormality identified. Negative visible bowel gas pattern. IMPRESSION: 1. Acute on chronic interstitial opacity with suspected trace fluid in the fissures. Favor acute interstitial edema over viral/atypical respiratory infection. 2.  Aortic Atherosclerosis (ICD10-I70.0). Electronically Signed   By: Genevie Ann M.D.   On: 01/09/2021 08:39   NM Pulmonary Perfusion  Result Date: 01/09/2021 CLINICAL DATA:  Chest pressure and shortness of breath today. EXAM: NUCLEAR MEDICINE PERFUSION LUNG SCAN TECHNIQUE: Perfusion images were obtained in multiple  projections after intravenous injection of radiopharmaceutical. Ventilation scans intentionally deferred if perfusion scan and chest x-ray adequate for interpretation during COVID 19 epidemic. RADIOPHARMACEUTICALS:  4.13 mCi Tc-94m MAA IV COMPARISON:  PA and lateral chest today. FINDINGS: Perfusion appears normal without segmental or subsegmental defect. IMPRESSION: Negative for PE. Electronically Signed   By: Inge Rise M.D.   On: 01/09/2021 13:32   DG Chest Portable 1 View  Result Date: 01/09/2021 CLINICAL DATA:  Hypoxemia EXAM: PORTABLE CHEST 1 VIEW COMPARISON:  Portable exam 1645 hours compared to 0816 hours FINDINGS: Minimal enlargement of cardiac silhouette. Atherosclerotic calcification aorta. Diffuse interstitial infiltrates throughout both lungs increased from earlier study of same date, favor pulmonary edema due to rapidity of change and distribution.  Infection is not completely excluded but is felt to be less likely. No pleural effusion or pneumothorax. IMPRESSION: Significant increase in diffuse interstitial infiltrates bilaterally since earlier exam of 01/09/2021 favoring pulmonary edema. Electronically Signed   By: Lavonia Dana M.D.   On: 01/09/2021 16:57   ECHOCARDIOGRAM COMPLETE  Result Date: 01/09/2021    ECHOCARDIOGRAM REPORT   Patient Name:   Brittney [name] Date of Exam: 01/09/2021 Medical Rec #:  [id number]        Height:       66.0 in Accession #:    [id number]       Weight:       187.3 lb Date of Birth:  1941-04-15       BSA:          1.945 m Patient Age:    65 years         BP:           131/98 mmHg Patient Gender: F                HR:           92 bpm. Exam Location:  [facility] Procedure: 2D Echo, Color Doppler, Cardiac Doppler and Intracardiac            Opacification Agent Indications:     I21.4 NSTEMI  History:         Patient has prior history of Echocardiogram examinations, most                  recent 03/19/2020. CKD; Risk Factors:Hypertension, Diabetes and                   Dyslipidemia.  Sonographer:     Charmayne Sheer RDCS (AE) Referring Phys:  [id number] Soledad Gerlach NIU Diagnosing Phys: Bartholome Bill MD IMPRESSIONS  1. Left ventricular ejection fraction, by estimation, is 60 to 65%. The left ventricle has normal function. The left ventricle has no regional wall motion abnormalities. There is mild left ventricular hypertrophy. Left ventricular diastolic parameters are consistent with Grade I diastolic dysfunction (impaired relaxation).  2. Right ventricular systolic function is normal. The right ventricular size is normal.  3. The mitral valve is grossly normal. Mild mitral valve regurgitation.  4. The aortic valve is grossly normal. Aortic valve regurgitation is trivial. FINDINGS  Left Ventricle: Left ventricular ejection fraction, by estimation, is 60 to 65%. The left ventricle has normal function. The left ventricle has no regional wall motion abnormalities. Definity contrast agent was given IV to delineate the left ventricular  endocardial borders. The left ventricular internal cavity size was normal in size. There is mild left ventricular hypertrophy. Left ventricular diastolic parameters are consistent with Grade I diastolic dysfunction (impaired relaxation). Right Ventricle: The right ventricular size is normal. No increase in right ventricular wall thickness. Right ventricular systolic function is normal. Left Atrium: Left atrial size was normal in size. Right Atrium: Right atrial size was normal in size. Pericardium: There is no evidence of pericardial effusion. Mitral Valve: The mitral valve is grossly normal. Mild mitral valve regurgitation. MV peak gradient, 10.5 mmHg. The mean mitral valve gradient is 4.0 mmHg. Tricuspid Valve: The tricuspid valve is grossly normal. Tricuspid valve regurgitation is trivial. Aortic Valve: The aortic valve is grossly normal. Aortic valve regurgitation is trivial. Aortic valve mean gradient measures 4.0 mmHg. Aortic valve peak gradient measures 6.9 mmHg.  Aortic valve area, by VTI measures 2.57 cm. Pulmonic Valve: The pulmonic valve was not well visualized.  Pulmonic valve regurgitation is trivial. Aorta: The aortic root is normal in size and structure. IAS/Shunts: The interatrial septum was not well visualized.  LEFT VENTRICLE PLAX 2D LVIDd:         4.93 cm     Diastology LVIDs:         3.69 cm     LV e' medial:    4.68 cm/s LV PW:         1.00 cm     LV E/e' medial:  25.6 LV IVS:        0.78 cm     LV e' lateral:   9.14 cm/s LVOT diam:     2.10 cm     LV E/e' lateral: 13.1 LV SV:         63 LV SV Index:   33 LVOT Area:     3.46 cm  LV Volumes (MOD) LV vol d, MOD A2C: 94.0 ml LV vol d, MOD A4C: 71.6 ml LV vol s, MOD A2C: 41.5 ml LV vol s, MOD A4C: 17.2 ml LV SV MOD A2C:     52.5 ml LV SV MOD A4C:     71.6 ml LV SV MOD BP:      58.0 ml RIGHT VENTRICLE RV Basal diam:  2.23 cm LEFT ATRIUM             Index       RIGHT ATRIUM          Index LA diam:        3.50 cm 1.80 cm/m  RA Area:     5.88 cm LA Vol (A2C):   43.8 ml 22.52 ml/m RA Volume:   7.50 ml  3.86 ml/m LA Vol (A4C):   47.7 ml 24.53 ml/m LA Biplane Vol: 46.2 ml 23.76 ml/m  AORTIC VALVE                   PULMONIC VALVE AV Area (Vmax):    2.42 cm    PV Vmax:       1.09 m/s AV Area (Vmean):   2.56 cm    PV Vmean:      69.600 cm/s AV Area (VTI):     2.57 cm    PV VTI:        0.175 m AV Vmax:           131.00 cm/s PV Peak grad:  4.8 mmHg AV Vmean:          87.800 cm/s PV Mean grad:  2.0 mmHg AV VTI:            0.247 m AV Peak Grad:      6.9 mmHg AV Mean Grad:      4.0 mmHg LVOT Vmax:         91.70 cm/s LVOT Vmean:        64.800 cm/s LVOT VTI:          0.183 m LVOT/AV VTI ratio: 0.74  AORTA Ao Root diam: 3.00 cm MITRAL VALVE MV Area (PHT): 5.27 cm     SHUNTS MV Area VTI:   2.28 cm     Systemic VTI:  0.18 m MV Peak grad:  10.5 mmHg    Systemic Diam: 2.10 cm MV Mean grad:  4.0 mmHg MV Vmax:       1.62 m/s MV Vmean:      97.9 cm/s MV Decel Time: 144 msec MV E velocity: 120.00 cm/s MV A velocity: 154.00 cm/s  MV E/A ratio:  0.78 Bartholome Bill MD Electronically signed by Bartholome Bill MD Signature Date/Time: 01/09/2021/3:36:24 PM    Final     Assessment & Plan    1. Chest pain/elevated troponin  -186, 1020 respectively;13,582. -Patient is currently chest pain free; due to AKI, will defer invasive workup with a LHC at this time; if renal function improves, may consider further workup early next week pending clinical statushowever currently, patient appears stable and would defer contrast study at present. -Echocardiogramshowed preserved LV function with no high-grade valvular abnormalities. -Doing well off heparin.  -Sublingual nitroglycerinwe will attempt to optimize medical management appears to be tolerating increased isosorbide mononitrate to 120 mg daily as well as ranolazine to 1000 mg twice daily.  Blood pressure appears to be tolerating this fairly well.  We will follow for 24 hours and hopefully consider discharge if renal function improves.  No further cardiac studies indicated at present.  2. History of HFpEF  -Chest xray revealing possible edema, BNP slightly elevated but no clinical evidence of HF  3. Acute on chronic renal insufficiency  -Creatinine on admission 4.33 and is improved to 4.01;appreciate nephrology input.  4. Mitral insufficiency  -Mild moderate per recent echo;  Signed, Javier Docker. Zya Finkle MD 01/12/2021, 2:34 PM  Pager: [phone]

## 2021-01-12 NOTE — Care Management Important Message (Signed)
Important Message  Patient Details  Name: MYNDI WAMBLE MRN: [id number] Date of Birth: [birth year]   Medicare Important Message Given:  Yes     Dannette Barbara 01/12/2021, 12:12 PM

## 2021-01-12 NOTE — Progress Notes (Signed)
[facility] [place], [place] 01/12/21  Subjective:   LOS: 3 01/16 0701 - 01/17 0700 In: 240 [P.O.:240] Out: 400 [Urine:400] Renal function worse today with a creatinine of 4.01 and EGFR down to 11. We had a long discussion about the potential for renal placement therapy today. Patient appears open to dialysis if deemed necessary.  Objective:  Vital signs in last 24 hours:  Temp:  [98.4 F (36.9 C)-98.9 F (37.2 C)] 98.9 F (37.2 C) (01/17 1630) Pulse Rate:  [80-90] 80 (01/17 1630) Resp:  [18-20] 20 (01/17 1630) BP: (113-132)/(53-61) 113/59 (01/17 1630) SpO2:  [96 %-100 %] 100 % (01/17 1630) Weight:  [85.2 kg] 85.2 kg (01/16 2100)  Weight change: -0.7 kg Filed Weights   01/09/21 1001 01/10/21 1915 01/11/21 2100  Weight: 85 kg 85.9 kg 85.2 kg    Intake/Output:    Intake/Output Summary (Last 24 hours) at 01/12/2021 1638 Last data filed at 01/12/2021 1631 Gross per 24 hour  Intake 600 ml  Output 1500 ml  Net -900 ml    Physical Exam: General:  Resting in bed, in no acute distress  HEENT  normocephalic, atraumatic, oral mucous membranes moist  Pulm/lungs  clear bilateral, normal effort  CVS/Heart  S1-S2, no rubs or gallops  Abdomen:   Soft, nontender, nondistended  Extremities:  Trace peripheral edema  Neurologic:  Awake, alert, oriented  Skin:  No acute rashes or lesions      Basic Metabolic Panel:  Recent Labs  Lab 01/09/21 0813 01/10/21 0556 01/11/21 0424 01/12/21 0402  NA 136 139 134* 134*  K 4.7 4.4 4.5 5.1  CL 100 104 101 101  CO2 21* 24 23 24  GLUCOSE 297* 219* 250* 214*  BUN 50* 49* 43* 40*  CREATININE 4.33* 4.06* 3.85* 4.01*  CALCIUM 8.9 8.8* 8.7* 8.8*  MG  --   --  2.6* 2.7*  PHOS  --   --  3.0 3.8     CBC: Recent Labs  Lab 01/09/21 0813 01/10/21 1111 01/11/21 0424 01/11/21 1444 01/12/21 0402  WBC 9.5 9.0 12.0*  --  10.9*  HGB 10.1* 8.4* 7.9* 8.6* 7.9*  HCT 32.2* 27.1* 25.0* 26.6* 25.5*  MCV 80.5 79.7* 80.1   --  80.7  PLT 571* 517* 456*  --  475*      Lab Results  Component Value Date   HEPBSAG NON REACTIVE 02/02/2020   HEPBSAB NON REACTIVE 02/02/2020   HEPBIGM NON REACTIVE 02/02/2020      Microbiology:  Recent Results (from the past 240 hour(s))  Resp Panel by RT-PCR (Flu A&B, Covid) Nasopharyngeal Swab     Status: None   Collection Time: 01/09/21  9:55 AM   Specimen: Nasopharyngeal Swab; Nasopharyngeal(NP) swabs in vial transport medium  Result Value Ref Range Status   SARS Coronavirus 2 by RT PCR NEGATIVE NEGATIVE Final    Comment: (NOTE) SARS-CoV-2 target nucleic acids are NOT DETECTED.  The SARS-CoV-2 RNA is generally detectable in upper respiratory specimens during the acute phase of infection. The lowest concentration of SARS-CoV-2 viral copies this assay can detect is 138 copies/mL. A negative result does not preclude SARS-Cov-2 infection and should not be used as the sole basis for treatment or other patient management decisions. A negative result may occur with  improper specimen collection/handling, submission of specimen other than nasopharyngeal swab, presence of viral mutation(s) within the areas targeted by this assay, and inadequate number of viral copies(<138 copies/mL). A negative result must be combined with clinical observations,   patient history, and epidemiological information. The expected result is Negative.  Fact Sheet for Patients:  https://www.fda.gov/media/152166/download  Fact Sheet for Healthcare Providers:  https://www.fda.gov/media/152162/download  This test is no t yet approved or cleared by the [place] FDA and  has been authorized for detection and/or diagnosis of SARS-CoV-2 by FDA under an Emergency Use Authorization (EUA). This EUA will remain  in effect (meaning this test can be used) for the duration of the COVID-19 declaration under Section 564(b)(1) of the Act, 21 U.S.C.section 360bbb-3(b)(1), unless the authorization is  terminated  or revoked sooner.       Influenza A by PCR NEGATIVE NEGATIVE Final   Influenza B by PCR NEGATIVE NEGATIVE Final    Comment: (NOTE) The Xpert Xpress SARS-CoV-2/FLU/RSV plus assay is intended as an aid in the diagnosis of influenza from Nasopharyngeal swab specimens and should not be used as a sole basis for treatment. Nasal washings and aspirates are unacceptable for Xpert Xpress SARS-CoV-2/FLU/RSV testing.  Fact Sheet for Patients: https://www.fda.gov/media/152166/download  Fact Sheet for Healthcare Providers: https://www.fda.gov/media/152162/download  This test is not yet approved or cleared by the [place] FDA and has been authorized for detection and/or diagnosis of SARS-CoV-2 by FDA under an Emergency Use Authorization (EUA). This EUA will remain in effect (meaning this test can be used) for the duration of the COVID-19 declaration under Section 564(b)(1) of the Act, 21 U.S.C. section 360bbb-3(b)(1), unless the authorization is terminated or revoked.  Performed at [facility], [address] [place], [place] [address]     Coagulation Studies: No results for input(s): LABPROT, INR in the last 72 hours.  Urinalysis: No results for input(s): COLORURINE, LABSPEC, PHURINE, GLUCOSEU, HGBUR, BILIRUBINUR, KETONESUR, PROTEINUR, UROBILINOGEN, NITRITE, LEUKOCYTESUR in the last 72 hours.  Invalid input(s): APPERANCEUR    Imaging: No results found.   Medications:    . amLODipine  10 mg Oral Daily  . aspirin EC  81 mg Oral Daily  . atorvastatin  40 mg Oral Daily  . calcitRIOL  0.25 mcg Oral Daily  . hydrALAZINE  25 mg Oral BID  . insulin aspart  0-5 Units Subcutaneous QHS  . insulin aspart  0-9 Units Subcutaneous TID WC  . insulin glargine  12 Units Subcutaneous QHS  . isosorbide mononitrate  120 mg Oral Daily  . loratadine  10 mg Oral Daily  . metoprolol succinate  100 mg Oral Daily  . multivitamin with minerals  1 tablet Oral Daily  .  pantoprazole  40 mg Oral Daily  . ranolazine  1,000 mg Oral BID   acetaminophen, albuterol, hydrALAZINE, morphine injection, nitroGLYCERIN, ondansetron (ZOFRAN) IV  Assessment/ Plan:  79 y.o. female with hypertension, systolic congestive heart failure, hyperlipidemia, diabetes mellitus type II insulin dependent, aortic valve stenosis  Admitted on 01/09/2021 for   Principal Problem:   NSTEMI (non-ST elevated myocardial infarction) (HCC) Active Problems:   Benign essential HTN   Type II diabetes mellitus with renal manifestations (HCC)   Hyperlipidemia   CAD (coronary artery disease)   Anemia in chronic kidney disease   Acute renal failure superimposed on stage 4 chronic kidney disease (HCC)   Chronic diastolic CHF (congestive heart failure) (HCC)   #.  Kidney disease stage V Recent Labs    01/09/21 0813 01/10/21 0556 01/11/21 0424 01/12/21 0402  CREATININE 4.33* 4.06* 3.85* 4.01*  Baseline Creatinine 3.10 / GFR 14-16 on 11/25/2020 Presenting Creatinine 4.33/GFR 10 AKI versus progression of underlying chronic kidney disease  Continues to have significantly diminished renal function.    Creatinine 4.01 with an EGFR of 11.  Urine output was only 400 cc yesterday.  We are considering initiation of hemodialysis treatment.  #. Anemia of CKD Lab Results  Component Value Date   HGB 7.9 (L) 01/12/2021   We will likely need to start the patient on Epogen in the relative near future.  #.  Secondary hyperparathyroidism     Component Value Date/Time   PTH 35 02/02/2020 0553   Lab Results  Component Value Date   PHOS 3.8 01/12/2021  Maintain the patient on calcitriol 0.25 mcg p.o. daily.  #. HTN with CKD Blood pressure readings within low normal range Current antihypertensive regimen includes amlodipine ,hydralazine, isosorbide mononitrate and metoprolol  #. Diabetes type 2 with CKD Hgb A1c MFr Bld (%)  Date Value  01/10/2021 8.3 (H)  Continue amlodipine, hydralazine,  metoprolol, Imdur.   LOS: 3 Keanu Lesniak 1/17/20224:38 PM  [place], [place]

## 2021-01-12 NOTE — Progress Notes (Signed)
Inpatient Diabetes Program Recommendations  AACE/ADA: New Consensus Statement on Inpatient Glycemic Control   Target Ranges:  Prepandial:   less than 140 mg/dL      Peak postprandial:   less than 180 mg/dL (1-2 hours)      Critically ill patients:  140 - 180 mg/dL   Results for Brittney [name], Brittney [name] (MRN [id number]) as of 01/12/2021 08:32  Ref. Range 01/11/2021 07:28 01/11/2021 11:50 01/11/2021 16:28 01/11/2021 21:09 01/12/2021 07:28  Glucose-Capillary Latest Ref Range: 70 - 99 mg/dL 216 (H) 266 (H) 169 (H) 162 (H) 185 (H)   Review of Glycemic Control  Diabetes history: DM2 Outpatient Diabetes medications: Glipizide XL 10 mg BID, Lantus 16 units QHS, Metformin 1000 mg BID, Januvia 100 mg daily Current orders for Inpatient glycemic control: Lantus 12 units QHS, Novolog 0-9 units TID with meals, Novolog 0-5 units QHS  Inpatient Diabetes Program Recommendations:    Insulin: Please consider ordering Novolog 3 units TID with meals for meal coverage if patient eats at least 50% of meals.  Thanks, Barnie Alderman, RN, MSN, CDE Diabetes Coordinator Inpatient Diabetes Program [phone] (Team Pager from 8am to 5pm)

## 2021-01-13 LAB — BASIC METABOLIC PANEL
Anion gap: 11 (ref 5–15)
BUN: 42 mg/dL — ABNORMAL HIGH (ref 8–23)
CO2: 23 mmol/L (ref 22–32)
Calcium: 8.9 mg/dL (ref 8.9–10.3)
Chloride: 98 mmol/L (ref 98–111)
Creatinine, Ser: 3.91 mg/dL — ABNORMAL HIGH (ref 0.44–1.00)
GFR, Estimated: 11 mL/min — ABNORMAL LOW (ref >60)
Glucose, Bld: 236 mg/dL — ABNORMAL HIGH (ref 70–99)
Potassium: 4.9 mmol/L (ref 3.5–5.1)
Sodium: 132 mmol/L — ABNORMAL LOW (ref 135–145)

## 2021-01-13 LAB — GLUCOSE, CAPILLARY
Glucose-Capillary: 218 mg/dL — ABNORMAL HIGH (ref 70–99)
Glucose-Capillary: 182 mg/dL — ABNORMAL HIGH (ref 70–99)
Glucose-Capillary: 231 mg/dL — ABNORMAL HIGH (ref 70–99)
Glucose-Capillary: 319 mg/dL — ABNORMAL HIGH (ref 70–99)

## 2021-01-13 LAB — HEMOGLOBIN AND HEMATOCRIT, BLOOD
HCT: 24.7 % — ABNORMAL LOW (ref 36.0–46.0)
Hemoglobin: 8.1 g/dL — ABNORMAL LOW (ref 12.0–15.0)

## 2021-01-13 MED ORDER — HEPARIN SODIUM (PORCINE) 5000 UNIT/ML IJ SOLN
5000.0000 [IU] | Freq: Three times a day (TID) | INTRAMUSCULAR | Status: DC
  Administered 2021-01-13 – 2021-01-16 (×8): 5000 [IU] via SUBCUTANEOUS
  Filled 2021-01-13 (×8): qty 1

## 2021-01-13 MED ORDER — EPOETIN ALFA 40000 UNIT/ML IJ SOLN
20000.0000 [IU] | INTRAMUSCULAR | Status: DC
  Filled 2021-01-13: qty 1

## 2021-01-13 NOTE — Progress Notes (Signed)
PROGRESS NOTE    Brittney [name]  [id number] DOB: Jan 05, 1941 DOA: 01/09/2021 PCP: Elisabeth Cara, NP   Brief Narrative:  This 80 years old female with PMH significant for hypertension, hyperlipidemia, DM, GERD, CKD stage IV, diastolic CHF, aortic valve stenosis, CAD presents with chest pain associated with shortness of breath.  Patient describes having chest pain since morning associated with shortness of breath,  pain radiating towards left side of chest.  She is found to have elevated troponins. VQ scan negative for PE.  She is admitted for non-STEMI, started on heparin.  Cardiology was consulted,  recommended there is no plan for any invasive intervention at this time since patient's chest pain has resolved, and worsening renal functions.  Nephrology was consulted , there is high likelihood that she will need hemodialysis during this hospitalization.  Assessment & Plan:   Principal Problem:   NSTEMI (non-ST elevated myocardial infarction) ([place]) Active Problems:   Benign essential HTN   Type II diabetes mellitus with renal manifestations (HCC)   Hyperlipidemia   CAD (coronary artery disease)   Anemia in chronic kidney disease   Acute renal failure superimposed on stage 4 chronic kidney disease (HCC)   Chronic diastolic CHF (congestive heart failure) (HCC)   NSTEMI (non-ST elevated myocardial infarction) and hx of CAD: trop 186 -->1020.  Patient presented with chest pain associated with shortness of breath.  Found to have elevated troponins. Cardiology was consulted.  Patient was started on heparin GTT. Due to worsening renal functions, cannot do cardiac cath at this time. VQ scan negative for PE. Patient's chest pain has resolved. Cardiology recommended medical management at this time. admitted to progressive unit as inpatient Discontinued heparin after 48 hours. Continue prn Nitroglycerin, Morphine, and aspirin, lipitor, Imdur, metoprolol, Ranexa Risk factor stratification:  will check FLP and A1C  2d echo: Normal LVEF 60 to [id number] diastolic dysfunction stage I. No plan for invasive intervention at this time unless renal functions improved.  Benign essential HTN Continue metoprolol, amlodipine, hydralazine Hold Cozaar due to worsening renal function Continue IV hydralazine as needed  Type II diabetes mellitus with renal manifestations [facility]):  Recent A1c 10.5, poorly controlled.  Patient is taking metformin, Jardiance, glipizide and Lantus. Hold p.o. home diabetic medications. Continue Lantus dose from 12 units daily Sliding scale insulin  Hyperlipidemia Continue Lipitor  Anemia in chronic kidney disease: Hgb stable, 10.1 Hemoglobin dropped from 8.4-7.9,  monitor H&H Transfuse PRBC if hemoglobin below 7.  Acute renal failure superimposed on stage 4 chronic kidney disease ([place]):  Baseline creatinine 2.83 on 10/14/2020.  Her creatinine on admission 4.33, BUN 50. Nephrology was consulted recommended there is no need for urgent hemodialysis at this time.  Continue to monitor renal functions.  Serum creatinine trending up. Explained to the patient she may require hemodialysis during this admission. -Hold torsemide, Cozaar, Similac total. Patient renal functions worsening,  Nephrology has recommended initiation of hemodialysis.  Chronic diastolic CHF (congestive heart failure) ([place]):  2D echo 03/19/2020 showed EF of 60 to 65% with grade 1 diastolic dysfunction.   Patient has trace leg edema, BNP 176, chest x-ray showed interstitial edema.   Given 1 dose of Lasix 40 mg. Lasix on hold. -Continue BiPAP prn  DVT prophylaxis: Heparin sq Code Status: Full code Family Communication: No family at bedside Disposition Plan:  Status is: Inpatient  Remains inpatient appropriate because:Inpatient level of care appropriate due to severity of illness   Dispo: The patient is from: Home  Anticipated d/c is to: Home              Anticipated d/c date  is: > 3 days              Patient currently is not medically stable to d/c.  Consultants:   Cardiology  Nephrology  Procedures:  Antimicrobials :  Anti-infectives (From admission, onward)   None     Subjective: Patient was seen and examined at bedside.  Overnight events noted.  She reports feeling better, denies any chest pain or shortness of breath. Patient was having breakfast, denies any difficulty swallowing.  Objective: Vitals:   01/13/21 0445 01/13/21 0740 01/13/21 1151 01/13/21 1512  BP: 133/60 139/69 124/68 122/71  Pulse: 84 81 79 76  Resp: (!) '23 20 20 20  '$ Temp:  98.5 F (36.9 C) 98.3 F (36.8 C) 98.2 F (36.8 C)  TempSrc:  Oral Oral Oral  SpO2: 100% 100% 100% 100%  Weight:      Height:        Intake/Output Summary (Last 24 hours) at 01/13/2021 1528 Last data filed at 01/13/2021 1518 Gross per 24 hour  Intake 480 ml  Output 1975 ml  Net -1495 ml   Filed Weights   01/10/21 1915 01/11/21 2100 01/13/21 0045  Weight: 85.9 kg 85.2 kg 82.6 kg    Examination:  General exam: Appears calm and comfortable, not in any acute distress Respiratory system: Clear to auscultation. Respiratory effort normal. Cardiovascular system: S1 & S2 heard, RRR. No JVD, murmurs, rubs, gallops or clicks. No pedal edema. Gastrointestinal system: Abdomen is nondistended, soft and nontender. No organomegaly or masses felt. Normal bowel sounds heard. Central nervous system: Alert and oriented. No focal neurological deficits. Extremities: Symmetric 5 x 5 power. Skin: No rashes, lesions or ulcers Psychiatry: Judgement and insight appear normal. Mood & affect appropriate.     Data Reviewed: I have personally reviewed following labs and imaging studies  CBC: Recent Labs  Lab 01/09/21 0813 01/10/21 1111 01/11/21 0424 01/11/21 1444 01/12/21 0402 01/13/21 0454  WBC 9.5 9.0 12.0*  --  10.9*  --   HGB 10.1* 8.4* 7.9* 8.6* 7.9* 8.1*  HCT 32.2* 27.1* 25.0* 26.6* 25.5* 24.7*  MCV  80.5 79.7* 80.1  --  80.7  --   PLT 571* 517* 456*  --  475*  --    Basic Metabolic Panel: Recent Labs  Lab 01/09/21 0813 01/10/21 0556 01/11/21 0424 01/12/21 0402 01/13/21 0454  NA 136 139 134* 134* 132*  K 4.7 4.4 4.5 5.1 4.9  CL 100 104 101 101 98  CO2 21* '24 23 24 23  '$ GLUCOSE 297* 219* 250* 214* 236*  BUN 50* 49* 43* 40* 42*  CREATININE 4.33* 4.06* 3.85* 4.01* 3.91*  CALCIUM 8.9 8.8* 8.7* 8.8* 8.9  MG  --   --  2.6* 2.7*  --   PHOS  --   --  3.0 3.8  --    GFR: Estimated Creatinine Clearance: 12.7 mL/min (A) (by C-G formula based on SCr of 3.91 mg/dL (H)). Liver Function Tests: Recent Labs  Lab 01/09/21 1715  AST 55*  ALT 20  ALKPHOS 31*  BILITOT 0.6  PROT 7.2  ALBUMIN 3.3*   No results for input(s): LIPASE, AMYLASE in the last 168 hours. No results for input(s): AMMONIA in the last 168 hours. Coagulation Profile: Recent Labs  Lab 01/09/21 1113  INR 1.0   Cardiac Enzymes: No results for input(s): CKTOTAL, CKMB, CKMBINDEX, TROPONINI in the last  168 hours. BNP (last 3 results) No results for input(s): PROBNP in the last 8760 hours. HbA1C: No results for input(s): HGBA1C in the last 72 hours. CBG: Recent Labs  Lab 01/12/21 1134 01/12/21 1629 01/12/21 2050 01/13/21 0740 01/13/21 1151  GLUCAP 256* 221* 281* 218* 182*   Lipid Profile: No results for input(s): CHOL, HDL, LDLCALC, TRIG, CHOLHDL, LDLDIRECT in the last 72 hours. Thyroid Function Tests: No results for input(s): TSH, T4TOTAL, FREET4, T3FREE, THYROIDAB in the last 72 hours. Anemia Panel: No results for input(s): VITAMINB12, FOLATE, FERRITIN, TIBC, IRON, RETICCTPCT in the last 72 hours. Sepsis Labs: No results for input(s): PROCALCITON, LATICACIDVEN in the last 168 hours.  Recent Results (from the past 240 hour(s))  Resp Panel by RT-PCR (Flu A&B, Covid) Nasopharyngeal Swab     Status: None   Collection Time: 01/09/21  9:55 AM   Specimen: Nasopharyngeal Swab; Nasopharyngeal(NP) swabs in  vial transport medium  Result Value Ref Range Status   SARS Coronavirus 2 by RT PCR NEGATIVE NEGATIVE Final    Comment: (NOTE) SARS-CoV-2 target nucleic acids are NOT DETECTED.  The SARS-CoV-2 RNA is generally detectable in upper respiratory specimens during the acute phase of infection. The lowest concentration of SARS-CoV-2 viral copies this assay can detect is 138 copies/mL. A negative result does not preclude SARS-Cov-2 infection and should not be used as the sole basis for treatment or other patient management decisions. A negative result may occur with  improper specimen collection/handling, submission of specimen other than nasopharyngeal swab, presence of viral mutation(s) within the areas targeted by this assay, and inadequate number of viral copies(<138 copies/mL). A negative result must be combined with clinical observations, patient history, and epidemiological information. The expected result is Negative.  Fact Sheet for Patients:  EntrepreneurPulse.com.au  Fact Sheet for Healthcare Providers:  IncredibleEmployment.be  This test is no t yet approved or cleared by the [place] FDA and  has been authorized for detection and/or diagnosis of SARS-CoV-2 by FDA under an Emergency Use Authorization (EUA). This EUA will remain  in effect (meaning this test can be used) for the duration of the COVID-19 declaration under Section 564(b)(1) of the Act, 21 U.S.C.section 360bbb-3(b)(1), unless the authorization is terminated  or revoked sooner.       Influenza A by PCR NEGATIVE NEGATIVE Final   Influenza B by PCR NEGATIVE NEGATIVE Final    Comment: (NOTE) The Xpert Xpress SARS-CoV-2/FLU/RSV plus assay is intended as an aid in the diagnosis of influenza from Nasopharyngeal swab specimens and should not be used as a sole basis for treatment. Nasal washings and aspirates are unacceptable for Xpert Xpress SARS-CoV-2/FLU/RSV testing.  Fact  Sheet for Patients: EntrepreneurPulse.com.au  Fact Sheet for Healthcare Providers: IncredibleEmployment.be  This test is not yet approved or cleared by the [place] FDA and has been authorized for detection and/or diagnosis of SARS-CoV-2 by FDA under an Emergency Use Authorization (EUA). This EUA will remain in effect (meaning this test can be used) for the duration of the COVID-19 declaration under Section 564(b)(1) of the Act, 21 U.S.C. section 360bbb-3(b)(1), unless the authorization is terminated or revoked.  Performed at [facility], [address], [place], [place] [address]      Radiology Studies: No results found.  Scheduled Meds: . amLODipine  10 mg Oral Daily  . aspirin EC  81 mg Oral Daily  . atorvastatin  40 mg Oral Daily  . calcitRIOL  0.25 mcg Oral Daily  . heparin injection (subcutaneous)  5,000  Units Subcutaneous Q8H  . hydrALAZINE  25 mg Oral BID  . insulin aspart  0-5 Units Subcutaneous QHS  . insulin aspart  0-9 Units Subcutaneous TID WC  . insulin glargine  12 Units Subcutaneous QHS  . isosorbide mononitrate  120 mg Oral Daily  . loratadine  10 mg Oral Daily  . metoprolol succinate  100 mg Oral Daily  . multivitamin with minerals  1 tablet Oral Daily  . pantoprazole  40 mg Oral Daily  . ranolazine  1,000 mg Oral BID   Continuous Infusions:    LOS: 4 days    Time spent: 25 mins    Shawna Clamp, MD [facility]   If 7PM-7AM, please contact night-coverage

## 2021-01-13 NOTE — Progress Notes (Signed)
Patient Name: Brittney [name] Date of Encounter: 01/13/2021  Hospital Problem List     Principal Problem:   NSTEMI (non-ST elevated myocardial infarction) [facility]) Active Problems:   Benign essential HTN   Type II diabetes mellitus with renal manifestations ([place])   Hyperlipidemia   CAD (coronary artery disease)   Anemia in chronic kidney disease   Acute renal failure superimposed on stage 4 chronic kidney disease (HCC)   Chronic diastolic CHF (congestive heart failure) [facility])    Patient Profile      80 year old female with a past medical history significant for coronary artery disease, HFpEF, aortic valve stenosis, type 2 diabetes, chronic kidney disease, hyperlipidemia, and hypertension who presented to the ED on 01/09/21 for a one day history of chest pain with associated shortness of breath. Workup in the ED has been significant for ECG revealing sinus tachycardia, rate of 111bpm, with nonspecific ST abnormalities, chest xray revealing suspected acute interstitial edema, high sensitivity troponin 186 and 1020 respectively, BNP of 176, COVID-19 negative, creatinine of 4.33, and a GFR of 10. Left heart catheterization on 01/23/19 revealed moderate to severe disease in the proximal LAD, 70% stenosed, as well as 70% stenosis in the distal LAD. RCA had a 99% mid to distal lesion with calcification  Subjective   No chest pain.  Mild shortness of breath.  Inpatient Medications    . amLODipine  10 mg Oral Daily  . aspirin EC  81 mg Oral Daily  . atorvastatin  40 mg Oral Daily  . calcitRIOL  0.25 mcg Oral Daily  . hydrALAZINE  25 mg Oral BID  . insulin aspart  0-5 Units Subcutaneous QHS  . insulin aspart  0-9 Units Subcutaneous TID WC  . insulin glargine  12 Units Subcutaneous QHS  . isosorbide mononitrate  120 mg Oral Daily  . loratadine  10 mg Oral Daily  . metoprolol succinate  100 mg Oral Daily  . multivitamin with minerals  1 tablet Oral Daily  . pantoprazole  40 mg Oral Daily   . ranolazine  1,000 mg Oral BID    Vital Signs    Vitals:   01/13/21 0045 01/13/21 0445 01/13/21 0740 01/13/21 1151  BP:  133/60 139/69 124/68  Pulse:  84 81 79  Resp:  (!) '23 20 20  '$ Temp:   98.5 F (36.9 C) 98.3 F (36.8 C)  TempSrc:   Oral Oral  SpO2:  100% 100% 100%  Weight: 82.6 kg     Height:        Intake/Output Summary (Last 24 hours) at 01/13/2021 1301 Last data filed at 01/13/2021 1153 Gross per 24 hour  Intake 480 ml  Output 1300 ml  Net -820 ml   Filed Weights   01/10/21 1915 01/11/21 2100 01/13/21 0045  Weight: 85.9 kg 85.2 kg 82.6 kg    Physical Exam    GEN: Well nourished.  HEENT: normal.  Neck: Supple, no JVD, carotid bruits, or masses. Cardiac: RRR, no murmurs, rubs, or gallops. No clubbing, cyanosis, edema.  Radials/DP/PT 2+ and equal bilaterally.  Respiratory:  Respirations regular and unlabored, clear to auscultation bilaterally. GI: Soft, nontender, nondistended, BS + x 4. MS: no deformity or atrophy. Skin: warm and dry, no rash. Neuro:  Strength and sensation are intact. Psych: Normal affect.  Labs    CBC Recent Labs    01/11/21 0424 01/11/21 1444 01/12/21 0402 01/13/21 0454  WBC 12.0*  --  10.9*  --   HGB 7.9*   < >  7.9* 8.1*  HCT 25.0*   < > 25.5* 24.7*  MCV 80.1  --  80.7  --   PLT 456*  --  475*  --    < > = values in this interval not displayed.   Basic Metabolic Panel Recent Labs    01/11/21 0424 01/12/21 0402 01/13/21 0454  NA 134* 134* 132*  K 4.5 5.1 4.9  CL 101 101 98  CO2 '23 24 23  '$ GLUCOSE 250* 214* 236*  BUN 43* 40* 42*  CREATININE 3.85* 4.01* 3.91*  CALCIUM 8.7* 8.8* 8.9  MG 2.6* 2.7*  --   PHOS 3.0 3.8  --    Liver Function Tests No results for input(s): AST, ALT, ALKPHOS, BILITOT, PROT, ALBUMIN in the last 72 hours. No results for input(s): LIPASE, AMYLASE in the last 72 hours. Cardiac Enzymes No results for input(s): CKTOTAL, CKMB, CKMBINDEX, TROPONINI in the last 72 hours. BNP No results for  input(s): BNP in the last 72 hours. D-Dimer No results for input(s): DDIMER in the last 72 hours. Hemoglobin A1C No results for input(s): HGBA1C in the last 72 hours. Fasting Lipid Panel No results for input(s): CHOL, HDL, LDLCALC, TRIG, CHOLHDL, LDLDIRECT in the last 72 hours. Thyroid Function Tests No results for input(s): TSH, T4TOTAL, T3FREE, THYROIDAB in the last 72 hours.  Invalid input(s): FREET3  Telemetry    Normal sinus rhythm  ECG       Radiology    DG Chest 2 View  Result Date: 01/09/2021 CLINICAL DATA:  80 year old female with shortness of breath. EXAM: CHEST - 2 VIEW COMPARISON:  Radiographs 09/20/2020 and earlier. FINDINGS: Calcified aortic atherosclerosis. Cardiac size is at the upper limits of normal. Other mediastinal contours are within normal limits. Visualized tracheal air column is within normal limits. Chronic but diffusely increased pulmonary interstitium when compared to 09/20/2020 PA and lateral study. No pneumothorax, layering pleural effusion or consolidation. There does appear to be trace pleural fluid in the fissures now. Osteopenia. No acute osseous abnormality identified. Negative visible bowel gas pattern. IMPRESSION: 1. Acute on chronic interstitial opacity with suspected trace fluid in the fissures. Favor acute interstitial edema over viral/atypical respiratory infection. 2.  Aortic Atherosclerosis (ICD10-I70.0). Electronically Signed   By: Genevie Ann M.D.   On: 01/09/2021 08:39   NM Pulmonary Perfusion  Result Date: 01/09/2021 CLINICAL DATA:  Chest pressure and shortness of breath today. EXAM: NUCLEAR MEDICINE PERFUSION LUNG SCAN TECHNIQUE: Perfusion images were obtained in multiple projections after intravenous injection of radiopharmaceutical. Ventilation scans intentionally deferred if perfusion scan and chest x-ray adequate for interpretation during COVID 19 epidemic. RADIOPHARMACEUTICALS:  4.13 mCi Tc-11mMAA IV COMPARISON:  PA and lateral chest  today. FINDINGS: Perfusion appears normal without segmental or subsegmental defect. IMPRESSION: Negative for PE. Electronically Signed   By: TInge RiseM.D.   On: 01/09/2021 13:32   DG Chest Portable 1 View  Result Date: 01/09/2021 CLINICAL DATA:  Hypoxemia EXAM: PORTABLE CHEST 1 VIEW COMPARISON:  Portable exam 1645 hours compared to 0816 hours FINDINGS: Minimal enlargement of cardiac silhouette. Atherosclerotic calcification aorta. Diffuse interstitial infiltrates throughout both lungs increased from earlier study of same date, favor pulmonary edema due to rapidity of change and distribution. Infection is not completely excluded but is felt to be less likely. No pleural effusion or pneumothorax. IMPRESSION: Significant increase in diffuse interstitial infiltrates bilaterally since earlier exam of 01/09/2021 favoring pulmonary edema. Electronically Signed   By: MLavonia DanaM.D.   On: 01/09/2021 16:57  ECHOCARDIOGRAM COMPLETE  Result Date: 01/09/2021    ECHOCARDIOGRAM REPORT   Patient Name:   TAMIRAH JANET Date of Exam: 01/09/2021 Medical Rec #:  [id number]        Height:       66.0 in Accession #:    [id number]       Weight:       187.3 lb Date of Birth:  13-Jan-1941       BSA:          1.945 m Patient Age:    5 years         BP:           131/98 mmHg Patient Gender: F                HR:           92 bpm. Exam Location:  [facility] Procedure: 2D Echo, Color Doppler, Cardiac Doppler and Intracardiac            Opacification Agent Indications:     I21.4 NSTEMI  History:         Patient has prior history of Echocardiogram examinations, most                  recent 03/19/2020. CKD; Risk Factors:Hypertension, Diabetes and                  Dyslipidemia.  Sonographer:     Charmayne Sheer RDCS (AE) Referring Phys:  [id number] Soledad Gerlach NIU Diagnosing Phys: Bartholome Bill MD IMPRESSIONS  1. Left ventricular ejection fraction, by estimation, is 60 to 65%. The left ventricle has normal function. The left ventricle has no regional  wall motion abnormalities. There is mild left ventricular hypertrophy. Left ventricular diastolic parameters are consistent with Grade I diastolic dysfunction (impaired relaxation).  2. Right ventricular systolic function is normal. The right ventricular size is normal.  3. The mitral valve is grossly normal. Mild mitral valve regurgitation.  4. The aortic valve is grossly normal. Aortic valve regurgitation is trivial. FINDINGS  Left Ventricle: Left ventricular ejection fraction, by estimation, is 60 to 65%. The left ventricle has normal function. The left ventricle has no regional wall motion abnormalities. Definity contrast agent was given IV to delineate the left ventricular  endocardial borders. The left ventricular internal cavity size was normal in size. There is mild left ventricular hypertrophy. Left ventricular diastolic parameters are consistent with Grade I diastolic dysfunction (impaired relaxation). Right Ventricle: The right ventricular size is normal. No increase in right ventricular wall thickness. Right ventricular systolic function is normal. Left Atrium: Left atrial size was normal in size. Right Atrium: Right atrial size was normal in size. Pericardium: There is no evidence of pericardial effusion. Mitral Valve: The mitral valve is grossly normal. Mild mitral valve regurgitation. MV peak gradient, 10.5 mmHg. The mean mitral valve gradient is 4.0 mmHg. Tricuspid Valve: The tricuspid valve is grossly normal. Tricuspid valve regurgitation is trivial. Aortic Valve: The aortic valve is grossly normal. Aortic valve regurgitation is trivial. Aortic valve mean gradient measures 4.0 mmHg. Aortic valve peak gradient measures 6.9 mmHg. Aortic valve area, by VTI measures 2.57 cm. Pulmonic Valve: The pulmonic valve was not well visualized. Pulmonic valve regurgitation is trivial. Aorta: The aortic root is normal in size and structure. IAS/Shunts: The interatrial septum was not well visualized.  LEFT  VENTRICLE PLAX 2D LVIDd:         4.93 cm     Diastology LVIDs:  3.69 cm     LV e' medial:    4.68 cm/s LV PW:         1.00 cm     LV E/e' medial:  25.6 LV IVS:        0.78 cm     LV e' lateral:   9.14 cm/s LVOT diam:     2.10 cm     LV E/e' lateral: 13.1 LV SV:         63 LV SV Index:   33 LVOT Area:     3.46 cm  LV Volumes (MOD) LV vol d, MOD A2C: 94.0 ml LV vol d, MOD A4C: 71.6 ml LV vol s, MOD A2C: 41.5 ml LV vol s, MOD A4C: 17.2 ml LV SV MOD A2C:     52.5 ml LV SV MOD A4C:     71.6 ml LV SV MOD BP:      58.0 ml RIGHT VENTRICLE RV Basal diam:  2.23 cm LEFT ATRIUM             Index       RIGHT ATRIUM          Index LA diam:        3.50 cm 1.80 cm/m  RA Area:     5.88 cm LA Vol (A2C):   43.8 ml 22.52 ml/m RA Volume:   7.50 ml  3.86 ml/m LA Vol (A4C):   47.7 ml 24.53 ml/m LA Biplane Vol: 46.2 ml 23.76 ml/m  AORTIC VALVE                   PULMONIC VALVE AV Area (Vmax):    2.42 cm    PV Vmax:       1.09 m/s AV Area (Vmean):   2.56 cm    PV Vmean:      69.600 cm/s AV Area (VTI):     2.57 cm    PV VTI:        0.175 m AV Vmax:           131.00 cm/s PV Peak grad:  4.8 mmHg AV Vmean:          87.800 cm/s PV Mean grad:  2.0 mmHg AV VTI:            0.247 m AV Peak Grad:      6.9 mmHg AV Mean Grad:      4.0 mmHg LVOT Vmax:         91.70 cm/s LVOT Vmean:        64.800 cm/s LVOT VTI:          0.183 m LVOT/AV VTI ratio: 0.74  AORTA Ao Root diam: 3.00 cm MITRAL VALVE MV Area (PHT): 5.27 cm     SHUNTS MV Area VTI:   2.28 cm     Systemic VTI:  0.18 m MV Peak grad:  10.5 mmHg    Systemic Diam: 2.10 cm MV Mean grad:  4.0 mmHg MV Vmax:       1.62 m/s MV Vmean:      97.9 cm/s MV Decel Time: 144 msec MV E velocity: 120.00 cm/s MV A velocity: 154.00 cm/s MV E/A ratio:  0.78 Bartholome Bill MD Electronically signed by Bartholome Bill MD Signature Date/Time: 01/09/2021/3:36:24 PM    Final     Assessment & Plan      1. Chest pain/elevated troponin  -186, 1020 respectively;13,582. -Patient is  currently chest pain free; due to AKI, will defer  invasive workup with a LHC at this time; if renal function improves, may consider further workup early next week pending clinical statushowever currently, patient appears stable and would defer contrast study at present. -Echocardiogramshowed preserved LV function with no high-grade valvular abnormalities. -Doing well off heparin.             -Sublingual nitroglycerinwe will attempt to optimize medical management appears to be tolerating increased isosorbide mononitrate to 120 mg daily as well as ranolazine to 1000 mg twice daily.  Blood pressure appears to be tolerating this fairly well.  We will follow for further ischemia.  Not a candidate for invasive or contrast study from a cardiac standpoint at present.  No further cardiac work-up indicated at present.  We will continue with the aforementioned medications.  2. History of HFpEF  -Chest xray revealing possible edema, BNP slightly elevated but no clinical evidence of HF  3. Acute on chronic renal insufficiency  -Creatinine 4.01 at present.;appreciate nephrology input.  4. Mitral insufficiency  -Mild moderate per recent echo;   Signed, Javier Docker. Michael Walrath MD 01/13/2021, 1:01 PM  Pager: [phone]

## 2021-01-13 NOTE — Progress Notes (Signed)
[facility], [place] 01/13/21  Subjective:   LOS: 4 01/17 0701 - 01/18 0700 In: 720 [P.O.:720] Out: 1950 [Urine:1950] Urine output increased to 1.9 L over the preceding 24 hours. Creatinine down slightly.  Objective:  Vital signs in last 24 hours:  Temp:  [98.2 F (36.8 C)-98.5 F (36.9 C)] 98.2 F (36.8 C) (01/18 1512) Pulse Rate:  [76-84] 76 (01/18 1512) Resp:  [20-23] 20 (01/18 1512) BP: (122-139)/(53-71) 122/71 (01/18 1512) SpO2:  [100 %] 100 % (01/18 1512) Weight:  [82.6 kg] 82.6 kg (01/18 0045)  Weight change: -2.645 kg Filed Weights   01/10/21 1915 01/11/21 2100 01/13/21 0045  Weight: 85.9 kg 85.2 kg 82.6 kg    Intake/Output:    Intake/Output Summary (Last 24 hours) at 01/13/2021 1849 Last data filed at 01/13/2021 1518 Gross per 24 hour  Intake 480 ml  Output 1675 ml  Net -1195 ml    Physical Exam: General:  Resting in bed, in no acute distress  HEENT  normocephalic, atraumatic, oral mucous membranes moist  Pulm/lungs  clear bilateral, normal effort  CVS/Heart  S1-S2, no rubs or gallops  Abdomen:   Soft, nontender, nondistended  Extremities:  Trace peripheral edema  Neurologic:  Awake, alert, oriented  Skin:  No acute rashes or lesions      Basic Metabolic Panel:  Recent Labs  Lab 01/09/21 0813 01/10/21 0556 01/11/21 0424 01/12/21 0402 01/13/21 0454  NA 136 139 134* 134* 132*  K 4.7 4.4 4.5 5.1 4.9  CL 100 104 101 101 98  CO2 21* '24 23 24 23  '$ GLUCOSE 297* 219* 250* 214* 236*  BUN 50* 49* 43* 40* 42*  CREATININE 4.33* 4.06* 3.85* 4.01* 3.91*  CALCIUM 8.9 8.8* 8.7* 8.8* 8.9  MG  --   --  2.6* 2.7*  --   PHOS  --   --  3.0 3.8  --      CBC: Recent Labs  Lab 01/09/21 0813 01/10/21 1111 01/11/21 0424 01/11/21 1444 01/12/21 0402 01/13/21 0454  WBC 9.5 9.0 12.0*  --  10.9*  --   HGB 10.1* 8.4* 7.9* 8.6* 7.9* 8.1*  HCT 32.2* 27.1* 25.0* 26.6* 25.5* 24.7*  MCV 80.5 79.7* 80.1  --  80.7  --   PLT 571* 517*  456*  --  475*  --       Lab Results  Component Value Date   HEPBSAG NON REACTIVE 02/02/2020   HEPBSAB NON REACTIVE 02/02/2020   HEPBIGM NON REACTIVE 02/02/2020      Microbiology:  Recent Results (from the past 240 hour(s))  Resp Panel by RT-PCR (Flu A&B, Covid) Nasopharyngeal Swab     Status: None   Collection Time: 01/09/21  9:55 AM   Specimen: Nasopharyngeal Swab; Nasopharyngeal(NP) swabs in vial transport medium  Result Value Ref Range Status   SARS Coronavirus 2 by RT PCR NEGATIVE NEGATIVE Final    Comment: (NOTE) SARS-CoV-2 target nucleic acids are NOT DETECTED.  The SARS-CoV-2 RNA is generally detectable in upper respiratory specimens during the acute phase of infection. The lowest concentration of SARS-CoV-2 viral copies this assay can detect is 138 copies/mL. A negative result does not preclude SARS-Cov-2 infection and should not be used as the sole basis for treatment or other patient management decisions. A negative result may occur with  improper specimen collection/handling, submission of specimen other than nasopharyngeal swab, presence of viral mutation(s) within the areas targeted by this assay, and inadequate number of viral copies(<138 copies/mL). A negative  result must be combined with clinical observations, patient history, and epidemiological information. The expected result is Negative.  Fact Sheet for Patients:  EntrepreneurPulse.com.au  Fact Sheet for Healthcare Providers:  IncredibleEmployment.be  This test is no t yet approved or cleared by the [place] FDA and  has been authorized for detection and/or diagnosis of SARS-CoV-2 by FDA under an Emergency Use Authorization (EUA). This EUA will remain  in effect (meaning this test can be used) for the duration of the COVID-19 declaration under Section 564(b)(1) of the Act, 21 U.S.C.section 360bbb-3(b)(1), unless the authorization is terminated  or revoked  sooner.       Influenza A by PCR NEGATIVE NEGATIVE Final   Influenza B by PCR NEGATIVE NEGATIVE Final    Comment: (NOTE) The Xpert Xpress SARS-CoV-2/FLU/RSV plus assay is intended as an aid in the diagnosis of influenza from Nasopharyngeal swab specimens and should not be used as a sole basis for treatment. Nasal washings and aspirates are unacceptable for Xpert Xpress SARS-CoV-2/FLU/RSV testing.  Fact Sheet for Patients: EntrepreneurPulse.com.au  Fact Sheet for Healthcare Providers: IncredibleEmployment.be  This test is not yet approved or cleared by the [place] FDA and has been authorized for detection and/or diagnosis of SARS-CoV-2 by FDA under an Emergency Use Authorization (EUA). This EUA will remain in effect (meaning this test can be used) for the duration of the COVID-19 declaration under Section 564(b)(1) of the Act, 21 U.S.C. section 360bbb-3(b)(1), unless the authorization is terminated or revoked.  Performed at [facility], [address] [place], [place] [address]     Coagulation Studies: No results for input(s): LABPROT, INR in the last 72 hours.  Urinalysis: No results for input(s): COLORURINE, LABSPEC, PHURINE, GLUCOSEU, HGBUR, BILIRUBINUR, KETONESUR, PROTEINUR, UROBILINOGEN, NITRITE, LEUKOCYTESUR in the last 72 hours.  Invalid input(s): APPERANCEUR    Imaging: No results found.   Medications:    . amLODipine  10 mg Oral Daily  . aspirin EC  81 mg Oral Daily  . atorvastatin  40 mg Oral Daily  . calcitRIOL  0.25 mcg Oral Daily  . heparin injection (subcutaneous)  5,000 Units Subcutaneous Q8H  . hydrALAZINE  25 mg Oral BID  . insulin aspart  0-5 Units Subcutaneous QHS  . insulin aspart  0-9 Units Subcutaneous TID WC  . insulin glargine  12 Units Subcutaneous QHS  . isosorbide mononitrate  120 mg Oral Daily  . loratadine  10 mg Oral Daily  . metoprolol succinate  100 mg Oral Daily  .  multivitamin with minerals  1 tablet Oral Daily  . pantoprazole  40 mg Oral Daily  . ranolazine  1,000 mg Oral BID   acetaminophen, albuterol, hydrALAZINE, morphine injection, nitroGLYCERIN, ondansetron (ZOFRAN) IV  Assessment/ Plan:  80 y.o. female with hypertension, systolic congestive heart failure, hyperlipidemia, diabetes mellitus type II insulin dependent, aortic valve stenosis  Admitted on 01/09/2021 for   Principal Problem:   NSTEMI (non-ST elevated myocardial infarction) [facility]) Active Problems:   Benign essential HTN   Type II diabetes mellitus with renal manifestations (HCC)   Hyperlipidemia   CAD (coronary artery disease)   Anemia in chronic kidney disease   Acute renal failure superimposed on stage 4 chronic kidney disease (HCC)   Chronic diastolic CHF (congestive heart failure) ([place])   #.  Kidney disease stage V Recent Labs    01/10/21 0556 01/11/21 0424 01/12/21 0402 01/13/21 0454  CREATININE 4.06* 3.85* 4.01* 3.91*  Baseline Creatinine 3.10 / GFR 14-16 on 11/25/2020 Presenting Creatinine 4.33/GFR  10 AKI versus progression of underlying chronic kidney disease  Urine output did improve to 1.9 L over the preceding 24 hours. Therefore we will continue to hold off on dialysis at the moment. However high risk for progression to ESRD.  #. Anemia of CKD Lab Results  Component Value Date   HGB 8.1 (L) 01/13/2021   Start the patient on Epogen 20,000 units subcutaneous weekly.  #.  Secondary hyperparathyroidism     Component Value Date/Time   PTH 35 02/02/2020 0553   Lab Results  Component Value Date   PHOS 3.8 01/12/2021  Maintain the patient on calcitriol 0.25 mcg p.o. daily.  #. HTN with CKD Continue amlodipine, hydralazine, Imdur, metoprolol  #. Diabetes type 2 with CKD Hgb A1c MFr Bld (%)  Date Value  01/10/2021 8.3 (H)  Management as per hospitalist   LOS: 4 Brittney [name] 1/18/20226:49 [place],  [place]

## 2021-01-14 LAB — MAGNESIUM: Magnesium: 2.6 mg/dL — ABNORMAL HIGH (ref 1.7–2.4)

## 2021-01-14 LAB — HEMOGLOBIN AND HEMATOCRIT, BLOOD
HCT: 24.6 % — ABNORMAL LOW (ref 36.0–46.0)
Hemoglobin: 8 g/dL — ABNORMAL LOW (ref 12.0–15.0)

## 2021-01-14 LAB — BASIC METABOLIC PANEL
Anion gap: 9 (ref 5–15)
BUN: 40 mg/dL — ABNORMAL HIGH (ref 8–23)
CO2: 25 mmol/L (ref 22–32)
Calcium: 9.1 mg/dL (ref 8.9–10.3)
Chloride: 101 mmol/L (ref 98–111)
Creatinine, Ser: 3.75 mg/dL — ABNORMAL HIGH (ref 0.44–1.00)
GFR, Estimated: 12 mL/min — ABNORMAL LOW (ref >60)
Glucose, Bld: 196 mg/dL — ABNORMAL HIGH (ref 70–99)
Potassium: 5.1 mmol/L (ref 3.5–5.1)
Sodium: 135 mmol/L (ref 135–145)

## 2021-01-14 LAB — GLUCOSE, CAPILLARY
Glucose-Capillary: 207 mg/dL — ABNORMAL HIGH (ref 70–99)
Glucose-Capillary: 280 mg/dL — ABNORMAL HIGH (ref 70–99)
Glucose-Capillary: 216 mg/dL — ABNORMAL HIGH (ref 70–99)
Glucose-Capillary: 356 mg/dL — ABNORMAL HIGH (ref 70–99)

## 2021-01-14 LAB — PHOSPHORUS: Phosphorus: 4.2 mg/dL (ref 2.5–4.6)

## 2021-01-14 MED ORDER — INSULIN GLARGINE 100 UNIT/ML ~~LOC~~ SOLN
17.0000 [IU] | Freq: Every day | SUBCUTANEOUS | Status: DC
  Administered 2021-01-14: 17 [IU] via SUBCUTANEOUS
  Filled 2021-01-14 (×2): qty 0.17

## 2021-01-14 NOTE — Progress Notes (Signed)
PROGRESS NOTE    Brittney [name]  [id number] DOB: 1941-02-13 DOA: 01/09/2021 PCP: Elisabeth Cara, NP    Brief Narrative:  This 80 years old female with PMH significant for hypertension, hyperlipidemia, DM, GERD, CKD stage IV, diastolic CHF, aortic valve stenosis, CAD presents with chest pain associated with shortness of breath.  Patient describes having chest pain since morning associated with shortness of breath,  pain radiating towards left side of chest.  She is found to have elevated troponins. VQ scan negative for PE.  She is admitted for non-STEMI, started on heparin.  Cardiology was consulted,  recommended there is no plan for any invasive intervention at this time since patient's chest pain has resolved, and worsening renal functions.  Nephrology was consulted , there is high likelihood that she will need hemodialysis during this hospitalization.  1/19-no overnight issues  Consultants:   Nephrology, cardiology  Procedures:   Antimicrobials:       Subjective: Has no complaints  Objective: Vitals:   01/13/21 2104 01/14/21 0148 01/14/21 0420 01/14/21 0822  BP: 138/60  (!) 135/55 128/66  Pulse: 80  75 82  Resp:    20  Temp: 98.2 F (36.8 C)  98.8 F (37.1 C) 98 F (36.7 C)  TempSrc: Oral  Oral Oral  SpO2: 100%  100% 99%  Weight:  81.8 kg    Height:        Intake/Output Summary (Last 24 hours) at 01/14/2021 0831 Last data filed at 01/14/2021 0542 Gross per 24 hour  Intake 240 ml  Output 1450 ml  Net -1210 ml   Filed Weights   01/11/21 2100 01/13/21 0045 01/14/21 0148  Weight: 85.2 kg 82.6 kg 81.8 kg    Examination:  General exam: Appears calm and comfortable  Respiratory system: Decreased breath sounds at the bases Cardiovascular system: S1 & S2 heard, RRR. No JVD, murmurs, rubs, gallops or clicks. Gastrointestinal system: Abdomen is nondistended, soft and nontender.  Normal bowel sounds heard. Central nervous system:grossly intact Extremities: no  edema Psychiatry:  Mood & affect appropriate in current setting.     Data Reviewed: I have personally reviewed following labs and imaging studies  CBC: Recent Labs  Lab 01/09/21 0813 01/10/21 1111 01/11/21 0424 01/11/21 1444 01/12/21 0402 01/13/21 0454 01/14/21 0429  WBC 9.5 9.0 12.0*  --  10.9*  --   --   HGB 10.1* 8.4* 7.9* 8.6* 7.9* 8.1* 8.0*  HCT 32.2* 27.1* 25.0* 26.6* 25.5* 24.7* 24.6*  MCV 80.5 79.7* 80.1  --  80.7  --   --   PLT 571* 517* 456*  --  475*  --   --    Basic Metabolic Panel: Recent Labs  Lab 01/10/21 0556 01/11/21 0424 01/12/21 0402 01/13/21 0454 01/14/21 0429  NA 139 134* 134* 132* 135  K 4.4 4.5 5.1 4.9 5.1  CL 104 101 101 98 101  CO2 '24 23 24 23 25  '$ GLUCOSE 219* 250* 214* 236* 196*  BUN 49* 43* 40* 42* 40*  CREATININE 4.06* 3.85* 4.01* 3.91* 3.75*  CALCIUM 8.8* 8.7* 8.8* 8.9 9.1  MG  --  2.6* 2.7*  --  2.6*  PHOS  --  3.0 3.8  --  4.2   GFR: Estimated Creatinine Clearance: 13.2 mL/min (A) (by C-G formula based on SCr of 3.75 mg/dL (H)). Liver Function Tests: Recent Labs  Lab 01/09/21 1715  AST 55*  ALT 20  ALKPHOS 31*  BILITOT 0.6  PROT 7.2  ALBUMIN 3.3*   No  results for input(s): LIPASE, AMYLASE in the last 168 hours. No results for input(s): AMMONIA in the last 168 hours. Coagulation Profile: Recent Labs  Lab 01/09/21 1113  INR 1.0   Cardiac Enzymes: No results for input(s): CKTOTAL, CKMB, CKMBINDEX, TROPONINI in the last 168 hours. BNP (last 3 results) No results for input(s): PROBNP in the last 8760 hours. HbA1C: No results for input(s): HGBA1C in the last 72 hours. CBG: Recent Labs  Lab 01/13/21 0740 01/13/21 1151 01/13/21 1648 01/13/21 2101 01/14/21 0819  GLUCAP 218* 182* 231* 319* 207*   Lipid Profile: No results for input(s): CHOL, HDL, LDLCALC, TRIG, CHOLHDL, LDLDIRECT in the last 72 hours. Thyroid Function Tests: No results for input(s): TSH, T4TOTAL, FREET4, T3FREE, THYROIDAB in the last 72  hours. Anemia Panel: No results for input(s): VITAMINB12, FOLATE, FERRITIN, TIBC, IRON, RETICCTPCT in the last 72 hours. Sepsis Labs: No results for input(s): PROCALCITON, LATICACIDVEN in the last 168 hours.  Recent Results (from the past 240 hour(s))  Resp Panel by RT-PCR (Flu A&B, Covid) Nasopharyngeal Swab     Status: None   Collection Time: 01/09/21  9:55 AM   Specimen: Nasopharyngeal Swab; Nasopharyngeal(NP) swabs in vial transport medium  Result Value Ref Range Status   SARS Coronavirus 2 by RT PCR NEGATIVE NEGATIVE Final    Comment: (NOTE) SARS-CoV-2 target nucleic acids are NOT DETECTED.  The SARS-CoV-2 RNA is generally detectable in upper respiratory specimens during the acute phase of infection. The lowest concentration of SARS-CoV-2 viral copies this assay can detect is 138 copies/mL. A negative result does not preclude SARS-Cov-2 infection and should not be used as the sole basis for treatment or other patient management decisions. A negative result may occur with  improper specimen collection/handling, submission of specimen other than nasopharyngeal swab, presence of viral mutation(s) within the areas targeted by this assay, and inadequate number of viral copies(<138 copies/mL). A negative result must be combined with clinical observations, patient history, and epidemiological information. The expected result is Negative.  Fact Sheet for Patients:  EntrepreneurPulse.com.au  Fact Sheet for Healthcare Providers:  IncredibleEmployment.be  This test is no t yet approved or cleared by the [place] FDA and  has been authorized for detection and/or diagnosis of SARS-CoV-2 by FDA under an Emergency Use Authorization (EUA). This EUA will remain  in effect (meaning this test can be used) for the duration of the COVID-19 declaration under Section 564(b)(1) of the Act, 21 U.S.C.section 360bbb-3(b)(1), unless the authorization is  terminated  or revoked sooner.       Influenza A by PCR NEGATIVE NEGATIVE Final   Influenza B by PCR NEGATIVE NEGATIVE Final    Comment: (NOTE) The Xpert Xpress SARS-CoV-2/FLU/RSV plus assay is intended as an aid in the diagnosis of influenza from Nasopharyngeal swab specimens and should not be used as a sole basis for treatment. Nasal washings and aspirates are unacceptable for Xpert Xpress SARS-CoV-2/FLU/RSV testing.  Fact Sheet for Patients: EntrepreneurPulse.com.au  Fact Sheet for Healthcare Providers: IncredibleEmployment.be  This test is not yet approved or cleared by the [place] FDA and has been authorized for detection and/or diagnosis of SARS-CoV-2 by FDA under an Emergency Use Authorization (EUA). This EUA will remain in effect (meaning this test can be used) for the duration of the COVID-19 declaration under Section 564(b)(1) of the Act, 21 U.S.C. section 360bbb-3(b)(1), unless the authorization is terminated or revoked.  Performed at [facility], [address] [place], [place] [address]  Radiology Studies: No results found.      Scheduled Meds: . amLODipine  10 mg Oral Daily  . aspirin EC  81 mg Oral Daily  . atorvastatin  40 mg Oral Daily  . calcitRIOL  0.25 mcg Oral Daily  . [START ON 01/15/2021] epoetin (EPOGEN/PROCRIT) injection  20,000 Units Subcutaneous Q T,Th,Sa-HD  . heparin injection (subcutaneous)  5,000 Units Subcutaneous Q8H  . hydrALAZINE  25 mg Oral BID  . insulin aspart  0-5 Units Subcutaneous QHS  . insulin aspart  0-9 Units Subcutaneous TID WC  . insulin glargine  12 Units Subcutaneous QHS  . isosorbide mononitrate  120 mg Oral Daily  . loratadine  10 mg Oral Daily  . metoprolol succinate  100 mg Oral Daily  . multivitamin with minerals  1 tablet Oral Daily  . pantoprazole  40 mg Oral Daily  . ranolazine  1,000 mg Oral BID   Continuous Infusions:  Assessment &  Plan:   Principal Problem:   NSTEMI (non-ST elevated myocardial infarction) ([place]) Active Problems:   Benign essential HTN   Type II diabetes mellitus with renal manifestations (HCC)   Hyperlipidemia   CAD (coronary artery disease)   Anemia in chronic kidney disease   Acute renal failure superimposed on stage 4 chronic kidney disease (HCC)   Chronic diastolic CHF (congestive heart failure) (HCC)   NSTEMI (non-ST elevated myocardial infarction)and hx of CAD: trop 186 -->1020.  Started on heparin gtt..>now d/'cd after 48hrs RF modification Echo nml ef , diastolic dysf. Stage I Due to renal fxn worsening, no plans for cath at this time vq scan neg. PE Cards rec. Medical mx Continue Ranexa, Imdur, beta-blockers statin, aspirin Ok to d/c from cardiac stand point  Benign essential HTN Overall stable Continue metoprolol, amlodipine, hydralazine  Cozaar held due to worsening renal function     Type II diabetes mellitus with renal manifestations [facility]): Recent A1c 10.5, poorly controlled. Patient is taking metformin, Jardiance, glipizide and Lantus. Hold p.o. home diabetic medications. 1/19-bg elevated.  Will increase Lantus to 17 Units qd. RISS   Hyperlipidemia Continue statin  Anemia in chronic kidney disease:Hgb stable, 10.1 Hemoglobin dropped from 8.4-7.9,  monitor H&H Transfuse PRBC if hemoglobin below 7.  Acute renal failure superimposed on stage 4 chronic kidney disease ([place]): Baseline creatinine 2.83 on 10/14/2020. Her creatinine on admission 4.33, BUN 50. Nephrology was consulted recommended there is no need for urgent hemodialysis at this time.  1/19- Creatinine down to 3.75 with 1.4 L urine output over the preceding 24 hours.  No immediate need for dialysis with suspect Per nephrology she will needed in the relative near future  Continue Epogen  Continue calcitriol 0.25 MCG daily  Continue holding nephrotoxic medications Follow-up renal  function  Chronic diastolic CHF (congestive heart failure) ([place]):  2D echo 03/19/2020 showed EF of 60 to 65% with grade 1 diastolic dysfunction.  Patient has trace leg edema, BNP 176, chest x-ray showed interstitial edema.  Given 1 dose of Lasix 40 mg. Lasix on hold. -Continue BiPAP prn   DVT prophylaxis: heparin  Code Status:full code Family Communication: none at bedside Disposition Plan:  Status is: Inpatient  Remains inpatient appropriate because:Inpatient level of care appropriate due to severity of illness   Dispo: The patient is from: Home              Anticipated d/c is to: TBD              Anticipated d/c date is: 2 days  Patient currently is not medically stable to d/c.monitoring renal function, PT/OT.            LOS: 5 days   Time spent: 35 min with >50% on coc    Nolberto Hanlon, MD [facility] Pager [phone]  If 7PM-7AM, please contact night-coverage 01/14/2021, 8:31 AM

## 2021-01-14 NOTE — Progress Notes (Signed)
Patient Name: Brittney [name] Date of Encounter: 01/14/2021  Hospital Problem List     Principal Problem:   NSTEMI (non-ST elevated myocardial infarction) [facility]) Active Problems:   Benign essential HTN   Type II diabetes mellitus with renal manifestations (HCC)   Hyperlipidemia   CAD (coronary artery disease)   Anemia in chronic kidney disease   Acute renal failure superimposed on stage 4 chronic kidney disease (HCC)   Chronic diastolic CHF (congestive heart failure) [facility])    Patient Profile      80 year old female with a past medical history significant for coronary artery disease, HFpEF, aortic valve stenosis, type 2 diabetes, chronic kidney disease, hyperlipidemia, and hypertension who presented to the ED on 01/09/21 for a one day history of chest pain with associated shortness of breath. Workup in the ED has been significant for ECG revealing sinus tachycardia, rate of 111bpm, with nonspecific ST abnormalities, chest xray revealing suspected acute interstitial edema, high sensitivity troponin 186 and 1020 respectively, BNP of 176, COVID-19 negative, creatinine of 4.33, and a GFR of 10. Left heart catheterization on 01/23/19 revealed moderate to severe disease in the proximal LAD, 70% stenosed, as well as 70% stenosis in the distal LAD. RCA had a 99% mid to distal lesion with calcification  Subjective   Feels better this am. No chest pain  Inpatient Medications    . amLODipine  10 mg Oral Daily  . aspirin EC  81 mg Oral Daily  . atorvastatin  40 mg Oral Daily  . calcitRIOL  0.25 mcg Oral Daily  . [START ON 01/15/2021] epoetin (EPOGEN/PROCRIT) injection  20,000 Units Subcutaneous Q T,Th,Sa-HD  . heparin injection (subcutaneous)  5,000 Units Subcutaneous Q8H  . hydrALAZINE  25 mg Oral BID  . insulin aspart  0-5 Units Subcutaneous QHS  . insulin aspart  0-9 Units Subcutaneous TID WC  . insulin glargine  12 Units Subcutaneous QHS  . isosorbide mononitrate  120 mg Oral Daily  .  loratadine  10 mg Oral Daily  . metoprolol succinate  100 mg Oral Daily  . multivitamin with minerals  1 tablet Oral Daily  . pantoprazole  40 mg Oral Daily  . ranolazine  1,000 mg Oral BID    Vital Signs    Vitals:   01/13/21 1512 01/13/21 2104 01/14/21 0148 01/14/21 0420  BP: 122/71 138/60  (!) 135/55  Pulse: 76 80  75  Resp: 20     Temp: 98.2 F (36.8 C) 98.2 F (36.8 C)  98.8 F (37.1 C)  TempSrc: Oral Oral  Oral  SpO2: 100% 100%  100%  Weight:   81.8 kg   Height:        Intake/Output Summary (Last 24 hours) at 01/14/2021 0735 Last data filed at 01/14/2021 0542 Gross per 24 hour  Intake 240 ml  Output 1450 ml  Net -1210 ml   Filed Weights   01/11/21 2100 01/13/21 0045 01/14/21 0148  Weight: 85.2 kg 82.6 kg 81.8 kg    Physical Exam    GEN: Well nourished, well developed, in no acute distress.  HEENT: normal.  Neck: Supple, no JVD, carotid bruits, or masses. Cardiac: RRR, no murmurs, rubs, or gallops. No clubbing, cyanosis, edema.  Radials/DP/PT 2+ and equal bilaterally.  Respiratory:  Respirations regular and unlabored, clear to auscultation bilaterally. GI: Soft, nontender, nondistended, BS + x 4. MS: no deformity or atrophy. Skin: warm and dry, no rash. Neuro:  Strength and sensation are intact. Psych: Normal affect.  Labs    CBC Recent Labs    01/12/21 0402 01/13/21 0454 01/14/21 0429  WBC 10.9*  --   --   HGB 7.9* 8.1* 8.0*  HCT 25.5* 24.7* 24.6*  MCV 80.7  --   --   PLT 475*  --   --    Basic Metabolic Panel Recent Labs    01/12/21 0402 01/13/21 0454 01/14/21 0429  NA 134* 132* 135  K 5.1 4.9 5.1  CL 101 98 101  CO2 '24 23 25  '$ GLUCOSE 214* 236* 196*  BUN 40* 42* 40*  CREATININE 4.01* 3.91* 3.75*  CALCIUM 8.8* 8.9 9.1  MG 2.7*  --  2.6*  PHOS 3.8  --  4.2   Liver Function Tests No results for input(s): AST, ALT, ALKPHOS, BILITOT, PROT, ALBUMIN in the last 72 hours. No results for input(s): LIPASE, AMYLASE in the last 72  hours. Cardiac Enzymes No results for input(s): CKTOTAL, CKMB, CKMBINDEX, TROPONINI in the last 72 hours. BNP No results for input(s): BNP in the last 72 hours. D-Dimer No results for input(s): DDIMER in the last 72 hours. Hemoglobin A1C No results for input(s): HGBA1C in the last 72 hours. Fasting Lipid Panel No results for input(s): CHOL, HDL, LDLCALC, TRIG, CHOLHDL, LDLDIRECT in the last 72 hours. Thyroid Function Tests No results for input(s): TSH, T4TOTAL, T3FREE, THYROIDAB in the last 72 hours.  Invalid input(s): FREET3  Telemetry    nsr   ECG    nsr  Radiology    DG Chest 2 View  Result Date: 01/09/2021 CLINICAL DATA:  80 year old female with shortness of breath. EXAM: CHEST - 2 VIEW COMPARISON:  Radiographs 09/20/2020 and earlier. FINDINGS: Calcified aortic atherosclerosis. Cardiac size is at the upper limits of normal. Other mediastinal contours are within normal limits. Visualized tracheal air column is within normal limits. Chronic but diffusely increased pulmonary interstitium when compared to 09/20/2020 PA and lateral study. No pneumothorax, layering pleural effusion or consolidation. There does appear to be trace pleural fluid in the fissures now. Osteopenia. No acute osseous abnormality identified. Negative visible bowel gas pattern. IMPRESSION: 1. Acute on chronic interstitial opacity with suspected trace fluid in the fissures. Favor acute interstitial edema over viral/atypical respiratory infection. 2.  Aortic Atherosclerosis (ICD10-I70.0). Electronically Signed   By: Genevie Ann M.D.   On: 01/09/2021 08:39   NM Pulmonary Perfusion  Result Date: 01/09/2021 CLINICAL DATA:  Chest pressure and shortness of breath today. EXAM: NUCLEAR MEDICINE PERFUSION LUNG SCAN TECHNIQUE: Perfusion images were obtained in multiple projections after intravenous injection of radiopharmaceutical. Ventilation scans intentionally deferred if perfusion scan and chest x-ray adequate for  interpretation during COVID 19 epidemic. RADIOPHARMACEUTICALS:  4.13 mCi Tc-30mMAA IV COMPARISON:  PA and lateral chest today. FINDINGS: Perfusion appears normal without segmental or subsegmental defect. IMPRESSION: Negative for PE. Electronically Signed   By: TInge RiseM.D.   On: 01/09/2021 13:32   DG Chest Portable 1 View  Result Date: 01/09/2021 CLINICAL DATA:  Hypoxemia EXAM: PORTABLE CHEST 1 VIEW COMPARISON:  Portable exam 1645 hours compared to 0816 hours FINDINGS: Minimal enlargement of cardiac silhouette. Atherosclerotic calcification aorta. Diffuse interstitial infiltrates throughout both lungs increased from earlier study of same date, favor pulmonary edema due to rapidity of change and distribution. Infection is not completely excluded but is felt to be less likely. No pleural effusion or pneumothorax. IMPRESSION: Significant increase in diffuse interstitial infiltrates bilaterally since earlier exam of 01/09/2021 favoring pulmonary edema. Electronically Signed   By: MElta Guadeloupe  Thornton Papas M.D.   On: 01/09/2021 16:57   ECHOCARDIOGRAM COMPLETE  Result Date: 01/09/2021    ECHOCARDIOGRAM REPORT   Patient Name:   CAROLLYN SCHULTS Date of Exam: 01/09/2021 Medical Rec #:  [id number]        Height:       66.0 in Accession #:    [id number]       Weight:       187.3 lb Date of Birth:  1941-03-10       BSA:          1.945 m Patient Age:    63 years         BP:           131/98 mmHg Patient Gender: F                HR:           92 bpm. Exam Location:  [facility] Procedure: 2D Echo, Color Doppler, Cardiac Doppler and Intracardiac            Opacification Agent Indications:     I21.4 NSTEMI  History:         Patient has prior history of Echocardiogram examinations, most                  recent 03/19/2020. CKD; Risk Factors:Hypertension, Diabetes and                  Dyslipidemia.  Sonographer:     Charmayne Sheer RDCS (AE) Referring Phys:  [id number] Soledad Gerlach NIU Diagnosing Phys: Bartholome Bill MD IMPRESSIONS  1. Left ventricular  ejection fraction, by estimation, is 60 to 65%. The left ventricle has normal function. The left ventricle has no regional wall motion abnormalities. There is mild left ventricular hypertrophy. Left ventricular diastolic parameters are consistent with Grade I diastolic dysfunction (impaired relaxation).  2. Right ventricular systolic function is normal. The right ventricular size is normal.  3. The mitral valve is grossly normal. Mild mitral valve regurgitation.  4. The aortic valve is grossly normal. Aortic valve regurgitation is trivial. FINDINGS  Left Ventricle: Left ventricular ejection fraction, by estimation, is 60 to 65%. The left ventricle has normal function. The left ventricle has no regional wall motion abnormalities. Definity contrast agent was given IV to delineate the left ventricular  endocardial borders. The left ventricular internal cavity size was normal in size. There is mild left ventricular hypertrophy. Left ventricular diastolic parameters are consistent with Grade I diastolic dysfunction (impaired relaxation). Right Ventricle: The right ventricular size is normal. No increase in right ventricular wall thickness. Right ventricular systolic function is normal. Left Atrium: Left atrial size was normal in size. Right Atrium: Right atrial size was normal in size. Pericardium: There is no evidence of pericardial effusion. Mitral Valve: The mitral valve is grossly normal. Mild mitral valve regurgitation. MV peak gradient, 10.5 mmHg. The mean mitral valve gradient is 4.0 mmHg. Tricuspid Valve: The tricuspid valve is grossly normal. Tricuspid valve regurgitation is trivial. Aortic Valve: The aortic valve is grossly normal. Aortic valve regurgitation is trivial. Aortic valve mean gradient measures 4.0 mmHg. Aortic valve peak gradient measures 6.9 mmHg. Aortic valve area, by VTI measures 2.57 cm. Pulmonic Valve: The pulmonic valve was not well visualized. Pulmonic valve regurgitation is trivial. Aorta:  The aortic root is normal in size and structure. IAS/Shunts: The interatrial septum was not well visualized.  LEFT VENTRICLE PLAX 2D LVIDd:         4.93 cm  Diastology LVIDs:         3.69 cm     LV e' medial:    4.68 cm/s LV PW:         1.00 cm     LV E/e' medial:  25.6 LV IVS:        0.78 cm     LV e' lateral:   9.14 cm/s LVOT diam:     2.10 cm     LV E/e' lateral: 13.1 LV SV:         63 LV SV Index:   33 LVOT Area:     3.46 cm  LV Volumes (MOD) LV vol d, MOD A2C: 94.0 ml LV vol d, MOD A4C: 71.6 ml LV vol s, MOD A2C: 41.5 ml LV vol s, MOD A4C: 17.2 ml LV SV MOD A2C:     52.5 ml LV SV MOD A4C:     71.6 ml LV SV MOD BP:      58.0 ml RIGHT VENTRICLE RV Basal diam:  2.23 cm LEFT ATRIUM             Index       RIGHT ATRIUM          Index LA diam:        3.50 cm 1.80 cm/m  RA Area:     5.88 cm LA Vol (A2C):   43.8 ml 22.52 ml/m RA Volume:   7.50 ml  3.86 ml/m LA Vol (A4C):   47.7 ml 24.53 ml/m LA Biplane Vol: 46.2 ml 23.76 ml/m  AORTIC VALVE                   PULMONIC VALVE AV Area (Vmax):    2.42 cm    PV Vmax:       1.09 m/s AV Area (Vmean):   2.56 cm    PV Vmean:      69.600 cm/s AV Area (VTI):     2.57 cm    PV VTI:        0.175 m AV Vmax:           131.00 cm/s PV Peak grad:  4.8 mmHg AV Vmean:          87.800 cm/s PV Mean grad:  2.0 mmHg AV VTI:            0.247 m AV Peak Grad:      6.9 mmHg AV Mean Grad:      4.0 mmHg LVOT Vmax:         91.70 cm/s LVOT Vmean:        64.800 cm/s LVOT VTI:          0.183 m LVOT/AV VTI ratio: 0.74  AORTA Ao Root diam: 3.00 cm MITRAL VALVE MV Area (PHT): 5.27 cm     SHUNTS MV Area VTI:   2.28 cm     Systemic VTI:  0.18 m MV Peak grad:  10.5 mmHg    Systemic Diam: 2.10 cm MV Mean grad:  4.0 mmHg MV Vmax:       1.62 m/s MV Vmean:      97.9 cm/s MV Decel Time: 144 msec MV E velocity: 120.00 cm/s MV A velocity: 154.00 cm/s MV E/A ratio:  0.78 Bartholome Bill MD Electronically signed by Bartholome Bill MD Signature Date/Time: 01/09/2021/3:36:24 PM    Final     Assessment & Plan       1. Chest pain/elevated troponin  -186, 1020 respectively;13,582. -Patient  is currently chest pain free; due to AKI, will defer invasive workup with a LHC at this time; if renal function improves, may consider further workup early next week pending clinical statushowever currently, patient appears stable and would defer contrast study at present. -Echocardiogramshowed preserved LV function with no high-grade valvular abnormalities. -Doing well off heparin. -Sublingual nitroglycerinwe will attempt to optimize medical managementappears to be tolerating increasedisosorbide mononitrate to 120 mg daily as well as ranolazine to 1000 mg twice daily. Blood pressure appears to be tolerating this fairly well. We will follow for further ischemia.  Not a candidate for invasive or contrast study from a cardiac standpoint at present.   OK for discharge on current regimen from cardiac standpoint.   2. History of HFpEF  -Chest xray revealing possible edema, BNP was slightly elevated but no clinical evidence of HFurine output was 1,450.   3. Acute on chronic renal insufficiency  -Creatinine improved to 3.75 from 3.91 yesterday. ;appreciate nephrology input.  4. Mitral insufficiency  -Mild moderate per recent echo;continue to follow clinically  5. Anemia-On Epogen   Signed, Javier Docker. Mica Releford MD 01/14/2021, 7:35 AM  Pager: [phone]

## 2021-01-14 NOTE — Progress Notes (Signed)
Inpatient Diabetes Program Recommendations  AACE/ADA: New Consensus Statement on Inpatient Glycemic Control (2015)  Target Ranges:  Prepandial:   less than 140 mg/dL      Peak postprandial:   less than 180 mg/dL (1-2 hours)      Critically ill patients:  140 - 180 mg/dL   Lab Results  Component Value Date   GLUCAP 207 (H) 01/14/2021   HGBA1C 8.3 (H) 01/10/2021    Review of Glycemic Control Results for Brittney [name], Brittney [name] (MRN [id number]) as of 01/14/2021 12:20  Ref. Range 01/13/2021 16:48 01/13/2021 21:01 01/14/2021 08:19  Glucose-Capillary Latest Ref Range: 70 - 99 mg/dL 231 (H) 319 (H) 207 (H)   Diabetes history: Type 2 DM Outpatient Diabetes medications: Lantus 16 units QHS, Glipizide 10 mg BID, Metformin 1000 mg BID Current orders for Inpatient glycemic control: Lantus 12 units QHS, Novolog 0-9 units TID, Novolog 0-5 units QHS  Inpatient Diabetes Program Recommendations:    Consider increasing Lantus to 16 units QHS (patient's home dose).   Thanks, Bronson Curb, MSN, RNC-OB Diabetes Coordinator [phone] (8a-5p)

## 2021-01-14 NOTE — Progress Notes (Signed)
[facility], [place] 01/14/21  Subjective:   LOS: 5 01/18 0701 - 01/19 0700 In: 240 [P.O.:240] Out: 1450 [Urine:1450] Good urine output of 1.4 L noted. Creatinine improved a bit to 3.75.  Objective:  Vital signs in last 24 hours:  Temp:  [98 F (36.7 C)-98.8 F (37.1 C)] 98 F (36.7 C) (01/19 1244) Pulse Rate:  [75-82] 82 (01/19 1244) Resp:  [18-20] 18 (01/19 1244) BP: (122-143)/(55-74) 143/74 (01/19 1244) SpO2:  [99 %-100 %] 100 % (01/19 1244) Weight:  [81.8 kg] 81.8 kg (01/19 0148)  Weight change: -0.726 kg Filed Weights   01/11/21 2100 01/13/21 0045 01/14/21 0148  Weight: 85.2 kg 82.6 kg 81.8 kg    Intake/Output:    Intake/Output Summary (Last 24 hours) at 01/14/2021 1310 Last data filed at 01/14/2021 0542 Gross per 24 hour  Intake -  Output 900 ml  Net -900 ml    Physical Exam: General:  Resting in bed, in no acute distress  HEENT  normocephalic, atraumatic, oral mucous membranes moist  Pulm/lungs  clear bilateral, normal effort  CVS/Heart  S1-S2, no rubs or gallops  Abdomen:   Soft, nontender, nondistended  Extremities:  Trace peripheral edema  Neurologic:  Awake, alert, oriented  Skin:  No acute rashes or lesions      Basic Metabolic Panel:  Recent Labs  Lab 01/10/21 0556 01/11/21 0424 01/12/21 0402 01/13/21 0454 01/14/21 0429  NA 139 134* 134* 132* 135  K 4.4 4.5 5.1 4.9 5.1  CL 104 101 101 98 101  CO2 '24 23 24 23 25  '$ GLUCOSE 219* 250* 214* 236* 196*  BUN 49* 43* 40* 42* 40*  CREATININE 4.06* 3.85* 4.01* 3.91* 3.75*  CALCIUM 8.8* 8.7* 8.8* 8.9 9.1  MG  --  2.6* 2.7*  --  2.6*  PHOS  --  3.0 3.8  --  4.2     CBC: Recent Labs  Lab 01/09/21 0813 01/10/21 1111 01/11/21 0424 01/11/21 1444 01/12/21 0402 01/13/21 0454 01/14/21 0429  WBC 9.5 9.0 12.0*  --  10.9*  --   --   HGB 10.1* 8.4* 7.9* 8.6* 7.9* 8.1* 8.0*  HCT 32.2* 27.1* 25.0* 26.6* 25.5* 24.7* 24.6*  MCV 80.5 79.7* 80.1  --  80.7  --   --   PLT  571* 517* 456*  --  475*  --   --       Lab Results  Component Value Date   HEPBSAG NON REACTIVE 02/02/2020   HEPBSAB NON REACTIVE 02/02/2020   HEPBIGM NON REACTIVE 02/02/2020      Microbiology:  Recent Results (from the past 240 hour(s))  Resp Panel by RT-PCR (Flu A&B, Covid) Nasopharyngeal Swab     Status: None   Collection Time: 01/09/21  9:55 AM   Specimen: Nasopharyngeal Swab; Nasopharyngeal(NP) swabs in vial transport medium  Result Value Ref Range Status   SARS Coronavirus 2 by RT PCR NEGATIVE NEGATIVE Final    Comment: (NOTE) SARS-CoV-2 target nucleic acids are NOT DETECTED.  The SARS-CoV-2 RNA is generally detectable in upper respiratory specimens during the acute phase of infection. The lowest concentration of SARS-CoV-2 viral copies this assay can detect is 138 copies/mL. A negative result does not preclude SARS-Cov-2 infection and should not be used as the sole basis for treatment or other patient management decisions. A negative result may occur with  improper specimen collection/handling, submission of specimen other than nasopharyngeal swab, presence of viral mutation(s) within the areas targeted by this assay, and  inadequate number of viral copies(<138 copies/mL). A negative result must be combined with clinical observations, patient history, and epidemiological information. The expected result is Negative.  Fact Sheet for Patients:  EntrepreneurPulse.com.au  Fact Sheet for Healthcare Providers:  IncredibleEmployment.be  This test is no t yet approved or cleared by the [place] FDA and  has been authorized for detection and/or diagnosis of SARS-CoV-2 by FDA under an Emergency Use Authorization (EUA). This EUA will remain  in effect (meaning this test can be used) for the duration of the COVID-19 declaration under Section 564(b)(1) of the Act, 21 U.S.C.section 360bbb-3(b)(1), unless the authorization is terminated   or revoked sooner.       Influenza A by PCR NEGATIVE NEGATIVE Final   Influenza B by PCR NEGATIVE NEGATIVE Final    Comment: (NOTE) The Xpert Xpress SARS-CoV-2/FLU/RSV plus assay is intended as an aid in the diagnosis of influenza from Nasopharyngeal swab specimens and should not be used as a sole basis for treatment. Nasal washings and aspirates are unacceptable for Xpert Xpress SARS-CoV-2/FLU/RSV testing.  Fact Sheet for Patients: EntrepreneurPulse.com.au  Fact Sheet for Healthcare Providers: IncredibleEmployment.be  This test is not yet approved or cleared by the [place] FDA and has been authorized for detection and/or diagnosis of SARS-CoV-2 by FDA under an Emergency Use Authorization (EUA). This EUA will remain in effect (meaning this test can be used) for the duration of the COVID-19 declaration under Section 564(b)(1) of the Act, 21 U.S.C. section 360bbb-3(b)(1), unless the authorization is terminated or revoked.  Performed at [facility], [address] [place], [place] [address]     Coagulation Studies: No results for input(s): LABPROT, INR in the last 72 hours.  Urinalysis: No results for input(s): COLORURINE, LABSPEC, PHURINE, GLUCOSEU, HGBUR, BILIRUBINUR, KETONESUR, PROTEINUR, UROBILINOGEN, NITRITE, LEUKOCYTESUR in the last 72 hours.  Invalid input(s): APPERANCEUR    Imaging: No results found.   Medications:    . amLODipine  10 mg Oral Daily  . aspirin EC  81 mg Oral Daily  . atorvastatin  40 mg Oral Daily  . calcitRIOL  0.25 mcg Oral Daily  . [START ON 01/15/2021] epoetin (EPOGEN/PROCRIT) injection  20,000 Units Subcutaneous Q T,Th,Sa-HD  . heparin injection (subcutaneous)  5,000 Units Subcutaneous Q8H  . hydrALAZINE  25 mg Oral BID  . insulin aspart  0-5 Units Subcutaneous QHS  . insulin aspart  0-9 Units Subcutaneous TID WC  . insulin glargine  12 Units Subcutaneous QHS  . isosorbide  mononitrate  120 mg Oral Daily  . loratadine  10 mg Oral Daily  . metoprolol succinate  100 mg Oral Daily  . multivitamin with minerals  1 tablet Oral Daily  . pantoprazole  40 mg Oral Daily  . ranolazine  1,000 mg Oral BID   acetaminophen, albuterol, hydrALAZINE, morphine injection, nitroGLYCERIN, ondansetron (ZOFRAN) IV  Assessment/ Plan:  80 y.o. 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  Kidney disease stage V Recent Labs    01/11/21 0424 01/12/21 0402  01/13/21 0454 01/14/21 0429  CREATININE 3.85* 4.01* 3.91* 3.75*  Baseline Creatinine 3.10 / GFR 14-16 on 11/25/2020 Presenting Creatinine 4.33/GFR 10 AKI versus progression of underlying chronic kidney disease  Creatinine down to 3.75 with a urine output of 1.4 L over the preceding 24 hours.  Therefore no immediate need for dialysis but suspect that she will need it in the relative near future.  #. Anemia of CKD Lab Results  Component Value Date   HGB 8.0 (L) 01/14/2021   Continue Epogen 20,000 units subcutaneous weekly.  #.  Secondary hyperparathyroidism     Component Value Date/Time   PTH 35 02/02/2020 0553   Lab Results  Component Value Date   PHOS 4.2 01/14/2021  Maintain the patient on calcitriol 0.25 mcg p.o. daily.  #. HTN with CKD Continue amlodipine, hydralazine, Imdur, metoprolol  #. Diabetes type 2 with CKD Hgb A1c MFr Bld (%)  Date Value  01/10/2021 8.3 (H)  Management as  per hospitalist    LOS: 5 Brittney [name] 1/19/20221:10 [place], [place]

## 2021-01-14 NOTE — Care Management Important Message (Signed)
Important Message  Patient Details  Name: MARLEA VERZOSA MRN: [id number] Date of Birth: 1941-12-24   Medicare Important Message Given:  Yes     Dannette Barbara 01/14/2021, 10:57 AM

## 2021-01-15 ENCOUNTER — Inpatient Hospital Stay: Payer: Medicare HMO

## 2021-01-15 ENCOUNTER — Inpatient Hospital Stay: Payer: Medicare HMO | Admitting: Oncology

## 2021-01-15 LAB — BASIC METABOLIC PANEL
Anion gap: 11 (ref 5–15)
BUN: 45 mg/dL — ABNORMAL HIGH (ref 8–23)
CO2: 25 mmol/L (ref 22–32)
Calcium: 9.1 mg/dL (ref 8.9–10.3)
Chloride: 97 mmol/L — ABNORMAL LOW (ref 98–111)
Creatinine, Ser: 3.82 mg/dL — ABNORMAL HIGH (ref 0.44–1.00)
GFR, Estimated: 11 mL/min — ABNORMAL LOW (ref >60)
Glucose, Bld: 281 mg/dL — ABNORMAL HIGH (ref 70–99)
Potassium: 5.2 mmol/L — ABNORMAL HIGH (ref 3.5–5.1)
Sodium: 133 mmol/L — ABNORMAL LOW (ref 135–145)

## 2021-01-15 LAB — GLUCOSE, CAPILLARY
Glucose-Capillary: 342 mg/dL — ABNORMAL HIGH (ref 70–99)
Glucose-Capillary: 288 mg/dL — ABNORMAL HIGH (ref 70–99)
Glucose-Capillary: 247 mg/dL — ABNORMAL HIGH (ref 70–99)
Glucose-Capillary: 261 mg/dL — ABNORMAL HIGH (ref 70–99)

## 2021-01-15 MED ORDER — SODIUM POLYSTYRENE SULFONATE 15 GM/60ML PO SUSP
15.0000 g | Freq: Once | ORAL | Status: AC
  Administered 2021-01-15: 15 g via ORAL
  Filled 2021-01-15: qty 60

## 2021-01-15 MED ORDER — INSULIN GLARGINE 100 UNIT/ML ~~LOC~~ SOLN
22.0000 [IU] | Freq: Every day | SUBCUTANEOUS | Status: DC
  Administered 2021-01-15: 22 [IU] via SUBCUTANEOUS
  Filled 2021-01-15 (×2): qty 0.22

## 2021-01-15 NOTE — Evaluation (Signed)
Occupational Therapy Evaluation Patient Details Name: Brittney [name] MRN: [id number] DOB: 22-Dec-1941 Today's Date: 01/15/2021    History of Present Illness Brittney [name] is an 31yoF who comes to [facility] 1/14 c CP and SOB. In ED pt tachycardic, COVID negative, high sensitivity troponin 186, 1020, 5201, 5528, 13582. Pt underwent Left heart cath on 1/18 revealing of 70% stenosis of distal and proximal LAD. RCA had a 99% mid to distal lesion with calcification. Cardiology recommending medical management due to worsening renal function. PMH: CAD, HFpEF, aortic valve stenosis, DM2, CKD4, HLD, HTN.   Clinical Impression   Pt seen for OT evaluation this date in setting of acute hospitalization for NSTEMI. Pt reports being MOD I for ADLs/ADL mobility at baseline and has family assist for IADLs. Pt presents this date performing ADLs/ADL mobility at [facility] initially for safety on assessment, but ultimately at MOD I-her reported baseline. OT educates pt re: role of OT and pt voices no acute concerns with her ADL performance. Pt strength and tolerance is generally functional for her described home environment and support. Safe to return to that environment with family assist as needed once stable, from OT standpoint. Will complete order at this time. Thank you for consulting OT in the care of this pleasant patient.    Follow Up Recommendations  No OT follow up    Equipment Recommendations  None recommended by OT    Recommendations for Other Services       Precautions / Restrictions Precautions Precautions: Fall Precaution Comments: Watch EKG, rate; K+ Restrictions Weight Bearing Restrictions: No      Mobility Bed Mobility Overal bed mobility: Modified Independent                  Transfers   Equipment used: Rolling walker (2 wheeled) Transfers: Chartered loss adjuster Transfers Sit to Stand: Supervision;Modified independent (Device/Increase time) Stand pivot transfers:  Supervision;Modified independent (Device/Increase time)       General transfer comment: no LOB, increased time, ultimately performs at MOD I, OT provided SUPV for safety on initial assessment.    Balance Overall balance assessment: Modified Independent;Mild deficits observed, not formally tested                                         ADL either performed or assessed with clinical judgement   ADL Overall ADL's : Modified independent;At baseline                                       General ADL Comments: SUPV for some standing ADLs/ADL mobility initially for safety, but ultimately pt performs with no assist from OT.     Vision Patient Visual Report: No change from baseline       Perception     Praxis      Pertinent Vitals/Pain Pain Assessment: No/denies pain     Hand Dominance Right   Extremity/Trunk Assessment Upper Extremity Assessment Upper Extremity Assessment: Overall WFL for tasks assessed   Lower Extremity Assessment Lower Extremity Assessment: Overall WFL for tasks assessed       Communication Communication Communication: No difficulties   Cognition Arousal/Alertness: Awake/alert Behavior During Therapy: WFL for tasks assessed/performed Overall Cognitive Status: Within Functional Limits for tasks assessed  General Comments       Exercises Other Exercises Other Exercises: OT facilitates ed re: role of OT in acute setting. Pt with good understanding   Shoulder Instructions      Home Living Family/patient expects to be discharged to:: Private residence Living Arrangements: Children (lives with daughter) Available Help at Discharge: Family;Available PRN/intermittently (alone during the day, pt's daughter working. Also has help from grand and great-grand children in the area.) Type of Home: House Home Access: Stairs to enter [employer] of Steps:  3-4 Entrance Stairs-Rails: None Home Layout: One level     Bathroom Shower/Tub: Teacher, early years/pre: Standard     Home Equipment: Environmental consultant - 2 wheels;Cane - single point          Prior Functioning/Environment Level of Independence: Needs assistance  Gait / Transfers Assistance Needed: INDEP with fxl mobility in the home, requires SPC in the community. ADL's / Homemaking Assistance Needed: Pt able to perform basic ADLs I'ly/MOD I. Requries family assist for IADLs including transportation to appts, getting groceries, laundry, cooking and cleaning.   Comments: Pt reports no falls in past 6 months. family helps with groceries.        OT Problem List: Decreased strength;Decreased activity tolerance      OT Treatment/Interventions: Self-care/ADL training;Therapeutic activities    OT Goals(Current goals can be found in the care plan section) Acute Rehab OT Goals Patient Stated Goal: get stronger and go home OT Goal Formulation: All assessment and education complete, DC therapy  OT Frequency:     Barriers to D/C:            Co-evaluation              AM-PAC OT "6 Clicks" Daily Activity     Outcome Measure Help from another person eating meals?: None Help from another person taking care of personal grooming?: None Help from another person toileting, which includes using toliet, bedpan, or urinal?: None Help from another person bathing (including washing, rinsing, drying)?: A Little Help from another person to put on and taking off regular upper body clothing?: None Help from another person to put on and taking off regular lower body clothing?: None 6 Click Score: 23   End of Session Equipment Utilized During Treatment: Gait belt;Rolling walker Nurse Communication: Mobility status  Activity Tolerance: Patient tolerated treatment well Patient left: in bed;with call bell/phone within reach  OT Visit Diagnosis: Muscle weakness (generalized) (M62.81)                 Time: [id number] OT Time Calculation (min): 23 min Charges:  OT General Charges $OT Visit: 1 Visit OT Evaluation $OT Eval Low Complexity: 1 Low OT Treatments $Self Care/Home Management : 8-22 mins  Gerrianne Scale, [place], OTR/L ascom [phone] 01/15/21, 5:08 PM

## 2021-01-15 NOTE — Progress Notes (Signed)
[facility], [place] 01/15/21  Subjective:   LOS: 6 01/19 0701 - 01/20 0700 In: 480 [P.O.:480] Out: 465 [Urine:465] Patient seen and evaluated at bedside. Urine output dropped a bit to full 65 cc over the preceding 24 hours. Creatinine currently 3.8.  Objective:  Vital signs in last 24 hours:  Temp:  [97.9 F (36.6 C)-98.9 F (37.2 C)] 98.3 F (36.8 C) (01/20 1627) Pulse Rate:  [72-87] 77 (01/20 1627) Resp:  [16-20] 18 (01/20 1627) BP: (123-154)/(61-98) 130/63 (01/20 1627) SpO2:  [97 %-100 %] 97 % (01/20 1627) Weight:  [82.8 kg] 82.8 kg (01/20 0250)  Weight change: 0.953 kg Filed Weights   01/13/21 0045 01/14/21 0148 01/15/21 0250  Weight: 82.6 kg 81.8 kg 82.8 kg    Intake/Output:    Intake/Output Summary (Last 24 hours) at 01/15/2021 1712 Last data filed at 01/15/2021 1300 Gross per 24 hour  Intake 960 ml  Output 735 ml  Net 225 ml    Physical Exam: General:  Resting in bed, in no acute distress  HEENT  normocephalic, atraumatic, oral mucous membranes moist  Pulm/lungs  clear bilateral, normal effort  CVS/Heart  S1-S2, no rubs or gallops  Abdomen:   Soft, nontender, nondistended  Extremities:  Trace peripheral edema  Neurologic:  Awake, alert, oriented  Skin:  No acute rashes or lesions      Basic Metabolic Panel:  Recent Labs  Lab 01/11/21 0424 01/12/21 0402 01/13/21 0454 01/14/21 0429 01/15/21 0456  NA 134* 134* 132* 135 133*  K 4.5 5.1 4.9 5.1 5.2*  CL 101 101 98 101 97*  CO2 23 24 23 25 25   GLUCOSE 250* 214* 236* 196* 281*  BUN 43* 40* 42* 40* 45*  CREATININE 3.85* 4.01* 3.91* 3.75* 3.82*  CALCIUM 8.7* 8.8* 8.9 9.1 9.1  MG 2.6* 2.7*  --  2.6*  --   PHOS 3.0 3.8  --  4.2  --      CBC: Recent Labs  Lab 01/09/21 0813 01/10/21 1111 01/11/21 0424 01/11/21 1444 01/12/21 0402 01/13/21 0454 01/14/21 0429  WBC 9.5 9.0 12.0*  --  10.9*  --   --   HGB 10.1* 8.4* 7.9* 8.6* 7.9* 8.1* 8.0*  HCT 32.2* 27.1* 25.0*  26.6* 25.5* 24.7* 24.6*  MCV 80.5 79.7* 80.1  --  80.7  --   --   PLT 571* 517* 456*  --  475*  --   --       Lab Results  Component Value Date   HEPBSAG NON REACTIVE 02/02/2020   HEPBSAB NON REACTIVE 02/02/2020   HEPBIGM NON REACTIVE 02/02/2020      Microbiology:  Recent Results (from the past 240 hour(s))  Resp Panel by RT-PCR (Flu A&B, Covid) Nasopharyngeal Swab     Status: None   Collection Time: 01/09/21  9:55 AM   Specimen: Nasopharyngeal Swab; Nasopharyngeal(NP) swabs in vial transport medium  Result Value Ref Range Status   SARS Coronavirus 2 by RT PCR NEGATIVE NEGATIVE Final    Comment: (NOTE) SARS-CoV-2 target nucleic acids are NOT DETECTED.  The SARS-CoV-2 RNA is generally detectable in upper respiratory specimens during the acute phase of infection. The lowest concentration of SARS-CoV-2 viral copies this assay can detect is 138 copies/mL. A negative result does not preclude SARS-Cov-2 infection and should not be used as the sole basis for treatment or other patient management decisions. A negative result may occur with  improper specimen collection/handling, submission of specimen other than nasopharyngeal swab, presence  of viral mutation(s) within the areas targeted by this assay, and inadequate number of viral copies(<138 copies/mL). A negative result must be combined with clinical observations, patient history, and epidemiological information. The expected result is Negative.  Fact Sheet for Patients:  EntrepreneurPulse.com.au  Fact Sheet for Healthcare Providers:  IncredibleEmployment.be  This test is no t yet approved or cleared by the [place] FDA and  has been authorized for detection and/or diagnosis of SARS-CoV-2 by FDA under an Emergency Use Authorization (EUA). This EUA will remain  in effect (meaning this test can be used) for the duration of the COVID-19 declaration under Section 564(b)(1) of the Act,  21 U.S.C.section 360bbb-3(b)(1), unless the authorization is terminated  or revoked sooner.       Influenza A by PCR NEGATIVE NEGATIVE Final   Influenza B by PCR NEGATIVE NEGATIVE Final    Comment: (NOTE) The Xpert Xpress SARS-CoV-2/FLU/RSV plus assay is intended as an aid in the diagnosis of influenza from Nasopharyngeal swab specimens and should not be used as a sole basis for treatment. Nasal washings and aspirates are unacceptable for Xpert Xpress SARS-CoV-2/FLU/RSV testing.  Fact Sheet for Patients: EntrepreneurPulse.com.au  Fact Sheet for Healthcare Providers: IncredibleEmployment.be  This test is not yet approved or cleared by the [place] FDA and has been authorized for detection and/or diagnosis of SARS-CoV-2 by FDA under an Emergency Use Authorization (EUA). This EUA will remain in effect (meaning this test can be used) for the duration of the COVID-19 declaration under Section 564(b)(1) of the Act, 21 U.S.C. section 360bbb-3(b)(1), unless the authorization is terminated or revoked.  Performed at [facility], [address] [place], [place] [address]     Coagulation Studies: No results for input(s): LABPROT, INR in the last 72 hours.  Urinalysis: No results for input(s): COLORURINE, LABSPEC, PHURINE, GLUCOSEU, HGBUR, BILIRUBINUR, KETONESUR, PROTEINUR, UROBILINOGEN, NITRITE, LEUKOCYTESUR in the last 72 hours.  Invalid input(s): APPERANCEUR    Imaging: No results found.   Medications:    . amLODipine  10 mg Oral Daily  . aspirin EC  81 mg Oral Daily  . atorvastatin  40 mg Oral Daily  . calcitRIOL  0.25 mcg Oral Daily  . epoetin (EPOGEN/PROCRIT) injection  20,000 Units Subcutaneous Q T,Th,Sa-HD  . heparin injection (subcutaneous)  5,000 Units Subcutaneous Q8H  . hydrALAZINE  25 mg Oral BID  . insulin aspart  0-5 Units Subcutaneous QHS  . insulin aspart  0-9 Units Subcutaneous TID WC  . insulin  glargine  22 Units Subcutaneous QHS  . isosorbide mononitrate  120 mg Oral Daily  . loratadine  10 mg Oral Daily  . metoprolol succinate  100 mg Oral Daily  . multivitamin with minerals  1 tablet Oral Daily  . pantoprazole  40 mg Oral Daily  . ranolazine  1,000 mg Oral BID   acetaminophen, albuterol, hydrALAZINE, morphine injection, nitroGLYCERIN, ondansetron (ZOFRAN) IV  Assessment/ Plan:  80 y.o. female with hypertension, systolic congestive heart failure, hyperlipidemia, diabetes mellitus type II insulin dependent, aortic valve stenosis  Admitted on 01/09/2021 for   Principal Problem:   NSTEMI (non-ST elevated myocardial infarction) [facility]) Active Problems:   Benign essential HTN   Type II diabetes mellitus with renal manifestations (HCC)   Hyperlipidemia   CAD (coronary artery disease)   Anemia in chronic kidney disease   Acute renal failure superimposed on stage 4 chronic kidney disease (HCC)   Chronic diastolic CHF (congestive heart failure) ([place])   #.  Kidney disease stage V Recent  Labs    01/12/21 0402 01/13/21 0454 01/14/21 0429 01/15/21 0456  CREATININE 4.01* 3.91* 3.75* 3.82*  Baseline Creatinine 3.10 / GFR 14-16 on 11/25/2020 Presenting Creatinine 4.33/GFR 10 AKI versus progression of underlying chronic kidney disease  The patient's urine output has been fluctuating a bit.  Creatinine about the same at 3.8 with an EGFR of 11.  Counseled the patient that she will likely need renal placement therapy in the relative near future.  #. Anemia of CKD Lab Results  Component Value Date   HGB 8.0 (L) 01/14/2021   Continue Epogen 20,000 units subcutaneous weekly.  #.  Secondary hyperparathyroidism     Component Value Date/Time   PTH 35 02/02/2020 0553   Lab Results  Component Value Date   PHOS 4.2 01/14/2021  Maintain the patient on calcitriol 0.25 mcg p.o. daily.  Phosphorus currently acceptable at 4.2.  #. HTN Continue amlodipine, hydralazine, Imdur,  metoprolol.  Blood pressure currently 152/64.  #. Diabetes type 2 with CKD Hgb A1c MFr Bld (%)  Date Value  01/10/2021 8.3 (H)  Management as per hospitalist    LOS: 6 Dayelin Balducci [place] 1/20/20225:12 PM  [place], [place]

## 2021-01-15 NOTE — Evaluation (Signed)
Physical Therapy Evaluation Patient Details Name: Brittney [name] MRN: [id number] DOB: 22-Oct-1941 Today's Date: 01/15/2021   History of Present Illness  Brittney [name] is an 59yoF who comes to [facility] 1/14 c CP and SOB. In ED pt tachycardic, COVID negative, high sensitivity troponin 186, 1020, 5201, 5528, 13582. Pt underwent Left heart cath on 1/18 revealing of 70% stenosis of distal and proximal LAD. RCA had a 99% mid to distal lesion with calcification. Cardiology recommending medical management due to worsening renal function. PMH: CAD, HFpEF, aortic valve stenosis, DM2, CKD4, HLD, HTN.  Clinical Impression  Pt admitted with above diagnosis. Pt currently with functional limitations due to the deficits listed below (see "PT Problem List"). Upon entry, pt in bed, awake and agreeable to participate. The pt is alert, pleasant, interactive, and able to provide info regarding prior level of function, both in tolerance and independence. Patient's performance this date reveals decreased ability, independence, and tolerance in performing all basic mobility required for performance of activities of daily living. DOE not correlated with desaturation, I presume cardiac in nature, but pt reports very near baseline DOE. Pt reports significant loss of strength compared to baseline. Pt requires additional DME, close physical assistance, and cues for safe participate in mobility. Pt will benefit from skilled PT intervention to increase independence and safety with basic mobility in preparation for discharge to the venue listed below.       Follow Up Recommendations Home health PT;Supervision - Intermittent    Equipment Recommendations  Rolling walker with 5" wheels    Recommendations for Other Services       Precautions / Restrictions Precautions Precautions: Fall Precaution Comments: Watch EKG, rate; K+ Restrictions Weight Bearing Restrictions: No      Mobility  Bed Mobility Overal bed mobility:  Modified Independent                  Transfers Overall transfer level: Needs assistance Equipment used: Rolling walker (2 wheeled);1 person hand held assist Transfers: Sit to/from [facility] Sit to Stand: Supervision Stand pivot transfers: Supervision       General transfer comment: No gross LOB, some initital urine incont upon standing, none thereafter  Ambulation/Gait Ambulation/Gait assistance: Supervision Gait Distance (Feet): 200 Feet Assistive device: Rolling walker (2 wheeled) Gait Pattern/deviations: WFL(Within Functional Limits) Gait velocity: 0.19ms   General Gait Details: steady, but slow; pauses twice for DOE of cardiac origin (no hypoxia) pt rates as near baseline; Strength significantly off baseline; HR 110 or less througout.  Stairs            Wheelchair Mobility    Modified Rankin (Stroke Patients Only)       Balance Overall balance assessment: Modified Independent;Mild deficits observed, not formally tested                                           Pertinent Vitals/Pain Pain Assessment: No/denies pain    Home Living Family/patient expects to be discharged to:: Private residence Living Arrangements: Other relatives (Niece, nephew;) Available Help at Discharge: Family (alone during the day) Type of Home: House Home Access: Stairs to enter Entrance Stairs-Rails: None Entrance Stairs-Number of Steps: 3-4 Home Layout: One level Home Equipment: WEnvironmental consultant- 2 wheels;Cane - single point      Prior Function Level of Independence: Independent with assistive device(s)         Comments: Pt  independent with ambulation in home (uses SPC outside and in community).  Pt reports no falls in past 6 months. family helps with groceries.     Hand Dominance   Dominant Hand: Right    Extremity/Trunk Assessment   Upper Extremity Assessment Upper Extremity Assessment: Overall WFL for tasks assessed    Lower  Extremity Assessment Lower Extremity Assessment: Overall WFL for tasks assessed       Communication      Cognition Arousal/Alertness: Awake/alert Behavior During Therapy: WFL for tasks assessed/performed Overall Cognitive Status: Within Functional Limits for tasks assessed                                        General Comments      Exercises     Assessment/Plan    PT Assessment Patient needs continued PT services  PT Problem List Decreased balance;Decreased strength;Decreased activity tolerance;Cardiopulmonary status limiting activity       PT Treatment Interventions DME instruction;Therapeutic exercise;Gait training;Functional mobility training;Therapeutic activities;Patient/family education;Balance training    PT Goals (Current goals can be found in the Care Plan section)  Acute Rehab PT Goals Patient Stated Goal: regain stength for independent return to home. PT Goal Formulation: With patient Time For Goal Achievement: 01/29/21 Potential to Achieve Goals: Good    Frequency Min 2X/week   Barriers to discharge Decreased caregiver support alone during daytime typically.    Co-evaluation               AM-PAC PT "6 Clicks" Mobility  Outcome Measure Help needed turning from your back to your side while in a flat bed without using bedrails?: None Help needed moving from lying on your back to sitting on the side of a flat bed without using bedrails?: None Help needed moving to and from a bed to a chair (including a wheelchair)?: A Little Help needed standing up from a chair using your arms (e.g., wheelchair or bedside chair)?: A Little Help needed to walk in hospital room?: A Little Help needed climbing 3-5 steps with a railing? : A Little 6 Click Score: 20    End of Session Equipment Utilized During Treatment: Gait belt Activity Tolerance: Patient tolerated treatment well;No increased pain;Patient limited by fatigue Patient left: in bed;with  call bell/phone within reach;with family/visitor present (at EOB per request) Nurse Communication: Mobility status PT Visit Diagnosis: Other abnormalities of gait and mobility (R26.89);Muscle weakness (generalized) (M62.81);Difficulty in walking, not elsewhere classified (R26.2)    Time: [id number] PT Time Calculation (min) (ACUTE ONLY): 44 min   Charges:   PT Evaluation $PT Eval Low Complexity: 1 Low PT Treatments $Therapeutic Exercise: 23-37 mins        11:18 AM, 01/15/21 Etta Grandchild, PT, DPT Physical Therapist - [facility]  [phone] ([place])   Marcelene Weidemann C 01/15/2021, 11:17 AM

## 2021-01-15 NOTE — Progress Notes (Signed)
[facility] Cardiology    SUBJECTIVE: Brittney [name] is an 80 year old female with a past medical history significant for coronary artery disease, HFpEF, aortic valve stenosis, type 2 diabetes, chronic kidney disease, hyperlipidemia, and hypertension who presented to the ED on 01/09/21 for a one day history of chest pain with associated shortness of breath. Workup in the ED was significant for ECG revealing sinus tachycardia, rate of 111bpm, with nonspecific ST abnormalities, chest xray revealing suspected acute interstitial edema, high sensitivity troponin 186, 1020, 5201, 5528, 13582 respectively, BNP of 176, COVID-19 negative, creatinine of 4.33, and a GFR of 10.    She is followed in outpatient cardiology by Dr. Clayborn Bigness.  Left heart catheterization on 01/23/19 revealed moderate to severe disease in the proximal LAD, 70% stenosed, as well as 70% stenosis in the distal LAD.  RCA had a 99% mid to distal lesion with calcification.   01/15/21: No recurrent episodes of chest pain.     Vitals:   01/14/21 2212 01/15/21 0250 01/15/21 0515 01/15/21 0723  BP: 132/61  (!) 154/67 (!) 152/64  Pulse: 72  81 82  Resp: '20  16 20  '$ Temp: 98.2 F (36.8 C)  98.1 F (36.7 C) 98.9 F (37.2 C)  TempSrc: Oral  Oral Oral  SpO2: 100%  99% 99%  Weight:  82.8 kg    Height:         Intake/Output Summary (Last 24 hours) at 01/15/2021 0755 Last data filed at 01/15/2021 [id number] Gross per 24 hour  Intake 480 ml  Output 465 ml  Net 15 ml      PHYSICAL EXAM  Heart: HRRR . Normal S1 and S2 without gallops or murmurs.    LABS: Basic Metabolic Panel: Recent Labs    01/14/21 0429 01/15/21 0456  NA 135 133*  K 5.1 5.2*  CL 101 97*  CO2 25 25  GLUCOSE 196* 281*  BUN 40* 45*  CREATININE 3.75* 3.82*  CALCIUM 9.1 9.1  MG 2.6*  --   PHOS 4.2  --    Liver Function Tests: No results for input(s): AST, ALT, ALKPHOS, BILITOT, PROT, ALBUMIN in the last 72 hours. No results for input(s): LIPASE, AMYLASE in the last 72  hours. CBC: Recent Labs    01/13/21 0454 01/14/21 0429  HGB 8.1* 8.0*  HCT 24.7* 24.6*   Cardiac Enzymes: No results for input(s): CKTOTAL, CKMB, CKMBINDEX, TROPONINI in the last 72 hours. BNP: Invalid input(s): POCBNP D-Dimer: No results for input(s): DDIMER in the last 72 hours. Hemoglobin A1C: No results for input(s): HGBA1C in the last 72 hours. Fasting Lipid Panel: No results for input(s): CHOL, HDL, LDLCALC, TRIG, CHOLHDL, LDLDIRECT in the last 72 hours. Thyroid Function Tests: No results for input(s): TSH, T4TOTAL, T3FREE, THYROIDAB in the last 72 hours.  Invalid input(s): FREET3 Anemia Panel: No results for input(s): VITAMINB12, FOLATE, FERRITIN, TIBC, IRON, RETICCTPCT in the last 72 hours.  No results found.   Echo: Normal RV and LV systolic function with an EF estimated between 60-65% with mild LVH, grade 1 diastolic dysfunction.  Mild MR.    ASSESSMENT AND PLAN:  Principal Problem:   NSTEMI (non-ST elevated myocardial infarction) ([place]) Active Problems:   Benign essential HTN   Type II diabetes mellitus with renal manifestations (HCC)   Hyperlipidemia   CAD (coronary artery disease)   Anemia in chronic kidney disease   Acute renal failure superimposed on stage 4 chronic kidney disease (HCC)   Chronic diastolic CHF (congestive heart failure) ([place])  1.  NSTEMI  -High sensitivity troponin 186, 1020, 5201, 5528, 13582 respectively  -Due to worsening renal function, medical management was recommended; with no recurrent chest pain, will continue to treat medically   -Continue aspirin '81mg'$  daily, amlodipine '10mg'$  daily, Imdur '120mg'$  daily, metoprolol '100mg'$  daily, and Ranexa '1000mg'$  BID   -No further workup indicated from a cardiac standpoint this admission; recommend follow up with Dr. Clayborn Bigness within 1 week of discharge   2.  Chronic kidney disease   -Nephrology consulted; no plans for hemodialysis this admission   Cardiology signing off.  Please contact on  call physician with questions or concerns.   The history, physical exam findings, and plan of care were all discussed with Dr. Bartholome Bill, and all decision making was made in collaboration.     Avie Arenas  PA-C 01/15/2021 7:55 AM

## 2021-01-15 NOTE — Progress Notes (Signed)
PROGRESS NOTE    Brittney [name]  [id number] DOB: 09-04-1941 DOA: 01/09/2021 PCP: Elisabeth Cara, NP    Brief Narrative:  This 80 years old female with PMH significant for hypertension, hyperlipidemia, DM, GERD, CKD stage IV, diastolic CHF, aortic valve stenosis, CAD presents with chest pain associated with shortness of breath.  Patient describes having chest pain since morning associated with shortness of breath,  pain radiating towards left side of chest.  She is found to have elevated troponins. VQ scan negative for PE.  She is admitted for non-STEMI, started on heparin.  Cardiology was consulted,  recommended there is no plan for any invasive intervention at this time since patient's chest pain has resolved, and worsening renal functions.  Nephrology was consulted , there is high likelihood that she will need hemodialysis during this hospitalization.  1/19-no overnight issues 1/20-bg elevated. K up at 5.2 today. Creatinine worse mildly  Consultants:   Nephrology, cardiology  Procedures:   Antimicrobials:       Subjective: Pt without sob, cp, abd. Pain. Has banana next to her bed, but I did ask her not to eat it. Avoid potatoes and sweet potatoes too  Objective: Vitals:   01/14/21 2212 01/15/21 0250 01/15/21 0515 01/15/21 0723  BP: 132/61  (!) 154/67 (!) 152/64  Pulse: 72  81 82  Resp: '20  16 20  '$ Temp: 98.2 F (36.8 C)  98.1 F (36.7 C) 98.9 F (37.2 C)  TempSrc: Oral  Oral Oral  SpO2: 100%  99% 99%  Weight:  82.8 kg    Height:        Intake/Output Summary (Last 24 hours) at 01/15/2021 0815 Last data filed at 01/15/2021 [id number] Gross per 24 hour  Intake 480 ml  Output 465 ml  Net 15 ml   Filed Weights   01/13/21 0045 01/14/21 0148 01/15/21 0250  Weight: 82.6 kg 81.8 kg 82.8 kg    Examination: Calm, nad cta no w/r/r Regular s1/s2 Soft benign, +bs Trace pedal edema b/l Grossly intact    Data Reviewed: I have personally reviewed following labs and  imaging studies  CBC: Recent Labs  Lab 01/09/21 0813 01/10/21 1111 01/11/21 0424 01/11/21 1444 01/12/21 0402 01/13/21 0454 01/14/21 0429  WBC 9.5 9.0 12.0*  --  10.9*  --   --   HGB 10.1* 8.4* 7.9* 8.6* 7.9* 8.1* 8.0*  HCT 32.2* 27.1* 25.0* 26.6* 25.5* 24.7* 24.6*  MCV 80.5 79.7* 80.1  --  80.7  --   --   PLT 571* 517* 456*  --  475*  --   --    Basic Metabolic Panel: Recent Labs  Lab 01/11/21 0424 01/12/21 0402 01/13/21 0454 01/14/21 0429 01/15/21 0456  NA 134* 134* 132* 135 133*  K 4.5 5.1 4.9 5.1 5.2*  CL 101 101 98 101 97*  CO2 '23 24 23 25 25  '$ GLUCOSE 250* 214* 236* 196* 281*  BUN 43* 40* 42* 40* 45*  CREATININE 3.85* 4.01* 3.91* 3.75* 3.82*  CALCIUM 8.7* 8.8* 8.9 9.1 9.1  MG 2.6* 2.7*  --  2.6*  --   PHOS 3.0 3.8  --  4.2  --    GFR: Estimated Creatinine Clearance: 13 mL/min (A) (by C-G formula based on SCr of 3.82 mg/dL (H)). Liver Function Tests: Recent Labs  Lab 01/09/21 1715  AST 55*  ALT 20  ALKPHOS 31*  BILITOT 0.6  PROT 7.2  ALBUMIN 3.3*   No results for input(s): LIPASE, AMYLASE in the last 168  hours. No results for input(s): AMMONIA in the last 168 hours. Coagulation Profile: Recent Labs  Lab 01/09/21 1113  INR 1.0   Cardiac Enzymes: No results for input(s): CKTOTAL, CKMB, CKMBINDEX, TROPONINI in the last 168 hours. BNP (last 3 results) No results for input(s): PROBNP in the last 8760 hours. HbA1C: No results for input(s): HGBA1C in the last 72 hours. CBG: Recent Labs  Lab 01/14/21 0819 01/14/21 1245 01/14/21 1659 01/14/21 2215 01/15/21 0805  GLUCAP 207* 280* 216* 356* 342*   Lipid Profile: No results for input(s): CHOL, HDL, LDLCALC, TRIG, CHOLHDL, LDLDIRECT in the last 72 hours. Thyroid Function Tests: No results for input(s): TSH, T4TOTAL, FREET4, T3FREE, THYROIDAB in the last 72 hours. Anemia Panel: No results for input(s): VITAMINB12, FOLATE, FERRITIN, TIBC, IRON, RETICCTPCT in the last 72 hours. Sepsis Labs: No  results for input(s): PROCALCITON, LATICACIDVEN in the last 168 hours.  Recent Results (from the past 240 hour(s))  Resp Panel by RT-PCR (Flu A&B, Covid) Nasopharyngeal Swab     Status: None   Collection Time: 01/09/21  9:55 AM   Specimen: Nasopharyngeal Swab; Nasopharyngeal(NP) swabs in vial transport medium  Result Value Ref Range Status   SARS Coronavirus 2 by RT PCR NEGATIVE NEGATIVE Final    Comment: (NOTE) SARS-CoV-2 target nucleic acids are NOT DETECTED.  The SARS-CoV-2 RNA is generally detectable in upper respiratory specimens during the acute phase of infection. The lowest concentration of SARS-CoV-2 viral copies this assay can detect is 138 copies/mL. A negative result does not preclude SARS-Cov-2 infection and should not be used as the sole basis for treatment or other patient management decisions. A negative result may occur with  improper specimen collection/handling, submission of specimen other than nasopharyngeal swab, presence of viral mutation(s) within the areas targeted by this assay, and inadequate number of viral copies(<138 copies/mL). A negative result must be combined with clinical observations, patient history, and epidemiological information. The expected result is Negative.  Fact Sheet for Patients:  EntrepreneurPulse.com.au  Fact Sheet for Healthcare Providers:  IncredibleEmployment.be  This test is no t yet approved or cleared by the [place] FDA and  has been authorized for detection and/or diagnosis of SARS-CoV-2 by FDA under an Emergency Use Authorization (EUA). This EUA will remain  in effect (meaning this test can be used) for the duration of the COVID-19 declaration under Section 564(b)(1) of the Act, 21 U.S.C.section 360bbb-3(b)(1), unless the authorization is terminated  or revoked sooner.       Influenza A by PCR NEGATIVE NEGATIVE Final   Influenza B by PCR NEGATIVE NEGATIVE Final    Comment:  (NOTE) The Xpert Xpress SARS-CoV-2/FLU/RSV plus assay is intended as an aid in the diagnosis of influenza from Nasopharyngeal swab specimens and should not be used as a sole basis for treatment. Nasal washings and aspirates are unacceptable for Xpert Xpress SARS-CoV-2/FLU/RSV testing.  Fact Sheet for Patients: EntrepreneurPulse.com.au  Fact Sheet for Healthcare Providers: IncredibleEmployment.be  This test is not yet approved or cleared by the [place] FDA and has been authorized for detection and/or diagnosis of SARS-CoV-2 by FDA under an Emergency Use Authorization (EUA). This EUA will remain in effect (meaning this test can be used) for the duration of the COVID-19 declaration under Section 564(b)(1) of the Act, 21 U.S.C. section 360bbb-3(b)(1), unless the authorization is terminated or revoked.  Performed at [facility], [address] [place], [place] [address]          Radiology Studies: No results found.  Scheduled Meds: . amLODipine  10 mg Oral Daily  . aspirin EC  81 mg Oral Daily  . atorvastatin  40 mg Oral Daily  . calcitRIOL  0.25 mcg Oral Daily  . epoetin (EPOGEN/PROCRIT) injection  20,000 Units Subcutaneous Q T,Th,Sa-HD  . heparin injection (subcutaneous)  5,000 Units Subcutaneous Q8H  . hydrALAZINE  25 mg Oral BID  . insulin aspart  0-5 Units Subcutaneous QHS  . insulin aspart  0-9 Units Subcutaneous TID WC  . insulin glargine  22 Units Subcutaneous QHS  . isosorbide mononitrate  120 mg Oral Daily  . loratadine  10 mg Oral Daily  . metoprolol succinate  100 mg Oral Daily  . multivitamin with minerals  1 tablet Oral Daily  . pantoprazole  40 mg Oral Daily  . ranolazine  1,000 mg Oral BID  . sodium polystyrene  15 g Oral Once   Continuous Infusions:  Assessment & Plan:   Principal Problem:   NSTEMI (non-ST elevated myocardial infarction) ([place]) Active Problems:   Benign essential HTN    Type II diabetes mellitus with renal manifestations (HCC)   Hyperlipidemia   CAD (coronary artery disease)   Anemia in chronic kidney disease   Acute renal failure superimposed on stage 4 chronic kidney disease (HCC)   Chronic diastolic CHF (congestive heart failure) (HCC)   NSTEMI (non-ST elevated myocardial infarction)and hx of CAD: trop 186 -->1020.  Started on heparin gtt..>now d/'cd after 48hrs RF modification Echo nml ef , diastolic dysf. Stage I 1/20- asx today. No plans for cath due to worsening renal function  VQ scan negative for PE  Cardiology recommends medical management  Will continue Ranexa, Imdur, beta-blockers, statin and aspirin  Okay to DC from cardiac standpoint  Up with Dr. Clayborn Bigness within 1 week of discharge    Benign essential HTN Stable Continue metoprolol, amlodipine, hydralazine Cozaar held due to worsening renal function   Type II diabetes mellitus with renal manifestations [facility]): Recent A1c 10.5, poorly controlled. Patient is taking metformin, Jardiance, glipizide and Lantus. Hold p.o. home diabetic medications. 1/20-BG elevated.  Will increase Lantus to 22 units QD RISS  Hyperkalemia- mildly elevated, K 5.2 Give kayexalate 15gm po x1 Place on low potassium diet   Hyperlipidemia Continue with statin   Acute renal failure superimposed on stage 4 chronic kidney disease ([place]): Baseline creatinine 2.83 on 10/14/2020. Her creatinine on admission 4.33, BUN 50. Nephrology was consulted recommended there is no need for urgent hemodialysis at this time.  1/20-renal fxn up mildly 3.82 Neurology following, no need for HD immediately, might need in relative near future Continue Epogen, Calcitriol Hold nephrotoxic meds    Anemia in chronic kidney disease:Hgb stable, 10.1 Hemoglobin dropped from 8.4-7.9,  monitor H&H Transfuse PRBC if hemoglobin below 7.    Chronic diastolic CHF (congestive heart failure) ([place]):  2D echo 03/19/2020  showed EF of 60 to 65% with grade 1 diastolic dysfunction.  Patient has trace leg edema, BNP 176, chest x-ray showed interstitial edema.  Given 1 dose of Lasix 40 mg. Lasix on hold. -Continue BiPAP prn   DVT prophylaxis: heparin  Code Status:full code Family Communication: none at bedside Disposition Plan:  Status is: Inpatient  Remains inpatient appropriate because:Inpatient level of care appropriate due to severity of illness   Dispo: The patient is from: Home              Anticipated d/c is [id number] with [facility]  Anticipated d/c date is: 2 days              Patient currently is not medically stable to d/c. Marland Kitchenrenal function worse , k up, need to monitor renal function /electrolyte today, if stable in am, and nephrology clears, will dc in am            LOS: 6 days   Time spent: 45 min with >50% on coc    Nolberto Hanlon, MD [facility] Pager [phone]  If 7PM-7AM, please contact night-coverage 01/15/2021, 8:15 AM

## 2021-01-15 NOTE — Progress Notes (Signed)
Mobility Specialist - Progress Note   01/15/21 1459  Mobility  Activity Refused mobility  Mobility performed by Mobility specialist    Pt politely declined session at this time. States "I'm fixing to take a nap". Will hold off and re-attempt at a later date/time.    Mylin Gignac Mobility Specialist  01/15/21, 3:02 PM

## 2021-01-16 LAB — BASIC METABOLIC PANEL
Anion gap: 10 (ref 5–15)
BUN: 46 mg/dL — ABNORMAL HIGH (ref 8–23)
CO2: 23 mmol/L (ref 22–32)
Calcium: 9 mg/dL (ref 8.9–10.3)
Chloride: 99 mmol/L (ref 98–111)
Creatinine, Ser: 3.58 mg/dL — ABNORMAL HIGH (ref 0.44–1.00)
GFR, Estimated: 12 mL/min — ABNORMAL LOW (ref >60)
Glucose, Bld: 286 mg/dL — ABNORMAL HIGH (ref 70–99)
Potassium: 4.7 mmol/L (ref 3.5–5.1)
Sodium: 132 mmol/L — ABNORMAL LOW (ref 135–145)

## 2021-01-16 LAB — GLUCOSE, CAPILLARY
Glucose-Capillary: 251 mg/dL — ABNORMAL HIGH (ref 70–99)
Glucose-Capillary: 264 mg/dL — ABNORMAL HIGH (ref 70–99)

## 2021-01-16 MED ORDER — LANTUS SOLOSTAR 100 UNIT/ML ~~LOC~~ SOPN: 22.0000 [IU] | PEN_INJECTOR | Freq: Every day | SUBCUTANEOUS | 1 refills | Status: AC

## 2021-01-16 MED ORDER — RANOLAZINE ER 1000 MG PO TB12: 1000.0000 mg | ORAL_TABLET | Freq: Two times a day (BID) | ORAL | 0 refills | Status: AC

## 2021-01-16 MED ORDER — GLIPIZIDE ER 5 MG PO TB24: 5.0000 mg | ORAL_TABLET | Freq: Every day | ORAL | 0 refills | Status: AC

## 2021-01-16 MED ORDER — ISOSORBIDE MONONITRATE ER 120 MG PO TB24: 120.0000 mg | ORAL_TABLET | Freq: Every day | ORAL | 0 refills | Status: AC

## 2021-01-16 NOTE — Discharge Summary (Signed)
Brittney [name] [id number] DOB: 03-Nov-1941 DOA: 01/09/2021  PCP: Elisabeth Cara, NP  Admit date: 01/09/2021 Discharge date: 01/16/2021  Admitted From: home Disposition:  home  Recommendations for Outpatient Follow-up:  1. Follow up with PCP in 1 week 2. Please obtain BMP/CBC in one week 3. Follow up with cardiology in one week, Dr. Clayborn Bigness 4. F/u with nephrology Dr. Juleen China in one week  Home Health:PT/OT    Discharge Condition:Stable CODE STATUS:Full  Diet recommendation: carb control, low potassium diet (discussed this with her grandaugther.  Brief/Interim Summary: Brittney [name] is a 80 y.o. female with medical history significant of hypertension, hyperlipidemia, diabetes mellitus, GERD, CKD stage IV, dCHF, aortic valve stenosis, CAD, who presents with chest pain shortness of breath.pt was found to have WBC 9.5, troponin level 186, 1020, BNP 176,  V/Q scan negative for PE, negative COVID PCR, worsening renal function with creatinine 4.33, BUN 50 .  Patient was found with NSTEMI.  Cardiology was consulted she was also found with acute renal failure superimposed on stage IV chronic kidney disease and nephrology was consulted.  NSTEMI (non-ST elevated myocardial infarction)and hx of CAD: Treated with heparin gtt. RF modification Echo nml ef , diastolic dysf. Stage I Cardiology was consulted.No plans for cath due to worsening renal function  VQ scan negative for PE  Cardiology recommends medical management  continue Ranexa, Imdur, beta-blockers, statin and aspirin  Okay to DC from cardiac standpoint  Up with Dr. Clayborn Bigness within 1 week of discharge    Benign essential HTN Stable Continue metoprolol, amlodipine, hydralazine ARB's discontinued due to worsening renal function /K elevation   Type II diabetes mellitus with renal manifestations [facility]): Recent A1c 10.5, poorly controlled.  Needs to f/u with pcp for further management Carb control  diet   Hyperkalemia- mildly elevated, K 5.2 Treated with  kayexalate , now 4.7 Follow  low potassium diet- discussed this with the patient and her granddaughter.   Hyperlipidemia Continue with statin   Acute renal failure superimposed on stage 4 chronic kidney disease ([place]): Baseline creatinine 2.83 on 10/14/2020. Hercreatinine on admission 4.33, BUN 50. Nephrology was consulted recommended there is no need for urgent hemodialysis at this time, might need in relative near future continue Calcitriol avoid nephrotoxic meds F/u with nephrology in one week    Anemia in chronic kidney disease: Received Epogen  Needs to f/u with nephrology and pcp for further management   Chronic diastolic CHF (congestive heart failure) ([place]):  2D echo 03/19/2020 showed EF of 60 to65% with grade 1 diastolic dysfunction.  Patient has trace leg edema, BNP 176, chest x-ray showed interstitial edema. Was given lasix then discontinued. Currently euvolemic    Discharge Diagnoses:  Principal Problem:   NSTEMI (non-ST elevated myocardial infarction) ([place]) Active Problems:   Benign essential HTN   Type II diabetes mellitus with renal manifestations (HCC)   Hyperlipidemia   CAD (coronary artery disease)   Anemia in chronic kidney disease   Acute renal failure superimposed on stage 4 chronic kidney disease (HCC)   Chronic diastolic CHF (congestive heart failure) [facility])    Discharge Instructions  Discharge Instructions    Call MD for:  difficulty breathing, headache or visual disturbances   Complete by: As directed    Diet - low sodium heart healthy   Complete by: As directed    Diet Carb Modified   Complete by: As directed    Discharge instructions   Complete by: As directed    Avoid high potassium diet, potatoes,  sweet potatoes, and bananas.   Increase activity slowly   Complete by: As directed      Allergies as of 01/16/2021      Reactions   Accupril [quinapril Hcl]  Other (See Comments)   Reaction: unknown      Medication List    STOP taking these medications   Choline Fenofibrate 45 MG capsule   clopidogrel 75 MG tablet Commonly known as: PLAVIX   losartan 50 MG tablet Commonly known as: COZAAR   metFORMIN 1000 MG tablet Commonly known as: GLUCOPHAGE   pantoprazole 40 MG tablet Commonly known as: PROTONIX   rosuvastatin 40 MG tablet Commonly known as: CRESTOR   saccharomyces boulardii 250 MG capsule Commonly known as: FLORASTOR   sitaGLIPtin 100 MG tablet Commonly known as: JANUVIA   spironolactone 25 MG tablet Commonly known as: ALDACTONE   Stiolto Respimat 2.5-2.5 MCG/ACT Aers Generic drug: Tiotropium Bromide-Olodaterol   torsemide 20 MG tablet Commonly known as: DEMADEX     TAKE these medications   albuterol 108 (90 Base) MCG/ACT inhaler Commonly known as: VENTOLIN HFA Inhale 2 puffs into the lungs every 6 (six) hours as needed for wheezing.   amLODipine 10 MG tablet Commonly known as: NORVASC Take 1 tablet by mouth daily.   aspirin 81 MG EC tablet Take 1 tablet (81 mg total) by mouth daily.   atorvastatin 40 MG tablet Commonly known as: LIPITOR Take 40 mg by mouth daily.   calcitRIOL 0.25 MCG capsule Commonly known as: ROCALTROL Take 0.25 mcg by mouth daily.   cetirizine 10 MG tablet Commonly known as: ZYRTEC Take 10 mg by mouth every other day.   glipiZIDE 5 MG 24 hr tablet Commonly known as: GLUCOTROL XL Take 1 tablet (5 mg total) by mouth daily with breakfast. What changed:   medication strength  how much to take  when to take this   hydrALAZINE 25 MG tablet Commonly known as: APRESOLINE Take 25 mg by mouth 2 (two) times daily.   isosorbide mononitrate 120 MG 24 hr tablet Commonly known as: IMDUR Take 1 tablet (120 mg total) by mouth daily. Start taking on: January 17, 2021 What changed:   medication strength  how much to take   Lantus SoloStar 100 UNIT/ML Solostar Pen Generic  drug: insulin glargine Inject 22 Units into the skin at bedtime. What changed: how much to take   metoprolol succinate 100 MG 24 hr tablet Commonly known as: TOPROL-XL Take 1 tablet (100 mg total) by mouth daily. Take with or immediately following a meal.   multivitamin with minerals Tabs tablet Take 1 tablet by mouth daily.   nitroGLYCERIN 0.4 MG SL tablet Commonly known as: NITROSTAT Place 1 tablet (0.4 mg total) under the tongue every 5 (five) minutes as needed for chest pain.   ranolazine 1000 MG SR tablet Commonly known as: RANEXA Take 1 tablet (1,000 mg total) by mouth 2 (two) times daily. What changed:   medication strength  how much to take  when to take this       Follow-up Information    Lavonia Dana, MD.   Specialty: Nephrology Why: Patient will need to make a follow appointment. Contact information: [place] [address] [phone]        Yolonda Kida, MD In 1 week.   Specialties: Cardiology, Internal Medicine Why: Patient will need to make a follow up appointment. Contact information: [place] [address] [phone]  Elisabeth Cara, NP Follow up in 1 week(s).   Specialty: Nurse Practitioner Contact information: [place] [address] [phone]              Allergies  Allergen Reactions   Accupril [Quinapril Hcl] Other (See Comments)    Reaction: unknown    Consultations:  Cardiology , nephrology   Procedures/Studies: DG Chest 2 View  Result Date: 01/09/2021 CLINICAL DATA:  80 year old female with shortness of breath. EXAM: CHEST - 2 VIEW COMPARISON:  Radiographs 09/20/2020 and earlier. FINDINGS: Calcified aortic atherosclerosis. Cardiac size is at the upper limits of normal. Other mediastinal contours are within normal limits. Visualized tracheal air column is within normal limits. Chronic but diffusely increased pulmonary  interstitium when compared to 09/20/2020 PA and lateral study. No pneumothorax, layering pleural effusion or consolidation. There does appear to be trace pleural fluid in the fissures now. Osteopenia. No acute osseous abnormality identified. Negative visible bowel gas pattern. IMPRESSION: 1. Acute on chronic interstitial opacity with suspected trace fluid in the fissures. Favor acute interstitial edema over viral/atypical respiratory infection. 2.  Aortic Atherosclerosis (ICD10-I70.0). Electronically Signed   By: Genevie Ann M.D.   On: 01/09/2021 08:39   NM Pulmonary Perfusion  Result Date: 01/09/2021 CLINICAL DATA:  Chest pressure and shortness of breath today. EXAM: NUCLEAR MEDICINE PERFUSION LUNG SCAN TECHNIQUE: Perfusion images were obtained in multiple projections after intravenous injection of radiopharmaceutical. Ventilation scans intentionally deferred if perfusion scan and chest x-ray adequate for interpretation during COVID 19 epidemic. RADIOPHARMACEUTICALS:  4.13 mCi Tc-17mMAA IV COMPARISON:  PA and lateral chest today. FINDINGS: Perfusion appears normal without segmental or subsegmental defect. IMPRESSION: Negative for PE. Electronically Signed   By: TInge RiseM.D.   On: 01/09/2021 13:32   DG Chest Portable 1 View  Result Date: 01/09/2021 CLINICAL DATA:  Hypoxemia EXAM: PORTABLE CHEST 1 VIEW COMPARISON:  Portable exam 1645 hours compared to 0816 hours FINDINGS: Minimal enlargement of cardiac silhouette. Atherosclerotic calcification aorta. Diffuse interstitial infiltrates throughout both lungs increased from earlier study of same date, favor pulmonary edema due to rapidity of change and distribution. Infection is not completely excluded but is felt to be less likely. No pleural effusion or pneumothorax. IMPRESSION: Significant increase in diffuse interstitial infiltrates bilaterally since earlier exam of 01/09/2021 favoring pulmonary edema. Electronically Signed   By: MLavonia DanaM.D.   On:  01/09/2021 16:57   ECHOCARDIOGRAM COMPLETE  Result Date: 01/09/2021    ECHOCARDIOGRAM REPORT   Patient Name:   Brittney SARDUYDate of Exam: 01/09/2021 Medical Rec #:  [id number]       Height:       66.0 in Accession #:    [id number]      Weight:       187.3 lb Date of Birth:  107-22-1942      BSA:          1.945 m Patient Age:    89years         BP:           131/98 mmHg Patient Gender: F                HR:           92 bpm. Exam Location:  [facility] Procedure: 2D Echo, Color Doppler, Cardiac Doppler and Intracardiac            Opacification Agent Indications:     I21.4 NSTEMI  History:  Patient has prior history of Echocardiogram examinations, most                  recent 03/19/2020. CKD; Risk Factors:Hypertension, Diabetes and                  Dyslipidemia.  Sonographer:     Charmayne Sheer RDCS (AE) Referring Phys:  [id number] Soledad Gerlach NIU Diagnosing Phys: Bartholome Bill MD IMPRESSIONS  1. Left ventricular ejection fraction, by estimation, is 60 to 65%. The left ventricle has normal function. The left ventricle has no regional wall motion abnormalities. There is mild left ventricular hypertrophy. Left ventricular diastolic parameters are consistent with Grade I diastolic dysfunction (impaired relaxation).  2. Right ventricular systolic function is normal. The right ventricular size is normal.  3. The mitral valve is grossly normal. Mild mitral valve regurgitation.  4. The aortic valve is grossly normal. Aortic valve regurgitation is trivial. FINDINGS  Left Ventricle: Left ventricular ejection fraction, by estimation, is 60 to 65%. The left ventricle has normal function. The left ventricle has no regional wall motion abnormalities. Definity contrast agent was given IV to delineate the left ventricular  endocardial borders. The left ventricular internal cavity size was normal in size. There is mild left ventricular hypertrophy. Left ventricular diastolic parameters are consistent with Grade I diastolic dysfunction  (impaired relaxation). Right Ventricle: The right ventricular size is normal. No increase in right ventricular wall thickness. Right ventricular systolic function is normal. Left Atrium: Left atrial size was normal in size. Right Atrium: Right atrial size was normal in size. Pericardium: There is no evidence of pericardial effusion. Mitral Valve: The mitral valve is grossly normal. Mild mitral valve regurgitation. MV peak gradient, 10.5 mmHg. The mean mitral valve gradient is 4.0 mmHg. Tricuspid Valve: The tricuspid valve is grossly normal. Tricuspid valve regurgitation is trivial. Aortic Valve: The aortic valve is grossly normal. Aortic valve regurgitation is trivial. Aortic valve mean gradient measures 4.0 mmHg. Aortic valve peak gradient measures 6.9 mmHg. Aortic valve area, by VTI measures 2.57 cm. Pulmonic Valve: The pulmonic valve was not well visualized. Pulmonic valve regurgitation is trivial. Aorta: The aortic root is normal in size and structure. IAS/Shunts: The interatrial septum was not well visualized.  LEFT VENTRICLE PLAX 2D LVIDd:         4.93 cm     Diastology LVIDs:         3.69 cm     LV e' medial:    4.68 cm/s LV PW:         1.00 cm     LV E/e' medial:  25.6 LV IVS:        0.78 cm     LV e' lateral:   9.14 cm/s LVOT diam:     2.10 cm     LV E/e' lateral: 13.1 LV SV:         63 LV SV Index:   33 LVOT Area:     3.46 cm  LV Volumes (MOD) LV vol d, MOD A2C: 94.0 ml LV vol d, MOD A4C: 71.6 ml LV vol s, MOD A2C: 41.5 ml LV vol s, MOD A4C: 17.2 ml LV SV MOD A2C:     52.5 ml LV SV MOD A4C:     71.6 ml LV SV MOD BP:      58.0 ml RIGHT VENTRICLE RV Basal diam:  2.23 cm LEFT ATRIUM             Index  RIGHT ATRIUM          Index LA diam:        3.50 cm 1.80 cm/m  RA Area:     5.88 cm LA Vol (A2C):   43.8 ml 22.52 ml/m RA Volume:   7.50 ml  3.86 ml/m LA Vol (A4C):   47.7 ml 24.53 ml/m LA Biplane Vol: 46.2 ml 23.76 ml/m  AORTIC VALVE                   PULMONIC VALVE AV Area (Vmax):    2.42 cm     PV Vmax:       1.09 m/s AV Area (Vmean):   2.56 cm    PV Vmean:      69.600 cm/s AV Area (VTI):     2.57 cm    PV VTI:        0.175 m AV Vmax:           131.00 cm/s PV Peak grad:  4.8 mmHg AV Vmean:          87.800 cm/s PV Mean grad:  2.0 mmHg AV VTI:            0.247 m AV Peak Grad:      6.9 mmHg AV Mean Grad:      4.0 mmHg LVOT Vmax:         91.70 cm/s LVOT Vmean:        64.800 cm/s LVOT VTI:          0.183 m LVOT/AV VTI ratio: 0.74  AORTA Ao Root diam: 3.00 cm MITRAL VALVE MV Area (PHT): 5.27 cm     SHUNTS MV Area VTI:   2.28 cm     Systemic VTI:  0.18 m MV Peak grad:  10.5 mmHg    Systemic Diam: 2.10 cm MV Mean grad:  4.0 mmHg MV Vmax:       1.62 m/s MV Vmean:      97.9 cm/s MV Decel Time: 144 msec MV E velocity: 120.00 cm/s MV A velocity: 154.00 cm/s MV E/A ratio:  0.78 Bartholome Bill MD Electronically signed by Bartholome Bill MD Signature Date/Time: 01/09/2021/3:36:24 PM    Final       Subjective: Has no complaints, denies sob, cp. Off 02  Discharge Exam: Vitals:   01/16/21 1021 01/16/21 1147  BP: 132/61 (!) 145/67  Pulse:  81  Resp:  16  Temp:  98.6 F (37 C)  SpO2:  97%   Vitals:   01/16/21 0345 01/16/21 0759 01/16/21 1021 01/16/21 1147  BP: (!) 153/61 (!) 162/67 132/61 (!) 145/67  Pulse: 89 86  81  Resp: '19 20  16  '$ Temp: 98.1 F (36.7 C) 98.2 F (36.8 C)  98.6 F (37 C)  TempSrc:  Oral  Oral  SpO2: 96% 96%  97%  Weight: 85.3 kg     Height:        General: Pt is alert, awake, not in acute distress Cardiovascular: RRR, S1/S2 +, no rubs, no gallops Respiratory: CTA bilaterally, no wheezing, no rhonchi Abdominal: Soft, NT, ND, bowel sounds + Extremities: no edema, no cyanosis    The results of significant diagnostics from this hospitalization (including imaging, microbiology, ancillary and laboratory) are listed below for reference.     Microbiology: Recent Results (from the past 240 hour(s))  Resp Panel by RT-PCR (Flu A&B, Covid) Nasopharyngeal Swab     Status: None    Collection Time: 01/09/21  9:55 AM  Specimen: Nasopharyngeal Swab; Nasopharyngeal(NP) swabs in vial transport medium  Result Value Ref Range Status   SARS Coronavirus 2 by RT PCR NEGATIVE NEGATIVE Final    Comment: (NOTE) SARS-CoV-2 target nucleic acids are NOT DETECTED.  The SARS-CoV-2 RNA is generally detectable in upper respiratory specimens during the acute phase of infection. The lowest concentration of SARS-CoV-2 viral copies this assay can detect is 138 copies/mL. A negative result does not preclude SARS-Cov-2 infection and should not be used as the sole basis for treatment or other patient management decisions. A negative result may occur with  improper specimen collection/handling, submission of specimen other than nasopharyngeal swab, presence of viral mutation(s) within the areas targeted by this assay, and inadequate number of viral copies(<138 copies/mL). A negative result must be combined with clinical observations, patient history, and epidemiological information. The expected result is Negative.  Fact Sheet for Patients:  EntrepreneurPulse.com.au  Fact Sheet for Healthcare Providers:  IncredibleEmployment.be  This test is no t yet approved or cleared by the [place] FDA and  has been authorized for detection and/or diagnosis of SARS-CoV-2 by FDA under an Emergency Use Authorization (EUA). This EUA will remain  in effect (meaning this test can be used) for the duration of the COVID-19 declaration under Section 564(b)(1) of the Act, 21 U.S.C.section 360bbb-3(b)(1), unless the authorization is terminated  or revoked sooner.       Influenza A by PCR NEGATIVE NEGATIVE Final   Influenza B by PCR NEGATIVE NEGATIVE Final    Comment: (NOTE) The Xpert Xpress SARS-CoV-2/FLU/RSV plus assay is intended as an aid in the diagnosis of influenza from Nasopharyngeal swab specimens and should not be used as a sole basis for treatment.  Nasal washings and aspirates are unacceptable for Xpert Xpress SARS-CoV-2/FLU/RSV testing.  Fact Sheet for Patients: EntrepreneurPulse.com.au  Fact Sheet for Healthcare Providers: IncredibleEmployment.be  This test is not yet approved or cleared by the [place] FDA and has been authorized for detection and/or diagnosis of SARS-CoV-2 by FDA under an Emergency Use Authorization (EUA). This EUA will remain in effect (meaning this test can be used) for the duration of the COVID-19 declaration under Section 564(b)(1) of the Act, 21 U.S.C. section 360bbb-3(b)(1), unless the authorization is terminated or revoked.  Performed at [facility], [address] [place], [place] [address]      Labs: BNP (last 3 results) Recent Labs    09/20/20 0403 01/09/21 0813 01/09/21 1715  BNP 147.8* 176.3* [id number]*   Basic Metabolic Panel: Recent Labs  Lab 01/11/21 0424 01/12/21 0402 01/13/21 0454 01/14/21 0429 01/15/21 0456 01/16/21 0540  NA 134* 134* 132* 135 133* 132*  K 4.5 5.1 4.9 5.1 5.2* 4.7  CL 101 101 98 101 97* 99  CO2 '23 24 23 25 25 23  '$ GLUCOSE 250* 214* 236* 196* 281* 286*  BUN 43* 40* 42* 40* 45* 46*  CREATININE 3.85* 4.01* 3.91* 3.75* 3.82* 3.58*  CALCIUM 8.7* 8.8* 8.9 9.1 9.1 9.0  MG 2.6* 2.7*  --  2.6*  --   --   PHOS 3.0 3.8  --  4.2  --   --    Liver Function Tests: Recent Labs  Lab 01/09/21 1715  AST 55*  ALT 20  ALKPHOS 31*  BILITOT 0.6  PROT 7.2  ALBUMIN 3.3*   No results for input(s): LIPASE, AMYLASE in the last 168 hours. No results for input(s): AMMONIA in the last 168 hours. CBC: Recent Labs  Lab 01/10/21 1111 01/11/21 0424 01/11/21 1444  01/12/21 0402 01/13/21 0454 01/14/21 0429  WBC 9.0 12.0*  --  10.9*  --   --   HGB 8.4* 7.9* 8.6* 7.9* 8.1* 8.0*  HCT 27.1* 25.0* 26.6* 25.5* 24.7* 24.6*  MCV 79.7* 80.1  --  80.7  --   --   PLT 517* 456*  --  475*  --   --    Cardiac Enzymes: No results  for input(s): CKTOTAL, CKMB, CKMBINDEX, TROPONINI in the last 168 hours. BNP: Invalid input(s): POCBNP CBG: Recent Labs  Lab 01/15/21 1215 01/15/21 1620 01/15/21 1954 01/16/21 0812 01/16/21 1153  GLUCAP 288* 247* 261* 251* 264*   D-Dimer No results for input(s): DDIMER in the last 72 hours. Hgb A1c No results for input(s): HGBA1C in the last 72 hours. Lipid Profile No results for input(s): CHOL, HDL, LDLCALC, TRIG, CHOLHDL, LDLDIRECT in the last 72 hours. Thyroid function studies No results for input(s): TSH, T4TOTAL, T3FREE, THYROIDAB in the last 72 hours.  Invalid input(s): FREET3 Anemia work up No results for input(s): VITAMINB12, FOLATE, FERRITIN, TIBC, IRON, RETICCTPCT in the last 72 hours. Urinalysis    Component Value Date/Time   COLORURINE YELLOW (A) 03/31/2020 1457   APPEARANCEUR HAZY (A) 03/31/2020 1457   LABSPEC 1.015 03/31/2020 1457   PHURINE 5.0 03/31/2020 1457   GLUCOSEU NEGATIVE 03/31/2020 1457   HGBUR NEGATIVE 03/31/2020 1457   [place] 03/31/2020 [place] 03/31/2020 1457   PROTEINUR 100 (A) 03/31/2020 1457   NITRITE NEGATIVE 03/31/2020 1457   LEUKOCYTESUR SMALL (A) 03/31/2020 1457   Sepsis Labs Invalid input(s): PROCALCITONIN,  WBC,  LACTICIDVEN Microbiology Recent Results (from the past 240 hour(s))  Resp Panel by RT-PCR (Flu A&B, Covid) Nasopharyngeal Swab     Status: None   Collection Time: 01/09/21  9:55 AM   Specimen: Nasopharyngeal Swab; Nasopharyngeal(NP) swabs in vial transport medium  Result Value Ref Range Status   SARS Coronavirus 2 by RT PCR NEGATIVE NEGATIVE Final    Comment: (NOTE) SARS-CoV-2 target nucleic acids are NOT DETECTED.  The SARS-CoV-2 RNA is generally detectable in upper respiratory specimens during the acute phase of infection. The lowest concentration of SARS-CoV-2 viral copies this assay can detect is 138 copies/mL. A negative result does not preclude SARS-Cov-2 infection and should not  be used as the sole basis for treatment or other patient management decisions. A negative result may occur with  improper specimen collection/handling, submission of specimen other than nasopharyngeal swab, presence of viral mutation(s) within the areas targeted by this assay, and inadequate number of viral copies(<138 copies/mL). A negative result must be combined with clinical observations, patient history, and epidemiological information. The expected result is Negative.  Fact Sheet for Patients:  EntrepreneurPulse.com.au  Fact Sheet for Healthcare Providers:  IncredibleEmployment.be  This test is no t yet approved or cleared by the [place] FDA and  has been authorized for detection and/or diagnosis of SARS-CoV-2 by FDA under an Emergency Use Authorization (EUA). This EUA will remain  in effect (meaning this test can be used) for the duration of the COVID-19 declaration under Section 564(b)(1) of the Act, 21 U.S.C.section 360bbb-3(b)(1), unless the authorization is terminated  or revoked sooner.       Influenza A by PCR NEGATIVE NEGATIVE Final   Influenza B by PCR NEGATIVE NEGATIVE Final    Comment: (NOTE) The Xpert Xpress SARS-CoV-2/FLU/RSV plus assay is intended as an aid in the diagnosis of influenza from Nasopharyngeal swab specimens and should not be used as a sole  basis for treatment. Nasal washings and aspirates are unacceptable for Xpert Xpress SARS-CoV-2/FLU/RSV testing.  Fact Sheet for Patients: EntrepreneurPulse.com.au  Fact Sheet for Healthcare Providers: IncredibleEmployment.be  This test is not yet approved or cleared by the [place] FDA and has been authorized for detection and/or diagnosis of SARS-CoV-2 by FDA under an Emergency Use Authorization (EUA). This EUA will remain in effect (meaning this test can be used) for the duration of the COVID-19 declaration under Section  564(b)(1) of the Act, 21 U.S.C. section 360bbb-3(b)(1), unless the authorization is terminated or revoked.  Performed at [facility], [address], [place], [place] [address]      Time coordinating discharge: Over 30 minutes  SIGNED:   Nolberto Hanlon, MD  [facility] 01/16/2021, 11:58 AM Pager   If 7PM-7AM, please contact night-coverage [email or web address] Password [email or web address]

## 2021-01-16 NOTE — Progress Notes (Signed)
[facility], [place] 01/16/21  Subjective:   LOS: 7 01/20 0701 - 01/21 0700 In: 12 [P.O.:580] Out: 670 [Urine:670] The patient's creatinine has come down a bit. Currently creatinine 3.5 with an EGFR of 12. Overall feeling better as compared to admission.  Objective:  Vital signs in last 24 hours:  Temp:  [98.1 F (36.7 C)-98.6 F (37 C)] 98.6 F (37 C) (01/21 1147) Pulse Rate:  [76-89] 81 (01/21 1147) Resp:  [16-20] 16 (01/21 1147) BP: (112-162)/(61-67) 145/67 (01/21 1147) SpO2:  [96 %-97 %] 97 % (01/21 1147) Weight:  [85.3 kg] 85.3 kg (01/21 0345)  Weight change: 2.495 kg Filed Weights   01/14/21 0148 01/15/21 0250 01/16/21 0345  Weight: 81.8 kg 82.8 kg 85.3 kg    Intake/Output:    Intake/Output Summary (Last 24 hours) at 01/16/2021 1435 Last data filed at 01/16/2021 1408 Gross per 24 hour  Intake 700 ml  Output 650 ml  Net 50 ml    Physical Exam: General:  No acute distress  HEENT  normocephalic, atraumatic, oral mucous membranes moist  Pulm/lungs  clear bilateral, normal effort  CVS/Heart  S1-S2, no rubs or gallops  Abdomen:   Soft, nontender, nondistended  Extremities:  Trace peripheral edema  Neurologic:  Awake, alert, oriented  Skin:  No acute rashes or lesions      Basic Metabolic Panel:  Recent Labs  Lab 01/11/21 0424 01/12/21 0402 01/13/21 0454 01/14/21 0429 01/15/21 0456 01/16/21 0540  NA 134* 134* 132* 135 133* 132*  K 4.5 5.1 4.9 5.1 5.2* 4.7  CL 101 101 98 101 97* 99  CO2 _0 GLUCOSE 250* 214* 236* 196* 281* 286*  BUN 43* 40* 42* 40* 45* 46*  CREATININE 3.85* 4.01* 3.91* 3.75* 3.82* 3.58*  CALCIUM 8.7* 8.8* 8.9 9.1 9.1 9.0  MG 2.6* 2.7*  --  2.6*  --   --   PHOS 3.0 3.8  --  4.2  --   --      CBC: Recent Labs  Lab 01/10/21 1111 01/11/21 0424 01/11/21 1444 01/12/21 0402 01/13/21 0454 01/14/21 0429  WBC 9.0 12.0*  --  10.9*  --   --   HGB 8.4* 7.9* 8.6* 7.9* 8.1* 8.0*  HCT  27.1* 25.0* 26.6* 25.5* 24.7* 24.6*  MCV 79.7* 80.1  --  80.7  --   --   PLT 517* 456*  --  475*  --   --       Lab Results  Component Value Date   HEPBSAG NON REACTIVE 02/02/2020   HEPBSAB NON REACTIVE 02/02/2020   HEPBIGM NON REACTIVE 02/02/2020      Microbiology:  Recent Results (from the past 240 hour(s))  Resp Panel by RT-PCR (Flu A&B, Covid) Nasopharyngeal Swab     Status: None   Collection Time: 01/09/21  9:55 AM   Specimen: Nasopharyngeal Swab; Nasopharyngeal(NP) swabs in vial transport medium  Result Value Ref Range Status   SARS Coronavirus 2 by RT PCR NEGATIVE NEGATIVE Final    Comment: (NOTE) SARS-CoV-2 target nucleic acids are NOT DETECTED.  The SARS-CoV-2 RNA is generally detectable in upper respiratory specimens during the acute phase of infection. The lowest concentration of SARS-CoV-2 viral copies this assay can detect is 138 copies/mL. A negative result does not preclude SARS-Cov-2 infection and should not be used as the sole basis for treatment or other patient management decisions. A negative result may occur with  improper specimen collection/handling, submission of specimen  other than nasopharyngeal swab, presence of viral mutation(s) within the areas targeted by this assay, and inadequate number of viral copies(<138 copies/mL). A negative result must be combined with clinical observations, patient history, and epidemiological information. The expected result is Negative.  Fact Sheet for Patients:  EntrepreneurPulse.com.au  Fact Sheet for Healthcare Providers:  IncredibleEmployment.be  This test is no t yet approved or cleared by the [place] FDA and  has been authorized for detection and/or diagnosis of SARS-CoV-2 by FDA under an Emergency Use Authorization (EUA). This EUA will remain  in effect (meaning this test can be used) for the duration of the COVID-19 declaration under Section 564(b)(1) of the Act,  21 U.S.C.section 360bbb-3(b)(1), unless the authorization is terminated  or revoked sooner.       Influenza A by PCR NEGATIVE NEGATIVE Final   Influenza B by PCR NEGATIVE NEGATIVE Final    Comment: (NOTE) The Xpert Xpress SARS-CoV-2/FLU/RSV plus assay is intended as an aid in the diagnosis of influenza from Nasopharyngeal swab specimens and should not be used as a sole basis for treatment. Nasal washings and aspirates are unacceptable for Xpert Xpress SARS-CoV-2/FLU/RSV testing.  Fact Sheet for Patients: EntrepreneurPulse.com.au  Fact Sheet for Healthcare Providers: IncredibleEmployment.be  This test is not yet approved or cleared by the [place] FDA and has been authorized for detection and/or diagnosis of SARS-CoV-2 by FDA under an Emergency Use Authorization (EUA). This EUA will remain in effect (meaning this test can be used) for the duration of the COVID-19 declaration under Section 564(b)(1) of the Act, 21 U.S.C. section 360bbb-3(b)(1), unless the authorization is terminated or revoked.  Performed at [facility], [address] [place], [place] [address]     Coagulation Studies: No results for input(s): LABPROT, INR in the last 72 hours.  Urinalysis: No results for input(s): COLORURINE, LABSPEC, PHURINE, GLUCOSEU, HGBUR, BILIRUBINUR, KETONESUR, PROTEINUR, UROBILINOGEN, NITRITE, LEUKOCYTESUR in the last 72 hours.  Invalid input(s): APPERANCEUR    Imaging: No results found.   Medications:     amLODipine  10 mg Oral Daily   aspirin EC  81 mg Oral Daily   atorvastatin  40 mg Oral Daily   calcitRIOL  0.25 mcg Oral Daily   epoetin (EPOGEN/PROCRIT) injection  20,000 Units Subcutaneous Q T,Th,Sa-HD   heparin injection (subcutaneous)  5,000 Units Subcutaneous Q8H   hydrALAZINE  25 mg Oral BID   insulin aspart  0-5 Units Subcutaneous QHS   insulin aspart  0-9 Units Subcutaneous TID WC   insulin  glargine  22 Units Subcutaneous QHS   isosorbide mononitrate  120 mg Oral Daily   loratadine  10 mg Oral Daily   metoprolol succinate  100 mg Oral Daily   multivitamin with minerals  1 tablet Oral Daily   pantoprazole  40 mg Oral Daily   ranolazine  1,000 mg Oral BID   acetaminophen, albuterol, hydrALAZINE, morphine injection, nitroGLYCERIN, ondansetron (ZOFRAN) IV  Assessment/ Plan:  80 y.o. female with hypertension, systolic congestive heart failure, hyperlipidemia, diabetes mellitus type II insulin dependent, aortic valve stenosis  Admitted on 01/09/2021 for   Principal Problem:   NSTEMI (non-ST elevated myocardial infarction) [facility]) Active Problems:   Benign essential HTN   Type II diabetes mellitus with renal manifestations ([place])   Hyperlipidemia   CAD (coronary artery disease)   Anemia in chronic kidney disease   Acute renal failure superimposed on stage 4 chronic kidney disease (HCC)   Chronic diastolic CHF (congestive heart failure) ([place])   #.  Kidney disease stage V Recent Labs    01/13/21 0454 01/14/21 0429 01/15/21 0456 01/16/21 0540  CREATININE 3.91* 3.75* 3.82* 3.58*  Baseline Creatinine 3.10 / GFR 14-16 on 11/25/2020 Presenting Creatinine 4.33/GFR 10 AKI versus progression of underlying chronic kidney disease  Patient has advanced renal dysfunction with most recent EGFR of 12 with a creatinine of 3.58.  Suspect that she will need dialysis in the relative near future.  I have advised her to follow-up with my partner Dr. Juleen China in the office in the next week or two, patient verbalized understanding.  #. Anemia of CKD Lab Results  Component Value Date   HGB 8.0 (L) 01/14/2021   Patient will likely need ongoing treatment with Epogen as an outpatient.  #.  Secondary hyperparathyroidism     Component Value Date/Time   PTH 35 02/02/2020 0553   Lab Results  Component Value Date   PHOS 4.2 01/14/2021  Continue calcitriol upon discharge.  #.  HTN Maintain the patient on amlodipine, hydralazine, Imdur, metoprolol.    #. Diabetes type 2 with CKD Hgb A1c MFr Bld (%)  Date Value  01/10/2021 8.3 (H)  Management as per hospitalist    LOS: 7 Sole Lengacher 1/21/20222:35 PM  [facility] [place], [place]

## 2021-01-16 NOTE — TOC Initial Note (Signed)
Transition of Care [facility]) - Initial/Assessment Note    Patient Details  Name: Brittney [name] MRN: [id number] Date of Birth: January 06, 1941  Transition of Care [facility]) CM/SW Contact:    Eileen Stanford, LCSW Phone Number: 01/16/2021, 9:39 AM  Clinical Narrative:   CSW working remote and unable to reach pt in the room. CSW spoke with pt's Granddaughter. Per Granddaughter pt lives with daughter and has had [place] [facility] in the past. Pt's Granddaughter states pt used [employer] and they are agreeable to use them again. Pt's Granddaughter states pt has a walker at home. Pt's family will pick pt up at d/c.                 Expected Discharge Plan: [place] Barriers to Discharge: No Barriers Identified   Patient Goals and CMS Choice     Choice offered to / list presented to : Adult Children  Expected Discharge Plan and Services Expected Discharge Plan: [place] Acute Care Choice: [place] arrangements for the past 2 months: Single Family Home                           HH Arranged: PT,OT [place] Agency: [facility] Date [facility] Agency Contacted: 01/16/21 Time [place]: [phone] Representative spoke with at [place]: brittany  Prior Living Arrangements/Services Living arrangements for the past 2 months: [place] with:: Adult Children Patient language and need for interpreter reviewed:: Yes Do you feel safe going back to the place where you live?: Yes      Need for Family Participation in Patient Care: Yes (Comment) Care giver support system in place?: Yes (comment)   Criminal Activity/Legal Involvement Pertinent to Current Situation/Hospitalization: No - Comment as needed  Activities of Daily Living Home Assistive Devices/Equipment: Cane (specify quad or straight) ADL Screening (condition at time of admission) Patient's cognitive ability adequate to safely complete daily activities?: Yes Is the patient deaf or have  difficulty hearing?: No Does the patient have difficulty seeing, even when wearing glasses/contacts?: No Does the patient have difficulty concentrating, remembering, or making decisions?: No Patient able to express need for assistance with ADLs?: Yes Does the patient have difficulty dressing or bathing?: No Independently performs ADLs?: Yes (appropriate for developmental age) Does the patient have difficulty walking or climbing stairs?: Yes Weakness of Legs: Both Weakness of Arms/Hands: None  Permission Sought/Granted Permission sought to share information with : Family Supports    Share Information with NAME: AMelia  Permission granted to share info w AGENCY: [facility]  Permission granted to share info w Relationship: granddaughter     Emotional Assessment Appearance:: Appears stated age     Orientation: : Oriented to Self,Oriented to Place,Oriented to  Time,Oriented to Situation Alcohol / Substance Use: Not Applicable Psych Involvement: No (comment)  Admission diagnosis:  Shortness of breath [R06.02] NSTEMI (non-ST elevated myocardial infarction) [facility]) [I21.4] Patient Active Problem List   Diagnosis Date Noted   NSTEMI (non-ST elevated myocardial infarction) ([place]) 01/09/2021   Chronic diastolic CHF (congestive heart failure) ([place]) 01/09/2021   Acute renal failure superimposed on stage 4 chronic kidney disease ([place]) 11/25/2020   Abdominal pain 03/31/2020   Chest pain 03/19/2020   Acute renal failure superimposed on stage 3b chronic kidney disease ([place]) 03/19/2020   CAD (coronary artery disease) [id number]   Chronic systolic CHF (congestive heart failure) ([place]) 03/19/2020   Leukocytosis  03/19/2020   Anemia in chronic kidney disease 02/26/2020   Benign hypertensive kidney disease with chronic kidney disease 02/26/2020   Congestive heart failure ([place]) 02/26/2020   Hypokalemia 02/26/2020   Secondary hyperparathyroidism of renal origin ([place]) 02/26/2020   Stage 3a  chronic kidney disease ([place]) 02/26/2020   Acute exacerbation of CHF (congestive heart failure) ([place]) 02/01/2020   Elevated troponin 02/01/2020   Reflux esophagitis    CLE (columnar lined esophagus)    Duodenitis 06/25/2019   Chronic heart failure with preserved ejection fraction (HFpEF) ([place]) 02/26/2019   Benign essential HTN 01/17/2019   Type II diabetes mellitus with renal manifestations ([place]) 07/24/2015   GERD without esophagitis 07/24/2015   Hyperlipidemia 07/24/2015   PCP:  Elisabeth Cara, NP Pharmacy:   [facility] [place], [address] [place] [address] Phone: [phone] Fax: [place], [place] - [address] [place] [address] Phone: [phone] Fax: [phone]     Social Determinants of Health ([place]) Interventions    Readmission Risk Interventions Readmission Risk Prevention Plan 04/02/2020 08/10/2018  Transportation Screening Complete Complete  PCP or Specialist Appt within 5-7 Days - Complete  PCP or Specialist Appt within 3-5 Days Complete -  Home Care Screening - Complete  Medication Review (RN CM) - Complete  HRI or Home Care Consult Complete -  Medication Review (RN Care Manager) Complete -  Some recent data might be hidden

## 2021-01-16 NOTE — Care Management Important Message (Signed)
Important Message  Patient Details  Name: Brittney [name] MRN: [id number] Date of Birth: 1941/07/27   Medicare Important Message Given:  Yes     Dannette Barbara 01/16/2021, 1:00 PM

## 2021-01-22 ENCOUNTER — Ambulatory Visit: Payer: Medicare HMO | Admitting: Family

## 2021-01-22 ENCOUNTER — Other Ambulatory Visit: Payer: Self-pay

## 2021-01-22 ENCOUNTER — Emergency Department: Payer: Medicare HMO

## 2021-01-22 ENCOUNTER — Emergency Department
Admission: EM | Admit: 2021-01-22 | Discharge: 2021-01-27 | Disposition: E | Payer: Medicare HMO | Attending: Emergency Medicine | Admitting: Emergency Medicine

## 2021-01-22 DIAGNOSIS — J9601 Acute respiratory failure with hypoxia: Secondary | ICD-10-CM | POA: Insufficient documentation

## 2021-01-22 DIAGNOSIS — Z9861 Coronary angioplasty status: Secondary | ICD-10-CM | POA: Diagnosis not present

## 2021-01-22 DIAGNOSIS — E1122 Type 2 diabetes mellitus with diabetic chronic kidney disease: Secondary | ICD-10-CM | POA: Insufficient documentation

## 2021-01-22 DIAGNOSIS — Z87891 Personal history of nicotine dependence: Secondary | ICD-10-CM | POA: Diagnosis not present

## 2021-01-22 DIAGNOSIS — I5032 Chronic diastolic (congestive) heart failure: Secondary | ICD-10-CM | POA: Diagnosis not present

## 2021-01-22 DIAGNOSIS — Z794 Long term (current) use of insulin: Secondary | ICD-10-CM | POA: Diagnosis not present

## 2021-01-22 DIAGNOSIS — J9602 Acute respiratory failure with hypercapnia: Secondary | ICD-10-CM | POA: Diagnosis not present

## 2021-01-22 DIAGNOSIS — I2510 Atherosclerotic heart disease of native coronary artery without angina pectoris: Secondary | ICD-10-CM | POA: Diagnosis not present

## 2021-01-22 DIAGNOSIS — I130 Hypertensive heart and chronic kidney disease with heart failure and stage 1 through stage 4 chronic kidney disease, or unspecified chronic kidney disease: Secondary | ICD-10-CM | POA: Insufficient documentation

## 2021-01-22 DIAGNOSIS — Z20822 Contact with and (suspected) exposure to covid-19: Secondary | ICD-10-CM | POA: Insufficient documentation

## 2021-01-22 DIAGNOSIS — R0603 Acute respiratory distress: Secondary | ICD-10-CM | POA: Diagnosis present

## 2021-01-22 DIAGNOSIS — N184 Chronic kidney disease, stage 4 (severe): Secondary | ICD-10-CM | POA: Diagnosis not present

## 2021-01-22 DIAGNOSIS — Z7984 Long term (current) use of oral hypoglycemic drugs: Secondary | ICD-10-CM | POA: Insufficient documentation

## 2021-01-22 DIAGNOSIS — I469 Cardiac arrest, cause unspecified: Secondary | ICD-10-CM | POA: Insufficient documentation

## 2021-01-22 DIAGNOSIS — Z7982 Long term (current) use of aspirin: Secondary | ICD-10-CM | POA: Diagnosis not present

## 2021-01-22 DIAGNOSIS — Z79899 Other long term (current) drug therapy: Secondary | ICD-10-CM | POA: Insufficient documentation

## 2021-01-22 LAB — URINALYSIS, COMPLETE (UACMP) WITH MICROSCOPIC
Bacteria, UA: NONE SEEN
Bilirubin Urine: NEGATIVE
Glucose, UA: >=500 mg/dL — AB
Hgb urine dipstick: NEGATIVE
Ketones, ur: NEGATIVE mg/dL
Leukocytes,Ua: NEGATIVE
Nitrite: NEGATIVE
Protein, ur: 100 mg/dL — AB
Specific Gravity, Urine: 1.017 (ref 1.005–1.030)
Squamous Epithelial / HPF: NONE SEEN (ref 0–5)
pH: 5 (ref 5.0–8.0)

## 2021-01-22 LAB — BLOOD GAS, ARTERIAL
Acid-base deficit: 19 mmol/L — ABNORMAL HIGH (ref 0.0–2.0)
Bicarbonate: 12.9 mmol/L — ABNORMAL LOW (ref 20.0–28.0)
FIO2: 1
MECHVT: 500 mL
O2 Saturation: 94.3 %
PEEP: 5 cmH2O
Patient temperature: 37
RATE: 16 resp/min
pCO2 arterial: 66 mmHg (ref 32.0–48.0)
pH, Arterial: 6.9 — CL (ref 7.350–7.450)
pO2, Arterial: 116 mmHg — ABNORMAL HIGH (ref 83.0–108.0)

## 2021-01-22 LAB — CBC WITH DIFFERENTIAL/PLATELET
Abs Immature Granulocytes: 0.46 10*3/uL — ABNORMAL HIGH (ref 0.00–0.07)
Basophils Absolute: 0.1 10*3/uL (ref 0.0–0.1)
Basophils Relative: 1 %
Eosinophils Absolute: 0.2 10*3/uL (ref 0.0–0.5)
Eosinophils Relative: 1 %
HCT: 27.5 % — ABNORMAL LOW (ref 36.0–46.0)
Hemoglobin: 8.2 g/dL — ABNORMAL LOW (ref 12.0–15.0)
Immature Granulocytes: 4 %
Lymphocytes Relative: 50 %
Lymphs Abs: 6.7 10*3/uL — ABNORMAL HIGH (ref 0.7–4.0)
MCH: 25.7 pg — ABNORMAL LOW (ref 26.0–34.0)
MCHC: 29.8 g/dL — ABNORMAL LOW (ref 30.0–36.0)
MCV: 86.2 fL (ref 80.0–100.0)
Monocytes Absolute: 0.6 10*3/uL (ref 0.1–1.0)
Monocytes Relative: 4 %
Neutro Abs: 5.3 10*3/uL (ref 1.7–7.7)
Neutrophils Relative %: 40 %
Platelets: 518 10*3/uL — ABNORMAL HIGH (ref 150–400)
RBC: 3.19 MIL/uL — ABNORMAL LOW (ref 3.87–5.11)
RDW: 16.7 % — ABNORMAL HIGH (ref 11.5–15.5)
WBC: 13.2 10*3/uL — ABNORMAL HIGH (ref 4.0–10.5)
nRBC: 0.2 % (ref 0.0–0.2)

## 2021-01-22 LAB — COMPREHENSIVE METABOLIC PANEL
ALT: 156 U/L — ABNORMAL HIGH (ref 0–44)
AST: 179 U/L — ABNORMAL HIGH (ref 15–41)
Albumin: 2.6 g/dL — ABNORMAL LOW (ref 3.5–5.0)
Alkaline Phosphatase: 45 U/L (ref 38–126)
Anion gap: 17 — ABNORMAL HIGH (ref 5–15)
BUN: 29 mg/dL — ABNORMAL HIGH (ref 8–23)
CO2: 15 mmol/L — ABNORMAL LOW (ref 22–32)
Calcium: 12.6 mg/dL — ABNORMAL HIGH (ref 8.9–10.3)
Chloride: 103 mmol/L (ref 98–111)
Creatinine, Ser: 2.91 mg/dL — ABNORMAL HIGH (ref 0.44–1.00)
GFR, Estimated: 16 mL/min — ABNORMAL LOW (ref >60)
Glucose, Bld: 583 mg/dL (ref 70–99)
Potassium: 5.6 mmol/L — ABNORMAL HIGH (ref 3.5–5.1)
Sodium: 135 mmol/L (ref 135–145)
Total Bilirubin: 0.6 mg/dL (ref 0.3–1.2)
Total Protein: 5.8 g/dL — ABNORMAL LOW (ref 6.5–8.1)

## 2021-01-22 LAB — CBG MONITORING, ED: Glucose-Capillary: 300 mg/dL — ABNORMAL HIGH (ref 70–99)

## 2021-01-22 LAB — LACTIC ACID, PLASMA: Lactic Acid, Venous: >11 mmol/L (ref 0.5–1.9)

## 2021-01-22 LAB — SARS CORONAVIRUS 2 BY RT PCR (HOSPITAL ORDER, PERFORMED IN ~~LOC~~ HOSPITAL LAB): SARS Coronavirus 2: NEGATIVE

## 2021-01-22 LAB — MAGNESIUM: Magnesium: 2.9 mg/dL — ABNORMAL HIGH (ref 1.7–2.4)

## 2021-01-22 LAB — TROPONIN I (HIGH SENSITIVITY): Troponin I (High Sensitivity): 63 ng/L — ABNORMAL HIGH (ref <18)

## 2021-01-22 LAB — BRAIN NATRIURETIC PEPTIDE: B Natriuretic Peptide: 666.7 pg/mL — ABNORMAL HIGH (ref 0.0–100.0)

## 2021-01-22 MED ORDER — EPINEPHRINE HCL 5 MG/250ML IV SOLN IN NS
0.5000 ug/min | INTRAVENOUS | Status: DC
  Administered 2021-01-22: 0.5 ug/min via INTRAVENOUS
  Filled 2021-01-22: qty 250

## 2021-01-22 MED ORDER — CALCIUM CHLORIDE 10 % IV SOLN
INTRAVENOUS | Status: AC | PRN
  Administered 2021-01-22: 1 g via INTRAVENOUS

## 2021-01-22 MED ORDER — SODIUM BICARBONATE 8.4 % IV SOLN
INTRAVENOUS | Status: AC | PRN
  Administered 2021-01-22 (×2): 100 meq via INTRAVENOUS

## 2021-01-22 MED ORDER — SODIUM BICARBONATE 8.4 % IV SOLN
150.0000 meq | Freq: Once | INTRAVENOUS | Status: AC
  Administered 2021-01-22: 150 meq via INTRAVENOUS

## 2021-01-22 MED ORDER — EPINEPHRINE 1 MG/10ML IJ SOSY
PREFILLED_SYRINGE | INTRAMUSCULAR | Status: AC | PRN
  Administered 2021-01-22: 1 mg via INTRAVENOUS

## 2021-01-22 MED ORDER — ATROPINE SULFATE 1 MG/ML IJ SOLN
INTRAMUSCULAR | Status: AC | PRN
  Administered 2021-01-22: 1 mg via INTRAVENOUS

## 2021-01-22 MED ORDER — EPINEPHRINE HCL 5 MG/250ML IV SOLN IN NS
0.5000 ug/min | INTRAVENOUS | Status: DC
Start: 2021-01-22 — End: 2021-01-22

## 2021-01-22 MED ORDER — STERILE WATER FOR INJECTION IV SOLN
INTRAVENOUS | Status: DC
  Filled 2021-01-22 (×3): qty 850

## 2021-01-22 MED ORDER — EPINEPHRINE 1 MG/10ML IJ SOSY
PREFILLED_SYRINGE | INTRAMUSCULAR | Status: AC | PRN
Start: 2021-01-22 — End: 2021-01-22
  Administered 2021-01-22 (×4): 1 mg via INTRAVENOUS

## 2021-01-27 NOTE — ED Notes (Signed)
Time of death 69 called by MD Jari Pigg

## 2021-01-27 NOTE — Code Documentation (Signed)
Pulse check asystole on monitor

## 2021-01-27 NOTE — ED Triage Notes (Signed)
Pt to ED via EMS from home. Per ems they were called out for respiratory distress, pt became asystole on the monitor with EMS. CPR in progress for 69mn pta. Pt was given 3 epi ans 1l ns

## 2021-01-27 NOTE — Code Documentation (Signed)
Pulse check- pea

## 2021-01-27 NOTE — ED Provider Notes (Signed)
   [facility]   PROCEDURES  Procedure(s) performed (including Critical Care):  .Critical Care Performed by: Vanessa Zillah, MD Authorized by: Vanessa [place], MD   Critical care provider statement:    Critical care time (minutes):  31   Critical care was necessary to treat or prevent imminent or life-threatening deterioration of the following conditions:  Cardiac failure   Critical care was time spent personally by me on the following activities:  Discussions with consultants, evaluation of patient's response to treatment, examination of patient, ordering and performing treatments and interventions, ordering and review of laboratory studies, ordering and review of radiographic studies, pulse oximetry, re-evaluation of patient's condition, obtaining history from patient or surrogate and review of old charts   Patient status post cardiac arrest.  Since off going team life patient has recoded 3 additional times.  We will get pulses back for a few minutes but then will lose them again.  We have given her multiple doses of epi, bicarb.  Pulse checks were nonshockable rhythms including mostly PEA.  Her labs show significant lactic acidosis with a pH less than 6.9.  Patient's had greater than 1 hour of cardiac arrest.  I discussed with family multiple siblings and including the eldest daughter Cassandria Santee who is the medical power of attorney attorney due to her being the oldest as well as 2 additional sisters as well as the granddaughter.  We had a discussion about continuing resuscitation versus DNR due to the poor prognosis.  At this time they have voiced that their mother would not want to be kept alive if there was low chance for meaningful recovery.  I explained my concerns to family given the greater than 1 hour of cardiac arrest and multiple times losing pulses even with medications.  They have elected to make patient DNR and not to continue resuscitation.  At 805 I did pulse check  and there was no pulse palpated patient was in PEA with a rate of 20.  Her pupils were fixed and dilated.  The ventilator was turned off.  Discussed with dr Sherril Cong  Patient's PCP.  She will fill out the death certificate     ___________   Vanessa [place], MD 26-Jan-2021 [phone]

## 2021-01-27 NOTE — Code Documentation (Signed)
Pulse check, pea/asystole

## 2021-01-27 NOTE — ED Notes (Signed)
$'1mg'V$  epi given at [phone]

## 2021-01-27 NOTE — Code Documentation (Signed)
Pt brady to 39, no pulse present. PEA on monitor, cpr resumed

## 2021-01-27 NOTE — ED Provider Notes (Addendum)
[facility] Emergency Department Provider Note  ____________________________________________   Event Date/Time   First MD Initiated Contact with Patient 01-28-2021 0715     (approximate)  I have reviewed the triage vital signs and the nursing notes.   HISTORY  Chief Complaint CPR in progress    HPI Brittney [name] is a 80 y.o. female with history of hypertension, hyperlipidemia, CKD, diabetes, CHF who presents to the emergency department with EMS with respiratory distress.  EMS reports as they were getting her out of the house she arrested.  With EMS patient was in asystole.  She received 3 epinephrines.  A King airway was placed by EMS.  CPR has been on going for more than 20 minutes.  No shockable rhythms.        Past Medical History:  Diagnosis Date  . Aortic valve stenosis   . Carotid bruit   . CHF (congestive heart failure) ([place])   . Chronic kidney disease   . Diabetes mellitus without complication ([place])   . GERD (gastroesophageal reflux disease)   . Heart murmur   . Hyperlipidemia   . Hypertension   . Pneumonia   . Vitamin D deficiency     Patient Active Problem List   Diagnosis Date Noted  . NSTEMI (non-ST elevated myocardial infarction) ([place]) 01/09/2021  . Chronic diastolic CHF (congestive heart failure) ([place]) 01/09/2021  . Acute renal failure superimposed on stage 4 chronic kidney disease ([place]) 11/25/2020  . Abdominal pain 03/31/2020  . Chest pain 03/19/2020  . Acute renal failure superimposed on stage 3b chronic kidney disease ([place]) 03/19/2020  . CAD (coronary artery disease) 03/19/2020  . Chronic systolic CHF (congestive heart failure) ([place]) 03/19/2020  . Leukocytosis 03/19/2020  . Anemia in chronic kidney disease 02/26/2020  . Benign hypertensive kidney disease with chronic kidney disease 02/26/2020  . Congestive heart failure ([place]) 02/26/2020  . Hypokalemia 02/26/2020  . Secondary hyperparathyroidism of renal origin ([place])  02/26/2020  . Stage 3a chronic kidney disease ([place]) 02/26/2020  . Acute exacerbation of CHF (congestive heart failure) ([place]) 02/01/2020  . Elevated troponin 02/01/2020  . Reflux esophagitis   . CLE (columnar lined esophagus)   . Duodenitis 06/25/2019  . Chronic heart failure with preserved ejection fraction (HFpEF) ([place]) 02/26/2019  . Benign essential HTN 01/17/2019  . Type II diabetes mellitus with renal manifestations ([place]) 07/24/2015  . GERD without esophagitis 07/24/2015  . Hyperlipidemia 07/24/2015    Past Surgical History:  Procedure Laterality Date  . COLONOSCOPY    . COLONOSCOPY WITH PROPOFOL N/A 08/02/2019   Procedure: COLONOSCOPY WITH PROPOFOL;  Surgeon: Virgel Manifold, MD;  Location: [facility] ENDOSCOPY;  Service: Endoscopy;  Laterality: N/A;  . CORONARY ANGIOPLASTY    . ESOPHAGOGASTRODUODENOSCOPY N/A 06/26/2019   Procedure: ESOPHAGOGASTRODUODENOSCOPY (EGD);  Surgeon: Virgel Manifold, MD;  Location: [facility] ENDOSCOPY;  Service: Endoscopy;  Laterality: N/A;  . ESOPHAGOGASTRODUODENOSCOPY (EGD) WITH PROPOFOL N/A 08/02/2019   Procedure: ESOPHAGOGASTRODUODENOSCOPY (EGD) WITH PROPOFOL;  Surgeon: Virgel Manifold, MD;  Location: [facility] ENDOSCOPY;  Service: Endoscopy;  Laterality: N/A;  . ESOPHAGOGASTRODUODENOSCOPY (EGD) WITH PROPOFOL N/A 04/03/2020   Procedure: ESOPHAGOGASTRODUODENOSCOPY (EGD) WITH PROPOFOL;  Surgeon: [name], MD;  Location: [facility] ENDOSCOPY;  Service: Gastroenterology;  Laterality: N/A;  . LEFT HEART CATH AND CORONARY ANGIOGRAPHY N/A 01/23/2019   Procedure: LEFT HEART CATH AND CORONARY ANGIOGRAPHY;  Surgeon: Yolonda Kida, MD;  Location: [facility];  Service: Cardiovascular;  Laterality: N/A;  . ovarian cyst removal  Prior to Admission medications   Medication Sig Start Date End Date Taking? Authorizing Provider  albuterol (VENTOLIN HFA) 108 (90 Base) MCG/ACT inhaler Inhale 2 puffs into the lungs every 6 (six) hours as needed for wheezing.     [provider]  amLODipine (NORVASC) 10 MG tablet Take 1 tablet by mouth daily.    [provider]  aspirin EC 81 MG EC tablet Take 1 tablet (81 mg total) by mouth daily. 06/27/19   Gouru, Illene Silver, MD  atorvastatin (LIPITOR) 40 MG tablet Take 40 mg by mouth daily.  07/04/18   [provider]  calcitRIOL (ROCALTROL) 0.25 MCG capsule Take 0.25 mcg by mouth daily. 11/26/20   [provider]  cetirizine (ZYRTEC) 10 MG tablet Take 10 mg by mouth every other day.     [provider]  glipiZIDE (GLUCOTROL XL) 5 MG 24 hr tablet Take 1 tablet (5 mg total) by mouth daily with breakfast. 01/16/21 02/15/21  Nolberto Hanlon, MD  hydrALAZINE (APRESOLINE) 25 MG tablet Take 25 mg by mouth 2 (two) times daily. 08/25/20   [provider]  isosorbide mononitrate (IMDUR) 120 MG 24 hr tablet Take 1 tablet (120 mg total) by mouth daily. 01/17/21 02/16/21  Nolberto Hanlon, MD  LANTUS SOLOSTAR 100 UNIT/ML Solostar Pen Inject 22 Units into the skin at bedtime. 01/16/21   Nolberto Hanlon, MD  metoprolol succinate (TOPROL-XL) 100 MG 24 hr tablet Take 1 tablet (100 mg total) by mouth daily. Take with or immediately following a meal. 09/22/20   Nicole Kindred A, DO  Multiple Vitamin (MULTIVITAMIN WITH MINERALS) TABS tablet Take 1 tablet by mouth daily.    [provider]  nitroGLYCERIN (NITROSTAT) 0.4 MG SL tablet Place 1 tablet (0.4 mg total) under the tongue every 5 (five) minutes as needed for chest pain. 03/20/20   Ezekiel Slocumb, DO  ranolazine (RANEXA) 1000 MG SR tablet Take 1 tablet (1,000 mg total) by mouth 2 (two) times daily. 01/16/21 02/15/21  Nolberto Hanlon, MD    Allergies Accupril [place] hcl]  Family History  Problem Relation Age of Onset  . Diabetes Neg Hx   . Hypertension Neg Hx   . Breast cancer Neg Hx     Social History Social History   Tobacco Use  . Smoking status: Former Smoker    Packs/day: 1.00    Years: 40.00    Pack years: 40.00    Types:  Cigarettes    Quit date: 07/27/2018    Years since quitting: 2.4  . Smokeless tobacco: Never Used  Vaping Use  . Vaping Use: Never used  Substance Use Topics  . Alcohol use: No  . Drug use: No    Review of Systems Level 5 caveat secondary to cardiac arrest ____________________________________________   PHYSICAL EXAM:  VITAL SIGNS: ED Triage Vitals  Enc Vitals Group     BP --      Pulse Rate 2021-01-25 0653 (!) 0     Resp --      Temp --      Temp src --      SpO2 --      Weight 2021-01-25 0714 188 lb (85.3 kg)     Height 2021-01-25 0714 '5\' 7"'$  (1.702 m)     Head Circumference --      Peak Flow --      Pain Score 01-25-21 0714 Asleep     Pain Loc --      Pain Edu? --  Excl. in GC? --    CONSTITUTIONAL: Elderly, obese, GCS 3 HEAD: Normocephalic EYES: Conjunctivae clear, pupils approximately 5 mm to 6 mm and unreactive ENT: normal nose; moist mucous membranes, King airway in place NECK: Supple, trachea midline CARD: Pulseless, receiving compressions RESP: Being bagged through a King airway with rhonchorous breath sounds bilaterally ABD/GI: Mildly distended BACK: The back appears normal EXT: No peripheral edema noted; left tibial IO SKIN: Cool to touch, no rash on exposed skin NEURO: GCS 3  ____________________________________________   LABS (all labs ordered are listed, but only abnormal results are displayed)  Labs Reviewed  BLOOD GAS, ARTERIAL - Abnormal; Notable for the following components:      Result Value   pH, Arterial 6.90 (*)    pCO2 arterial 66 (*)    pO2, Arterial 116 (*)    Bicarbonate 12.9 (*)    Acid-base deficit 19.0 (*)    All other components within normal limits  URINALYSIS, COMPLETE (UACMP) WITH MICROSCOPIC - Abnormal; Notable for the following components:   Color, Urine YELLOW (*)    APPearance CLEAR (*)    Glucose, UA >=500 (*)    Protein, ur 100 (*)    All other components within normal limits  SARS CORONAVIRUS 2 BY RT PCR (HOSPITAL  ORDER, [facility])  CBC WITH DIFFERENTIAL/PLATELET  COMPREHENSIVE METABOLIC PANEL  BRAIN NATRIURETIC PEPTIDE  LACTIC ACID, PLASMA  LACTIC ACID, PLASMA  MAGNESIUM  TROPONIN I (HIGH SENSITIVITY)   ____________________________________________  EKG   EKG Interpretation  Date/Time:  January 24, 2021 07:11:27 EST Ventricular Rate:  107 PR Interval:    QRS Duration: 166 QT Interval:  366 QTC Calculation: 489 R Axis:   101 Text Interpretation: Sinus tachycardia Borderline prolonged PR interval Right bundle branch block Confirmed by Pryor Curia [phone]) on 2021-01-24 7:44:45 AM       ____________________________________________  RADIOLOGY Jessie Foot Dessie Delcarlo, personally viewed and evaluated these images (plain radiographs) as part of my medical decision making, as well as reviewing the written report by the radiologist.  ED MD interpretation: Chest x-ray shows bilateral airspace opacities.  Official radiology report(s): DG Abdomen 1 View  Result Date: 24-Jan-2021 CLINICAL DATA:  Orogastric tube placement EXAM: ABDOMEN - 1 VIEW COMPARISON:  CT abdomen pelvis 03/31/2020 FINDINGS: OG tube terminates in the left upper quadrant, in the expected location of the stomach. Atherosclerotic calcifications noted in the upper abdominal arteries. Visualized bowel loops are normal in caliber. IMPRESSION: Orogastric tube terminates in the left upper quadrant at the expected site of the stomach. Electronically Signed   By: Miachel Roux M.D.   On: 01/24/2021 07:46   DG Chest Portable 1 View  Result Date: Jan 24, 2021 CLINICAL DATA:  Status post arrest Status post intubation and OG tube placement EXAM: PORTABLE CHEST 1 VIEW COMPARISON:  01/09/2021 FINDINGS: Cardiomediastinal silhouette and pulmonary vasculature are within normal limits. The ET tube tip located 3.3 cm of the carina. Orogastric tube terminates in the left upper quadrant. Bilateral airspace opacities have  improved, particularly at the lung bases. Significant airspace disease still persists. IMPRESSION: 1. Lines and tubes as above. 2. Interval improvement of bilateral airspace opacities, particularly at the lung bases. Electronically Signed   By: Miachel Roux M.D.   On: 2021-01-24 07:45    ____________________________________________   PROCEDURES  Procedure(s) performed (including Critical Care):  Procedures  CRITICAL CARE Performed by: Cyril Mourning Abshir Paolini   Total critical care time: 45 minutes  Critical care time was exclusive of  separately billable procedures and treating other patients.  Critical care was necessary to treat or prevent imminent or life-threatening deterioration.  Critical care was time spent personally by me on the following activities: development of treatment plan with patient and/or surrogate as well as nursing, discussions with consultants, evaluation of patient's response to treatment, examination of patient, obtaining history from patient or surrogate, ordering and performing treatments and interventions, ordering and review of laboratory studies, ordering and review of radiographic studies, pulse oximetry and re-evaluation of patient's condition.  Cardiopulmonary Resuscitation (CPR) Procedure Note Directed/Performed by: Pryor Curia I personally directed ancillary staff and/or performed CPR in an effort to regain return of spontaneous circulation and to maintain cardiac, neuro and systemic perfusion.   INTUBATION Performed by: Pryor Curia  Required items: required blood products, implants, devices, and special equipment available Patient identity confirmed: provided demographic data and hospital-assigned identification number Time out: Immediately prior to procedure a "time out" was called to verify the correct patient, procedure, equipment, support staff and site/side marked as required.  Indications: cardiac arrest  Intubation method: Glidescope Laryngoscopy    Preoxygenation:  [place] Pace airway  Sedatives: None Paralytic: None  Tube Size: 7.5 cuffed  Post-procedure assessment: chest rise and ETCO2 monitor Breath sounds: equal and absent over the epigastrium Tube secured with: ETT holder Chest x-ray interpreted by radiologist and me.  Chest x-ray findings: endotracheal tube in appropriate position  Patient tolerated the procedure well with no immediate complications.    ____________________________________________   INITIAL IMPRESSION / ASSESSMENT AND PLAN / ED COURSE  As part of my medical decision making, I reviewed the following data within the [place] History obtained from family, Nursing notes reviewed and incorporated, Labs reviewed, EKG interpreted tachycardia, Old EKG reviewed, Old chart reviewed, Patient signed out to EDP, A consult was requested and obtained from this/these consultant(s) Critical care and Notes from prior ED visits         Patient here with respiratory distress that progressed to cardiac arrest. Witnessed arrest with EMS. Patient in asystole with EMS and received 3 epinephrines. Patient initially in asystole here but then went into PEA and then we did obtain ROSC after multiple epinephrines, bicarb and calcium. King replaced with ETT.  Critical care team has been consulted by Dr. Jari Pigg. Labs, chest x-ray pending. Differential includes ACS, PE, pneumonia, pneumothorax, CHF exacerbation, infection, electrolyte disturbance, anemia.   Blood glucose normal. EKG shows sinus tachycardia, right bundle branch block.  CXR clear.  ETT in appropriate position.    Patient became bradycardic. Given atropine without relief. Again lost pulses.  In slow PEA. Had ROSC after 1 epinephrine. Blood gas shows mixed metabolic and respiratory acidosis with pH of 6.9. She has already received 2 amps of bicarb. Will give another 3 amps and start on bicarb infusion. Epinephrine infusion has been started.  Patient lost  pulses again. Again in PEA.  ROSC again after 1 round EPI.  Dr. Jari Pigg, [place], assisting with patient and will assume care.  She has talked to family multiple times regarding Code status, goals of care.  I reviewed all nursing notes and pertinent previous records as available.  I have reviewed and interpreted any EKGs, lab and urine results, imaging (as available).  ____________________________________________   FINAL CLINICAL IMPRESSION(S) / ED DIAGNOSES  Final diagnoses:  Cardiac arrest ([place])  Acute respiratory failure with hypoxia and hypercapnia [facility])     ED Discharge Orders    None      *Please note:  Brittney [name] was evaluated in Emergency Department on 26-Jan-2021 for the symptoms described in the history of present illness. She was evaluated in the context of the global COVID-19 pandemic, which necessitated consideration that the patient might be at risk for infection with the SARS-CoV-2 virus that causes COVID-19. Institutional protocols and algorithms that pertain to the evaluation of patients at risk for COVID-19 are in a state of rapid change based on information released by regulatory bodies including the CDC and federal and state organizations. These policies and algorithms were followed during the patient's care in the ED.  Some ED evaluations and interventions may be delayed as a result of limited staffing during and the pandemic.*   Note:  This document was prepared using Dragon voice recognition software and may include unintentional dictation errors.      Evaleen Sant, Delice Bison, DO 01/26/21 2358

## 2021-01-27 NOTE — Code Documentation (Signed)
Pt regained pulses

## 2021-01-27 NOTE — ED Notes (Signed)
PEA at [phone]

## 2021-01-27 NOTE — Progress Notes (Signed)
   2021-02-15 0740  Clinical Encounter Type  Visited With Patient and family together  Visit Type Initial;Spiritual support;Social support  Referral From Nurse  Consult/Referral To Chaplain  Responded to Ed per PG from nurse. I went to the room first. The nurses were working on PT. I found family in waiting room talking to nurse about care of the Pt. Nurse asked Pt family did they want to go to room. I escorted family to the room. After entering the room, I prayed with the family. Ch will follow up.

## 2021-01-27 NOTE — ED Notes (Signed)
1 amp bicarb given

## 2021-01-27 DEATH — deceased
